# Patient Record
Sex: Male | Born: 1970 | Race: White | Hispanic: No | Marital: Single | Smoking: Current every day smoker
Health system: Southern US, Community
[De-identification: ages and names within clinical notes are randomized; demographics above are authoritative.]

## PROBLEM LIST (undated history)

## (undated) DIAGNOSIS — J449 Chronic obstructive pulmonary disease, unspecified: Secondary | ICD-10-CM

## (undated) DIAGNOSIS — I2699 Other pulmonary embolism without acute cor pulmonale: Secondary | ICD-10-CM

## (undated) DIAGNOSIS — G4730 Sleep apnea, unspecified: Secondary | ICD-10-CM

## (undated) DIAGNOSIS — I509 Heart failure, unspecified: Secondary | ICD-10-CM

## (undated) DIAGNOSIS — I82419 Acute embolism and thrombosis of unspecified femoral vein: Secondary | ICD-10-CM

## (undated) DIAGNOSIS — M1990 Unspecified osteoarthritis, unspecified site: Secondary | ICD-10-CM

## (undated) DIAGNOSIS — Z86718 Personal history of other venous thrombosis and embolism: Secondary | ICD-10-CM

## (undated) DIAGNOSIS — I499 Cardiac arrhythmia, unspecified: Secondary | ICD-10-CM

## (undated) HISTORY — DX: Other pulmonary embolism without acute cor pulmonale: I26.99

## (undated) HISTORY — DX: Heart failure, unspecified: I50.9

## (undated) HISTORY — DX: Acute embolism and thrombosis of unspecified femoral vein: I82.419

## (undated) HISTORY — DX: Sleep apnea, unspecified: G47.30

## (undated) HISTORY — PX: NO PAST SURGERIES: SHX2092

## (undated) HISTORY — DX: Cardiac arrhythmia, unspecified: I49.9

## (undated) HISTORY — DX: Chronic obstructive pulmonary disease, unspecified: J44.9

---

## 2018-07-08 ENCOUNTER — Encounter: Payer: Self-pay | Admitting: Emergency Medicine

## 2018-07-08 ENCOUNTER — Emergency Department
Admission: EM | Admit: 2018-07-08 | Discharge: 2018-07-08 | Disposition: A | Discharge status: Home / Self Care | Payer: Self-pay | Attending: Emergency Medicine | Admitting: Emergency Medicine

## 2018-07-08 ENCOUNTER — Other Ambulatory Visit: Payer: Self-pay

## 2018-07-08 ENCOUNTER — Emergency Department: Payer: Self-pay

## 2018-07-08 DIAGNOSIS — F17210 Nicotine dependence, cigarettes, uncomplicated: Secondary | ICD-10-CM | POA: Insufficient documentation

## 2018-07-08 DIAGNOSIS — M25562 Pain in left knee: Secondary | ICD-10-CM | POA: Insufficient documentation

## 2018-07-08 DIAGNOSIS — Z7901 Long term (current) use of anticoagulants: Secondary | ICD-10-CM | POA: Insufficient documentation

## 2018-07-08 MED ORDER — PREDNISONE 10 MG PO TABS: ORAL_TABLET | ORAL | 0 refills | Status: DC

## 2018-07-08 MED ORDER — HYDROCODONE-ACETAMINOPHEN 5-325 MG PO TABS
1.0000 | ORAL_TABLET | Freq: Once | ORAL | Status: AC
  Administered 2018-07-08: 1 via ORAL
  Filled 2018-07-08: qty 1

## 2018-07-08 MED ORDER — HYDROCODONE-ACETAMINOPHEN 5-325 MG PO TABS: 1.0000 | ORAL_TABLET | Freq: Four times a day (QID) | ORAL | 0 refills | Status: DC | PRN

## 2018-07-08 NOTE — Discharge Instructions (Signed)
Follow-up with Dr. Ernest Pine who is the orthopedist on call.  His contact information is listed on your discharge papers. Begin taking prednisone 3 tablets once a day for the next 4 days.  Also Norco 1 every 6 hours as needed for pain.  You may also use ice to your knee as needed for discomfort.  Use crutches for added support and protection to avoid injury to your knee.

## 2018-07-08 NOTE — ED Triage Notes (Signed)
Pt reports that his left knee began bothering him yesterday. He denies any injury to his knee. He states that when he tried to walk on it it feels like it is locking up and is bone on bone. Left knee more swollen than the right.

## 2018-07-08 NOTE — ED Notes (Signed)
See triage note  Presents with left knee pain  States pain started yesterday w/o injury  Pain is mainly to anterior   Min swelling noted

## 2018-07-08 NOTE — ED Provider Notes (Signed)
[facility] Emergency Department Provider Note  ____________________________________________   First MD Initiated Contact with Patient 07/08/18 1118     (approximate)  I have reviewed the triage vital signs and the nursing notes.   HISTORY  Chief Complaint Knee Pain   HPI Matthew [name] is a 47 y.o. male resents to the ED with complaint of left knee pain that began yesterday.  He denies any injury to his knee.  He states that it feels like it is "bone-on-bone" when he is walking.  He denies any other symptoms and states that the pain is more at his kneecap than any other place.  Patient has not taken any over-the-counter medication prior to arrival for his knee pain.  Rates his pain as 6 out of 10.   History reviewed. No pertinent past medical history.  There are no active problems to display for this patient.   History reviewed. No pertinent surgical history.  Prior to Admission medications   Medication Sig Start Date End Date Taking? Authorizing Provider  warfarin (COUMADIN) 5 MG tablet Take 5 mg by mouth daily.   Yes [provider]  HYDROcodone-acetaminophen (NORCO/VICODIN) 5-325 MG tablet Take 1 tablet by mouth every 6 (six) hours as needed for moderate pain. 07/08/18   Tommi Rumps, PA-C  predniSONE (DELTASONE) 10 MG tablet Take 3 tabs once a day for the next 4 days 07/08/18   Tommi Rumps, PA-C    Allergies Patient has no known allergies.  History reviewed. No pertinent family history.  Social History Social History   Tobacco Use  . Smoking status: Current Every Day Smoker    Packs/day: 0.50    Types: Cigarettes  Substance Use Topics  . Alcohol use: Yes    Comment: Occ  . Drug use: Never    Review of Systems Constitutional: No fever/chills Cardiovascular: Denies chest pain. Respiratory: Denies shortness of breath. Musculoskeletal: Positive left knee pain. Skin: Negative for rash. Neurological: Negative for  headaches, focal weakness or numbness. ____________________________________________   PHYSICAL EXAM:  VITAL SIGNS: ED Triage Vitals  Enc Vitals Group     BP 07/08/18 1112 119/69     Pulse Rate 07/08/18 1112 89     Resp 07/08/18 1112 18     Temp 07/08/18 1112 98.7 F (37.1 C)     Temp Source 07/08/18 1112 Oral     SpO2 07/08/18 1112 96 %     Weight 07/08/18 1113 (!) 304 lb (137.9 kg)     Height 07/08/18 1113 5\' 7"  (1.702 m)     Head Circumference --      Peak Flow --      Pain Score 07/08/18 1112 6     Pain Loc --      Pain Edu? --      Excl. in GC? --    Constitutional: Alert and oriented. Well appearing and in no acute distress. Eyes: Conjunctivae are normal.  Head: Atraumatic. Neck: No stridor.   Cardiovascular: Normal rate, regular rhythm. Grossly normal heart sounds.  Good peripheral circulation. Respiratory: Normal respiratory effort.  No retractions. Lungs CTAB. Musculoskeletal: Examination of left knee there is no gross deformity in comparison to the right knee there is no obvious swelling noted.  There is no erythema or warmth noted and Homans sign is negative.  Pulses present.  Skin is warm and dry.  No skin injury or discoloration.  No edema or effusion noted at the patella.  Range of motion is restricted  secondary to patient's pain.  Minimal crepitus is noted. Neurologic:  Normal speech and language. No gross focal neurologic deficits are appreciated.  Skin:  Skin is warm, dry and intact.  Psychiatric: Mood and affect are normal. Speech and behavior are normal.  ____________________________________________   LABS (all labs ordered are listed, but only abnormal results are displayed)  Labs Reviewed - No data to display  RADIOLOGY  ED MD interpretation: Left knee x-ray shows degenerative changes.  Official radiology report(s): Dg Knee Complete 4 Views Left  Result Date: 07/08/2018 CLINICAL DATA:  Left knee pain began yesterday, no acute injury, feels like  the knee is locking EXAM: LEFT KNEE - COMPLETE 4+ VIEW COMPARISON:  None. 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 Medially there is more loss of joint space. No fracture or joint effusion is seen. However, there is chondrocalcinosis present. 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 IMPRESSION: 1. No acute abnormality. 2. 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 3. 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 Electronically Signed   By: Dwyane Dee M.D.   On: 07/08/2018 11:35    ____________________________________________   PROCEDURES  Procedure(s) performed: Patient was given crutches by ED tech.  Procedures  Critical Care performed: No  ____________________________________________   INITIAL IMPRESSION / ASSESSMENT AND PLAN / ED COURSE  As part of my medical decision making, I reviewed the following data within the electronic MEDICAL RECORD NUMBER Notes from prior ED visits and [place] Controlled Substance Database  Patient presents to the ED with complaint of left knee pain that began yesterday.  Patient denies any injury to his knee.  He has not taken any over-the-counter medication for his pain.  Exam is unremarkable.  X-ray reveals degenerative changes with decreased space noted on the medial aspect.  Patient was made aware.  He was given a prescription for Norco for pain and also prednisone for inflammation as he is not to take anti-inflammatories.  He is encouraged to use ice to his knee as needed.  We discussed following up with an orthopedist if he continues to have problems with his knee.  He was given crutches for added support for walking and a note to remain out of work for the next 2  days.  ____________________________________________   FINAL CLINICAL IMPRESSION(S) / ED DIAGNOSES  Final diagnoses:  Acute pain of left knee     ED Discharge Orders         Ordered    HYDROcodone-acetaminophen (NORCO/VICODIN) 5-325 MG tablet  Every 6 hours PRN     07/08/18 1203    predniSONE (DELTASONE) 10 MG tablet     07/08/18 1203           Note:  This document was prepared using Dragon voice recognition software and may include unintentional dictation errors.    Tommi Rumps, PA-C 07/08/18 1250    Nita Sickle, MD 07/09/18 [phone]

## 2018-07-24 ENCOUNTER — Emergency Department
Admission: EM | Admit: 2018-07-24 | Discharge: 2018-07-24 | Disposition: A | Discharge status: Home / Self Care | Payer: Self-pay | Attending: Emergency Medicine | Admitting: Emergency Medicine

## 2018-07-24 ENCOUNTER — Encounter: Payer: Self-pay | Admitting: Emergency Medicine

## 2018-07-24 ENCOUNTER — Emergency Department: Payer: Self-pay

## 2018-07-24 DIAGNOSIS — R0981 Nasal congestion: Secondary | ICD-10-CM | POA: Insufficient documentation

## 2018-07-24 DIAGNOSIS — J40 Bronchitis, not specified as acute or chronic: Secondary | ICD-10-CM | POA: Insufficient documentation

## 2018-07-24 DIAGNOSIS — Z7901 Long term (current) use of anticoagulants: Secondary | ICD-10-CM | POA: Insufficient documentation

## 2018-07-24 DIAGNOSIS — F17210 Nicotine dependence, cigarettes, uncomplicated: Secondary | ICD-10-CM | POA: Insufficient documentation

## 2018-07-24 HISTORY — DX: Personal history of other venous thrombosis and embolism: Z86.718

## 2018-07-24 MED ORDER — GUAIFENESIN-CODEINE 100-10 MG/5ML PO SOLN: 5.0000 mL | Freq: Four times a day (QID) | ORAL | 0 refills | Status: DC | PRN

## 2018-07-24 MED ORDER — METHYLPREDNISOLONE 4 MG PO TBPK: ORAL_TABLET | ORAL | 0 refills | Status: DC

## 2018-07-24 MED ORDER — BENZONATATE 100 MG PO CAPS: 200.0000 mg | ORAL_CAPSULE | Freq: Three times a day (TID) | ORAL | 0 refills | Status: DC | PRN

## 2018-07-24 MED ORDER — HYDROCOD POLST-CPM POLST ER 10-8 MG/5ML PO SUER
5.0000 mL | Freq: Once | ORAL | Status: AC
  Administered 2018-07-24: 5 mL via ORAL
  Filled 2018-07-24: qty 5

## 2018-07-24 NOTE — ED Notes (Signed)
Says cough x 1 week productive yellow sputum.  Says he has a cpap with oxygen at home and has a nebulizer with albuterol.  Says he coughs more when lying down and when heat is on.  No distress now.

## 2018-07-24 NOTE — ED Provider Notes (Signed)
[facility] Emergency Department Provider Note   ____________________________________________   First MD Initiated Contact with Patient 07/24/18 1037     (approximate)  I have reviewed the triage vital signs and the nursing notes.   HISTORY  Chief Complaint Nasal Congestion and Cough    HPI Matthew [name] is a 47 y.o. male patient presents with cough for 1 week.  Patient had nasal and chest congestion.  Patient cough is yellow-greenish in color.  Patient is a hot sleep secondary to increased coughing at night.  Patient denies fever/chills.  Patient denies nausea, vomiting, or diarrhea.  Patient is a mild trans-relief with nebulizer treatment at home.  Past Medical History:  Diagnosis Date  . Hx of blood clots     There are no active problems to display for this patient.   History reviewed. No pertinent surgical history.  Prior to Admission medications   Medication Sig Start Date End Date Taking? Authorizing Provider  albuterol (ACCUNEB) 1.25 MG/3ML nebulizer solution Take 1 ampule by nebulization every 6 (six) hours as needed for wheezing.   Yes [provider]  HYDROcodone-acetaminophen (NORCO/VICODIN) 5-325 MG tablet Take 1 tablet by mouth every 6 (six) hours as needed for moderate pain. 07/08/18  Yes Tommi RumpsSummers, Rhonda L, PA-C  warfarin (COUMADIN) 5 MG tablet Take 5 mg by mouth 2 (two) times daily.    Yes [provider]  benzonatate (TESSALON PERLES) 100 MG capsule Take 2 capsules (200 mg total) by mouth 3 (three) times daily as needed. 07/24/18 07/24/19  Joni ReiningSmith, Ronald K, PA-C  guaiFENesin-codeine 100-10 MG/5ML syrup Take 5 mLs by mouth every 6 (six) hours as needed for cough. 07/24/18   Joni ReiningSmith, Ronald K, PA-C  methylPREDNISolone (MEDROL DOSEPAK) 4 MG TBPK tablet Take Tapered dose as directed 07/24/18   Joni ReiningSmith, Ronald K, PA-C  predniSONE (DELTASONE) 10 MG tablet Take 3 tabs once a day for the next 4 days 07/08/18   Tommi RumpsSummers, Rhonda L,  PA-C    Allergies Patient has no known allergies.  No family history on file.  Social History Social History   Tobacco Use  . Smoking status: Current Every Day Smoker    Packs/day: 0.50    Types: Cigarettes  Substance Use Topics  . Alcohol use: Yes    Comment: Occ  . Drug use: Never    Review of Systems Constitutional: No fever/chills Eyes: No visual changes. ENT: Nasal congestion. Cardiovascular: Denies chest pain. Respiratory: Denies shortness of breath.  Productive cough. Gastrointestinal: No abdominal pain.  No nausea, no vomiting.  No diarrhea.  No constipation. Genitourinary: Negative for dysuria. Musculoskeletal: Negative for back pain. Skin: Negative for rash. Neurological: Negative for headaches, focal weakness or numbness.   ____________________________________________   PHYSICAL EXAM:  VITAL SIGNS: ED Triage Vitals  Enc Vitals Group     BP 07/24/18 1032 104/60     Pulse Rate 07/24/18 1032 97     Resp --      Temp 07/24/18 1032 98.7 F (37.1 C)     Temp Source 07/24/18 1032 Oral     SpO2 07/24/18 1032 91 %     Weight 07/24/18 1029 (!) 309 lb (140.2 kg)     Height 07/24/18 1029 5\' 7"  (1.702 m)     Head Circumference --      Peak Flow --      Pain Score 07/24/18 1029 0     Pain Loc --      Pain Edu? --  Excl. in GC? --    Constitutional: Alert and oriented. Well appearing and in no acute distress.  Morbid obesity. Nose: Edematous nasal turbinates with thick rhinorrhea.   Mouth/Throat: Mucous membranes are moist.  Oropharynx non-erythematous.  Postnasal drainage. Neck: No stridor.  Hematological/Lymphatic/Immunilogical: No cervical lymphadenopathy. Cardiovascular: Normal rate, regular rhythm. Grossly normal heart sounds.  Good peripheral circulation. Respiratory: Normal respiratory effort.  No retractions. Lungs with rales and wheezing. Skin:  Skin is warm, dry and intact. No rash noted. Psychiatric: Mood and affect are normal. Speech and  behavior are normal.  ____________________________________________   LABS (all labs ordered are listed, but only abnormal results are displayed)  Labs Reviewed - No data to display ____________________________________________  EKG   ____________________________________________  RADIOLOGY  ED MD interpretation:    Official radiology report(s): Dg Chest 2 View  Result Date: 07/24/2018 CLINICAL DATA:  Cough and chest congestion over the last week. EXAM: CHEST - 2 VIEW COMPARISON:  None. FINDINGS: Heart and mediastinal shadows are normal. There is bronchial thickening. No infiltrate, collapse or effusion. No significant bone finding. IMPRESSION: Bronchitis pattern.  No consolidation or collapse. Electronically Signed   By: Paulina Fusi M.D.   On: 07/24/2018 11:12    ____________________________________________   PROCEDURES  Procedure(s) performed: None  Procedures  Critical Care performed: No  ____________________________________________   INITIAL IMPRESSION / ASSESSMENT AND PLAN / ED COURSE  As part of my medical decision making, I reviewed the following data within the electronic MEDICAL RECORD NUMBER    Patient presents with 1 week of productive cough.  Discussed chest x-ray finding with patient consistent with bronchitis.  Patient given discharge care instruction advised take medication as directed.  Patient advised to establish care with the [facility].      ____________________________________________   FINAL CLINICAL IMPRESSION(S) / ED DIAGNOSES  Final diagnoses:  Bronchitis     ED Discharge Orders         Ordered    benzonatate (TESSALON PERLES) 100 MG capsule  3 times daily PRN     07/24/18 1134    methylPREDNISolone (MEDROL DOSEPAK) 4 MG TBPK tablet     07/24/18 1134    guaiFENesin-codeine 100-10 MG/5ML syrup  Every 6 hours PRN     07/24/18 1134           Note:  This document was prepared using Dragon voice recognition software  and may include unintentional dictation errors.    Joni Reining, PA-C 07/24/18 1138    Governor Rooks, MD 07/24/18 [phone]

## 2018-07-24 NOTE — ED Triage Notes (Signed)
Pt reports a head cold for about a week. Pt reports cough and congestion. Pt reports cough is productive with yellow-green phlem. Pt reports hard to get sleep due to cough.

## 2018-07-28 ENCOUNTER — Encounter: Payer: Self-pay | Admitting: Emergency Medicine

## 2018-07-28 ENCOUNTER — Emergency Department: Payer: Self-pay

## 2018-07-28 ENCOUNTER — Other Ambulatory Visit: Payer: Self-pay

## 2018-07-28 ENCOUNTER — Emergency Department
Admission: EM | Admit: 2018-07-28 | Discharge: 2018-07-28 | Disposition: A | Discharge status: Home / Self Care | Payer: Self-pay | Attending: Emergency Medicine | Admitting: Emergency Medicine

## 2018-07-28 DIAGNOSIS — R5383 Other fatigue: Secondary | ICD-10-CM | POA: Insufficient documentation

## 2018-07-28 DIAGNOSIS — R059 Cough, unspecified: Secondary | ICD-10-CM

## 2018-07-28 DIAGNOSIS — R05 Cough: Secondary | ICD-10-CM | POA: Insufficient documentation

## 2018-07-28 DIAGNOSIS — F17210 Nicotine dependence, cigarettes, uncomplicated: Secondary | ICD-10-CM | POA: Insufficient documentation

## 2018-07-28 DIAGNOSIS — Z7901 Long term (current) use of anticoagulants: Secondary | ICD-10-CM | POA: Insufficient documentation

## 2018-07-28 LAB — URINALYSIS, COMPLETE (UACMP) WITH MICROSCOPIC
BACTERIA UA: NONE SEEN
BILIRUBIN URINE: NEGATIVE
Glucose, UA: NEGATIVE mg/dL
Hgb urine dipstick: NEGATIVE
Ketones, ur: NEGATIVE mg/dL
Leukocytes, UA: NEGATIVE
NITRITE: NEGATIVE
Protein, ur: NEGATIVE mg/dL
SPECIFIC GRAVITY, URINE: 1.006 (ref 1.005–1.030)
pH: 6 (ref 5.0–8.0)

## 2018-07-28 LAB — CBC WITH DIFFERENTIAL/PLATELET
Abs Immature Granulocytes: 0.06 10*3/uL (ref 0.00–0.07)
BASOS ABS: 0 10*3/uL (ref 0.0–0.1)
Basophils Relative: 0 %
EOS ABS: 0.2 10*3/uL (ref 0.0–0.5)
EOS PCT: 2 %
HCT: 52.4 % — ABNORMAL HIGH (ref 39.0–52.0)
HEMOGLOBIN: 17 g/dL (ref 13.0–17.0)
Immature Granulocytes: 1 %
LYMPHS ABS: 3.3 10*3/uL (ref 0.7–4.0)
LYMPHS PCT: 32 %
MCH: 32.6 pg (ref 26.0–34.0)
MCHC: 32.4 g/dL (ref 30.0–36.0)
MCV: 100.4 fL — ABNORMAL HIGH (ref 80.0–100.0)
Monocytes Absolute: 1 10*3/uL (ref 0.1–1.0)
Monocytes Relative: 9 %
Neutro Abs: 6 10*3/uL (ref 1.7–7.7)
Neutrophils Relative %: 56 %
Platelets: 205 10*3/uL (ref 150–400)
RBC: 5.22 MIL/uL (ref 4.22–5.81)
RDW: 15.3 % (ref 11.5–15.5)
WBC: 10.5 10*3/uL (ref 4.0–10.5)
nRBC: 0.2 % (ref 0.0–0.2)

## 2018-07-28 LAB — COMPREHENSIVE METABOLIC PANEL
ALBUMIN: 3.1 g/dL — AB (ref 3.5–5.0)
ALT: 17 U/L (ref 0–44)
AST: 23 U/L (ref 15–41)
Alkaline Phosphatase: 126 U/L (ref 38–126)
Anion gap: 5 (ref 5–15)
BUN: 11 mg/dL (ref 6–20)
CALCIUM: 8.3 mg/dL — AB (ref 8.9–10.3)
CHLORIDE: 102 mmol/L (ref 98–111)
CO2: 35 mmol/L — AB (ref 22–32)
CREATININE: 0.7 mg/dL (ref 0.61–1.24)
GFR calc non Af Amer: >60 mL/min (ref >60)
GLUCOSE: 102 mg/dL — AB (ref 70–99)
Potassium: 5 mmol/L (ref 3.5–5.1)
SODIUM: 142 mmol/L (ref 135–145)
Total Bilirubin: 0.4 mg/dL (ref 0.3–1.2)
Total Protein: 7.6 g/dL (ref 6.5–8.1)

## 2018-07-28 MED ORDER — BENZONATATE 100 MG PO CAPS: 100.0000 mg | ORAL_CAPSULE | Freq: Four times a day (QID) | ORAL | 0 refills | Status: DC | PRN

## 2018-07-28 MED ORDER — BENZONATATE 100 MG PO CAPS
100.0000 mg | ORAL_CAPSULE | Freq: Once | ORAL | Status: AC
  Administered 2018-07-28: 100 mg via ORAL
  Filled 2018-07-28 (×2): qty 1

## 2018-07-28 NOTE — ED Notes (Signed)
Pharmacy to send up medication in about 5 minutes

## 2018-07-28 NOTE — ED Triage Notes (Signed)
Pt in via POV, reports fatigue x approximately 2 weeks, reports being seen here, dx with Bronchitis, prescribed Zpack without any relief.  Pt with low grade fever, other vitals WDL.  NAD noted at this time.

## 2018-07-28 NOTE — Discharge Instructions (Addendum)
Please seek medical attention for any high fevers, chest pain, shortness of breath, change in behavior, persistent vomiting, bloody stool or any other new or concerning symptoms.  

## 2018-07-28 NOTE — ED Notes (Signed)
Pt presents to ED via POV, pt states was seen 2 weeks ago for same, prescribed cough medicine without relief. Pt states took several nebs last night without relief. Pt c/o fatigue, cough, coughing so much he vomits. Pt with strong productive cough noted during assessment.

## 2018-07-28 NOTE — ED Provider Notes (Signed)
[facility] Emergency Department Provider Note   ____________________________________________   I have reviewed the triage vital signs and the nursing notes.   HISTORY  Chief Complaint Cough  History limited by: Not Limited   HPI Matthew [name] is a 47 y.o. male who presents to the emergency department today because of concerns for continued cough, fatigue.  Patient was recently seen in the emergency department and diagnosed with URI.  He states that since leaving the emergency department he has not felt any better.  He has been taking the Z-Pak as prescribed.  Was not able to get cough medication filled.  Patient states that he does get some back pain with his cough.  He has been trying breathing treatments at home with some relief when he breaks up the congestion.  Patient denies any fevers.   Per medical record review patient has a history of recent ER visit with CXR consistent with bronchitis.   Past Medical History:  Diagnosis Date  . Hx of blood clots     There are no active problems to display for this patient.   History reviewed. No pertinent surgical history.  Prior to Admission medications   Medication Sig Start Date End Date Taking? Authorizing Provider  albuterol (ACCUNEB) 1.25 MG/3ML nebulizer solution Take 1 ampule by nebulization every 6 (six) hours as needed for wheezing.    [provider]  benzonatate (TESSALON PERLES) 100 MG capsule Take 2 capsules (200 mg total) by mouth 3 (three) times daily as needed. 07/24/18 07/24/19  Joni Reining, PA-C  guaiFENesin-codeine 100-10 MG/5ML syrup Take 5 mLs by mouth every 6 (six) hours as needed for cough. 07/24/18   Joni Reining, PA-C  HYDROcodone-acetaminophen (NORCO/VICODIN) 5-325 MG tablet Take 1 tablet by mouth every 6 (six) hours as needed for moderate pain. 07/08/18   Tommi Rumps, PA-C  methylPREDNISolone (MEDROL DOSEPAK) 4 MG TBPK tablet Take Tapered dose as directed  07/24/18   Joni Reining, PA-C  predniSONE (DELTASONE) 10 MG tablet Take 3 tabs once a day for the next 4 days 07/08/18   Tommi Rumps, PA-C  warfarin (COUMADIN) 5 MG tablet Take 5 mg by mouth 2 (two) times daily.     [provider]    Allergies Patient has no known allergies.  No family history on file.  Social History Social History   Tobacco Use  . Smoking status: Current Every Day Smoker    Packs/day: 0.50    Types: Cigarettes  . Smokeless tobacco: Never Used  Substance Use Topics  . Alcohol use: Yes    Comment: occassional  . Drug use: Never    Review of Systems Constitutional: No fever/chills Eyes: No visual changes. ENT: No sore throat. Cardiovascular: Positive chest pain. Respiratory: Positive shortness of breath. Gastrointestinal: No abdominal pain.  No nausea, no vomiting.  No diarrhea.   Genitourinary: Negative for dysuria. Musculoskeletal: Positive for back pain. Skin: Negative for rash. Neurological: Negative for headaches, focal weakness or numbness.  ____________________________________________   PHYSICAL EXAM:  VITAL SIGNS: ED Triage Vitals  Enc Vitals Group     BP 07/28/18 1654 122/86     Pulse Rate 07/28/18 1654 97     Resp --      Temp 07/28/18 1654 99.7 F (37.6 C)     Temp Source 07/28/18 1654 Oral     SpO2 07/28/18 1654 91 %     Weight 07/28/18 1655 (!) 305 lb (138.3 kg)  Height 07/28/18 1655 5\' 7"  (1.702 m)     Head Circumference --      Peak Flow --      Pain Score 07/28/18 1659 6   Constitutional: Alert and oriented.  Eyes: Conjunctivae are normal.  ENT      Head: Normocephalic and atraumatic.      Nose: No congestion/rhinnorhea.      Mouth/Throat: Mucous membranes are moist.      Neck: No stridor. Hematological/Lymphatic/Immunilogical: No cervical lymphadenopathy. Cardiovascular: Normal rate, regular rhythm.  No murmurs, rubs, or gallops. Respiratory: Normal respiratory effort without tachypnea nor  retractions. Diffuse expiratory wheezing.  Gastrointestinal: Soft and non tender. No rebound. No guarding.  Genitourinary: Deferred Musculoskeletal: Normal range of motion in all extremities. No lower extremity edema. Neurologic:  Normal speech and language. No gross focal neurologic deficits are appreciated.  Skin:  Skin is warm, dry and intact. No rash noted. Psychiatric: Mood and affect are normal. Speech and behavior are normal. Patient exhibits appropriate insight and judgment.  ____________________________________________    LABS (pertinent positives/negatives)  CMP na 142, k 5.0, glu 102, cr 0.70, ca 8.3 UA hazy otherwise unremarkable CBC wbc 10.5, hgb 17.0, plt 205 ____________________________________________   EKG  None  ____________________________________________    RADIOLOGY  CXR Scattered diffuse peribronchial thickening.  ____________________________________________   PROCEDURES  Procedures  ____________________________________________   INITIAL IMPRESSION / ASSESSMENT AND PLAN / ED COURSE  Pertinent labs & imaging results that were available during my care of the patient were reviewed by me and considered in my medical decision making (see chart for details).   Patient presented to the emergency department today because of concerns for continued cough and some fatigue.  Patient with recent ER visit with diagnosis of bronchitis.  On exam today patient does have diffuse wheezing.  Chest x-ray without any obvious pneumonia.  Did discuss bronchitis with the patient.  Will give patient a prescription for cough medication.  Patient states he has prednisone at home.  I did discuss with patient that he could try taking it for the next couple of days.  Will give patient primary care follow-up information.   ____________________________________________   FINAL CLINICAL IMPRESSION(S) / ED DIAGNOSES  Final diagnoses:  Cough  Other fatigue     Note: This  dictation was prepared with Dragon dictation. Any transcriptional errors that result from this process are unintentional     Phineas SemenGoodman, Yasmyn Bellisario, MD 07/28/18 [phone]

## 2018-08-05 ENCOUNTER — Ambulatory Visit: Payer: Self-pay | Admitting: Pharmacy Technician

## 2018-08-05 DIAGNOSIS — Z79899 Other long term (current) drug therapy: Secondary | ICD-10-CM

## 2018-08-05 NOTE — Progress Notes (Signed)
Called patient.  Completed [facility] application and read contract.  Patient agreed to all terms of the [facility] contract.  Mailing to patient to sign and date and [facility].  Patient to provide SSI statement from [place].  Provided patient with Community education officercommunity resource material based on his particular needs.    [facility]  Sherilyn DacostaBetty J. Zaydon Kinser Care Manager [facility]

## 2018-08-21 ENCOUNTER — Ambulatory Visit: Payer: Self-pay | Admitting: Gerontology

## 2018-08-21 ENCOUNTER — Encounter: Payer: Self-pay | Admitting: Gerontology

## 2018-08-21 VITALS — BP 122/82 | HR 74 | Wt 304.0 lb

## 2018-08-21 DIAGNOSIS — M79671 Pain in right foot: Secondary | ICD-10-CM

## 2018-08-21 DIAGNOSIS — Z0000 Encounter for general adult medical examination without abnormal findings: Secondary | ICD-10-CM

## 2018-08-21 DIAGNOSIS — I825Y2 Chronic embolism and thrombosis of unspecified deep veins of left proximal lower extremity: Secondary | ICD-10-CM

## 2018-08-21 DIAGNOSIS — M79672 Pain in left foot: Secondary | ICD-10-CM

## 2018-08-21 LAB — POCT INR: INR: 1.2 — AB (ref 2.0–3.0)

## 2018-08-21 MED ORDER — WARFARIN SODIUM 5 MG PO TABS: 5.0000 mg | ORAL_TABLET | Freq: Two times a day (BID) | ORAL | 0 refills | Status: DC

## 2018-08-21 NOTE — Progress Notes (Signed)
Patient: Matthew [name] Male    DOB: [birth year]   47 y.o.   MRN: [id number] Visit Date: 08/21/2018  Today's Provider: Langston Reusing, NP   Chief Complaint  Patient presents with  . New Patient (Initial Visit)    establish primary care   Subjective:    HPI Matthew [name] 47 y/o male presents for initial office visit and discuss about his history of DVT to left lower leg. He stated that he was diagnosed with PE and left lower leg DVT in [place] in 2018. He reports taking 5 mg of coumadin twice daily, and his INR has not being checked in 3 months. He denies hematuria, bruises or dark tarry stool. He reports edema to left lower leg, denies claudication, chest pain, palpitation,shortness of breath. He reports smoking 1 pack of cigarette in 2 days. He admits to being diagnosed with sleep apnea and uses Cpap with 3 L of oxygen at bedtime.  He c/o 6/10 dull intermittent pain to bilateral heel with walking, that has being going on for 4 months. He reports that wearing poor fitting shoes might worsens the heel pain.    No Known Allergies Previous Medications   ALBUTEROL (ACCUNEB) 1.25 MG/3ML NEBULIZER SOLUTION    Take 1 ampule by nebulization every 6 (six) hours as needed for wheezing.   BENZONATATE (TESSALON PERLES) 100 MG CAPSULE    Take 2 capsules (200 mg total) by mouth 3 (three) times daily as needed.   BENZONATATE (TESSALON PERLES) 100 MG CAPSULE    Take 1 capsule (100 mg total) by mouth every 6 (six) hours as needed for cough.   GUAIFENESIN-CODEINE 100-10 MG/5ML SYRUP    Take 5 mLs by mouth every 6 (six) hours as needed for cough.   HYDROCODONE-ACETAMINOPHEN (NORCO/VICODIN) 5-325 MG TABLET    Take 1 tablet by mouth every 6 (six) hours as needed for moderate pain.   METHYLPREDNISOLONE (MEDROL DOSEPAK) 4 MG TBPK TABLET    Take Tapered dose as directed   PREDNISONE (DELTASONE) 10 MG TABLET    Take 3 tabs once a day for the next 4 days    Review of Systems  Constitutional:  Negative.   HENT: Negative.   Eyes: Negative.   Respiratory: Negative.   Cardiovascular: Leg swelling: left leg.  Gastrointestinal: Negative.   Endocrine: Negative.   Genitourinary: Negative.   Musculoskeletal: Arthralgias: heel pain.  Skin: Negative.   Neurological: Negative.   Hematological: Negative.   Psychiatric/Behavioral: Negative.     Social History   Tobacco Use  . Smoking status: Current Every Day Smoker    Packs/day: 0.50    Types: Cigarettes  . Smokeless tobacco: Never Used  Substance Use Topics  . Alcohol use: Yes    Comment: occassional   Objective:   BP 122/82 (BP Location: Left Wrist, Patient Position: Sitting)   Pulse 74   Wt (!) 304 lb (137.9 kg)   SpO2 94%   BMI 47.61 kg/m   Physical Exam Constitutional:      Appearance: He is obese.  HENT:     Head: Normocephalic and atraumatic.     Mouth/Throat:     Mouth: Mucous membranes are moist.  Eyes:     Extraocular Movements: Extraocular movements intact.     Pupils: Pupils are equal, round, and reactive to light.  Neck:     Musculoskeletal: Normal range of motion.  Cardiovascular:     Rate and Rhythm: Normal rate and regular rhythm.     Pulses: Normal pulses.  Heart sounds: Normal heart sounds.  Pulmonary:     Effort: Pulmonary effort is normal.     Breath sounds: Normal breath sounds.  Abdominal:     General: Bowel sounds are normal.  Musculoskeletal: Normal range of motion.     Left lower leg: Left lower leg edema: +1 edema left lower leg.  Skin:    General: Skin is warm and dry.  Neurological:     General: No focal deficit present.     Mental Status: He is alert and oriented to person, place, and time.  Psychiatric:        Mood and Affect: Mood normal.        Behavior: Behavior normal.        Thought Content: Thought content normal.        Judgment: Judgment normal.         Assessment & Plan:         1. 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 Morbid obesity ([place]) - Advised on low carb diet, exercise 30 minute daily.  3. Heel pain, bilateral - He was advised to wear proper fitting shoes  4. Health care maintenance  - CBC w/Diff; Future - Comp Met (CMET); Future - Lipid Profile; Future - TSH; Future - HgB A1c; Future - Urinalysis; Future - Follow up in 1 week    [place], NP   [facility]

## 2018-08-28 ENCOUNTER — Emergency Department: Payer: Self-pay

## 2018-08-28 ENCOUNTER — Other Ambulatory Visit: Payer: Self-pay

## 2018-08-28 ENCOUNTER — Encounter: Payer: Self-pay | Admitting: Emergency Medicine

## 2018-08-28 ENCOUNTER — Emergency Department
Admission: EM | Admit: 2018-08-28 | Discharge: 2018-08-28 | Disposition: A | Discharge status: Home / Self Care | Payer: Self-pay | Attending: Emergency Medicine | Admitting: Emergency Medicine

## 2018-08-28 ENCOUNTER — Ambulatory Visit: Payer: Self-pay | Admitting: Gerontology

## 2018-08-28 DIAGNOSIS — I82432 Acute embolism and thrombosis of left popliteal vein: Secondary | ICD-10-CM | POA: Insufficient documentation

## 2018-08-28 DIAGNOSIS — R0902 Hypoxemia: Secondary | ICD-10-CM | POA: Insufficient documentation

## 2018-08-28 DIAGNOSIS — I82402 Acute embolism and thrombosis of unspecified deep veins of left lower extremity: Secondary | ICD-10-CM

## 2018-08-28 DIAGNOSIS — F17210 Nicotine dependence, cigarettes, uncomplicated: Secondary | ICD-10-CM | POA: Insufficient documentation

## 2018-08-28 LAB — CBC WITH DIFFERENTIAL/PLATELET
Abs Immature Granulocytes: 0.03 10*3/uL (ref 0.00–0.07)
Basophils Absolute: 0.1 10*3/uL (ref 0.0–0.1)
Basophils Relative: 1 %
EOS PCT: 2 %
Eosinophils Absolute: 0.2 10*3/uL (ref 0.0–0.5)
HEMATOCRIT: 52.9 % — AB (ref 39.0–52.0)
HEMOGLOBIN: 17.1 g/dL — AB (ref 13.0–17.0)
Immature Granulocytes: 0 %
LYMPHS ABS: 3.1 10*3/uL (ref 0.7–4.0)
LYMPHS PCT: 31 %
MCH: 32 pg (ref 26.0–34.0)
MCHC: 32.3 g/dL (ref 30.0–36.0)
MCV: 98.9 fL (ref 80.0–100.0)
MONOS PCT: 7 %
Monocytes Absolute: 0.7 10*3/uL (ref 0.1–1.0)
Neutro Abs: 6 10*3/uL (ref 1.7–7.7)
Neutrophils Relative %: 59 %
Platelets: 194 10*3/uL (ref 150–400)
RBC: 5.35 MIL/uL (ref 4.22–5.81)
RDW: 14.5 % (ref 11.5–15.5)
WBC: 10.1 10*3/uL (ref 4.0–10.5)
nRBC: 0 % (ref 0.0–0.2)

## 2018-08-28 LAB — COMPREHENSIVE METABOLIC PANEL
ALBUMIN: 3.5 g/dL (ref 3.5–5.0)
ALK PHOS: 101 U/L (ref 38–126)
ALT: 13 U/L (ref 0–44)
ANION GAP: 7 (ref 5–15)
AST: 23 U/L (ref 15–41)
BILIRUBIN TOTAL: 0.8 mg/dL (ref 0.3–1.2)
BUN: 9 mg/dL (ref 6–20)
CALCIUM: 8.6 mg/dL — AB (ref 8.9–10.3)
CO2: 30 mmol/L (ref 22–32)
Chloride: 100 mmol/L (ref 98–111)
Creatinine, Ser: 0.6 mg/dL — ABNORMAL LOW (ref 0.61–1.24)
GFR calc Af Amer: >60 mL/min (ref >60)
GFR calc non Af Amer: >60 mL/min (ref >60)
GLUCOSE: 108 mg/dL — AB (ref 70–99)
Potassium: 4.2 mmol/L (ref 3.5–5.1)
Sodium: 137 mmol/L (ref 135–145)
TOTAL PROTEIN: 8.1 g/dL (ref 6.5–8.1)

## 2018-08-28 LAB — PROTIME-INR
INR: 1.05
Prothrombin Time: 13.6 seconds (ref 11.4–15.2)

## 2018-08-28 MED ORDER — ENOXAPARIN SODIUM 30 MG/0.3ML ~~LOC~~ SOLN: 1.0000 mg/kg | Freq: Two times a day (BID) | SUBCUTANEOUS | 0 refills | Status: DC

## 2018-08-28 MED ORDER — IOPAMIDOL (ISOVUE-370) INJECTION 76%
75.0000 mL | Freq: Once | INTRAVENOUS | Status: AC | PRN
  Administered 2018-08-28: 75 mL via INTRAVENOUS

## 2018-08-28 MED ORDER — WARFARIN SODIUM 5 MG PO TABS: 5.0000 mg | ORAL_TABLET | Freq: Every day | ORAL | 11 refills | Status: DC

## 2018-08-28 MED ORDER — WARFARIN SODIUM 5 MG PO TABS
5.0000 mg | ORAL_TABLET | Freq: Once | ORAL | Status: AC
  Administered 2018-08-28: 5 mg via ORAL
  Filled 2018-08-28: qty 1

## 2018-08-28 MED ORDER — WARFARIN - PHYSICIAN DOSING INPATIENT
Freq: Every day | Status: DC
  Filled 2018-08-28: qty 1

## 2018-08-28 MED ORDER — ENOXAPARIN SODIUM 150 MG/ML ~~LOC~~ SOLN
1.0000 mg/kg | Freq: Once | SUBCUTANEOUS | Status: AC
  Administered 2018-08-28: 140 mg via SUBCUTANEOUS
  Filled 2018-08-28: qty 0.92

## 2018-08-28 MED ORDER — HYDROMORPHONE HCL 1 MG/ML IJ SOLN
1.0000 mg | Freq: Once | INTRAMUSCULAR | Status: AC
  Administered 2018-08-28: 1 mg via INTRAMUSCULAR
  Filled 2018-08-28: qty 1

## 2018-08-28 NOTE — ED Notes (Signed)
Pharmacy contacted to send warfarin to ed

## 2018-08-28 NOTE — ED Provider Notes (Addendum)
[facility] Emergency Department Provider Note       Time seen: ----------------------------------------- 10:30 AM on 08/28/2018 -----------------------------------------   I have reviewed the triage vital signs and the nursing notes.  HISTORY   Chief Complaint Leg Pain    HPI Matthew [name] is a 47 y.o. male with a history of blood clots and sleep apnea who presents to the ED for left buttock and hamstring pain.  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  He does a lot of squatting and lifting on his job, denies any fevers, chills or other complaints.  Patient is concerned it is a blood clot.  Past Medical History:  Diagnosis Date  . Hx of blood clots   . Sleep apnea     There are no active problems to display for this patient.   History reviewed. No pertinent surgical history.  Allergies Patient has no known allergies.  Social History Social History   Tobacco Use  . Smoking status: Current Every Day Smoker    Packs/day: 0.50    Types: Cigarettes  . Smokeless tobacco: Never Used  Substance Use Topics  . Alcohol use: Yes    Comment: occassional  . Drug use: Never   Review of Systems Constitutional: Negative for fever. Cardiovascular: Negative for chest pain. Respiratory: Negative for shortness of breath. Gastrointestinal: Negative for abdominal pain, vomiting and diarrhea. Musculoskeletal: Positive for left leg pain Skin: Negative for rash. Neurological: Negative for headaches, focal weakness or numbness.  All systems negative/normal/unremarkable except as stated in the HPI  ____________________________________________   PHYSICAL EXAM:  VITAL SIGNS: ED Triage Vitals  Enc Vitals Group     BP 08/28/18 1010 111/69     Pulse Rate 08/28/18 1010 82     Resp 08/28/18 1010 16     Temp 08/28/18 1010 98.1 F (36.7 C)     Temp Source 08/28/18 1010 Oral   SpO2 08/28/18 1010 96 %     Weight 08/28/18 1011 (!) 304 lb 0.2 oz (137.9 kg)     Height --      Head Circumference --      Peak Flow --      Pain Score 08/28/18 1010 10     Pain Loc --      Pain Edu? --      Excl. in GC? --    Constitutional: Alert and oriented. Well appearing and in no distress. Musculoskeletal: Mild tenderness in the inferior buttock region on the left leg and upper hamstring area.  Pain with range of motion of the left lower extremity Neurologic:  Normal speech and language. No gross focal neurologic deficits are appreciated.  Skin:  Skin is warm, dry and intact. No rash noted. Psychiatric: Mood and affect are normal. Speech and behavior are normal.  ____________________________________________  ED COURSE:  As part of my medical decision making, I reviewed the following data within the electronic MEDICAL RECORD NUMBER History obtained from family if available, nursing notes, old chart and ekg, as well as notes from prior ED visits. Patient presented for left leg pain, we will assess with labs and imaging as indicated at this time.   Procedures ____________________________________________   RADIOLOGY Images were viewed by me  Left lower extremity ultrasound IMPRESSION: Positive for thrombus localized to the left popliteal vein. Although some of this thrombus may be chronic based on echogenic appearance, there is felt to likely be  an acute component of thrombus as well. No other thrombus is identified. The calf veins are not adequately assessed on this study due to poor visualization. CTA chest IMPRESSION: No evidence of pulmonary embolus.  Dilated central pulmonary arteries compatible with pulmonary arterial hypertension.  No acute cardiopulmonary disease.  ____________________________________________  DIFFERENTIAL DIAGNOSIS   Muscle strain, DVT, cellulitis  FINAL ASSESSMENT AND PLAN  Muscle strain, DVT acute on chronic   Plan: The patient  had presented for left leg pain. Patient's imaging possibly appeared acute on chronic thrombus.  Patient was given a shot of Lovenox here and an extra dose of Coumadin as his INR is severely subtherapeutic.  Will place him on Lovenox for a week until he is therapeutic on his Coumadin.  Patient did have some intermittent hypoxia of uncertain etiology.  CT was negative for PE.  He may have pulmonary artery hypertension.   Ulice DashJohnathan E Williams, MD   Note: This note was generated in part or whole with voice recognition software. Voice recognition is usually quite accurate but there are transcription errors that can and very often do occur. I apologize for any typographical errors that were not detected and corrected.     Emily FilbertWilliams, Jonathan E, MD 08/28/18 1356    Emily FilbertWilliams, Jonathan E, MD 08/28/18 [phone]

## 2018-08-28 NOTE — ED Notes (Signed)
Pt cleared by MD to have po fluids. Pt given water by RN

## 2018-08-28 NOTE — ED Triage Notes (Addendum)
Says pain behind left thigh that increases with any movement since yesterday.  Denies injury.  Says he is concerned it is a blood clot.

## 2018-08-28 NOTE — ED Notes (Signed)
Pt 02 declining down into the 80's. Pt does not want oxygen applied and states he feels fine and does not need it

## 2018-08-28 NOTE — ED Notes (Signed)
First Nurse Note: [facility], ambulatory at the scene.  BP 124/69, HR 85, Ox sat in the 90's.  Hx of blood clots in legs and pulmonary - last episode 2 years ago.  Alert, sitting upright in WC.  EMS states he called EMS because he would have pain with bending his leg in POV.

## 2018-09-01 ENCOUNTER — Telehealth: Payer: Self-pay | Admitting: Pharmacy Technician

## 2018-09-01 NOTE — Telephone Encounter (Signed)
Patient approved to receive medication assistance at [facility] as long as eligibility criteria continues to be met.  [facility]

## 2018-09-09 ENCOUNTER — Ambulatory Visit: Payer: Self-pay | Admitting: Gerontology

## 2018-09-10 ENCOUNTER — Other Ambulatory Visit: Payer: Self-pay

## 2018-09-10 DIAGNOSIS — I825Y2 Chronic embolism and thrombosis of unspecified deep veins of left proximal lower extremity: Secondary | ICD-10-CM

## 2018-09-10 DIAGNOSIS — Z0000 Encounter for general adult medical examination without abnormal findings: Secondary | ICD-10-CM

## 2018-09-10 LAB — POCT INR: INR: 2.3 (ref 2.0–3.0)

## 2018-09-10 NOTE — Progress Notes (Signed)
Matthew [name] stated that he didn't take Lovenox injection that was prescribed during his visit to the ED on 08/29/2019 for dvt. He was advised to take an extra dose of 5 mg of coumadin once, and PT/INR recheck on 09/17/2018.

## 2018-09-11 LAB — COMPREHENSIVE METABOLIC PANEL
ALK PHOS: 160 IU/L — AB (ref 39–117)
ALT: 13 IU/L (ref 0–44)
AST: 17 IU/L (ref 0–40)
Albumin/Globulin Ratio: 1 — ABNORMAL LOW (ref 1.2–2.2)
Albumin: 3.6 g/dL (ref 3.5–5.5)
BILIRUBIN TOTAL: 0.2 mg/dL (ref 0.0–1.2)
BUN/Creatinine Ratio: 11 (ref 9–20)
BUN: 8 mg/dL (ref 6–24)
CO2: 32 mmol/L — ABNORMAL HIGH (ref 20–29)
Calcium: 9.8 mg/dL (ref 8.7–10.2)
Chloride: 96 mmol/L (ref 96–106)
Creatinine, Ser: 0.74 mg/dL — ABNORMAL LOW (ref 0.76–1.27)
GFR calc Af Amer: 126 mL/min/{1.73_m2} (ref >59)
GFR calc non Af Amer: 109 mL/min/{1.73_m2} (ref >59)
Globulin, Total: 3.5 g/dL (ref 1.5–4.5)
Glucose: 76 mg/dL (ref 65–99)
Potassium: 4.5 mmol/L (ref 3.5–5.2)
Sodium: 143 mmol/L (ref 134–144)
Total Protein: 7.1 g/dL (ref 6.0–8.5)

## 2018-09-11 LAB — CBC WITH DIFFERENTIAL/PLATELET
Basophils Absolute: 0.1 10*3/uL (ref 0.0–0.2)
Basos: 1 %
EOS (ABSOLUTE): 0.3 10*3/uL (ref 0.0–0.4)
Eos: 3 %
Hematocrit: 51.3 % — ABNORMAL HIGH (ref 37.5–51.0)
Hemoglobin: 17.8 g/dL — ABNORMAL HIGH (ref 13.0–17.7)
Immature Grans (Abs): 0 10*3/uL (ref 0.0–0.1)
Immature Granulocytes: 0 %
LYMPHS ABS: 3.6 10*3/uL — AB (ref 0.7–3.1)
Lymphs: 31 %
MCH: 32.6 pg (ref 26.6–33.0)
MCHC: 34.7 g/dL (ref 31.5–35.7)
MCV: 94 fL (ref 79–97)
Monocytes Absolute: 1 10*3/uL — ABNORMAL HIGH (ref 0.1–0.9)
Monocytes: 9 %
Neutrophils Absolute: 6.5 10*3/uL (ref 1.4–7.0)
Neutrophils: 56 %
Platelets: 233 10*3/uL (ref 150–450)
RBC: 5.46 x10E6/uL (ref 4.14–5.80)
RDW: 13 % (ref 11.6–15.4)
WBC: 11.5 10*3/uL — AB (ref 3.4–10.8)

## 2018-09-11 LAB — TSH: TSH: 3.65 u[IU]/mL (ref 0.450–4.500)

## 2018-09-11 LAB — HEMOGLOBIN A1C
Est. average glucose Bld gHb Est-mCnc: 123 mg/dL
Hgb A1c MFr Bld: 5.9 % — ABNORMAL HIGH (ref 4.8–5.6)

## 2018-09-11 LAB — LIPID PANEL
Chol/HDL Ratio: 4.1 ratio (ref 0.0–5.0)
Cholesterol, Total: 155 mg/dL (ref 100–199)
HDL: 38 mg/dL — AB (ref >39)
LDL Calculated: 84 mg/dL (ref 0–99)
TRIGLYCERIDES: 163 mg/dL — AB (ref 0–149)
VLDL Cholesterol Cal: 33 mg/dL (ref 5–40)

## 2018-09-11 LAB — URINALYSIS
Bilirubin, UA: NEGATIVE
Glucose, UA: NEGATIVE
Ketones, UA: NEGATIVE
LEUKOCYTES UA: NEGATIVE
Nitrite, UA: NEGATIVE
Protein, UA: NEGATIVE
RBC, UA: NEGATIVE
Specific Gravity, UA: 1.008 (ref 1.005–1.030)
Urobilinogen, Ur: 0.2 mg/dL (ref 0.2–1.0)
pH, UA: 5.5 (ref 5.0–7.5)

## 2018-09-13 ENCOUNTER — Other Ambulatory Visit: Payer: Self-pay

## 2018-09-13 ENCOUNTER — Emergency Department
Admission: EM | Admit: 2018-09-13 | Discharge: 2018-09-13 | Disposition: A | Discharge status: Home / Self Care | Payer: Self-pay | Attending: Emergency Medicine | Admitting: Emergency Medicine

## 2018-09-13 ENCOUNTER — Encounter: Payer: Self-pay | Admitting: Emergency Medicine

## 2018-09-13 DIAGNOSIS — F17210 Nicotine dependence, cigarettes, uncomplicated: Secondary | ICD-10-CM | POA: Insufficient documentation

## 2018-09-13 DIAGNOSIS — M5441 Lumbago with sciatica, right side: Secondary | ICD-10-CM | POA: Insufficient documentation

## 2018-09-13 MED ORDER — BACLOFEN 5 MG PO TABS: 1.0000 | ORAL_TABLET | Freq: Three times a day (TID) | ORAL | 0 refills | Status: DC

## 2018-09-13 MED ORDER — HYDROCODONE-ACETAMINOPHEN 5-325 MG PO TABS: 1.0000 | ORAL_TABLET | Freq: Four times a day (QID) | ORAL | 0 refills | Status: DC | PRN

## 2018-09-13 MED ORDER — PREDNISONE 10 MG PO TABS: ORAL_TABLET | ORAL | 0 refills | Status: DC

## 2018-09-13 MED ORDER — HYDROCODONE-ACETAMINOPHEN 5-325 MG PO TABS
1.0000 | ORAL_TABLET | Freq: Once | ORAL | Status: AC
  Administered 2018-09-13: 1 via ORAL
  Filled 2018-09-13: qty 1

## 2018-09-13 MED ORDER — ORPHENADRINE CITRATE 30 MG/ML IJ SOLN
60.0000 mg | Freq: Two times a day (BID) | INTRAMUSCULAR | Status: DC
  Administered 2018-09-13: 60 mg via INTRAMUSCULAR
  Filled 2018-09-13: qty 2

## 2018-09-13 NOTE — ED Notes (Signed)
Pt to ed with c/o back pain since Thursday night.  Pt states he is unable to get out of bed, pain shoots down right side and into legs.

## 2018-09-13 NOTE — Discharge Instructions (Signed)
Follow-up with Dr. Martha Clan who is the orthopedist on call.  Call make an appointment and tell them that you were seen in the emergency department.  Begin taking medication only as directed.  Baclofen is 1 tablet 3 times a day as needed for muscle spasms, prednisone tapering dose for the next 6 days and Norco if needed for moderate to severe pain.  You may use ice or heat to your back as needed for discomfort.  Also a list of clinics listed on your discharge papers is available to you at a discounted rate so that you will have a primary care provider.

## 2018-09-13 NOTE — ED Triage Notes (Signed)
Here for right lower back pain that radiates down leg.  Started 2 days ago. Does not remember injuring self. Pain no where near CVA/kidney level.  Pain worst when laying down and better when sitting.  Increased pain with any movement.  Is on coumadin for DVT, denies hematuria. No fevers.

## 2018-09-13 NOTE — ED Provider Notes (Signed)
[facility] Emergency Department Provider Note  ____________________________________________   First MD Initiated Contact with Patient 09/13/18 1328     (approximate)  I have reviewed the triage vital signs and the nursing notes.   HISTORY  Chief Complaint Back Pain   HPI Matthew [name] is a 48 y.o. male presents to the ED with complaint of right-sided lower back pain that radiates down his right leg.  Patient has been taking Tylenol without any relief.  He is unable to take any anti-inflammatories as he currently is taking Coumadin due to a DVT.  Patient still continues to ambulate without any assistance but complains that with any movement his pain is increased.  Denies any saddle anesthesias or incontinence of bowel or bladder.  He states pain has been keeping him up at night as he cannot get comfortable in bed.  He rates his pain as an 8 out of 10.   Past Medical History:  Diagnosis Date  . Hx of blood clots   . Sleep apnea     There are no active problems to display for this patient.   History reviewed. No pertinent surgical history.  Prior to Admission medications   Medication Sig Start Date End Date Taking? Authorizing Provider  albuterol (ACCUNEB) 1.25 MG/3ML nebulizer solution Take 1 ampule by nebulization every 6 (six) hours as needed for wheezing.    [provider]  Baclofen 5 MG TABS Take 1 tablet by mouth 3 (three) times daily. 09/13/18   Tommi Rumps, PA-C  enoxaparin (LOVENOX) 30 MG/0.3ML injection Inject 1.4 mLs (140 mg total) into the skin 2 (two) times daily for 5 days. 08/28/18 09/02/18  Emily Filbert, MD  HYDROcodone-acetaminophen (NORCO/VICODIN) 5-325 MG tablet Take 1 tablet by mouth every 6 (six) hours as needed for moderate pain. 09/13/18   Tommi Rumps, PA-C  predniSONE (DELTASONE) 10 MG tablet Take 6 tablets  today, on day 2 take 5 tablets, day 3 take 4 tablets, day 4 take 3 tablets, day 5 take  2  tablets and 1 tablet the last day 09/13/18   Tommi Rumps, PA-C  warfarin (COUMADIN) 5 MG tablet Take 1 tablet (5 mg total) by mouth 2 (two) times daily. 08/21/18 09/20/18  Iloabachie, Chioma E, NP  warfarin (COUMADIN) 5 MG tablet Take 1 tablet (5 mg total) by mouth daily. 08/28/18 08/28/19  Emily Filbert, MD    Allergies Patient has no known allergies.  History reviewed. No pertinent family history.  Social History Social History   Tobacco Use  . Smoking status: Current Every Day Smoker    Packs/day: 0.50    Types: Cigarettes  . Smokeless tobacco: Never Used  Substance Use Topics  . Alcohol use: Yes    Comment: occassional  . Drug use: Never    Review of Systems Constitutional: No fever/chills Cardiovascular: Denies chest pain. Respiratory: Denies shortness of breath. Musculoskeletal: Positive right lower back pain.  Positive right leg radiculopathy. Skin: Negative for rash. Neurological: Negative for headaches, focal weakness or numbness. ____________________________________________   PHYSICAL EXAM:  VITAL SIGNS: ED Triage Vitals  Enc Vitals Group     BP 09/13/18 1233 101/68     Pulse Rate 09/13/18 1233 86     Resp 09/13/18 1233 20     Temp 09/13/18 1233 98.6 F (37 C)     Temp Source 09/13/18 1233 Oral     SpO2 09/13/18 1233 93 %     Weight --  Height --      Head Circumference --      Peak Flow --      Pain Score 09/13/18 1236 8     Pain Loc --      Pain Edu? --      Excl. in GC? --    Constitutional: Alert and oriented. Well appearing and in no acute distress.  Moderately obese. Eyes: Conjunctivae are normal.  Head: Atraumatic. Nose: No congestion/rhinnorhea. Neck: No stridor.   Cardiovascular: Normal rate, regular rhythm. Grossly normal heart sounds.  Good peripheral circulation. Respiratory: Normal respiratory effort.  No retractions. Lungs CTAB. Gastrointestinal: Soft and nontender. No distention. Musculoskeletal: Examination of the  back there is no gross deformity however there is moderate tenderness on palpation of the right SI joint area.  There is no point tenderness on palpation of the vertebral bodies lumbar spine.  Range of motion is restricted secondary to discomfort.  Straight leg raises are difficult secondary to patient's intolerance.  Good muscle strength bilaterally.  Patient was able to get from the wheelchair to stretcher with minimal assistance. Neurologic:  Normal speech and language. No gross focal neurologic deficits are appreciated.  Reflexes are 1+ bilaterally. Skin:  Skin is warm, dry and intact. No rash noted. Psychiatric: Mood and affect are normal. Speech and behavior are normal.  ____________________________________________   LABS (all labs ordered are listed, but only abnormal results are displayed)  Labs Reviewed - No data to display   PROCEDURES  Procedure(s) performed: None  Procedures  Critical Care performed: No  ____________________________________________   INITIAL IMPRESSION / ASSESSMENT AND PLAN / ED COURSE  As part of my medical decision making, I reviewed the following data within the electronic MEDICAL RECORD NUMBER Notes from prior ED visits and [place] Controlled Substance Database  Patient presents to the ED with complaint of right low back pain with right leg radiculopathy.  Patient denies any recent injury.  He was ambulatory to the stretcher for evaluation with minimal assistance.  Physical exam was unremarkable with the exception of moderate obesity.  Patient was unable to tolerate straight leg raises.  He was given Norco and Norflex 60 mg IM while in the ED.  Patient is to continue with baclofen 5 mg 1 3 times daily, Norco as needed for pain every 6 hours, and a tapering dose of prednisone.  Patient is to follow-up with his PCP or Dr. Martha Clan if any continued problems.  ____________________________________________   FINAL CLINICAL IMPRESSION(S) / ED DIAGNOSES  Final  diagnoses:  Acute right-sided low back pain with right-sided sciatica     ED Discharge Orders         Ordered    predniSONE (DELTASONE) 10 MG tablet     09/13/18 1555    HYDROcodone-acetaminophen (NORCO/VICODIN) 5-325 MG tablet  Every 6 hours PRN     09/13/18 1555    Baclofen 5 MG TABS  3 times daily     09/13/18 1555           Note:  This document was prepared using Dragon voice recognition software and may include unintentional dictation errors.    Tommi Rumps, PA-C 09/13/18 1624    Jeanmarie Plant, MD 09/14/18 1434

## 2018-09-16 ENCOUNTER — Other Ambulatory Visit: Payer: Self-pay

## 2018-09-16 DIAGNOSIS — I825Y2 Chronic embolism and thrombosis of unspecified deep veins of left proximal lower extremity: Secondary | ICD-10-CM

## 2018-09-16 LAB — POCT INR: INR: 1.4 — AB (ref 2.0–3.0)

## 2018-09-17 ENCOUNTER — Other Ambulatory Visit: Payer: Self-pay

## 2018-09-17 ENCOUNTER — Emergency Department
Admission: EM | Admit: 2018-09-17 | Discharge: 2018-09-17 | Disposition: A | Discharge status: Home / Self Care | Payer: Self-pay | Attending: Emergency Medicine | Admitting: Emergency Medicine

## 2018-09-17 ENCOUNTER — Emergency Department: Payer: Self-pay

## 2018-09-17 DIAGNOSIS — M25572 Pain in left ankle and joints of left foot: Secondary | ICD-10-CM | POA: Insufficient documentation

## 2018-09-17 DIAGNOSIS — Z7901 Long term (current) use of anticoagulants: Secondary | ICD-10-CM | POA: Insufficient documentation

## 2018-09-17 DIAGNOSIS — M25571 Pain in right ankle and joints of right foot: Secondary | ICD-10-CM | POA: Insufficient documentation

## 2018-09-17 DIAGNOSIS — Z79899 Other long term (current) drug therapy: Secondary | ICD-10-CM | POA: Insufficient documentation

## 2018-09-17 DIAGNOSIS — F17210 Nicotine dependence, cigarettes, uncomplicated: Secondary | ICD-10-CM | POA: Insufficient documentation

## 2018-09-17 MED ORDER — DEXAMETHASONE SODIUM PHOSPHATE 10 MG/ML IJ SOLN
10.0000 mg | Freq: Once | INTRAMUSCULAR | Status: AC
  Administered 2018-09-17: 10 mg via INTRAMUSCULAR
  Filled 2018-09-17: qty 1

## 2018-09-17 NOTE — ED Provider Notes (Signed)
[facility] Emergency Department Provider Note  ____________________________________________   First MD Initiated Contact with Patient 09/17/18 1149     (approximate)  I have reviewed the triage vital signs and the nursing notes.   HISTORY  Chief Complaint Foot Pain   HPI Matthew [name] is a 48 y.o. male presents to the ED with complaint of bilateral ankle pain.  Patient states that started yesterday while he was at work.  He denies any injury.  He has been taking Tylenol without any relief of his pain.  He denies any previous injury to his ankles or history of gout.  Patient states that the pain was worse while at work and he left.  Patient was also seen 4 days ago for low back pain in this ED. at that time he was prescribed prednisone, Norco and baclofen.  Currently rates his pain as an 8 out of 10.   Past Medical History:  Diagnosis Date  . Hx of blood clots   . Sleep apnea     There are no active problems to display for this patient.   History reviewed. No pertinent surgical history.  Prior to Admission medications   Medication Sig Start Date End Date Taking? Authorizing Provider  albuterol (ACCUNEB) 1.25 MG/3ML nebulizer solution Take 1 ampule by nebulization every 6 (six) hours as needed for wheezing.    [provider]  Baclofen 5 MG TABS Take 1 tablet by mouth 3 (three) times daily. 09/13/18   Tommi Rumps, PA-C  enoxaparin (LOVENOX) 30 MG/0.3ML injection Inject 1.4 mLs (140 mg total) into the skin 2 (two) times daily for 5 days. 08/28/18 09/02/18  Emily Filbert, MD  HYDROcodone-acetaminophen (NORCO/VICODIN) 5-325 MG tablet Take 1 tablet by mouth every 6 (six) hours as needed for moderate pain. 09/13/18   Tommi Rumps, PA-C  predniSONE (DELTASONE) 10 MG tablet Take 6 tablets  today, on day 2 take 5 tablets, day 3 take 4 tablets, day 4 take 3 tablets, day 5 take  2 tablets and 1 tablet the last day 09/13/18   Tommi Rumps, PA-C  warfarin (COUMADIN) 5 MG tablet Take 1 tablet (5 mg total) by mouth 2 (two) times daily. 08/21/18 09/20/18  Iloabachie, Chioma E, NP  warfarin (COUMADIN) 5 MG tablet Take 1 tablet (5 mg total) by mouth daily. 08/28/18 08/28/19  Emily Filbert, MD    Allergies Patient has no known allergies.  No family history on file.  Social History Social History   Tobacco Use  . Smoking status: Current Every Day Smoker    Packs/day: 0.50    Types: Cigarettes  . Smokeless tobacco: Never Used  Substance Use Topics  . Alcohol use: Yes    Comment: occassional  . Drug use: Never    Review of Systems Constitutional: No fever/chills Cardiovascular: Denies chest pain. Respiratory: Denies shortness of breath. Gastrointestinal: No abdominal pain.   Musculoskeletal: Bilateral ankle pain. Skin: Negative for rash. Neurological: Negative for headaches, focal weakness or numbness.  ____________________________________________   PHYSICAL EXAM:  VITAL SIGNS: ED Triage Vitals [09/17/18 1136]  Enc Vitals Group     BP 106/71     Pulse Rate 77     Resp 18     Temp 98.6 F (37 C)     Temp src      SpO2 99 %     Weight (!) 309 lb (140.2 kg)     Height 5\' 7"  (1.702 m)  Head Circumference      Peak Flow      Pain Score 8     Pain Loc      Pain Edu?      Excl. in GC?    Constitutional: Alert and oriented. Well appearing and in no acute distress.  Patient was lying on his right side asleep when entering the room. Eyes: Conjunctivae are normal.  Head: Atraumatic. Neck: No stridor.   Cardiovascular: Normal rate, regular rhythm. Grossly normal heart sounds.  Good peripheral circulation. Respiratory: Normal respiratory effort.  No retractions. Lungs CTAB. Musculoskeletal: On exam bilateral ankles do not show any evidence of acute injury.  The lateral malleolus right ankle is moderately tender to palpation.  Right ankle more tender than the left.  There is soft tissue edema  noted to both ankles that is nonpitting.  Patient is able to flex and extend bilateral feet without difficulty.  No ecchymosis or abrasions were seen.  No erythema or warmth is noted. Neurologic:  Normal speech and language. No gross focal neurologic deficits are appreciated.  Skin:  Skin is warm, dry and intact. No rash noted.  No ecchymosis, abrasions or erythema present. Psychiatric: Mood and affect are normal. Speech and behavior are normal.  ____________________________________________   LABS (all labs ordered are listed, but only abnormal results are displayed)  Labs Reviewed - No data to display  RADIOLOGY  Official radiology report(s): Dg Ankle Complete Right  Result Date: 09/17/2018 CLINICAL DATA:  Acute right ankle pain without known injury. EXAM: RIGHT ANKLE - COMPLETE 3+ VIEW COMPARISON:  None. FINDINGS: There is no evidence of fracture, dislocation, or joint effusion. There is no evidence of arthropathy or other focal bone abnormality. Soft tissues are unremarkable. IMPRESSION: Negative. Electronically Signed   By: Lupita RaiderJames  Green Jr, M.D.   On: 09/17/2018 13:33    ____________________________________________   PROCEDURES  Procedure(s) performed: None  Procedures  Critical Care performed: No  ____________________________________________   INITIAL IMPRESSION / ASSESSMENT AND PLAN / ED COURSE  As part of my medical decision making, I reviewed the following data within the electronic MEDICAL RECORD NUMBER Notes from prior ED visits and [place] Controlled Substance Database  48 year old obese male presents to the ED with complaint of bilateral ankle pain.  There was no history of injury and patient states that the pain was bad enough that he had to leave work to be seen.  Patient was also present in the ED 4 days ago complaining of low back pain.  When we discussed x-ray findings of his right ankle it was discovered that patient never obtained his prescriptions from being seen  prior.  He states that medication management was closed when he was seen and he did not obtain the prednisone, hydrocodone or baclofen.  Today he states that he cannot go there because "I cannot walk".  Patient was given Decadron 10 mg IM while in the ED to give him more time to be able to obtain his medication from medication management.  [facility] or given the option to follow-up with Dr. Martha ClanKrasinski.  ____________________________________________   FINAL CLINICAL IMPRESSION(S) / ED DIAGNOSES  Final diagnoses:  Acute bilateral ankle pain     ED Discharge Orders    None       Note:  This document was prepared using Dragon voice recognition software and may include unintentional dictation errors.    Tommi RumpsSummers, [place] Kosch L, PA-C 09/17/18 1631    Sharman CheekStafford, Phillip,  MD 09/18/18 1457

## 2018-09-17 NOTE — ED Triage Notes (Addendum)
Pt comes via POV with c/o bilateral foot pain. Pt states it started yesterday morning. Pt denies any recent fall or injury  Pt states 8/10 throbbing pain. Pt denies any recent long distance trips. Pt states pain in ankles too.

## 2018-09-24 ENCOUNTER — Ambulatory Visit: Payer: Self-pay | Admitting: Gerontology

## 2018-10-02 ENCOUNTER — Ambulatory Visit: Payer: Self-pay | Admitting: Gerontology

## 2018-10-11 ENCOUNTER — Emergency Department
Admission: EM | Admit: 2018-10-11 | Discharge: 2018-10-11 | Disposition: A | Discharge status: Home / Self Care | Payer: Self-pay | Attending: Emergency Medicine | Admitting: Emergency Medicine

## 2018-10-11 ENCOUNTER — Other Ambulatory Visit: Payer: Self-pay

## 2018-10-11 ENCOUNTER — Encounter: Payer: Self-pay | Admitting: Emergency Medicine

## 2018-10-11 DIAGNOSIS — Z79899 Other long term (current) drug therapy: Secondary | ICD-10-CM | POA: Insufficient documentation

## 2018-10-11 DIAGNOSIS — Z7901 Long term (current) use of anticoagulants: Secondary | ICD-10-CM | POA: Insufficient documentation

## 2018-10-11 DIAGNOSIS — J101 Influenza due to other identified influenza virus with other respiratory manifestations: Secondary | ICD-10-CM | POA: Insufficient documentation

## 2018-10-11 DIAGNOSIS — F17210 Nicotine dependence, cigarettes, uncomplicated: Secondary | ICD-10-CM | POA: Insufficient documentation

## 2018-10-11 LAB — INFLUENZA PANEL BY PCR (TYPE A & B)
INFLBPCR: POSITIVE — AB
Influenza A By PCR: NEGATIVE

## 2018-10-11 NOTE — ED Notes (Signed)
Topaz pad not working. Pt understood discharge instructions.

## 2018-10-11 NOTE — ED Triage Notes (Addendum)
Flu symptoms since weds with fever and weakness and now has diarrhea

## 2018-10-11 NOTE — Discharge Instructions (Signed)
Take Tylenol or ibuprofen for body aches, headache, or fever.  Follow-up with the primary care provider of your choice for symptoms that are not improving over the next few days.  Return to the emergency department for symptoms change or worsen if you are unable to schedule an appointment.

## 2018-10-11 NOTE — ED Provider Notes (Signed)
[facility] Emergency Department Provider Note  ____________________________________________  Time seen: Approximately 6:55 PM  I have reviewed the triage vital signs and the nursing notes.   HISTORY  Chief Complaint Influenza   HPI Matthew [name] is a 48 y.o. male who presents to the emergency department for treatment and evaluation of intermittent fever and chills with diarrhea that has been present for the past 3 days.  No relief with over-the-counter TheraFlu.    Past Medical History:  Diagnosis Date  . Hx of blood clots   . Sleep apnea     There are no active problems to display for this patient.   History reviewed. No pertinent surgical history.  Prior to Admission medications   Medication Sig Start Date End Date Taking? Authorizing Provider  albuterol (ACCUNEB) 1.25 MG/3ML nebulizer solution Take 1 ampule by nebulization every 6 (six) hours as needed for wheezing.    [provider]  Baclofen 5 MG TABS Take 1 tablet by mouth 3 (three) times daily. 09/13/18   Tommi RumpsSummers, Rhonda L, PA-C  enoxaparin (LOVENOX) 30 MG/0.3ML injection Inject 1.4 mLs (140 mg total) into the skin 2 (two) times daily for 5 days. 08/28/18 09/02/18  Emily FilbertWilliams, Jonathan E, MD  HYDROcodone-acetaminophen (NORCO/VICODIN) 5-325 MG tablet Take 1 tablet by mouth every 6 (six) hours as needed for moderate pain. 09/13/18   Tommi RumpsSummers, Rhonda L, PA-C  predniSONE (DELTASONE) 10 MG tablet Take 6 tablets  today, on day 2 take 5 tablets, day 3 take 4 tablets, day 4 take 3 tablets, day 5 take  2 tablets and 1 tablet the last day 09/13/18   Tommi RumpsSummers, Rhonda L, PA-C  warfarin (COUMADIN) 5 MG tablet Take 1 tablet (5 mg total) by mouth 2 (two) times daily. 08/21/18 09/20/18  Iloabachie, Chioma E, NP  warfarin (COUMADIN) 5 MG tablet Take 1 tablet (5 mg total) by mouth daily. 08/28/18 08/28/19  Emily FilbertWilliams, Jonathan E, MD    Allergies Patient has no known allergies.  History reviewed. No pertinent  family history.  Social History Social History   Tobacco Use  . Smoking status: Current Every Day Smoker    Packs/day: 0.50    Types: Cigarettes  . Smokeless tobacco: Never Used  Substance Use Topics  . Alcohol use: Yes    Comment: occassional  . Drug use: Never    Review of Systems Constitutional: Positive for fever/chills.  Decreased appetite. ENT: Negative for sore throat. Cardiovascular: Denies chest pain. Respiratory: No shortness of breath.  Negative for cough.  Negative for wheezing.  Gastrointestinal: No nausea, no vomiting.  Positive for diarrhea.  Musculoskeletal: Positive for body aches Skin: Negative for rash. Neurological: Negative for headaches ____________________________________________   PHYSICAL EXAM:  VITAL SIGNS: ED Triage Vitals  Enc Vitals Group     BP 10/11/18 1630 113/76     Pulse Rate 10/11/18 1630 (!) 52     Resp 10/11/18 1630 20     Temp 10/11/18 1630 99.8 F (37.7 C)     Temp Source 10/11/18 1630 Oral     SpO2 10/11/18 1630 95 %     Weight 10/11/18 1631 (!) 308 lb 10.3 oz (140 kg)     Height 10/11/18 1631 5\' 7"  (1.702 m)     Head Circumference --      Peak Flow --      Pain Score 10/11/18 1631 0     Pain Loc --      Pain Edu? --      Excl.  in GC? --     Constitutional: Alert and oriented.  Acutely ill appearing and in no acute distress. Eyes: Conjunctivae are normal. Ears: Bilateral tympanic membranes are normal Nose: No sinus congestion noted; no rhinnorhea. Mouth/Throat: Mucous membranes are moist.  Oropharynx clear. Tonsils flat. Uvula midline. Neck: No stridor.  Lymphatic: No cervical lymphadenopathy. Cardiovascular: Normal rate, regular rhythm. Good peripheral circulation. Respiratory: Respirations are even and unlabored.  No retractions.  Breath sounds clear to auscultation. Gastrointestinal: Soft and nontender.  Musculoskeletal: FROM x 4 extremities.  Neurologic:  Normal speech and language. Skin:  Skin is warm, dry and  intact. No rash noted. Psychiatric: Mood and affect are normal. Speech and behavior are normal.  ____________________________________________   LABS (all labs ordered are listed, but only abnormal results are displayed)  Labs Reviewed  INFLUENZA PANEL BY PCR (TYPE A & B) - Abnormal; Notable for the following components:      Result Value   Influenza B By PCR POSITIVE (*)    All other components within normal limits   ____________________________________________  EKG  Not indicated ____________________________________________  RADIOLOGY  Not indicated ____________________________________________   PROCEDURES  Procedure(s) performed: None  Critical Care performed: No ____________________________________________   INITIAL IMPRESSION / ASSESSMENT AND PLAN / ED COURSE  48 y.o. male presenting to the emergency department for vague symptoms that were not clearly influenza.  Influenza testing was completed and he does have  influenza B.  Symptomatic treatment was advised and he will follow-up with his primary care provider for symptoms that are not improving over the next few days.   Medications - No data to display  ED Discharge Orders    None       Pertinent labs & imaging results that were available during my care of the patient were reviewed by me and considered in my medical decision making (see chart for details).    If controlled substance prescribed during this visit, 12 month history viewed on the [facility] prior to issuing an initial prescription for Schedule II or III opiod. ____________________________________________   FINAL CLINICAL IMPRESSION(S) / ED DIAGNOSES  Final diagnoses:  Influenza B    Note:  This document was prepared using Dragon voice recognition software and may include unintentional dictation errors.     Chinita Pester, FNP 10/12/18 0019    Sharman Cheek, MD 10/17/18 [phone]

## 2018-10-16 ENCOUNTER — Other Ambulatory Visit: Payer: Self-pay

## 2018-10-16 ENCOUNTER — Ambulatory Visit: Payer: Self-pay | Admitting: Gerontology

## 2018-10-16 ENCOUNTER — Encounter: Payer: Self-pay | Admitting: Gerontology

## 2018-10-16 VITALS — BP 113/79 | HR 79 | Ht 67.0 in | Wt 291.1 lb

## 2018-10-16 DIAGNOSIS — J101 Influenza due to other identified influenza virus with other respiratory manifestations: Secondary | ICD-10-CM

## 2018-10-16 DIAGNOSIS — R059 Cough, unspecified: Secondary | ICD-10-CM

## 2018-10-16 DIAGNOSIS — R05 Cough: Secondary | ICD-10-CM

## 2018-10-16 DIAGNOSIS — I825Y2 Chronic embolism and thrombosis of unspecified deep veins of left proximal lower extremity: Secondary | ICD-10-CM

## 2018-10-16 MED ORDER — BENZONATATE 100 MG PO CAPS: 100.0000 mg | ORAL_CAPSULE | Freq: Two times a day (BID) | ORAL | 0 refills | Status: DC

## 2018-10-16 MED ORDER — AZITHROMYCIN 250 MG PO TABS: ORAL_TABLET | ORAL | 0 refills | Status: DC

## 2018-10-16 MED ORDER — PREDNISONE 5 MG PO TABS: 40.0000 mg | ORAL_TABLET | Freq: Every day | ORAL | 0 refills | Status: DC

## 2018-10-16 MED ORDER — WARFARIN SODIUM 5 MG PO TABS: 5.0000 mg | ORAL_TABLET | Freq: Two times a day (BID) | ORAL | 0 refills | Status: DC

## 2018-10-16 NOTE — Progress Notes (Signed)
Acute Office Visit  Subjective:    Patient ID: Matthew [name], male    DOB: [birth year], 48 y.o.   MRN: [id number]  Chief Complaint  Patient presents with  . Follow-up    tested positive 10/11/2018 for influenza B    HPI Patient is in today for follow up after ED visit on 10/11/18, and was positive for Influenza B. He reports that he continues to take otc thera flu. He states that he has being having productive cough for 1 week and produces yellowish green phelgm. He denies fever, chills, shortness of breath, wheezing. He reports using nebulizer treatment at home. He denies chest pain, palpitation. He denies generalized fatigue, aches and pain and states that he has 50 % improvement since visiting the ED. He states that he continues to smoke 6 cigars daily and denies the desire to quit.  He reports that he needs his gall bladder taking out and was diagnosed with gall stones in 2018 when in [place] by Dr Lottie Mussel. He denies abdominal pain and continues to have constant lose stool 5 minutes after eating. Otherwise he denies further complaint. Will request medical records from Dr Hervey Ard in [place].  He has a history of blood clot and takes 5 mg coumadin bid, he has cancelled many of his follow up appointments for PT/INR check and follow up. Today during office visit, he was angry that blood draw was requested for lab and he left against medical advise prior to the end of his visit, stating that his wife needs to go to work.  Past Medical History:  Diagnosis Date  . Hx of blood clots   . Sleep apnea     No past surgical history on file.  No family history on file.  Social History   Socioeconomic History  . Marital status: Married    Spouse name: Not on file  . Number of children: Not on file  . Years of education: Not on file  . Highest education level: Not on file  Occupational History  . Not on file  Social Needs  . Financial resource strain: Not on file  . Food  insecurity:    Worry: Not on file    Inability: Not on file  . Transportation needs:    Medical: Not on file    Non-medical: Not on file  Tobacco Use  . Smoking status: Current Every Day Smoker    Packs/day: 0.50    Types: Cigarettes  . Smokeless tobacco: Never Used  Substance and Sexual Activity  . Alcohol use: Yes    Comment: occassional  . Drug use: Never  . Sexual activity: Yes  Lifestyle  . Physical activity:    Days per week: Not on file    Minutes per session: Not on file  . Stress: Not on file  Relationships  . Social connections:    Talks on phone: Not on file    Gets together: Not on file    Attends religious service: Not on file    Active member of club or organization: Not on file    Attends meetings of clubs or organizations: Not on file    Relationship status: Not on file  . Intimate partner violence:    Fear of current or ex partner: Not on file    Emotionally abused: Not on file    Physically abused: Not on file    Forced sexual activity: Not on file  Other Topics Concern  . Not on file  Social History Narrative  . Not on file    Outpatient Medications Prior to Visit  Medication Sig Dispense Refill  . warfarin (COUMADIN) 5 MG tablet Take 1 tablet (5 mg total) by mouth daily. 30 tablet 11  . albuterol (ACCUNEB) 1.25 MG/3ML nebulizer solution Take 1 ampule by nebulization every 6 (six) hours as needed for wheezing.    . Baclofen 5 MG TABS Take 1 tablet by mouth 3 (three) times daily. (Patient not taking: Reported on 10/16/2018) 21 tablet 0  . enoxaparin (LOVENOX) 30 MG/0.3ML injection Inject 1.4 mLs (140 mg total) into the skin 2 (two) times daily for 5 days. 14 mL 0  . HYDROcodone-acetaminophen (NORCO/VICODIN) 5-325 MG tablet Take 1 tablet by mouth every 6 (six) hours as needed for moderate pain. (Patient not taking: Reported on 10/16/2018) 12 tablet 0  . predniSONE (DELTASONE) 10 MG tablet Take 6 tablets  today, on day 2 take 5 tablets, day 3 take 4  tablets, day 4 take 3 tablets, day 5 take  2 tablets and 1 tablet the last day (Patient not taking: Reported on 10/16/2018) 21 tablet 0  . warfarin (COUMADIN) 5 MG tablet Take 1 tablet (5 mg total) by mouth 2 (two) times daily. 60 tablet 0   No facility-administered medications prior to visit.     No Known Allergies  Review of Systems  Constitutional: Negative for chills, fever and malaise/fatigue.  HENT: Negative.   Respiratory: Positive for cough, sputum production (yellowish green) and wheezing.   Cardiovascular: Negative.   Gastrointestinal: Positive for diarrhea (5 minutes after eating). Negative for abdominal pain.  Genitourinary: Negative.   Musculoskeletal: Negative.   Neurological: Negative.   Psychiatric/Behavioral: Negative.        Objective:    Physical Exam  Constitutional: He is oriented to person, place, and time. He appears well-developed.  Morbid obesity  HENT:  Head: Normocephalic and atraumatic.  Eyes: Pupils are equal, round, and reactive to light. EOM are normal.  Cardiovascular: Normal rate and regular rhythm.  Pulmonary/Chest: He has wheezes in the right upper field, the right lower field and the left lower field.  Abdominal: Soft. Bowel sounds are normal.  Neurological: He is alert and oriented to person, place, and time.  Skin: Skin is warm.  Psychiatric: He has a normal mood and affect. His behavior is normal. Judgment and thought content normal.    BP 113/79 (BP Location: Left Arm, Patient Position: Sitting)   Pulse 79   Ht _0  (1.702 m)   Wt 291 lb 1.6 oz (132 kg)   SpO2 92%   BMI 45.59 kg/m  Wt Readings from Last 3 Encounters:  10/16/18 291 lb 1.6 oz (132 kg)  10/11/18 (!) 308 lb 10.3 oz (140 kg)  09/17/18 (!) 309 lb (140.2 kg)   He lost 17 pounds in 7 days. Health Maintenance Due  Topic Date Due  . HIV Screening  07/09/1985  . TETANUS/TDAP  07/09/1989  . INFLUENZA VACCINE  04/03/2018   He states that he doesn't get influenza  vaccine. There are no preventive care reminders to display for this patient.   Lab Results  Component Value Date   TSH 3.650 09/10/2018   Lab Results  Component Value Date   WBC 11.5 (H) 09/10/2018   HGB 17.8 (H) 09/10/2018   HCT 51.3 (H) 09/10/2018   MCV 94 09/10/2018   PLT 233 09/10/2018   Lab Results  Component Value Date   NA 143 09/10/2018   K 4.5  09/10/2018   CO2 32 (H) 09/10/2018   GLUCOSE 76 09/10/2018   BUN 8 09/10/2018   CREATININE 0.74 (L) 09/10/2018   BILITOT 0.2 09/10/2018   ALKPHOS 160 (H) 09/10/2018   AST 17 09/10/2018   ALT 13 09/10/2018   PROT 7.1 09/10/2018   ALBUMIN 3.6 09/10/2018   CALCIUM 9.8 09/10/2018   ANIONGAP 7 08/28/2018   Lab Results  Component Value Date   CHOL 155 09/10/2018   Lab Results  Component Value Date   HDL 38 (L) 09/10/2018   Lab Results  Component Value Date   LDLCALC 84 09/10/2018   Lab Results  Component Value Date   TRIG 163 (H) 09/10/2018   Lab Results  Component Value Date   CHOLHDL 4.1 09/10/2018   Lab Results  Component Value Date   HGBA1C 5.9 (H) 09/10/2018       Assessment & Plan:   Problem List Items Addressed This Visit    None     1. Influenza due to influenza virus, type B He declined blood draw, continue to symptom management. - CBC w/Diff; Future - Comp Met (CMET); Future  2. 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 - Protime-INR ( [facility] ONLY); Future  3. Cough in adult and DDX CAP - Due to his co morbidities, he will start Z- Pack and Prednisone taper for possible Community acquired Pneumonia. Chest X ray was ordered. - azithromycin (ZITHROMAX) 250 MG tablet; Take 500 mg first day and 250 mg daily x 4 days  Dispense: 6 each; Refill: 0 - predniSONE (DELTASONE) 5 MG tablet; Take 8 tablets (40 mg total) by mouth daily with breakfast. 40 mg day 1, 30 mg day 2, 20 mg day 3, 10 mg day 1  Dispense: 20 tablet;  Refill: 0 - benzonatate (TESSALON PERLES) 100 MG capsule; Take 1 capsule (100 mg total) by mouth 2 (two) times daily.  Dispense: 20 capsule; Refill: 0  4: History of Gallstones per patient.- - Will request for medical records from Dr Lottie Mussel in [facility], and evaluate history of Gallstones at 1 week follow up visit.   No orders of the defined types were placed in this encounter.  Follow up in 1 week  Lener Ventresca E Hattie Perch, NP

## 2018-10-17 ENCOUNTER — Other Ambulatory Visit: Payer: Self-pay

## 2018-10-17 DIAGNOSIS — J101 Influenza due to other identified influenza virus with other respiratory manifestations: Secondary | ICD-10-CM

## 2018-10-17 DIAGNOSIS — R059 Cough, unspecified: Secondary | ICD-10-CM

## 2018-10-17 DIAGNOSIS — R05 Cough: Secondary | ICD-10-CM

## 2018-10-20 ENCOUNTER — Other Ambulatory Visit: Payer: Self-pay

## 2018-10-20 ENCOUNTER — Emergency Department
Admission: EM | Admit: 2018-10-20 | Discharge: 2018-10-20 | Disposition: A | Discharge status: Home / Self Care | Payer: Self-pay | Attending: Emergency Medicine | Admitting: Emergency Medicine

## 2018-10-20 ENCOUNTER — Encounter: Payer: Self-pay | Admitting: Emergency Medicine

## 2018-10-20 ENCOUNTER — Emergency Department: Payer: Self-pay

## 2018-10-20 DIAGNOSIS — F17210 Nicotine dependence, cigarettes, uncomplicated: Secondary | ICD-10-CM | POA: Insufficient documentation

## 2018-10-20 DIAGNOSIS — Z7901 Long term (current) use of anticoagulants: Secondary | ICD-10-CM | POA: Insufficient documentation

## 2018-10-20 DIAGNOSIS — R319 Hematuria, unspecified: Secondary | ICD-10-CM | POA: Insufficient documentation

## 2018-10-20 LAB — BASIC METABOLIC PANEL
Anion gap: 6 (ref 5–15)
BUN: 10 mg/dL (ref 6–20)
CO2: 33 mmol/L — ABNORMAL HIGH (ref 22–32)
Calcium: 8.5 mg/dL — ABNORMAL LOW (ref 8.9–10.3)
Chloride: 103 mmol/L (ref 98–111)
Creatinine, Ser: 0.83 mg/dL (ref 0.61–1.24)
GFR calc non Af Amer: >60 mL/min (ref >60)
Glucose, Bld: 105 mg/dL — ABNORMAL HIGH (ref 70–99)
Potassium: 4 mmol/L (ref 3.5–5.1)
Sodium: 142 mmol/L (ref 135–145)

## 2018-10-20 LAB — URINALYSIS, COMPLETE (UACMP) WITH MICROSCOPIC
Bacteria, UA: NONE SEEN
Bilirubin Urine: NEGATIVE
Glucose, UA: NEGATIVE mg/dL
Ketones, ur: NEGATIVE mg/dL
Nitrite: NEGATIVE
Protein, ur: NEGATIVE mg/dL
RBC / HPF: >50 RBC/hpf — ABNORMAL HIGH (ref 0–5)
Specific Gravity, Urine: 1.018 (ref 1.005–1.030)
pH: 5 (ref 5.0–8.0)

## 2018-10-20 LAB — CBC
HCT: 54 % — ABNORMAL HIGH (ref 39.0–52.0)
Hemoglobin: 17.6 g/dL — ABNORMAL HIGH (ref 13.0–17.0)
MCH: 31.5 pg (ref 26.0–34.0)
MCHC: 32.6 g/dL (ref 30.0–36.0)
MCV: 96.8 fL (ref 80.0–100.0)
Platelets: 212 10*3/uL (ref 150–400)
RBC: 5.58 MIL/uL (ref 4.22–5.81)
RDW: 14 % (ref 11.5–15.5)
WBC: 10.7 10*3/uL — AB (ref 4.0–10.5)
nRBC: 0 % (ref 0.0–0.2)

## 2018-10-20 LAB — PROTIME-INR
INR: 1.43
Prothrombin Time: 17.3 seconds — ABNORMAL HIGH (ref 11.4–15.2)

## 2018-10-20 MED ORDER — OXYCODONE-ACETAMINOPHEN 5-325 MG PO TABS
2.0000 | ORAL_TABLET | Freq: Once | ORAL | Status: AC
  Administered 2018-10-20: 2 via ORAL
  Filled 2018-10-20: qty 2

## 2018-10-20 MED ORDER — TRAMADOL HCL 50 MG PO TABS: 50.0000 mg | ORAL_TABLET | Freq: Four times a day (QID) | ORAL | 0 refills | Status: DC | PRN

## 2018-10-20 MED ORDER — SODIUM CHLORIDE 0.9 % IV SOLN
Freq: Once | INTRAVENOUS | Status: AC
  Administered 2018-10-20: 12:00:00 via INTRAVENOUS

## 2018-10-20 MED ORDER — CEPHALEXIN 250 MG PO CAPS: 250.0000 mg | ORAL_CAPSULE | Freq: Four times a day (QID) | ORAL | 0 refills | Status: AC

## 2018-10-20 MED ORDER — PHENAZOPYRIDINE HCL 200 MG PO TABS: 200.0000 mg | ORAL_TABLET | Freq: Three times a day (TID) | ORAL | 0 refills | Status: DC | PRN

## 2018-10-20 NOTE — ED Triage Notes (Signed)
Hematuria, today , dysuria yesterday.

## 2018-10-20 NOTE — ED Provider Notes (Signed)
[facility] Emergency Department Provider Note       Time seen: ----------------------------------------- 12:08 PM on 10/20/2018 -----------------------------------------   I have reviewed the triage vital signs and the nursing notes.  HISTORY   Chief Complaint Hematuria    HPI Matthew [name] is a 48 y.o. male with a history of DVT, sleep apnea who presents to the ED for gross hematuria.  States he has never had this happen before, dysuria and hematuria started yesterday.  10 days ago he was seen for flulike symptoms and notes that this resolved.  He does complain of some pelvic pain with gross hematuria.  Recently started back on his Coumadin but does not think his Coumadin levels could be elevated.  Past Medical History:  Diagnosis Date  . Hx of blood clots   . Sleep apnea     There are no active problems to display for this patient.   History reviewed. No pertinent surgical history.  Allergies Patient has no known allergies.  Social History Social History   Tobacco Use  . Smoking status: Current Every Day Smoker    Packs/day: 0.50    Types: Cigarettes  . Smokeless tobacco: Never Used  Substance Use Topics  . Alcohol use: Yes    Comment: occassional  . Drug use: Never   Review of Systems Constitutional: Negative for fever. Cardiovascular: Negative for chest pain. Respiratory: Negative for shortness of breath. Gastrointestinal: Negative for abdominal pain, vomiting and diarrhea. Genitourinary: Positive for dysuria, hematuria Musculoskeletal: Negative for back pain. Skin: Negative for rash. Neurological: Negative for headaches, focal weakness or numbness.  All systems negative/normal/unremarkable except as stated in the HPI  ____________________________________________   PHYSICAL EXAM:  VITAL SIGNS: ED Triage Vitals  Enc Vitals Group     BP 10/20/18 1016 116/80     Pulse Rate 10/20/18 1016 66     Resp 10/20/18 1016 18   Temp 10/20/18 1016 98.5 F (36.9 C)     Temp Source 10/20/18 1016 Oral     SpO2 10/20/18 1016 97 %     Weight 10/20/18 1017 291 lb (132 kg)     Height 10/20/18 1017 5\' 7"  (1.702 m)     Head Circumference --      Peak Flow --      Pain Score 10/20/18 1017 8     Pain Loc --      Pain Edu? --      Excl. in GC? --    Constitutional: Alert and oriented. Well appearing and in no distress. ENT      Head: Normocephalic and atraumatic.      Nose: No congestion/rhinnorhea.      Mouth/Throat: Mucous membranes are moist.      Neck: No stridor. Cardiovascular: Normal rate, regular rhythm. No murmurs, rubs, or gallops. Respiratory: Normal respiratory effort without tachypnea nor retractions.  Scattered rhonchi are noted Gastrointestinal: Suprapubic tenderness, no rebound or guarding.  Normal bowel sounds. Musculoskeletal: Nontender with normal range of motion in extremities. No lower extremity tenderness nor edema. Neurologic:  Normal speech and language. No gross focal neurologic deficits are appreciated.  Skin:  Skin is warm, dry and intact. No rash noted. Psychiatric: Mood and affect are normal. Speech and behavior are normal.  ____________________________________________  ED COURSE:  As part of my medical decision making, I reviewed the following data within the electronic MEDICAL RECORD NUMBER History obtained from family if available, nursing notes, old chart and ekg, as well as notes from prior ED  visits. Patient presented for gross hematuria, we will assess with labs and imaging as indicated at this time.   Procedures ____________________________________________   LABS (pertinent positives/negatives)  Labs Reviewed  URINALYSIS, COMPLETE (UACMP) WITH MICROSCOPIC - Abnormal; Notable for the following components:      Result Value   Color, Urine YELLOW (*)    APPearance CLEAR (*)    Hgb urine dipstick LARGE (*)    Leukocytes,Ua TRACE (*)    RBC / HPF >50 (*)    All other components  within normal limits  BASIC METABOLIC PANEL - Abnormal; Notable for the following components:   CO2 33 (*)    Glucose, Bld 105 (*)    Calcium 8.5 (*)    All other components within normal limits  CBC - Abnormal; Notable for the following components:   WBC 10.7 (*)    Hemoglobin 17.6 (*)    HCT 54.0 (*)    All other components within normal limits  PROTIME-INR - Abnormal; Notable for the following components:   Prothrombin Time 17.3 (*)    All other components within normal limits    RADIOLOGY Images were viewed by me  CT renal protocol IMPRESSION: No acute urinary tract pathology is identified. There is a 2 mm nonobstructing stone in the right kidney.  13 mm gallstone without CT evidence of cholecystitis or obstruction.  Cluster of high-density foci just posterior to the gastroesophageal junction. See above discussion. 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 The appearance is unchanged since the CT of December. Consider endoscopic evaluation. This would be unrelated to the acute presentation. ____________________________________________   DIFFERENTIAL DIAGNOSIS   Renal colic, UTI, pyelonephritis   FINAL ASSESSMENT AND PLAN  Hematuria   Plan: The patient had presented for hematuria. Patient's labs were overall reassuring.  Coumadin level was reasonable and subtherapeutic.  Patient's imaging did not reveal a cause for his hematuria.  I will place him on antibiotics and send a urine culture.  Overall have advised he must stop the Coumadin until his hematuria resolves.  He will be referred to urology for outpatient follow-up.   Ulice Dash, MD    Note: This note was generated in part or whole with voice recognition software. Voice recognition is usually quite accurate  but there are transcription errors that can and very often do occur. I apologize for any typographical errors that were not detected and corrected.     Emily Filbert, MD 10/20/18 1322

## 2018-10-20 NOTE — ED Notes (Signed)
First Nurse Note: Patient to ED via EMS complaining of urinary retention and hematuria. 8/10 pain.  On Coumadin.  Hx of recent back pain "that felt like it was moving" per EMS.  BP 126/90, HR 62, 94% on room air.

## 2018-10-20 NOTE — ED Notes (Signed)
Pt c/o painful/burning with urination for the past week, noticed blood in urine yesterday. Pt is on coumadin. Denies N/V/d.

## 2018-10-21 LAB — URINE CULTURE: Special Requests: NORMAL

## 2018-10-24 ENCOUNTER — Encounter: Payer: Self-pay | Admitting: Emergency Medicine

## 2018-10-24 ENCOUNTER — Emergency Department: Payer: Self-pay

## 2018-10-24 ENCOUNTER — Emergency Department
Admission: EM | Admit: 2018-10-24 | Discharge: 2018-10-24 | Disposition: A | Discharge status: Home / Self Care | Payer: Self-pay | Attending: Emergency Medicine | Admitting: Emergency Medicine

## 2018-10-24 ENCOUNTER — Other Ambulatory Visit: Payer: Self-pay

## 2018-10-24 DIAGNOSIS — I82402 Acute embolism and thrombosis of unspecified deep veins of left lower extremity: Secondary | ICD-10-CM | POA: Insufficient documentation

## 2018-10-24 DIAGNOSIS — F17210 Nicotine dependence, cigarettes, uncomplicated: Secondary | ICD-10-CM | POA: Insufficient documentation

## 2018-10-24 DIAGNOSIS — Z7901 Long term (current) use of anticoagulants: Secondary | ICD-10-CM | POA: Insufficient documentation

## 2018-10-24 LAB — CBC WITH DIFFERENTIAL/PLATELET
Abs Immature Granulocytes: 0.04 10*3/uL (ref 0.00–0.07)
Basophils Absolute: 0.1 10*3/uL (ref 0.0–0.1)
Basophils Relative: 1 %
Eosinophils Absolute: 0.2 10*3/uL (ref 0.0–0.5)
Eosinophils Relative: 2 %
HCT: 56.2 % — ABNORMAL HIGH (ref 39.0–52.0)
Hemoglobin: 18.2 g/dL — ABNORMAL HIGH (ref 13.0–17.0)
Immature Granulocytes: 0 %
LYMPHS ABS: 3.2 10*3/uL (ref 0.7–4.0)
Lymphocytes Relative: 28 %
MCH: 31.3 pg (ref 26.0–34.0)
MCHC: 32.4 g/dL (ref 30.0–36.0)
MCV: 96.6 fL (ref 80.0–100.0)
MONO ABS: 1 10*3/uL (ref 0.1–1.0)
MONOS PCT: 9 %
Neutro Abs: 7 10*3/uL (ref 1.7–7.7)
Neutrophils Relative %: 60 %
Platelets: 193 10*3/uL (ref 150–400)
RBC: 5.82 MIL/uL — ABNORMAL HIGH (ref 4.22–5.81)
RDW: 13.5 % (ref 11.5–15.5)
WBC: 11.5 10*3/uL — ABNORMAL HIGH (ref 4.0–10.5)
nRBC: 0 % (ref 0.0–0.2)

## 2018-10-24 LAB — BASIC METABOLIC PANEL
Anion gap: 7 (ref 5–15)
BUN: 9 mg/dL (ref 6–20)
CO2: 33 mmol/L — ABNORMAL HIGH (ref 22–32)
Calcium: 8.9 mg/dL (ref 8.9–10.3)
Chloride: 98 mmol/L (ref 98–111)
Creatinine, Ser: 0.68 mg/dL (ref 0.61–1.24)
GFR calc Af Amer: >60 mL/min (ref >60)
GFR calc non Af Amer: >60 mL/min (ref >60)
Glucose, Bld: 118 mg/dL — ABNORMAL HIGH (ref 70–99)
POTASSIUM: 4 mmol/L (ref 3.5–5.1)
Sodium: 138 mmol/L (ref 135–145)

## 2018-10-24 LAB — PROTIME-INR
INR: 1.01
Prothrombin Time: 13.2 seconds (ref 11.4–15.2)

## 2018-10-24 MED ORDER — MORPHINE SULFATE (PF) 4 MG/ML IV SOLN
4.0000 mg | Freq: Once | INTRAVENOUS | Status: AC
  Administered 2018-10-24: 4 mg via INTRAVENOUS
  Filled 2018-10-24: qty 1

## 2018-10-24 MED ORDER — ENOXAPARIN SODIUM 40 MG/0.4ML ~~LOC~~ SOLN: 1.0000 mg/kg | Freq: Two times a day (BID) | SUBCUTANEOUS | 0 refills | Status: DC

## 2018-10-24 MED ORDER — OXYCODONE HCL 5 MG PO TABS
5.0000 mg | ORAL_TABLET | Freq: Once | ORAL | Status: AC
  Administered 2018-10-24: 5 mg via ORAL
  Filled 2018-10-24: qty 1

## 2018-10-24 MED ORDER — ENOXAPARIN SODIUM 300 MG/3ML IJ SOLN
1.5000 mg/kg | Freq: Once | INTRAMUSCULAR | Status: AC
  Administered 2018-10-24: 198 mg via SUBCUTANEOUS
  Filled 2018-10-24: qty 1.98

## 2018-10-24 MED ORDER — OXYCODONE-ACETAMINOPHEN 5-325 MG PO TABS: 1.0000 | ORAL_TABLET | ORAL | 0 refills | Status: DC | PRN

## 2018-10-24 MED ORDER — FENTANYL CITRATE (PF) 100 MCG/2ML IJ SOLN: 50.0000 ug | Freq: Once | INTRAMUSCULAR | Status: DC

## 2018-10-24 MED ORDER — ONDANSETRON 4 MG PO TBDP
4.0000 mg | ORAL_TABLET | Freq: Once | ORAL | Status: AC
  Administered 2018-10-24: 4 mg via ORAL
  Filled 2018-10-24: qty 1

## 2018-10-24 MED ORDER — ACETAMINOPHEN 500 MG PO TABS
1000.0000 mg | ORAL_TABLET | Freq: Once | ORAL | Status: AC
  Administered 2018-10-24: 1000 mg via ORAL
  Filled 2018-10-24: qty 2

## 2018-10-24 NOTE — ED Triage Notes (Signed)
C/O left calf pain and swelling x 1 week.  Has history of DVT and PE.  Takes coumadin, but has stopped x 3 days this week due to hematuria ( and physician instruction).  Restarted coumadin yesterday. Takes 10 mg coumadin QD.

## 2018-10-24 NOTE — Discharge Instructions (Signed)
Take Lovenox as prescribed until your Coumadin levels are appropriate.  Follow-up with your doctor in 2 days to recheck your Coumadin levels.  Lovenox should be given twice a day starting tomorrow morning.  Take Percocet as needed for pain.  Return to the emergency room if you have chest pain or shortness of breath.

## 2018-10-24 NOTE — ED Provider Notes (Signed)
[facility] Emergency Department Provider Note  ____________________________________________  Time seen: Approximately 12:49 PM  I have reviewed the triage vital signs and the nursing notes.   HISTORY  Chief Complaint Leg Swelling   HPI Matthew [name] is a 48 y.o. 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  Patient reports that he stopped his Coumadin 3 days ago because of hematuria.  He restarted Coumadin today per recommendation of his primary care doctor.  He is complaining of 1 week of progressively worsening pain, redness, and swelling in his left lower extremity.  The pain is sharp and currently severe. No fever or chills.  He has no chest pain or shortness of breath Past Medical History:  Diagnosis Date  . Hx of blood clots   . Sleep apnea      Prior to Admission medications   Medication Sig Start Date End Date Taking? Authorizing Provider  albuterol (ACCUNEB) 1.25 MG/3ML nebulizer solution Take 1 ampule by nebulization every 6 (six) hours as needed for wheezing.    [provider]  azithromycin (ZITHROMAX) 250 MG tablet Take 500 mg first day and 250 mg daily x 4 days 10/16/18   Iloabachie, Chioma E, NP  Baclofen 5 MG TABS Take 1 tablet by mouth 3 (three) times daily. Patient not taking: Reported on 10/16/2018 09/13/18   Tommi Rumps, PA-C  benzonatate (TESSALON PERLES) 100 MG capsule Take 1 capsule (100 mg total) by mouth 2 (two) times daily. 10/16/18   Iloabachie, Chioma E, NP  cephALEXin (KEFLEX) 250 MG capsule Take 1 capsule (250 mg total) by mouth 4 (four) times daily for 10 days. 10/20/18 10/30/18  Emily Filbert, MD  enoxaparin (LOVENOX) 40 MG/0.4ML injection Inject 1.3 mLs (130 mg total) into the skin 2 (two) times daily for 7 days. 10/24/18 10/31/18  Nita Sickle, MD  HYDROcodone-acetaminophen (NORCO/VICODIN) 5-325 MG tablet Take 1 tablet by mouth  every 6 (six) hours as needed for moderate pain. Patient not taking: Reported on 10/16/2018 09/13/18   Tommi Rumps, PA-C  oxyCODONE-acetaminophen (PERCOCET) 5-325 MG tablet Take 1 tablet by mouth every 4 (four) hours as needed for severe pain. 10/24/18   Nita Sickle, MD  phenazopyridine (PYRIDIUM) 200 MG tablet Take 1 tablet (200 mg total) by mouth 3 (three) times daily as needed for pain. 10/20/18 10/20/19  Emily Filbert, MD  predniSONE (DELTASONE) 10 MG tablet Take 6 tablets  today, on day 2 take 5 tablets, day 3 take 4 tablets, day 4 take 3 tablets, day 5 take  2 tablets and 1 tablet the last day Patient not taking: Reported on 10/16/2018 09/13/18   Tommi Rumps, PA-C  predniSONE (DELTASONE) 5 MG tablet Take 8 tablets (40 mg total) by mouth daily with breakfast. 40 mg day 1, 30 mg day 2, 20 mg day 3, 10 mg day 1 10/16/18   Iloabachie, Chioma E, NP  traMADol (ULTRAM) 50 MG tablet Take 1 tablet (50 mg total) by mouth every 6 (six) hours as needed. 10/20/18 10/20/19  Emily Filbert, MD  warfarin (COUMADIN) 5 MG tablet Take 1 tablet (5 mg total) by mouth daily. 08/28/18 08/28/19  Emily Filbert, MD  warfarin (COUMADIN) 5 MG tablet Take 1 tablet (5 mg total) by mouth 2 (two) times daily for 30 days. 10/16/18 11/15/18  Iloabachie, Chioma E, NP    Allergies Patient has no known allergies.  No  family history on file.  Social History Social History   Tobacco Use  . Smoking status: Current Every Day Smoker    Packs/day: 0.50    Types: Cigarettes  . Smokeless tobacco: Never Used  Substance Use Topics  . Alcohol use: Yes    Comment: occassional  . Drug use: Never    Review of Systems  Constitutional: Negative for fever. Eyes: Negative for visual changes. ENT: Negative for sore throat. Neck: No neck pain  Cardiovascular: Negative for chest pain. Respiratory: Negative for shortness of breath. Gastrointestinal: Negative for abdominal pain, vomiting or  diarrhea. Genitourinary: Negative for dysuria. Musculoskeletal: Negative for back pain. + LLE pain, swelling, redness Skin: Negative for rash. Neurological: Negative for headaches, weakness or numbness. Psych: No SI or HI  ____________________________________________   PHYSICAL EXAM:  VITAL SIGNS: ED Triage Vitals  Enc Vitals Group     BP 10/24/18 1044 116/67     Pulse Rate 10/24/18 1044 80     Resp 10/24/18 1044 16     Temp 10/24/18 1044 98.4 F (36.9 C)     Temp Source 10/24/18 1044 Oral     SpO2 10/24/18 1044 95 %     Weight 10/24/18 1043 291 lb (132 kg)     Height 10/24/18 1043 5\' 7"  (1.702 m)     Head Circumference --      Peak Flow --      Pain Score 10/24/18 1042 8     Pain Loc --      Pain Edu? --      Excl. in GC? --     Constitutional: Alert and oriented. Well appearing and in no apparent distress. HEENT:      Head: Normocephalic and atraumatic.         Eyes: Conjunctivae are normal. Sclera is non-icteric.       Mouth/Throat: Mucous membranes are moist.       Neck: Supple with no signs of meningismus. Cardiovascular: Regular rate and rhythm. No murmurs, gallops, or rubs. 2+ symmetrical distal pulses are present in all extremities. No JVD. Respiratory: Normal respiratory effort. Lungs are clear to auscultation bilaterally. No wheezes, crackles, or rhonchi.  Gastrointestinal: Soft, non tender, and non distended with positive bowel sounds. No rebound or guarding. Musculoskeletal: Swelling, erythema, warmth, and tenderness of the LLE. Neurologic: Normal speech and language. Face is symmetric. Moving all extremities. No gross focal neurologic deficits are appreciated. Skin: Skin is warm, dry and intact. No rash noted. Psychiatric: Mood and affect are normal. Speech and behavior are normal.  ____________________________________________   LABS (all labs ordered are listed, but only abnormal results are displayed)  Labs Reviewed  CBC WITH DIFFERENTIAL/PLATELET -  Abnormal; Notable for the following components:      Result Value   WBC 11.5 (*)    RBC 5.82 (*)    Hemoglobin 18.2 (*)    HCT 56.2 (*)    All other components within normal limits  BASIC METABOLIC PANEL - Abnormal; Notable for the following components:   CO2 33 (*)    Glucose, Bld 118 (*)    All other components within normal limits  PROTIME-INR   ____________________________________________  EKG  none  ____________________________________________  RADIOLOGY  I have personally reviewed the images performed during this visit and I agree with the Radiologist's read.   Interpretation by Radiologist:  [place] Venous Img Lower Unilateral Left  Result Date: 10/24/2018 CLINICAL DATA:  Left lower extremity pain and edema. History of DVT and pulmonary  embolism. Evaluate for acute or chronic DVT. EXAM: LEFT LOWER EXTREMITY VENOUS DOPPLER ULTRASOUND TECHNIQUE: Gray-scale sonography with graded compression, as well as color Doppler and duplex ultrasound were performed to evaluate the lower extremity deep venous systems from the level of the common femoral vein and including the common femoral, femoral, profunda femoral, popliteal and calf veins including the posterior tibial, peroneal and gastrocnemius veins when visible. The superficial great saphenous vein was also interrogated. Spectral Doppler was utilized to evaluate flow at rest and with distal augmentation maneuvers in the common femoral, femoral and popliteal veins. COMPARISON:  Left lower extremity venous Doppler ultrasound-08/28/2018 FINDINGS: Contralateral Common Femoral Vein: Respiratory phasicity is normal and symmetric with the symptomatic side. No evidence of thrombus. Normal compressibility. Common Femoral Vein: No evidence of thrombus. Normal compressibility, respiratory phasicity and response to augmentation. Saphenofemoral Junction: No evidence of thrombus. Normal compressibility and flow on color Doppler imaging. Profunda Femoral  Vein: No evidence of thrombus. Normal compressibility and flow on color Doppler imaging. Femoral Vein: There is nonocclusive mixed echogenic thrombus seen with the proximal aspect of the left femoral vein (images 9 and 19), new compared to the 08/2018 examination. The mid and distal aspects of the femoral vein appear atretic though patent, unchanged. Popliteal Vein: Grossly unchanged mixed echogenic near occlusive DVT within the left popliteal vein (images 31 through 34). Calf Veins: Not well visualized Superficial Great Saphenous Vein: No evidence of thrombus. Normal compressibility. Other Findings:  None. IMPRESSION: 1. Nonocclusive mixed echogenic thrombus within the proximal left femoral vein, apparently new compared to the 08/2018 examination though appears at least subacute. Correlation advised. 2. 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 Electronically Signed   By: Simonne Come M.D.   On: 10/24/2018 12:13     ____________________________________________   PROCEDURES  Procedure(s) performed: None Procedures Critical Care performed:  None ____________________________________________   INITIAL IMPRESSION / ASSESSMENT AND PLAN / ED COURSE  49 y.o. male with a history of PE and DVTs on Coumadin who presents for evaluation of left lower extremity pain and swelling in the setting of stopping his coumadin for hematuria.  Hematuria has resolved. Patient re-started coumadin today. [place] is positive for acute DVT. No clinical signs of PE with no tachycardia, tachypnea, hypoxia, chest pain or shortness of breath.  Will check INR and if that is subtherapeutic will start patient on Lovenox as a bridge to Coumadin to prevent PE.    _________________________ 2:20 PM on 10/24/2018 -----------------------------------------  Patient was given Lovenox.  Will discharge home on Lovenox as bridge to Coumadin.  Discussed return precautions for any signs of PE.   Recommended close follow-up with his doctor on Monday for recheck of his INR.   As part of my medical decision making, I reviewed the following data within the electronic MEDICAL RECORD NUMBER Nursing notes reviewed and incorporated, Labs reviewed , Old chart reviewed, Radiograph reviewed , Notes from prior ED visits and [place] Controlled Substance Database    Pertinent labs & imaging results that were available during my care of the patient were reviewed by me and considered in my medical decision making (see chart for details).    ____________________________________________   FINAL CLINICAL IMPRESSION(S) / ED DIAGNOSES  Final diagnoses:  Acute deep vein thrombosis (DVT) of left lower extremity, unspecified vein (HCC)      NEW MEDICATIONS STARTED DURING THIS VISIT:  ED Discharge Orders         Ordered    enoxaparin (  LOVENOX) 40 MG/0.4ML injection  2 times daily,   Status:  Discontinued     10/24/18 1414    oxyCODONE-acetaminophen (PERCOCET) 5-325 MG tablet  Every 4 hours PRN     10/24/18 1414    enoxaparin (LOVENOX) 40 MG/0.4ML injection  2 times daily     10/24/18 1418           Note:  This document was prepared using Dragon voice recognition software and may include unintentional dictation errors.    Don PerkingVeronese, [place], MD 10/24/18 1420

## 2018-10-24 NOTE — ED Notes (Addendum)
Pt is in [place], technician will bring him when completed.

## 2018-10-24 NOTE — ED Notes (Signed)
NAD noted at time of D/C. Pt taken to lobby via wheelchair. Pt denies comments/concerns regarding D/C instructions.  

## 2018-10-29 ENCOUNTER — Other Ambulatory Visit: Payer: Self-pay

## 2018-10-29 DIAGNOSIS — I825Y2 Chronic embolism and thrombosis of unspecified deep veins of left proximal lower extremity: Secondary | ICD-10-CM

## 2018-10-29 LAB — POCT INR: INR: 2 (ref 2.0–3.0)

## 2018-11-06 ENCOUNTER — Ambulatory Visit: Payer: Self-pay | Admitting: Gerontology

## 2018-11-06 ENCOUNTER — Encounter: Payer: Self-pay | Admitting: Gerontology

## 2018-11-06 ENCOUNTER — Other Ambulatory Visit: Payer: Self-pay

## 2018-11-06 VITALS — BP 123/81 | HR 86 | Ht 67.0 in | Wt 301.2 lb

## 2018-11-06 DIAGNOSIS — I825Y2 Chronic embolism and thrombosis of unspecified deep veins of left proximal lower extremity: Secondary | ICD-10-CM

## 2018-11-06 DIAGNOSIS — Z0000 Encounter for general adult medical examination without abnormal findings: Secondary | ICD-10-CM

## 2018-11-06 DIAGNOSIS — R600 Localized edema: Secondary | ICD-10-CM

## 2018-11-06 LAB — POCT INR: INR: 1.5 — AB (ref 2.0–3.0)

## 2018-11-06 MED ORDER — FUROSEMIDE 20 MG PO TABS: 20.0000 mg | ORAL_TABLET | Freq: Every day | ORAL | 0 refills | Status: DC

## 2018-11-06 NOTE — Progress Notes (Signed)
Established Patient Office Visit  Subjective:  Patient ID: Matthew [name], male    DOB: 06-19-1971  Age: 48 y.o. MRN: [id number]  CC:  Chief Complaint  Patient presents with  . Follow-up    HPI Matthew [name] presents for follow up post ED visit on 10/24/18 for DVT to his left lower leg. He states that his left leg is still swollen but not tight. He denies claudication and hematuria. He states that he forgot to take yesterday's evening and today's morning dose of 40 mg Lovenox injection, and he has only taking 40 mg Lovenox injection for 5 days instead of 7 days. His PT/INR will be checked during todays visit. He reports that he takes coumadin 5 mg bid. He denies chest pain, palpitation and shortness of breath. DopplerU/S to left lower leg on 10/24/18 showed Nonocclusive mixed echogenic thrombus within the proximal left femoral vein, apparently new compared to 08/2018 examination though appears at least subacute, per Dr Odis Luster.  He was seen last on 10/16/18 for follow up after testing positive for Influenza B virus. He didn't follow up with ordered chest x ray nor pick up his antibiotics. He states that he feels better and currently denies cough, rhinorrhea, wheezing, fever nor chills.  Anxiety: He states that he experiences mild intermittent anxiety because his sister in law lives in his house and he needs something to calm him down. He states that his mood is good and denies any suicidal or homicidal ideation.  He reports that he monitors his diet, denies, nausea, vomiting, diarrhea and right upper quadrant abdominal. Otherwise he denies denies further concern.  Past Medical History:  Diagnosis Date  . Hx of blood clots   . Sleep apnea     History reviewed. No pertinent surgical history.  History reviewed. No pertinent family history.  Social History   Socioeconomic History  . Marital status: Married    Spouse name: Not on file  . Number of children: Not on file  . Years of  education: Not on file  . Highest education level: Not on file  Occupational History  . Not on file  Social Needs  . Financial resource strain: Not on file  . Food insecurity:    Worry: Not on file    Inability: Not on file  . Transportation needs:    Medical: Not on file    Non-medical: Not on file  Tobacco Use  . Smoking status: Current Every Day Smoker    Packs/day: 0.50    Types: Cigarettes  . Smokeless tobacco: Never Used  Substance and Sexual Activity  . Alcohol use: Yes    Comment: occassional  . Drug use: Never  . Sexual activity: Yes  Lifestyle  . Physical activity:    Days per week: Not on file    Minutes per session: Not on file  . Stress: Not on file  Relationships  . Social connections:    Talks on phone: Not on file    Gets together: Not on file    Attends religious service: Not on file    Active member of club or organization: Not on file    Attends meetings of clubs or organizations: Not on file    Relationship status: Not on file  . Intimate partner violence:    Fear of current or ex partner: Not on file    Emotionally abused: Not on file    Physically abused: Not on file    Forced sexual activity: Not on  file  Other Topics Concern  . Not on file  Social History Narrative  . Not on file    Outpatient Medications Prior to Visit  Medication Sig Dispense Refill  . warfarin (COUMADIN) 5 MG tablet Take 1 tablet (5 mg total) by mouth 2 (two) times daily for 30 days. 60 tablet 0  . warfarin (COUMADIN) 5 MG tablet Take 1 tablet (5 mg total) by mouth daily. 30 tablet 11  . albuterol (ACCUNEB) 1.25 MG/3ML nebulizer solution Take 1 ampule by nebulization every 6 (six) hours as needed for wheezing.    . benzonatate (TESSALON PERLES) 100 MG capsule Take 1 capsule (100 mg total) by mouth 2 (two) times daily. (Patient not taking: Reported on 11/06/2018) 20 capsule 0  . enoxaparin (LOVENOX) 40 MG/0.4ML injection Inject 1.3 mLs (130 mg total) into the skin 2 (two)  times daily for 7 days. 40 mL 0  . HYDROcodone-acetaminophen (NORCO/VICODIN) 5-325 MG tablet Take 1 tablet by mouth every 6 (six) hours as needed for moderate pain. (Patient not taking: Reported on 10/16/2018) 12 tablet 0  . oxyCODONE-acetaminophen (PERCOCET) 5-325 MG tablet Take 1 tablet by mouth every 4 (four) hours as needed for severe pain. (Patient not taking: Reported on 11/06/2018) 12 tablet 0  . phenazopyridine (PYRIDIUM) 200 MG tablet Take 1 tablet (200 mg total) by mouth 3 (three) times daily as needed for pain. (Patient not taking: Reported on 11/06/2018) 20 tablet 0  . predniSONE (DELTASONE) 10 MG tablet Take 6 tablets  today, on day 2 take 5 tablets, day 3 take 4 tablets, day 4 take 3 tablets, day 5 take  2 tablets and 1 tablet the last day (Patient not taking: Reported on 10/16/2018) 21 tablet 0  . predniSONE (DELTASONE) 5 MG tablet Take 8 tablets (40 mg total) by mouth daily with breakfast. 40 mg day 1, 30 mg day 2, 20 mg day 3, 10 mg day 1 (Patient not taking: Reported on 11/06/2018) 20 tablet 0  . traMADol (ULTRAM) 50 MG tablet Take 1 tablet (50 mg total) by mouth every 6 (six) hours as needed. (Patient not taking: Reported on 11/06/2018) 12 tablet 0  . azithromycin (ZITHROMAX) 250 MG tablet Take 500 mg first day and 250 mg daily x 4 days (Patient not taking: Reported on 11/06/2018) 6 each 0  . Baclofen 5 MG TABS Take 1 tablet by mouth 3 (three) times daily. (Patient not taking: Reported on 10/16/2018) 21 tablet 0   No facility-administered medications prior to visit.     No Known Allergies  ROS Review of Systems  Constitutional: Negative.   HENT: Negative.   Eyes: Negative.   Respiratory: Negative.   Cardiovascular: Positive for leg swelling (left lower leg).  Gastrointestinal: Negative.   Genitourinary: Negative.   Musculoskeletal: Negative.   Skin: Negative.   Neurological: Negative.   Hematological: Bruises/bleeds easily (to lower abdomen due to Lovenox injection).   Psychiatric/Behavioral: Negative.  Nervous/anxious: stressors due to sister in-law living in his house.       Objective:    Physical Exam  Constitutional: He is oriented to person, place, and time. He appears well-developed and well-nourished.  obese  HENT:  Head: Normocephalic and atraumatic.  Eyes: Pupils are equal, round, and reactive to light. EOM are normal.  Neck: Normal range of motion.  Cardiovascular: Normal rate and regular rhythm.  Pulmonary/Chest: Effort normal and breath sounds normal.  Abdominal: Soft. Bowel sounds are normal.  Musculoskeletal:        General: Edema: +  2 left lower leg.  Neurological: He is alert and oriented to person, place, and time.  Skin: Skin is warm.  Psychiatric: He has a normal mood and affect. His behavior is normal. Judgment and thought content normal.    BP 123/81 (BP Location: Left Arm, Patient Position: Sitting)   Pulse 86   Ht 5\' 7"  (1.702 m)   Wt (!) 301 lb 3.2 oz (136.6 kg)   SpO2 94%   BMI 47.17 kg/m  Wt Readings from Last 3 Encounters:  11/06/18 (!) 301 lb 3.2 oz (136.6 kg)  10/24/18 291 lb (132 kg)  10/20/18 291 lb (132 kg)   He was encouraged to lose weight, decrease caloric intake, exercise 30 minutes daily.  Health Maintenance Due  Topic Date Due  . HIV Screening  07/09/1985  . TETANUS/TDAP  07/09/1989  . INFLUENZA VACCINE  04/03/2018    He declined influenza vaccine, and he tested positive with Influenza B virus. He was provided with HIV screening information.  Lab Results  Component Value Date   TSH 3.650 09/10/2018   Lab Results  Component Value Date   WBC 11.5 (H) 10/24/2018   HGB 18.2 (H) 10/24/2018   HCT 56.2 (H) 10/24/2018   MCV 96.6 10/24/2018   PLT 193 10/24/2018   Lab Results  Component Value Date   NA 138 10/24/2018   K 4.0 10/24/2018   CO2 33 (H) 10/24/2018   GLUCOSE 118 (H) 10/24/2018   BUN 9 10/24/2018   CREATININE 0.68 10/24/2018   BILITOT 0.2 09/10/2018   ALKPHOS 160 (H)  09/10/2018   AST 17 09/10/2018   ALT 13 09/10/2018   PROT 7.1 09/10/2018   ALBUMIN 3.6 09/10/2018   CALCIUM 8.9 10/24/2018   ANIONGAP 7 10/24/2018   Lab Results  Component Value Date   CHOL 155 09/10/2018   Lab Results  Component Value Date   HDL 38 (L) 09/10/2018   He was encouraged to continue on low fat low cholesterol diet, exercise 30 minutes daily.  Lab Results  Component Value Date   LDLCALC 84 09/10/2018   Lab Results  Component Value Date   TRIG 163 (H) 09/10/2018   Lab Results  Component Value Date   CHOLHDL 4.1 09/10/2018   Lab Results  Component Value Date   HGBA1C 5.9 (H) 09/10/2018   He was advised to continue on low carb diet, decrease caloric intake and continue on weight loss regimen.   Assessment & Plan:     Meds ordered this encounter  Medications  . furosemide (LASIX) 20 MG tablet    Sig: Take 1 tablet (20 mg total) by mouth daily.    Dispense:  30 tablet    Refill:  0   1. Chronic deep vein thrombosis (DVT) of proximal vein of left lower extremity [facility]) - Mr. Ramberg is non compliant with his Lovenox injections and has missed several appointments to monitor his PT/INR. His PT/INR was 18.7/1.5 during office visit. He was encouraged to complete 7 day course of Lovenox injection, and continue on 5 mg coumadin bid. Will recheck PT/INR next week 11/13/18. He will notify clinic for worsening symptoms and was encouraged to take medications as prescribed. - POCT PT/ INR; Future  2. Edema Left lower leg - He was encouraged to elevate left leg while sitting down, wear compression stockings and he will start on 20 mg lasix and will follow up in 2 weeks. - furosemide (LASIX) 20 MG tablet; Take 1 tablet (20 mg total) by  mouth daily.  Dispense: 30 tablet; Refill: 0  3. Morbid obesity (HCC) - He was seriously encouraged to lose weight, decrease caloric intake, and exercise 30 minutes daily.  4. Health care maintenance - He was provided with HIV  screening information and advised to get TDAP from health department.  5. Anxiety: - He will follow up with Ms. Simpson for behavioral health.  Follow-up: Return in about 2 weeks (around 11/20/2018), or if symptoms worsen or fail to improve.    Bexley Laubach Trellis Paganini, NP

## 2018-11-06 NOTE — Patient Instructions (Signed)
DASH Eating Plan  DASH stands for "Dietary Approaches to Stop Hypertension." The DASH eating plan is a healthy eating plan that has been shown to reduce high blood pressure (hypertension). It may also reduce your risk for type 2 diabetes, heart disease, and stroke. The DASH eating plan may also help with weight loss.  What are tips for following this plan?    General guidelines   Avoid eating more than 2,300 mg (milligrams) of salt (sodium) a day. If you have hypertension, you may need to reduce your sodium intake to 1,500 mg a day.   Limit alcohol intake to no more than 1 drink a day for nonpregnant women and 2 drinks a day for men. One drink equals 12 oz of beer, 5 oz of wine, or 1 oz of hard liquor.   Work with your health care provider to maintain a healthy body weight or to lose weight. Ask what an ideal weight is for you.   Get at least 30 minutes of exercise that causes your heart to beat faster (aerobic exercise) most days of the week. Activities may include walking, swimming, or biking.   Work with your health care provider or diet and nutrition specialist (dietitian) to adjust your eating plan to your individual calorie needs.  Reading food labels     Check food labels for the amount of sodium per serving. Choose foods with less than 5 percent of the Daily Value of sodium. Generally, foods with less than 300 mg of sodium per serving fit into this eating plan.   To find whole grains, look for the word "whole" as the first word in the ingredient list.  Shopping   Buy products labeled as "low-sodium" or "no salt added."   Buy fresh foods. Avoid canned foods and premade or frozen meals.  Cooking   Avoid adding salt when cooking. Use salt-free seasonings or herbs instead of table salt or sea salt. Check with your health care provider or pharmacist before using salt substitutes.   Do not fry foods. Cook foods using healthy methods such as baking, boiling, grilling, and broiling instead.   Cook with  heart-healthy oils, such as olive, canola, soybean, or sunflower oil.  Meal planning   Eat a balanced diet that includes:  ? 5 or more servings of fruits and vegetables each day. At each meal, try to fill half of your plate with fruits and vegetables.  ? Up to 6-8 servings of whole grains each day.  ? Less than 6 oz of lean meat, poultry, or fish each day. A 3-oz serving of meat is about the same size as a deck of cards. One egg equals 1 oz.  ? 2 servings of low-fat dairy each day.  ? A serving of nuts, seeds, or beans 5 times each week.  ? Heart-healthy fats. Healthy fats called Omega-3 fatty acids are found in foods such as flaxseeds and coldwater fish, like sardines, salmon, and mackerel.   Limit how much you eat of the following:  ? Canned or prepackaged foods.  ? Food that is high in trans fat, such as fried foods.  ? Food that is high in saturated fat, such as fatty meat.  ? Sweets, desserts, sugary drinks, and other foods with added sugar.  ? Full-fat dairy products.   Do not salt foods before eating.   Try to eat at least 2 vegetarian meals each week.   Eat more home-cooked food and less restaurant, buffet, and fast food.     When eating at a restaurant, ask that your food be prepared with less salt or no salt, if possible.  What foods are recommended?  The items listed may not be a complete list. Talk with your dietitian about what dietary choices are best for you.  Grains  Whole-grain or whole-wheat bread. Whole-grain or whole-wheat pasta. Brown rice. Oatmeal. Quinoa. Bulgur. Whole-grain and low-sodium cereals. Pita bread. Low-fat, low-sodium crackers. Whole-wheat flour tortillas.  Vegetables  Fresh or frozen vegetables (raw, steamed, roasted, or grilled). Low-sodium or reduced-sodium tomato and vegetable juice. Low-sodium or reduced-sodium tomato sauce and tomato paste. Low-sodium or reduced-sodium canned vegetables.  Fruits  All fresh, dried, or frozen fruit. Canned fruit in natural juice (without  added sugar).  Meat and other protein foods  Skinless chicken or turkey. Ground chicken or turkey. Pork with fat trimmed off. Fish and seafood. Egg whites. Dried beans, peas, or lentils. Unsalted nuts, nut butters, and seeds. Unsalted canned beans. Lean cuts of beef with fat trimmed off. Low-sodium, lean deli meat.  Dairy  Low-fat (1%) or fat-free (skim) milk. Fat-free, low-fat, or reduced-fat cheeses. Nonfat, low-sodium ricotta or cottage cheese. Low-fat or nonfat yogurt. Low-fat, low-sodium cheese.  Fats and oils  Soft margarine without trans fats. Vegetable oil. Low-fat, reduced-fat, or light mayonnaise and salad dressings (reduced-sodium). Canola, safflower, olive, soybean, and sunflower oils. Avocado.  Seasoning and other foods  Herbs. Spices. Seasoning mixes without salt. Unsalted popcorn and pretzels. Fat-free sweets.  What foods are not recommended?  The items listed may not be a complete list. Talk with your dietitian about what dietary choices are best for you.  Grains  Baked goods made with fat, such as croissants, muffins, or some breads. Dry pasta or rice meal packs.  Vegetables  Creamed or fried vegetables. Vegetables in a cheese sauce. Regular canned vegetables (not low-sodium or reduced-sodium). Regular canned tomato sauce and paste (not low-sodium or reduced-sodium). Regular tomato and vegetable juice (not low-sodium or reduced-sodium). Pickles. Olives.  Fruits  Canned fruit in a light or heavy syrup. Fried fruit. Fruit in cream or butter sauce.  Meat and other protein foods  Fatty cuts of meat. Ribs. Fried meat. Bacon. Sausage. Bologna and other processed lunch meats. Salami. Fatback. Hotdogs. Bratwurst. Salted nuts and seeds. Canned beans with added salt. Canned or smoked fish. Whole eggs or egg yolks. Chicken or turkey with skin.  Dairy  Whole or 2% milk, cream, and half-and-half. Whole or full-fat cream cheese. Whole-fat or sweetened yogurt. Full-fat cheese. Nondairy creamers. Whipped toppings.  Processed cheese and cheese spreads.  Fats and oils  Butter. Stick margarine. Lard. Shortening. Ghee. Bacon fat. Tropical oils, such as coconut, palm kernel, or palm oil.  Seasoning and other foods  Salted popcorn and pretzels. Onion salt, garlic salt, seasoned salt, table salt, and sea salt. Worcestershire sauce. Tartar sauce. Barbecue sauce. Teriyaki sauce. Soy sauce, including reduced-sodium. Steak sauce. Canned and packaged gravies. Fish sauce. Oyster sauce. Cocktail sauce. Horseradish that you find on the shelf. Ketchup. Mustard. Meat flavorings and tenderizers. Bouillon cubes. Hot sauce and Tabasco sauce. Premade or packaged marinades. Premade or packaged taco seasonings. Relishes. Regular salad dressings.  Where to find more information:   National Heart, Lung, and Blood Institute: www.nhlbi.nih.gov   American Heart Association: www.heart.org  Summary   The DASH eating plan is a healthy eating plan that has been shown to reduce high blood pressure (hypertension). It may also reduce your risk for type 2 diabetes, heart disease, and stroke.   With the   DASH eating plan, you should limit salt (sodium) intake to 2,300 mg a day. If you have hypertension, you may need to reduce your sodium intake to 1,500 mg a day.   When on the DASH eating plan, aim to eat more fresh fruits and vegetables, whole grains, lean proteins, low-fat dairy, and heart-healthy fats.   Work with your health care provider or diet and nutrition specialist (dietitian) to adjust your eating plan to your individual calorie needs.  This information is not intended to replace advice given to you by your health care provider. Make sure you discuss any questions you have with your health care provider.  Document Released: 08/09/2011 Document Revised: 08/13/2016 Document Reviewed: 08/13/2016  Elsevier Interactive Patient Education  2019 [employer]

## 2018-11-13 ENCOUNTER — Other Ambulatory Visit: Payer: Self-pay

## 2018-11-13 ENCOUNTER — Other Ambulatory Visit: Payer: Self-pay | Admitting: Gerontology

## 2018-11-13 DIAGNOSIS — Z5181 Encounter for therapeutic drug level monitoring: Secondary | ICD-10-CM

## 2018-11-13 DIAGNOSIS — Z7901 Long term (current) use of anticoagulants: Principal | ICD-10-CM

## 2018-11-13 DIAGNOSIS — I825Y2 Chronic embolism and thrombosis of unspecified deep veins of left proximal lower extremity: Secondary | ICD-10-CM

## 2018-11-13 MED ORDER — WARFARIN SODIUM 5 MG PO TABS: 10.0000 mg | ORAL_TABLET | Freq: Once | ORAL | 2 refills | Status: DC

## 2018-11-13 NOTE — Progress Notes (Signed)
Mr Daniely's INR was 1.4 and PT 16.4, he was advised to take 15 mg Coumadin today and continue on 10 mg at evening and INR/PT will be rechecked next week on 11/20/18.

## 2018-11-19 ENCOUNTER — Encounter: Payer: Self-pay | Admitting: *Deleted

## 2018-11-19 ENCOUNTER — Inpatient Hospital Stay
Admission: EM | Admit: 2018-11-19 | Discharge: 2018-11-20 | DRG: 300 | Disposition: A | Discharge status: Home / Self Care | Payer: Self-pay | Attending: Internal Medicine | Admitting: Internal Medicine

## 2018-11-19 ENCOUNTER — Other Ambulatory Visit: Payer: Self-pay

## 2018-11-19 ENCOUNTER — Emergency Department: Payer: Self-pay

## 2018-11-19 ENCOUNTER — Encounter (INDEPENDENT_AMBULATORY_CARE_PROVIDER_SITE_OTHER): Payer: Self-pay

## 2018-11-19 DIAGNOSIS — F17210 Nicotine dependence, cigarettes, uncomplicated: Secondary | ICD-10-CM | POA: Diagnosis present

## 2018-11-19 DIAGNOSIS — I82432 Acute embolism and thrombosis of left popliteal vein: Secondary | ICD-10-CM | POA: Diagnosis present

## 2018-11-19 DIAGNOSIS — Z79891 Long term (current) use of opiate analgesic: Secondary | ICD-10-CM

## 2018-11-19 DIAGNOSIS — Z23 Encounter for immunization: Secondary | ICD-10-CM

## 2018-11-19 DIAGNOSIS — M79605 Pain in left leg: Secondary | ICD-10-CM

## 2018-11-19 DIAGNOSIS — Z716 Tobacco abuse counseling: Secondary | ICD-10-CM

## 2018-11-19 DIAGNOSIS — G4730 Sleep apnea, unspecified: Secondary | ICD-10-CM | POA: Diagnosis present

## 2018-11-19 DIAGNOSIS — I82411 Acute embolism and thrombosis of right femoral vein: Secondary | ICD-10-CM | POA: Diagnosis present

## 2018-11-19 DIAGNOSIS — Z7901 Long term (current) use of anticoagulants: Secondary | ICD-10-CM

## 2018-11-19 DIAGNOSIS — Z79899 Other long term (current) drug therapy: Secondary | ICD-10-CM

## 2018-11-19 DIAGNOSIS — Z6841 Body Mass Index (BMI) 40.0 and over, adult: Secondary | ICD-10-CM

## 2018-11-19 DIAGNOSIS — I82402 Acute embolism and thrombosis of unspecified deep veins of left lower extremity: Secondary | ICD-10-CM

## 2018-11-19 DIAGNOSIS — I8002 Phlebitis and thrombophlebitis of superficial vessels of left lower extremity: Secondary | ICD-10-CM | POA: Diagnosis present

## 2018-11-19 DIAGNOSIS — Z86718 Personal history of other venous thrombosis and embolism: Secondary | ICD-10-CM

## 2018-11-19 DIAGNOSIS — I82409 Acute embolism and thrombosis of unspecified deep veins of unspecified lower extremity: Secondary | ICD-10-CM | POA: Diagnosis present

## 2018-11-19 DIAGNOSIS — I82412 Acute embolism and thrombosis of left femoral vein: Principal | ICD-10-CM | POA: Diagnosis present

## 2018-11-19 LAB — BASIC METABOLIC PANEL
ANION GAP: 8 (ref 5–15)
BUN: 15 mg/dL (ref 6–20)
CO2: 30 mmol/L (ref 22–32)
Calcium: 8.7 mg/dL — ABNORMAL LOW (ref 8.9–10.3)
Chloride: 101 mmol/L (ref 98–111)
Creatinine, Ser: 0.77 mg/dL (ref 0.61–1.24)
GFR calc Af Amer: >60 mL/min (ref >60)
GFR calc non Af Amer: >60 mL/min (ref >60)
Glucose, Bld: 120 mg/dL — ABNORMAL HIGH (ref 70–99)
POTASSIUM: 4.6 mmol/L (ref 3.5–5.1)
Sodium: 139 mmol/L (ref 135–145)

## 2018-11-19 LAB — CBC
HCT: 57.4 % — ABNORMAL HIGH (ref 39.0–52.0)
Hemoglobin: 18.9 g/dL — ABNORMAL HIGH (ref 13.0–17.0)
MCH: 31.7 pg (ref 26.0–34.0)
MCHC: 32.9 g/dL (ref 30.0–36.0)
MCV: 96.3 fL (ref 80.0–100.0)
NRBC: 0 % (ref 0.0–0.2)
Platelets: 203 10*3/uL (ref 150–400)
RBC: 5.96 MIL/uL — ABNORMAL HIGH (ref 4.22–5.81)
RDW: 14.4 % (ref 11.5–15.5)
WBC: 11.9 10*3/uL — ABNORMAL HIGH (ref 4.0–10.5)

## 2018-11-19 LAB — APTT: aPTT: 41 seconds — ABNORMAL HIGH (ref 24–36)

## 2018-11-19 LAB — PROTIME-INR
INR: 2.9 — ABNORMAL HIGH (ref 0.8–1.2)
Prothrombin Time: 29.6 seconds — ABNORMAL HIGH (ref 11.4–15.2)

## 2018-11-19 MED ORDER — ACETAMINOPHEN 650 MG RE SUPP: 650.0000 mg | Freq: Four times a day (QID) | RECTAL | Status: DC | PRN

## 2018-11-19 MED ORDER — TAMSULOSIN HCL 0.4 MG PO CAPS
0.4000 mg | ORAL_CAPSULE | Freq: Every day | ORAL | Status: DC
  Administered 2018-11-19: 0.4 mg via ORAL
  Filled 2018-11-19: qty 1

## 2018-11-19 MED ORDER — HEPARIN BOLUS VIA INFUSION
5000.0000 [IU] | Freq: Once | INTRAVENOUS | Status: AC
  Administered 2018-11-19: 5000 [IU] via INTRAVENOUS
  Filled 2018-11-19: qty 5000

## 2018-11-19 MED ORDER — ACETAMINOPHEN 325 MG PO TABS: 650.0000 mg | ORAL_TABLET | Freq: Four times a day (QID) | ORAL | Status: DC | PRN

## 2018-11-19 MED ORDER — ONDANSETRON HCL 4 MG PO TABS: 4.0000 mg | ORAL_TABLET | Freq: Four times a day (QID) | ORAL | Status: DC | PRN

## 2018-11-19 MED ORDER — INFLUENZA VAC SPLIT QUAD 0.5 ML IM SUSY
0.5000 mL | PREFILLED_SYRINGE | INTRAMUSCULAR | Status: AC
  Administered 2018-11-20: 0.5 mL via INTRAMUSCULAR
  Filled 2018-11-19: qty 0.5

## 2018-11-19 MED ORDER — HYDROMORPHONE HCL 1 MG/ML IJ SOLN
1.0000 mg | Freq: Once | INTRAMUSCULAR | Status: AC
  Administered 2018-11-19: 1 mg via INTRAMUSCULAR
  Filled 2018-11-19: qty 1

## 2018-11-19 MED ORDER — MORPHINE SULFATE (PF) 4 MG/ML IV SOLN
4.0000 mg | INTRAVENOUS | Status: DC | PRN
  Administered 2018-11-19 – 2018-11-20 (×2): 4 mg via INTRAVENOUS
  Filled 2018-11-19 (×2): qty 1

## 2018-11-19 MED ORDER — OXYCODONE HCL 5 MG PO TABS
5.0000 mg | ORAL_TABLET | ORAL | Status: DC | PRN
  Administered 2018-11-19 – 2018-11-20 (×2): 5 mg via ORAL
  Filled 2018-11-19 (×2): qty 1

## 2018-11-19 MED ORDER — HEPARIN (PORCINE) 25000 UT/250ML-% IV SOLN
1750.0000 [IU]/h | INTRAVENOUS | Status: DC
  Administered 2018-11-19: 1600 [IU]/h via INTRAVENOUS
  Filled 2018-11-19: qty 250

## 2018-11-19 MED ORDER — SODIUM CHLORIDE 0.9 % IV SOLN
INTRAVENOUS | Status: AC
  Administered 2018-11-19: 23:00:00 via INTRAVENOUS

## 2018-11-19 MED ORDER — IOPAMIDOL (ISOVUE-370) INJECTION 76%
100.0000 mL | Freq: Once | INTRAVENOUS | Status: AC | PRN
  Administered 2018-11-19: 100 mL via INTRAVENOUS

## 2018-11-19 MED ORDER — ONDANSETRON HCL 4 MG/2ML IJ SOLN: 4.0000 mg | Freq: Four times a day (QID) | INTRAMUSCULAR | Status: DC | PRN

## 2018-11-19 NOTE — ED Notes (Signed)
Pt cleared by MD to have PO liquid. Pt given Ice water by this [employer]

## 2018-11-19 NOTE — ED Notes (Signed)
Kala RN, aware of bed assigned  

## 2018-11-19 NOTE — Progress Notes (Signed)
ANTICOAGULATION CONSULT NOTE - Initial Consult  Pharmacy Consult for Heparin  Indication: DVT  No Known Allergies  Patient Measurements: Height: 5\' 7"  (170.2 cm) Weight: (!) 301 lb (136.5 kg) IBW/kg (Calculated) : 66.1 Heparin Dosing Weight:  98.8 kg   Vital Signs: Temp: 99.2 F (37.3 C) (03/18 1625) Temp Source: Oral (03/18 1625) BP: 105/77 (03/18 1900) Pulse Rate: 68 (03/18 1830)  Labs: Recent Labs    11/19/18 1651  HGB 18.9*  HCT 57.4*  PLT 203  LABPROT 29.6*  INR 2.9*  CREATININE 0.77    Estimated Creatinine Clearance: 150.6 mL/min (by C-G formula based on SCr of 0.77 mg/dL).   Medical History: Past Medical History:  Diagnosis Date  . Hx of blood clots   . Sleep apnea     Medications:  (Not in a hospital admission)   Assessment: Pharmacy consulted to dose heparin in this 48 year old male with DVT.  Was treated from 2/21 - 2/28 with lovenox 130 mg SQ Q12H and then transitioned to warfarin.   Pt was on warfarin 15 mg PO daily , last dose was on 3/17.   Pt presents today with new pain in left leg, concerned could be a new DVT.   Pt is currently being considered for thrombolysis.  3/18:  INR = 2.9  Goal of Therapy:  Heparin level 0.3-0.7 units/ml Monitor platelets by anticoagulation protocol: Yes   Plan:  Give 5000 units bolus x 1 Start heparin infusion at 1600 units/hr Check anti-Xa level in 6 hours and daily while on heparin Continue to monitor H&H and platelets  Shacora Zynda D 11/19/2018,9:15 PM

## 2018-11-19 NOTE — ED Notes (Signed)
Heparin verified by Rebecca RN 

## 2018-11-19 NOTE — ED Notes (Signed)
Patient did want 2nd IV started and lab had called primary nurse for repeat blue top to be drawn and IV was not drawing back. This nurse made another nurse aware.

## 2018-11-19 NOTE — ED Provider Notes (Signed)
[facility] Emergency Department Provider Note  Time seen: 4:46 PM  I have reviewed the triage vital signs and the nursing notes.   HISTORY  Chief Complaint Leg Pain    HPI Matthew [name] is a 48 y.o. male with a past medical history of DVT, prior PE, presents to the emergency department with left leg pain.  According to the patient 2012 he was diagnosed with DVT, 2018 had a DVT/PE, and 3 weeks ago was diagnosed with a left lower leg blood clot.  Patient is currently taking warfarin.  States over the past several days he has had increased pain now in the left upper leg between his knee and hip especially over the medial aspect of the leg.  Denies any fever.  No chest pain or shortness of breath.  Patient concerned that he could have a new or enlarging blood clot.   Past Medical History:  Diagnosis Date  . Hx of blood clots   . Sleep apnea     There are no active problems to display for this patient.   No past surgical history on file.  Prior to Admission medications   Medication Sig Start Date End Date Taking? Authorizing Provider  albuterol (ACCUNEB) 1.25 MG/3ML nebulizer solution Take 1 ampule by nebulization every 6 (six) hours as needed for wheezing.    [provider]  benzonatate (TESSALON PERLES) 100 MG capsule Take 1 capsule (100 mg total) by mouth 2 (two) times daily. Patient not taking: Reported on 11/06/2018 10/16/18   Iloabachie, Chioma E, NP  enoxaparin (LOVENOX) 40 MG/0.4ML injection Inject 1.3 mLs (130 mg total) into the skin 2 (two) times daily for 7 days. 10/24/18 10/31/18  Nita Sickle, MD  furosemide (LASIX) 20 MG tablet Take 1 tablet (20 mg total) by mouth daily. 11/06/18   Iloabachie, Chioma E, NP  HYDROcodone-acetaminophen (NORCO/VICODIN) 5-325 MG tablet Take 1 tablet by mouth every 6 (six) hours as needed for moderate pain. Patient not taking: Reported on 10/16/2018 09/13/18   Tommi Rumps, PA-C  oxyCODONE-acetaminophen  (PERCOCET) 5-325 MG tablet Take 1 tablet by mouth every 4 (four) hours as needed for severe pain. Patient not taking: Reported on 11/06/2018 10/24/18   Nita Sickle, MD  phenazopyridine (PYRIDIUM) 200 MG tablet Take 1 tablet (200 mg total) by mouth 3 (three) times daily as needed for pain. Patient not taking: Reported on 11/06/2018 10/20/18 10/20/19  Emily Filbert, MD  predniSONE (DELTASONE) 10 MG tablet Take 6 tablets  today, on day 2 take 5 tablets, day 3 take 4 tablets, day 4 take 3 tablets, day 5 take  2 tablets and 1 tablet the last day Patient not taking: Reported on 10/16/2018 09/13/18   Tommi Rumps, PA-C  predniSONE (DELTASONE) 5 MG tablet Take 8 tablets (40 mg total) by mouth daily with breakfast. 40 mg day 1, 30 mg day 2, 20 mg day 3, 10 mg day 1 Patient not taking: Reported on 11/06/2018 10/16/18   Iloabachie, Chioma E, NP  traMADol (ULTRAM) 50 MG tablet Take 1 tablet (50 mg total) by mouth every 6 (six) hours as needed. Patient not taking: Reported on 11/06/2018 10/20/18 10/20/19  Emily Filbert, MD  warfarin (COUMADIN) 5 MG tablet Take 2 tablets (10 mg total) by mouth one time only at 6 PM for 1 dose. 11/13/18 11/14/18  Iloabachie, Chioma E, NP    No Known Allergies  No family history on file.  Social History Social History   Tobacco Use  .  Smoking status: Current Every Day Smoker    Packs/day: 0.50    Types: Cigarettes  . Smokeless tobacco: Never Used  Substance Use Topics  . Alcohol use: Yes    Comment: occassional  . Drug use: Never    Review of Systems Constitutional: Negative for fever. Cardiovascular: Negative for chest pain. Respiratory: Negative for shortness of breath. Gastrointestinal: Negative for abdominal pain, vomiting Musculoskeletal: Left leg pain and swelling Skin: Negative for skin complaints  Neurological: Negative for headache All other ROS negative  ____________________________________________   PHYSICAL EXAM:  VITAL SIGNS: ED  Triage Vitals  Enc Vitals Group     BP 11/19/18 1625 127/88     Pulse Rate 11/19/18 1625 (!) 55     Resp 11/19/18 1625 20     Temp 11/19/18 1625 99.2 F (37.3 C)     Temp Source 11/19/18 1625 Oral     SpO2 11/19/18 1625 95 %     Weight 11/19/18 1624 (!) 301 lb (136.5 kg)     Height 11/19/18 1624 5\' 7"  (1.702 m)     Head Circumference --      Peak Flow --      Pain Score 11/19/18 1623 10     Pain Loc --      Pain Edu? --      Excl. in GC? --    Constitutional: Alert and oriented. Well appearing and in no distress. Eyes: Normal exam ENT   Head: Normocephalic and atraumatic.   Mouth/Throat: Mucous membranes are moist. Cardiovascular: Normal rate, regular rhythm.  Respiratory: Normal respiratory effort without tachypnea nor retractions. Breath sounds are clear  Gastrointestinal: Soft and nontender. No distention.   Musculoskeletal: Patient has swelling bilateral lower extremities left greater than right, with tenderness especially of the medial and posterior aspect of the left thigh.  Warm to touch. Neurologic:  Normal speech and language. No gross focal neurologic deficits  Skin:  Skin is warm, dry and intact.  Psychiatric: Mood and affect are normal.  ____________________________________________   RADIOLOGY  CT negative for PE. Ultrasound positive for acute DVT  ____________________________________________   INITIAL IMPRESSION / ASSESSMENT AND PLAN / ED COURSE  Pertinent labs & imaging results that were available during my care of the patient were reviewed by me and considered in my medical decision making (see chart for details).  Patient presents to the emergency department for left leg pain.  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  Differential at this time would include musculoskeletal pain, DVT, superficial thrombophlebitis.  We will check labs including INR, ultrasound and treat pain while awaiting ultrasound and lab results.  Patient agreeable to plan  of care.   Ultrasound positive for acute DVT.  We will CT scan the patient's chest and is currently satting between 9092%.  CT scan of chest is negative.  I discussed patient with vascular surgery Dr. Wyn Quaker, who states the patient will be a candidate for direct thrombolysis.  We will admit to the hospital service.  Patient agreeable to plan of care. ____________________________________________   FINAL CLINICAL IMPRESSION(S) / ED DIAGNOSES  Left leg pain DVT   Minna Antis, MD 11/19/18 2050

## 2018-11-19 NOTE — ED Triage Notes (Signed)
Pt has left upper leg pain.  Pt dx with blood clot in left lower leg 3 weeks ago.  Pt has increased pain.  Pt unable to move leg   Pt alert speech clear.

## 2018-11-19 NOTE — H&P (Signed)
[facility] [place] [facility]   PATIENT NAME: Matthew [name]    MR#:  [id number]  DATE OF BIRTH:  1970/12/15  DATE OF ADMISSION:  11/19/2018  PRIMARY CARE PHYSICIAN: Patient, No Pcp Per   REQUESTING/REFERRING PHYSICIAN: Paduchowski, MD  CHIEF COMPLAINT:   Chief Complaint  Patient presents with  . Leg Pain    HISTORY OF PRESENT ILLNESS:  Matthew [name]  is a 48 y.o. male who presents with chief complaint as above.  Patient presents to the ED with a complaint of left leg redness, pain, swelling.  He has a history of previous DVTs in this leg as well as a history of prior PE.  He states that he was diagnosed with another DVT in the lower part of that leg a couple of weeks ago.  He has been on Coumadin, and his level is therapeutic tonight.  However, ultrasound of the right leg shows more extensive DVT with some thrombophlebitis.  Vascular surgery contacted by ED physician with tentative plan for intervention.  Hospitalist called for admission  PAST MEDICAL HISTORY:   Past Medical History:  Diagnosis Date  . Hx of blood clots   . Sleep apnea      PAST SURGICAL HISTORY:   Past Surgical History:  Procedure Laterality Date  . NO PAST SURGERIES       SOCIAL HISTORY:   Social History   Tobacco Use  . Smoking status: Current Every Day Smoker    Packs/day: 0.50    Types: Cigarettes  . Smokeless tobacco: Never Used  Substance Use Topics  . Alcohol use: Yes    Comment: occassional     FAMILY HISTORY:    Family history reviewed and is non-contributory DRUG ALLERGIES:  No Known Allergies  MEDICATIONS AT HOME:   Prior to Admission medications   Medication Sig Start Date End Date Taking? Authorizing Provider  albuterol (ACCUNEB) 1.25 MG/3ML nebulizer solution Take 1 ampule by nebulization every 6 (six) hours as needed for wheezing.    [provider]  benzonatate (TESSALON PERLES) 100 MG capsule Take 1 capsule (100 mg total)  by mouth 2 (two) times daily. Patient not taking: Reported on 11/06/2018 10/16/18   Iloabachie, Chioma E, NP  enoxaparin (LOVENOX) 40 MG/0.4ML injection Inject 1.3 mLs (130 mg total) into the skin 2 (two) times daily for 7 days. 10/24/18 10/31/18  Nita Sickle, MD  furosemide (LASIX) 20 MG tablet Take 1 tablet (20 mg total) by mouth daily. 11/06/18   Iloabachie, Chioma E, NP  HYDROcodone-acetaminophen (NORCO/VICODIN) 5-325 MG tablet Take 1 tablet by mouth every 6 (six) hours as needed for moderate pain. Patient not taking: Reported on 10/16/2018 09/13/18   Tommi Rumps, PA-C  oxyCODONE-acetaminophen (PERCOCET) 5-325 MG tablet Take 1 tablet by mouth every 4 (four) hours as needed for severe pain. Patient not taking: Reported on 11/06/2018 10/24/18   Nita Sickle, MD  phenazopyridine (PYRIDIUM) 200 MG tablet Take 1 tablet (200 mg total) by mouth 3 (three) times daily as needed for pain. Patient not taking: Reported on 11/06/2018 10/20/18 10/20/19  Emily Filbert, MD  predniSONE (DELTASONE) 10 MG tablet Take 6 tablets  today, on day 2 take 5 tablets, day 3 take 4 tablets, day 4 take 3 tablets, day 5 take  2 tablets and 1 tablet the last day Patient not taking: Reported on 10/16/2018 09/13/18   Tommi Rumps, PA-C  predniSONE (DELTASONE) 5 MG tablet Take 8 tablets (40 mg total) by mouth  daily with breakfast. 40 mg day 1, 30 mg day 2, 20 mg day 3, 10 mg day 1 Patient not taking: Reported on 11/06/2018 10/16/18   Iloabachie, Chioma E, NP  traMADol (ULTRAM) 50 MG tablet Take 1 tablet (50 mg total) by mouth every 6 (six) hours as needed. Patient not taking: Reported on 11/06/2018 10/20/18 10/20/19  Emily Filbert, MD  warfarin (COUMADIN) 5 MG tablet Take 2 tablets (10 mg total) by mouth one time only at 6 PM for 1 dose. 11/13/18 11/14/18  Iloabachie, Chioma E, NP    REVIEW OF SYSTEMS:  Review of Systems  Constitutional: Negative for chills, fever, malaise/fatigue and weight loss.  HENT: Negative  for ear pain, hearing loss and tinnitus.   Eyes: Negative for blurred vision, double vision, pain and redness.  Respiratory: Negative for cough, hemoptysis and shortness of breath.   Cardiovascular: Negative for chest pain, palpitations, orthopnea and leg swelling.  Gastrointestinal: Negative for abdominal pain, constipation, diarrhea, nausea and vomiting.  Genitourinary: Negative for dysuria, frequency and hematuria.  Musculoskeletal: Negative for back pain, joint pain and neck pain.       Left leg pain  Skin:       No acne, rash, or lesions  Neurological: Negative for dizziness, tremors, focal weakness and weakness.  Endo/Heme/Allergies: Negative for polydipsia. Does not bruise/bleed easily.  Psychiatric/Behavioral: Negative for depression. The patient is not nervous/anxious and does not have insomnia.      VITAL SIGNS:   Vitals:   11/19/18 1750 11/19/18 1815 11/19/18 1830 11/19/18 1900  BP:  111/81 110/66 105/77  Pulse: (!) 103 97 68   Resp:      Temp:      TempSrc:      SpO2: 91% (!) 89% 92%   Weight:      Height:       Wt Readings from Last 3 Encounters:  11/19/18 (!) 136.5 kg  11/06/18 (!) 136.6 kg  10/24/18 132 kg    PHYSICAL EXAMINATION:  Physical Exam  Vitals reviewed. Constitutional: He is oriented to person, place, and time. He appears well-developed and well-nourished. No distress.  HENT:  Head: Normocephalic and atraumatic.  Mouth/Throat: Oropharynx is clear and moist.  Eyes: Pupils are equal, round, and reactive to light. Conjunctivae and EOM are normal. No scleral icterus.  Neck: Normal range of motion. Neck supple. No JVD present. No thyromegaly present.  Cardiovascular: Normal rate, regular rhythm and intact distal pulses. Exam reveals no gallop and no friction rub.  No murmur heard. Respiratory: Effort normal and breath sounds normal. No respiratory distress. He has no wheezes. He has no rales.  GI: Soft. Bowel sounds are normal. He exhibits no  distension. There is no abdominal tenderness.  Musculoskeletal: Normal range of motion.        General: Tenderness (Left leg) and edema present.     Comments: No arthritis, no gout  Lymphadenopathy:    He has no cervical adenopathy.  Neurological: He is alert and oriented to person, place, and time. No cranial nerve deficit.  No dysarthria, no aphasia  Skin: Skin is warm and dry. No rash noted. No erythema.  Psychiatric: He has a normal mood and affect. His behavior is normal. Judgment and thought content normal.    LABORATORY PANEL:   CBC Recent Labs  Lab 11/19/18 1651  WBC 11.9*  HGB 18.9*  HCT 57.4*  PLT 203   ------------------------------------------------------------------------------------------------------------------  Chemistries  Recent Labs  Lab 11/19/18 1651  NA 139  K 4.6  CL 101  CO2 30  GLUCOSE 120*  BUN 15  CREATININE 0.77  CALCIUM 8.7*   ------------------------------------------------------------------------------------------------------------------  Cardiac Enzymes No results for input(s): TROPONINI in the last 168 hours. ------------------------------------------------------------------------------------------------------------------  RADIOLOGY:  Ct Angio Chest Pe W And/or Wo Contrast  Result Date: 11/19/2018 CLINICAL DATA:  History of DVT hypoxia EXAM: CT ANGIOGRAPHY CHEST WITH CONTRAST TECHNIQUE: Multidetector CT imaging of the chest was performed using the standard protocol during bolus administration of intravenous contrast. Multiplanar CT image reconstructions and MIPs were obtained to evaluate the vascular anatomy. CONTRAST:  ISOVUE-370 IOPAMIDOL (ISOVUE-370) INJECTION 76% COMPARISON:  CT abdomen 10/20/2018, ultrasound 11/19/2018, CT chest 08/28/2018 FINDINGS: Cardiovascular: Satisfactory opacification of the pulmonary arteries to the segmental level. No acute filling defects are visualized to suggest acute embolus. Crescent shaped  hypodensity in the descending pulmonary artery on the right with small web like defects at proximal right lower lobe pulmonary arterial branch vessels, no change. Small web like filling defects within left lower lobe segmental branches, consistent with chronic PE, these findings are unchanged. Aorta is nonaneurysmal. Dilated pulmonary trunk measuring up to 5 cm. Mediastinum/Nodes: No enlarged mediastinal, hilar, or axillary lymph nodes. Thyroid within normal limits. Midline trachea. Right paratracheal lymph node measuring 13 mm, also no change. Esophagus within normal limits. Lungs/Pleura: Focal scarring at the right middle lobe. No acute consolidation or effusion. No pneumothorax Upper Abdomen: Gallstone Musculoskeletal: No chest wall abnormality. No acute or significant osseous findings. Degenerative changes. Review of the MIP images confirms the above findings. IMPRESSION: 1. No definite CT evidence for acute pulmonary embolus. 2. Eccentric wall thickening of the descending right pulmonary artery with small web like defects in segmental right and left lower lobe pulmonary vessels consistent with sequela of chronic PE, these findings are unchanged as compared with prior CT from December 2019. Pulmonary trunk is enlarged, consistent with pulmonary hypertension. 3. No acute pulmonary infiltrate 4. Gallstone Electronically Signed   By: Jasmine Pang M.D.   On: 11/19/2018 19:39   US Venous Img Lower Unilateral Left  Result Date: 11/19/2018 CLINICAL DATA:  LEFT lower extremity pain for 3 days EXAM: LEFT LOWER EXTREMITY VENOUS DOPPLER ULTRASOUND TECHNIQUE: Gray-scale sonography with graded compression, as well as color Doppler and duplex ultrasound were performed to evaluate the lower extremity deep venous systems from the level of the common femoral vein and including the common femoral, femoral, profunda femoral, popliteal and calf veins including the posterior tibial, peroneal and gastrocnemius veins when  visible. The superficial great saphenous vein was also interrogated. Spectral Doppler was utilized to evaluate flow at rest and with distal augmentation maneuvers in the common femoral, femoral and popliteal veins. COMPARISON:  None FINDINGS: Contralateral Common Femoral Vein: Respiratory phasicity is normal and symmetric with the symptomatic side. No evidence of thrombus. Normal compressibility. Common Femoral Vein: No evidence of thrombus. Normal compressibility, respiratory phasicity and response to augmentation. Saphenofemoral Junction: No evidence of thrombus. Normal compressibility and flow on color Doppler imaging. Profunda Femoral Vein: No evidence of thrombus. Normal compressibility and flow on color Doppler imaging. Femoral Vein: Scattered nonocclusive hypoechoic thrombus throughout LEFT femoral vein. Impaired compressibility and diminished spontaneous venous flow. Popliteal Vein: Nonocclusive hypoechoic thrombus within LEFT popliteal vein. Impaired compressibility. Diminished spontaneous venous flow. Calf Veins: Not adequately visualized due to body habitus. Superficial Great Saphenous Vein: Distended with hypoechoic thrombus from the knee to the ankle. Impaired compressibility and absent spontaneous venous flow. Venous Reflux:  None. Other Findings:  None. IMPRESSION: Acute appearing  deep venous thrombosis involving the LEFT femoral and popliteal veins. Superficial thrombophlebitis involving greater saphenous vein at the LEFT calf to LEFT knee. Electronically Signed   By: Ulyses Southward M.D.   On: 11/19/2018 18:07    EKG:  No orders found for this or any previous visit.  IMPRESSION AND PLAN:  Principal Problem:   DVT of lower extremity (deep venous thrombosis) (HCC) -IV heparin tonight, vascular surgery consult for likely intervention.  Unclear why this patient keeps having recurrent clots.  The first clot in 2012 was perhaps provoked given along truck ride.  His clots subsequently have been  unprovoked seemingly.  He states he has never had hematologic testing done.  I have ordered a series of blood test to check for underlying predisposing conditions  Chart review performed and case discussed with ED provider. Labs, imaging and/or ECG reviewed by provider and discussed with patient/family. Management plans discussed with the patient and/or family.  DVT PROPHYLAXIS: Systemic anticoagulation  GI PROPHYLAXIS:  None  ADMISSION STATUS: Inpatient     CODE STATUS: Full  TOTAL TIME TAKING CARE OF THIS PATIENT: 45 minutes.   Barney Drain 11/19/2018, 8:58 PM  [facility]  Office  [phone]  CC: Primary care physician; Patient, No Pcp Per  Note:  This document was prepared using Dragon voice recognition software and may include unintentional dictation errors.

## 2018-11-19 NOTE — ED Notes (Signed)
ED TO INPATIENT HANDOFF REPORT  ED Nurse Name and Phone #: [phone]  S Name/Age/Gender Matthew [name] 48 y.o. male Room/Bed: ED26A/ED26A  Code Status   Code Status: Not on file  Home/SNF/Other Home Patient oriented to: self, place, time and situation Is this baseline? Yes   Triage Complete: Triage complete  Chief Complaint Lt leg pain  Triage Note Pt has left upper leg pain.  Pt dx with blood clot in left lower leg 3 weeks ago.  Pt has increased pain.  Pt unable to move leg   Pt alert speech clear.     Allergies No Known Allergies  Level of Care/Admitting Diagnosis ED Disposition    ED Disposition Condition Comment   Admit  Hospital Area: [facility] [[id number]]  Level of Care: Med-Surg [16]  Diagnosis: DVT of lower extremity (deep venous thrombosis) [facility]) [[id number]]  Admitting Physician: Oralia Manis [[id number]]  Attending Physician: Oralia Manis [phone]  Estimated length of stay: past midnight tomorrow  Certification:: I certify this patient will need inpatient services for at least 2 midnights  PT Class (Do Not Modify): Inpatient [101]  PT Acc Code (Do Not Modify): Private [1]       B Medical/Surgery History Past Medical History:  Diagnosis Date  . Hx of blood clots   . Sleep apnea    Past Surgical History:  Procedure Laterality Date  . NO PAST SURGERIES       A IV Location/Drains/Wounds Patient Lines/Drains/Airways Status   Active Line/Drains/Airways    Name:   Placement date:   Placement time:   Site:   Days:   Peripheral IV 11/19/18 Right Hand   11/19/18    2143    Hand   less than 1   Peripheral IV 11/19/18 Right;Upper Arm   11/19/18    2204    Arm   less than 1          Intake/Output Last 24 hours No intake or output data in the 24 hours ending 11/19/18 2216  Labs/Imaging Results for orders placed or performed during the hospital encounter of 11/19/18 (from the past 48 hour(s))  CBC     Status: Abnormal   Collection  Time: 11/19/18  4:51 PM  Result Value Ref Range   WBC 11.9 (H) 4.0 - 10.5 K/uL   RBC 5.96 (H) 4.22 - 5.81 MIL/uL   Hemoglobin 18.9 (H) 13.0 - 17.0 g/dL   HCT 22.6 (H) 33.3 - 54.5 %   MCV 96.3 80.0 - 100.0 fL   MCH 31.7 26.0 - 34.0 pg   MCHC 32.9 30.0 - 36.0 g/dL   RDW 62.5 63.8 - 93.7 %   Platelets 203 150 - 400 K/uL   nRBC 0.0 0.0 - 0.2 %    Comment: Performed at [facility], [address] [place], [place] [address]  Basic metabolic panel     Status: Abnormal   Collection Time: 11/19/18  4:51 PM  Result Value Ref Range   Sodium 139 135 - 145 mmol/L   Potassium 4.6 3.5 - 5.1 mmol/L   Chloride 101 98 - 111 mmol/L   CO2 30 22 - 32 mmol/L   Glucose, Bld 120 (H) 70 - 99 mg/dL   BUN 15 6 - 20 mg/dL   Creatinine, Ser 6.81 0.61 - 1.24 mg/dL   Calcium 8.7 (L) 8.9 - 10.3 mg/dL   GFR calc non Af Amer >60 >60 mL/min   GFR calc Af Amer >60 >60 mL/min  Anion gap 8 5 - 15    Comment: Performed at [facility], [address] [place], [place] [address]  Protime-INR     Status: Abnormal   Collection Time: 11/19/18  4:51 PM  Result Value Ref Range   Prothrombin Time 29.6 (H) 11.4 - 15.2 seconds   INR 2.9 (H) 0.8 - 1.2    Comment: (NOTE) INR goal varies based on device and disease states. Performed at [facility], [address] [place], [place] [address]   APTT     Status: Abnormal   Collection Time: 11/19/18  9:42 PM  Result Value Ref Range   aPTT 41 (H) 24 - 36 seconds    Comment:        IF BASELINE aPTT IS ELEVATED, SUGGEST PATIENT RISK ASSESSMENT BE USED TO DETERMINE APPROPRIATE ANTICOAGULANT THERAPY. Performed at [facility], [address] [place], [place] [address]    Ct Angio Chest Pe W And/or Wo Contrast  Result Date: 11/19/2018 CLINICAL DATA:  History of DVT hypoxia EXAM: CT ANGIOGRAPHY CHEST WITH CONTRAST TECHNIQUE: Multidetector CT imaging of the chest was performed using the standard protocol during bolus administration  of intravenous contrast. Multiplanar CT image reconstructions and MIPs were obtained to evaluate the vascular anatomy. CONTRAST:  ISOVUE-370 IOPAMIDOL (ISOVUE-370) INJECTION 76% COMPARISON:  CT abdomen 10/20/2018, ultrasound 11/19/2018, CT chest 08/28/2018 FINDINGS: Cardiovascular: Satisfactory opacification of the pulmonary arteries to the segmental level. No acute filling defects are visualized to suggest acute embolus. Crescent shaped hypodensity in the descending pulmonary artery on the right with small web like defects at proximal right lower lobe pulmonary arterial branch vessels, no change. Small web like filling defects within left lower lobe segmental branches, consistent with chronic PE, these findings are unchanged. Aorta is nonaneurysmal. Dilated pulmonary trunk measuring up to 5 cm. Mediastinum/Nodes: No enlarged mediastinal, hilar, or axillary lymph nodes. Thyroid within normal limits. Midline trachea. Right paratracheal lymph node measuring 13 mm, also no change. Esophagus within normal limits. Lungs/Pleura: Focal scarring at the right middle lobe. No acute consolidation or effusion. No pneumothorax Upper Abdomen: Gallstone Musculoskeletal: No chest wall abnormality. No acute or significant osseous findings. Degenerative changes. Review of the MIP images confirms the above findings. IMPRESSION: 1. No definite CT evidence for acute pulmonary embolus. 2. Eccentric wall thickening of the descending right pulmonary artery with small web like defects in segmental right and left lower lobe pulmonary vessels consistent with sequela of chronic PE, these findings are unchanged as compared with prior CT from December 2019. Pulmonary trunk is enlarged, consistent with pulmonary hypertension. 3. No acute pulmonary infiltrate 4. Gallstone Electronically Signed   By: Jasmine Pang M.D.   On: 11/19/2018 19:39   US Venous Img Lower Unilateral Left  Result Date: 11/19/2018 CLINICAL DATA:  LEFT lower  extremity pain for 3 days EXAM: LEFT LOWER EXTREMITY VENOUS DOPPLER ULTRASOUND TECHNIQUE: Gray-scale sonography with graded compression, as well as color Doppler and duplex ultrasound were performed to evaluate the lower extremity deep venous systems from the level of the common femoral vein and including the common femoral, femoral, profunda femoral, popliteal and calf veins including the posterior tibial, peroneal and gastrocnemius veins when visible. The superficial great saphenous vein was also interrogated. Spectral Doppler was utilized to evaluate flow at rest and with distal augmentation maneuvers in the common femoral, femoral and popliteal veins. COMPARISON:  None FINDINGS: Contralateral Common Femoral Vein: Respiratory phasicity is normal and symmetric with the symptomatic side. No evidence of  thrombus. Normal compressibility. Common Femoral Vein: No evidence of thrombus. Normal compressibility, respiratory phasicity and response to augmentation. Saphenofemoral Junction: No evidence of thrombus. Normal compressibility and flow on color Doppler imaging. Profunda Femoral Vein: No evidence of thrombus. Normal compressibility and flow on color Doppler imaging. Femoral Vein: Scattered nonocclusive hypoechoic thrombus throughout LEFT femoral vein. Impaired compressibility and diminished spontaneous venous flow. Popliteal Vein: Nonocclusive hypoechoic thrombus within LEFT popliteal vein. Impaired compressibility. Diminished spontaneous venous flow. Calf Veins: Not adequately visualized due to body habitus. Superficial Great Saphenous Vein: Distended with hypoechoic thrombus from the knee to the ankle. Impaired compressibility and absent spontaneous venous flow. Venous Reflux:  None. Other Findings:  None. IMPRESSION: Acute appearing deep venous thrombosis involving the LEFT femoral and popliteal veins. Superficial thrombophlebitis involving greater saphenous vein at the LEFT calf to LEFT knee. Electronically  Signed   By: Ulyses SouthwardMark  Boles M.D.   On: 11/19/2018 18:07    Pending Labs Unresulted Labs (From admission, onward)    Start     Ordered   11/20/18 0500  CBC  Tomorrow morning,   STAT     11/19/18 2111   11/19/18 2150  Antiphospholipid syndrome eval, bld  Add-on,   AD     11/19/18 2149   11/19/18 2150  PROTEIN C AG + PROTEIN S AG  Add-on,   AD     11/19/18 2149   11/19/18 2150  Antithrombin III antigen  Add-on,   AD     11/19/18 2149   11/19/18 2150  Factor 5 leiden  Add-on,   AD     11/19/18 2149   11/19/18 2150  Prothrombin gene mutation  Add-on,   AD     11/19/18 2149   Signed and Held  HIV antibody (Routine Testing)  Once,   R     Signed and Held          Vitals/Pain Today's Vitals   11/19/18 1750 11/19/18 1815 11/19/18 1830 11/19/18 1900  BP:  111/81 110/66 105/77  Pulse: (!) 103 97 68   Resp:      Temp:      TempSrc:      SpO2: 91% (!) 89% 92%   Weight:      Height:      PainSc:        Isolation Precautions No active isolations  Medications Medications  morphine 4 MG/ML injection 4 mg (4 mg Intravenous Given 11/19/18 2102)  heparin ADULT infusion 100 units/mL (25000 units/26550mL sodium chloride 0.45%) (1,600 Units/hr Intravenous New Bag/Given 11/19/18 2216)  tamsulosin (FLOMAX) capsule 0.4 mg (has no administration in time range)  HYDROmorphone (DILAUDID) injection 1 mg (1 mg Intramuscular Given 11/19/18 1653)  iopamidol (ISOVUE-370) 76 % injection 100 mL (100 mLs Intravenous Contrast Given 11/19/18 1928)  heparin bolus via infusion 5,000 Units (5,000 Units Intravenous Bolus from Bag 11/19/18 2214)    Mobility walks Low fall risk   Focused Assessments Pulmonary Assessment Handoff:  Lung sounds:   O2 Device: Room Air        R Recommendations: See Admitting Provider Note  Report given to:   Additional Notes:

## 2018-11-19 NOTE — ED Notes (Signed)
Patient returned from Ultrasound. 

## 2018-11-20 ENCOUNTER — Ambulatory Visit: Payer: Self-pay | Admitting: Gerontology

## 2018-11-20 ENCOUNTER — Institutional Professional Consult (permissible substitution): Payer: Self-pay | Admitting: Licensed Clinical Social Worker

## 2018-11-20 DIAGNOSIS — F17210 Nicotine dependence, cigarettes, uncomplicated: Secondary | ICD-10-CM

## 2018-11-20 DIAGNOSIS — I82402 Acute embolism and thrombosis of unspecified deep veins of left lower extremity: Secondary | ICD-10-CM

## 2018-11-20 LAB — CBC
HCT: 53 % — ABNORMAL HIGH (ref 39.0–52.0)
Hemoglobin: 17 g/dL (ref 13.0–17.0)
MCH: 31.4 pg (ref 26.0–34.0)
MCHC: 32.1 g/dL (ref 30.0–36.0)
MCV: 97.8 fL (ref 80.0–100.0)
Platelets: 185 10*3/uL (ref 150–400)
RBC: 5.42 MIL/uL (ref 4.22–5.81)
RDW: 14.7 % (ref 11.5–15.5)
WBC: 11 10*3/uL — ABNORMAL HIGH (ref 4.0–10.5)
nRBC: 0 % (ref 0.0–0.2)

## 2018-11-20 LAB — PROTIME-INR
INR: 2.5 — ABNORMAL HIGH (ref 0.8–1.2)
Prothrombin Time: 26.6 seconds — ABNORMAL HIGH (ref 11.4–15.2)

## 2018-11-20 LAB — HEPARIN LEVEL (UNFRACTIONATED): Heparin Unfractionated: 0.26 IU/mL — ABNORMAL LOW (ref 0.30–0.70)

## 2018-11-20 MED ORDER — APIXABAN 5 MG PO TABS: 5.0000 mg | ORAL_TABLET | Freq: Two times a day (BID) | ORAL | 0 refills | Status: DC

## 2018-11-20 MED ORDER — RIVAROXABAN 20 MG PO TABS: 20.0000 mg | ORAL_TABLET | Freq: Every day | ORAL | 0 refills | Status: DC

## 2018-11-20 MED ORDER — RIVAROXABAN 15 MG PO TABS
15.0000 mg | ORAL_TABLET | Freq: Two times a day (BID) | ORAL | Status: DC
  Filled 2018-11-20: qty 1

## 2018-11-20 MED ORDER — RIVAROXABAN 20 MG PO TABS: 20.0000 mg | ORAL_TABLET | Freq: Every day | ORAL | Status: DC

## 2018-11-20 MED ORDER — OXYCODONE HCL 5 MG PO TABS: 5.0000 mg | ORAL_TABLET | ORAL | 0 refills | Status: DC | PRN

## 2018-11-20 MED ORDER — RIVAROXABAN 15 MG PO TABS: 15.0000 mg | ORAL_TABLET | Freq: Two times a day (BID) | ORAL | 0 refills | Status: DC

## 2018-11-20 MED ORDER — HEPARIN BOLUS VIA INFUSION
1500.0000 [IU] | Freq: Once | INTRAVENOUS | Status: AC
  Administered 2018-11-20: 1500 [IU] via INTRAVENOUS
  Filled 2018-11-20: qty 1500

## 2018-11-20 NOTE — TOC Initial Note (Signed)
Transition of Care [facility]) - Initial/Assessment Note    Patient Details  Name: Matthew [name] MRN: [id number] Date of Birth: 1971/07/28  [facility]) CM/SW Contact:    York Spaniel, LCSW Phone Number: 11/20/2018, 12:04 PM  Clinical Narrative:                   Expected Discharge Plan: Home/Self Care Barriers to Discharge: No Barriers Identified   Patient Goals and CMS Choice        Expected Discharge Plan and Services Expected Discharge Plan: Home/Self Care   Discharge Planning Services: Indigent Health Clinic, Medication Assistance     Expected Discharge Date: 11/20/18                        Prior Living Arrangements/Services   Lives with:: Self Patient language and need for interpreter reviewed:: Yes Do you feel safe going back to the place where you live?: Yes      Need for Family Participation in Patient Care: No (Comment) Care giver support system in place?: Yes (comment)   Criminal Activity/Legal Involvement Pertinent to Current Situation/Hospitalization: No - Comment as needed  Activities of Daily Living Home Assistive Devices/Equipment: None ADL Screening (condition at time of admission) Patient's cognitive ability adequate to safely complete daily activities?: Yes Is the patient deaf or have difficulty hearing?: No Does the patient have difficulty seeing, even when wearing glasses/contacts?: No Does the patient have difficulty concentrating, remembering, or making decisions?: No Patient able to express need for assistance with ADLs?: Yes Does the patient have difficulty dressing or bathing?: No Independently performs ADLs?: Yes (appropriate for developmental age) Does the patient have difficulty walking or climbing stairs?: No Weakness of Legs: Left Weakness of Arms/Hands: None  Permission Sought/Granted                  Emotional Assessment Appearance:: Appears stated age Attitude/Demeanor/Rapport: Hostile Affect (typically  observed): Calm, Frustrated Orientation: : Oriented to Self, Oriented to Place, Oriented to  Time, Oriented to Situation Alcohol / Substance Use: Not Applicable Psych Involvement: No (comment)  Admission diagnosis:  Left leg pain [M79.605] Acute deep vein thrombosis (DVT) of femoral vein of left lower extremity (HCC) [I82.412] Patient Active Problem List   Diagnosis Date Noted  . DVT of lower extremity (deep venous thrombosis) (HCC) 11/19/2018   PCP:  Patient, No Pcp Per Pharmacy:   [facility] - [place], [place] - [address] [place] [address] Phone: [phone] Fax: [phone]     Social Determinants of Health (SDOH) Interventions    Readmission Risk Interventions No flowsheet data found.

## 2018-11-20 NOTE — Discharge Instructions (Signed)
Vascular Surgery Discharge Instructions 1) Please report to the medical mall registration desk at 815am on Monday 11/24/18 for your procedure.  2) Please do not eat or drink anything after midnight the night before. You can take your pills with sip of water that morning.  3) You will need to have somebody with you as you will be receiving sedation and will not be able to drive home by yourself.  4) Continue taking your Xarelto as per your normal routine.

## 2018-11-20 NOTE — Progress Notes (Signed)
11/20/2018 12:29 PM  Matthew [name] to be D/C'd Home per MD order.  Discussed prescriptions and follow up appointments with the patient. Prescriptions given to patient, medication list explained in detail. Pt verbalized understanding.  Allergies as of 11/20/2018   No Known Allergies     Medication List    STOP taking these medications   benzonatate 100 MG capsule Commonly known as:  Tessalon Perles   enoxaparin 40 MG/0.4ML injection Commonly known as:  LOVENOX   HYDROcodone-acetaminophen 5-325 MG tablet Commonly known as:  NORCO/VICODIN   oxyCODONE-acetaminophen 5-325 MG tablet Commonly known as:  Percocet   phenazopyridine 200 MG tablet Commonly known as:  Pyridium   predniSONE 10 MG tablet Commonly known as:  DELTASONE   predniSONE 5 MG tablet Commonly known as:  DELTASONE   traMADol 50 MG tablet Commonly known as:  Ultram   warfarin 5 MG tablet Commonly known as:  COUMADIN     TAKE these medications   albuterol 1.25 MG/3ML nebulizer solution Commonly known as:  ACCUNEB Take 1 ampule by nebulization every 6 (six) hours as needed for wheezing.   furosemide 20 MG tablet Commonly known as:  LASIX Take 1 tablet (20 mg total) by mouth daily.   oxyCODONE 5 MG immediate release tablet Commonly known as:  Oxy IR/ROXICODONE Take 1 tablet (5 mg total) by mouth every 4 (four) hours as needed for moderate pain.   Rivaroxaban 15 MG Tabs tablet Commonly known as:  XARELTO Take 1 tablet (15 mg total) by mouth 2 (two) times daily with a meal for 20 days.   rivaroxaban 20 MG Tabs tablet Commonly known as:  XARELTO Take 1 tablet (20 mg total) by mouth daily with supper. Start taking on:  December 11, 2018       Vitals:   11/20/18 0442 11/20/18 1111  BP:  100/66  Pulse: 82 93  Resp:    Temp:  98.7 F (37.1 C)  SpO2: 93% (!) 89%    Skin clean, dry and intact without evidence of skin break down, no evidence of skin tears noted. IV catheter discontinued intact. Site  without signs and symptoms of complications. Dressing and pressure applied. Pt denies pain at this time. No complaints noted.  An After Visit Summary was printed and given to the patient. Patient escorted via WC, and D/C home via private auto.  Bradly Chris

## 2018-11-20 NOTE — TOC Transition Note (Signed)
Transition of Care [facility]) - CM/SW Discharge Note   Patient Details  Name: Matthew [name] MRN: [id number] Date of Birth: [birth year]  [facility] of [facility]) CM/SW Contact:  York Spaniel, LCSW Phone Number: 11/20/2018, 12:09 PM   Clinical Narrative:   Patient discharging today. Assistance with xarelto given through med management. [facility] notified of patient discharge and to contact patient for an appointment.     Final next level of care: Home/Self Care Barriers to Discharge: No Barriers Identified   Patient Goals and CMS Choice        Discharge Placement                       Discharge Plan and Services   Discharge Planning Services: Indigent Health Clinic, Medication Assistance                      Social Determinants of Health (SDOH) Interventions     Readmission Risk Interventions No flowsheet data found.

## 2018-11-20 NOTE — Consult Note (Addendum)
ANTICOAGULATION CONSULT NOTE - Initial Consult  Pharmacy Consult for XARELTO Indication: DVT  No Known Allergies  Patient Measurements: Height: 5\' 7"  (170.2 cm) Weight: (!) 301 lb (136.5 kg) IBW/kg (Calculated) : 66.1  Vital Signs: Temp: 98.7 F (37.1 C) (03/19 0431) Temp Source: Oral (03/19 0431) BP: 91/64 (03/19 0431) Pulse Rate: 82 (03/19 0442)  Labs: Recent Labs    11/19/18 1651 11/19/18 2142 11/20/18 0450  HGB 18.9*  --  17.0  HCT 57.4*  --  53.0*  PLT 203  --  185  APTT  --  41*  --   LABPROT 29.6*  --  26.6*  INR 2.9*  --  2.5*  HEPARINUNFRC  --   --  0.26*  CREATININE 0.77  --   --     Estimated Creatinine Clearance: 150.6 mL/min (by C-G formula based on SCr of 0.77 mg/dL).   Medical History: Past Medical History:  Diagnosis Date  . Hx of blood clots   . Sleep apnea     Medications:  Patient on heparin drip prior to procedure 3/19.    Heparin drip DC'ed by Dr Juliene Pina [phone] 11/20/18 Consult originally entered for Eliquis for DVT treatment (verbally change to Xarelto per Dr Juliene Pina)  Patient last dose of Warfarin 15mg  prior to admission on 3/17  Assessment: Patient is still in therapeutic range of INR 2-3 @ 2.5 this am @ 0450.      Plan:  Per labeling, when transitioning from Warfarin to Xarelto, may initiate first dose of Xarelto when INR < 3.  Will start Xarelto 15mg  bid with meals x 21 days, followed by Xarelto 20mg  qd with meals thereafter (patient likely to discharge after 1st dose)  Will monitor CBC and Scr at least every 3 days per protocol  Albina Billet, PharmD, BCPS Clinical Pharmacist 11/20/2018 10:27 AM

## 2018-11-20 NOTE — Consult Note (Signed)
[facility] Vascular Consult Note  MRN : [id number]  Clif Serio is a 48 y.o. ([birth year]) male who presents with chief complaint of  Chief Complaint  Patient presents with  . Leg Pain   History of Present Illness:  The patient is a 48 year old male with a past medical history of obesity, sleep apnea, daily tobacco abuse, history of DVT and PE who presented to the [facility] emergency department with a chief complaint of progressively worsening left lower extremity pain.  The patient endorses a history of being diagnosed with DVT and subsequent PE in 2018.  He states pain diagnosed with a left lower extremity DVT on October 24, 2018.  The patient has been on Coumadin since his first DVT.  The patient presented to the ED with therapeutic INR of 2.9.  The patient notes experiencing progressively worsening left lower extremity edema and discomfort which prompted him to seek medical attention in our emergency department.  The patient was found to have: 11/19/18 Venous Duplex:  1) Acute appearing deep venous thrombosis involving the LEFT femoral and popliteal veins. 2) Superficial thrombophlebitis involving greater saphenous vein at the LEFT calf to LEFT knee.  Patient denied any shortness of breath or chest pain.  11/19/18 CT angiogram of the chest: 1. No definite CT evidence for acute pulmonary embolus. 2. Eccentric wall thickening of the descending right pulmonary artery with small web like defects in segmental right and left lower lobe pulmonary vessels consistent with sequela of chronic PE, these findings are unchanged as compared with prior CT from December 2019. Pulmonary trunk is enlarged, consistent with pulmonary hypertension. 3. No acute pulmonary infiltrate 4. Gallstone  The patient notes that he has not followed up with a hematologist since his first diagnosis.  Vascular surgery was consulted by Dr. Anne Hahn for possible IVC filter  insertion and venous lysis. Current Facility-Administered Medications  Medication Dose Route Frequency Provider Last Rate Last Dose  . acetaminophen (TYLENOL) tablet 650 mg  650 mg Oral Q6H PRN Oralia Manis, MD       Or  . acetaminophen (TYLENOL) suppository 650 mg  650 mg Rectal Q6H PRN Oralia Manis, MD      . morphine 4 MG/ML injection 4 mg  4 mg Intravenous Q2H PRN Minna Antis, MD   4 mg at 11/20/18 0947  . ondansetron (ZOFRAN) tablet 4 mg  4 mg Oral Q6H PRN Oralia Manis, MD       Or  . ondansetron [facility]) injection 4 mg  4 mg Intravenous Q6H PRN Oralia Manis, MD      . oxyCODONE (Oxy IR/ROXICODONE) immediate release tablet 5 mg  5 mg Oral Q4H PRN Oralia Manis, MD   5 mg at 11/20/18 0500  . Rivaroxaban (XARELTO) tablet 15 mg  15 mg Oral BID WC Adrian Saran, MD       Followed by  . [START ON 12/11/2018] rivaroxaban (XARELTO) tablet 20 mg  20 mg Oral Q supper Adrian Saran, MD      . tamsulosin (FLOMAX) capsule 0.4 mg  0.4 mg Oral QPC supper Oralia Manis, MD   0.4 mg at 11/19/18 2306   Past Medical History:  Diagnosis Date  . Hx of blood clots   . Sleep apnea    Past Surgical History:  Procedure Laterality Date  . NO PAST SURGERIES     Social History Social History   Tobacco Use  . Smoking status: Current Every Day Smoker    Packs/day: 0.50  Types: Cigarettes  . Smokeless tobacco: Never Used  Substance Use Topics  . Alcohol use: Yes    Comment: occassional  . Drug use: Never   Family History No family history on file.  Denies family history of peripheral artery disease, venous disease and or bleeding/clotting disorders.  No Known Allergies  REVIEW OF SYSTEMS (Negative unless checked)  Constitutional: [] Weight loss  [] Fever  [] Chills Cardiac: [] Chest pain   [] Chest pressure   [] Palpitations   [] Shortness of breath when laying flat   [] Shortness of breath at rest   [] Shortness of breath with exertion. Vascular:  [x] Pain in legs with walking   [x] Pain in legs  at rest   [x] Pain in legs when laying flat   [] Claudication   [] Pain in feet when walking  [] Pain in feet at rest  [] Pain in feet when laying flat   [] History of DVT   [] Phlebitis   [x] Swelling in legs   [] Varicose veins   [] Non-healing ulcers Pulmonary:   [] Uses home oxygen   [] Productive cough   [] Hemoptysis   [] Wheeze  [] COPD   [] Asthma Neurologic:  [] Dizziness  [] Blackouts   [] Seizures   [] History of stroke   [] History of TIA  [] Aphasia   [] Temporary blindness   [] Dysphagia   [] Weakness or numbness in arms   [] Weakness or numbness in legs Musculoskeletal:  [] Arthritis   [] Joint swelling   [] Joint pain   [] Low back pain Hematologic:  [] Easy bruising  [] Easy bleeding   [x] Hypercoagulable state   [] Anemic  [] Hepatitis Gastrointestinal:  [] Blood in stool   [] Vomiting blood  [] Gastroesophageal reflux/heartburn   [] Difficulty swallowing. Genitourinary:  [] Chronic kidney disease   [] Difficult urination  [] Frequent urination  [] Burning with urination   [] Blood in urine Skin:  [] Rashes   [] Ulcers   [] Wounds Psychological:  [] History of anxiety   []  History of major depression.  Physical Examination  Vitals:   11/19/18 2244 11/20/18 0431 11/20/18 0442 11/20/18 1111  BP: 100/66 91/64  100/66  Pulse:  74 82 93  Resp: 20 20    Temp: 99.3 F (37.4 C) 98.7 F (37.1 C)  98.7 F (37.1 C)  TempSrc: Oral Oral  Oral  SpO2:  (!) 82% 93% (!) 89%  Weight:      Height:       Body mass index is 47.14 kg/m. Gen:  WD/WN, NAD Head: [place]/AT, No temporalis wasting. Prominent temp pulse not noted. Ear/Nose/Throat: Hearing grossly intact, nares w/o erythema or drainage, oropharynx w/o Erythema/Exudate Eyes: Sclera non-icteric, conjunctiva clear Neck: Trachea midline.  No JVD.  Pulmonary:  Good air movement, respirations not labored, equal bilaterally.  Cardiac: RRR, normal S1, S2. Vascular:  Vessel Right Left  Radial Palpable Palpable  Ulnar Palpable Palpable  Brachial Palpable Palpable  Carotid Palpable,  without bruit Palpable, without bruit  Aorta Not palpable N/A  Femoral Palpable Palpable  Popliteal Palpable Palpable  PT Palpable Palpable  DP Palpable Palpable   Left Lower Extremity: Thigh soft, calf soft.  Mildly tender to palpation.  Moderate nonpitting edema noted.  There is no acute vascular compromise to the extremity.  Gastrointestinal: soft, non-tender/non-distended. No guarding/reflex.  Musculoskeletal: M/S 5/5 throughout.  Extremities without ischemic changes.  No deformity or atrophy.  Neurologic: Sensation grossly intact in extremities.  Symmetrical.  Speech is fluent. Motor exam as listed above. Psychiatric: Judgment intact, Mood & affect appropriate for pt's clinical situation. Dermatologic: No rashes or ulcers noted.  No cellulitis or open wounds. Lymph : No Cervical, Axillary, or  Inguinal lymphadenopathy.  CBC Lab Results  Component Value Date   WBC 11.0 (H) 11/20/2018   HGB 17.0 11/20/2018   HCT 53.0 (H) 11/20/2018   MCV 97.8 11/20/2018   PLT 185 11/20/2018   BMET    Component Value Date/Time   NA 139 11/19/2018 1651   NA 143 09/10/2018 0930   K 4.6 11/19/2018 1651   CL 101 11/19/2018 1651   CO2 30 11/19/2018 1651   GLUCOSE 120 (H) 11/19/2018 1651   BUN 15 11/19/2018 1651   BUN 8 09/10/2018 0930   CREATININE 0.77 11/19/2018 1651   CALCIUM 8.7 (L) 11/19/2018 1651   GFRNONAA >60 11/19/2018 1651   GFRAA >60 11/19/2018 1651   Estimated Creatinine Clearance: 150.6 mL/min (by C-G formula based on SCr of 0.77 mg/dL).  COAG Lab Results  Component Value Date   INR 2.5 (H) 11/20/2018   INR 2.9 (H) 11/19/2018   INR 1.5 (A) 11/06/2018   Radiology Ct Angio Chest Pe W And/or Wo Contrast  Result Date: 11/19/2018 CLINICAL DATA:  History of DVT hypoxia EXAM: CT ANGIOGRAPHY CHEST WITH CONTRAST TECHNIQUE: Multidetector CT imaging of the chest was performed using the standard protocol during bolus administration of intravenous contrast. Multiplanar CT image  reconstructions and MIPs were obtained to evaluate the vascular anatomy. CONTRAST:  ISOVUE-370 IOPAMIDOL (ISOVUE-370) INJECTION 76% COMPARISON:  CT abdomen 10/20/2018, ultrasound 11/19/2018, CT chest 08/28/2018 FINDINGS: Cardiovascular: Satisfactory opacification of the pulmonary arteries to the segmental level. No acute filling defects are visualized to suggest acute embolus. Crescent shaped hypodensity in the descending pulmonary artery on the right with small web like defects at proximal right lower lobe pulmonary arterial branch vessels, no change. Small web like filling defects within left lower lobe segmental branches, consistent with chronic PE, these findings are unchanged. Aorta is nonaneurysmal. Dilated pulmonary trunk measuring up to 5 cm. Mediastinum/Nodes: No enlarged mediastinal, hilar, or axillary lymph nodes. Thyroid within normal limits. Midline trachea. Right paratracheal lymph node measuring 13 mm, also no change. Esophagus within normal limits. Lungs/Pleura: Focal scarring at the right middle lobe. No acute consolidation or effusion. No pneumothorax Upper Abdomen: Gallstone Musculoskeletal: No chest wall abnormality. No acute or significant osseous findings. Degenerative changes. Review of the MIP images confirms the above findings. IMPRESSION: 1. No definite CT evidence for acute pulmonary embolus. 2. Eccentric wall thickening of the descending right pulmonary artery with small web like defects in segmental right and left lower lobe pulmonary vessels consistent with sequela of chronic PE, these findings are unchanged as compared with prior CT from December 2019. Pulmonary trunk is enlarged, consistent with pulmonary hypertension. 3. No acute pulmonary infiltrate 4. Gallstone Electronically Signed   By: Jasmine Pang M.D.   On: 11/19/2018 19:39   US Venous Img Lower Unilateral Left  Result Date: 11/19/2018 CLINICAL DATA:  LEFT lower extremity pain for 3 days EXAM: LEFT LOWER EXTREMITY  VENOUS DOPPLER ULTRASOUND TECHNIQUE: Gray-scale sonography with graded compression, as well as color Doppler and duplex ultrasound were performed to evaluate the lower extremity deep venous systems from the level of the common femoral vein and including the common femoral, femoral, profunda femoral, popliteal and calf veins including the posterior tibial, peroneal and gastrocnemius veins when visible. The superficial great saphenous vein was also interrogated. Spectral Doppler was utilized to evaluate flow at rest and with distal augmentation maneuvers in the common femoral, femoral and popliteal veins. COMPARISON:  None FINDINGS: Contralateral Common Femoral Vein: Respiratory phasicity is normal and symmetric with the  symptomatic side. No evidence of thrombus. Normal compressibility. Common Femoral Vein: No evidence of thrombus. Normal compressibility, respiratory phasicity and response to augmentation. Saphenofemoral Junction: No evidence of thrombus. Normal compressibility and flow on color Doppler imaging. Profunda Femoral Vein: No evidence of thrombus. Normal compressibility and flow on color Doppler imaging. Femoral Vein: Scattered nonocclusive hypoechoic thrombus throughout LEFT femoral vein. Impaired compressibility and diminished spontaneous venous flow. Popliteal Vein: Nonocclusive hypoechoic thrombus within LEFT popliteal vein. Impaired compressibility. Diminished spontaneous venous flow. Calf Veins: Not adequately visualized due to body habitus. Superficial Great Saphenous Vein: Distended with hypoechoic thrombus from the knee to the ankle. Impaired compressibility and absent spontaneous venous flow. Venous Reflux:  None. Other Findings:  None. IMPRESSION: Acute appearing deep venous thrombosis involving the LEFT femoral and popliteal veins. Superficial thrombophlebitis involving greater saphenous vein at the LEFT calf to LEFT knee. Electronically Signed   By: Ulyses Southward M.D.   On: 11/19/2018 18:07    US Venous Img Lower Unilateral Left  Result Date: 10/24/2018 CLINICAL DATA:  Left lower extremity pain and edema. History of DVT and pulmonary embolism. Evaluate for acute or chronic DVT. EXAM: LEFT LOWER EXTREMITY VENOUS DOPPLER ULTRASOUND TECHNIQUE: Gray-scale sonography with graded compression, as well as color Doppler and duplex ultrasound were performed to evaluate the lower extremity deep venous systems from the level of the common femoral vein and including the common femoral, femoral, profunda femoral, popliteal and calf veins including the posterior tibial, peroneal and gastrocnemius veins when visible. The superficial great saphenous vein was also interrogated. Spectral Doppler was utilized to evaluate flow at rest and with distal augmentation maneuvers in the common femoral, femoral and popliteal veins. COMPARISON:  Left lower extremity venous Doppler ultrasound-08/28/2018 FINDINGS: Contralateral Common Femoral Vein: Respiratory phasicity is normal and symmetric with the symptomatic side. No evidence of thrombus. Normal compressibility. Common Femoral Vein: No evidence of thrombus. Normal compressibility, respiratory phasicity and response to augmentation. Saphenofemoral Junction: No evidence of thrombus. Normal compressibility and flow on color Doppler imaging. Profunda Femoral Vein: No evidence of thrombus. Normal compressibility and flow on color Doppler imaging. Femoral Vein: There is nonocclusive mixed echogenic thrombus seen with the proximal aspect of the left femoral vein (images 9 and 19), new compared to the 08/2018 examination. The mid and distal aspects of the femoral vein appear atretic though patent, unchanged. Popliteal Vein: Grossly unchanged mixed echogenic near occlusive DVT within the left popliteal vein (images 31 through 34). Calf Veins: Not well visualized Superficial Great Saphenous Vein: No evidence of thrombus. Normal compressibility. Other Findings:  None. IMPRESSION: 1.  Nonocclusive mixed echogenic thrombus within the proximal left femoral vein, apparently new compared to the 08/2018 examination though appears at least subacute. Correlation advised. 2. 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 Electronically Signed   By: Simonne Come M.D.   On: 10/24/2018 12:13   Assessment/Plan The patient is a 48 year old male with a past medical history of obesity, sleep apnea, daily tobacco abuse, history of DVT and PE who presented to the [facility] emergency department with a chief complaint of progressively worsening left lower extremity pain found to have an acute lower extremity DVT. 1.  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  Patient notes being compliant was taking Coumadin and his INR was therapeutic (2.9) when taken in the emergency department.  Patient denies any recent surgery or trauma or travel/prolonged immobilization.  At this time the patient has failed  anti-coagulation with Coumadin with the recent diagnosis of an acute left lower extremity DVT.  Recommend IVC filter placement and left lower extremity venous lysis.  This is planned for Monday 11/24/18 with Dr. Wyn Quakerew.  Procedure, risks and benefits explained to the patient.  All questions answered.  The patient wishes to proceed.  Would recommend follow-up with a hematologist in the future to rule out any genetic bleeding disorders.  Medicine will be transitioning the patient to Xarelto for better compliance.  Currently, the patient does not have health insurance and is concerned how he is going to afford his medical bills/medications.  I have consulted social work asking for their assistance in this matter. 2. Tobacco Abuse: We had a discussion for approximately three minutes regarding the absolute need for smoking cessation due to the deleterious nature of tobacco on the vascular system. We discussed the tobacco use  would diminish patency of any intervention, and likely significantly worsen progressio of disease. We discussed multiple agents for quitting including replacement therapy or medications to reduce cravings such as Chantix. The patient voices their understanding of the importance of smoking cessation. 3. PE: No acute PE found on CTA.  No pulmonary lysis indicated at this time.  Continue anticoagulation.  Insertion of the IVC filter will protect the patient from further PE.  Discussed with Dr. Weldon Inchesew  Keola Heninger A Clemmie Buelna, PA-C  11/20/2018 11:30 AM  This note was created with Dragon medical transcription system.  Any error is purely unintentional.

## 2018-11-20 NOTE — Discharge Summary (Signed)
[employer] [facility] [facility] [place] at [facility]   PATIENT NAME: Matthew [name]    MR#:  [id number]  DATE OF BIRTH:  1970/10/25  DATE OF ADMISSION:  11/19/2018 ADMITTING PHYSICIAN: Oralia Manis, MD  DATE OF DISCHARGE: 11/20/2018  PRIMARY CARE PHYSICIAN: Patient, No Pcp Per    ADMISSION DIAGNOSIS:  Left leg pain [M79.605] Acute deep vein thrombosis (DVT) of femoral vein of left lower extremity (HCC) [I82.412]  DISCHARGE DIAGNOSIS:  Principal Problem:   DVT of lower extremity (deep venous thrombosis) (HCC)   SECONDARY DIAGNOSIS:   Past Medical History:  Diagnosis Date  . Hx of blood clots   . Sleep apnea     HOSPITAL COURSE:   48 year old morbidly obese man with history of DVTs who presents with leg pain.  1.Acute appearing deep venous thrombosis involving the LEFT femoral and popliteal veins. Case was discussed with Dr. dew from vascular surgery.  Plan is to have outpatient thrombectomy on Monday.  Patient was on Coumadin due to financial reasons.  Case management is assisting with Xarelto via medication management program.  [facility] as [facility] as [facility]. Admitting MD ordered thrombotic panel which needs to be followed up as an outpatient.  2. Tobacco dependence: Patient is encouraged to quit smoking and willing to attempt to quit was assessed. Patient not at all highly motivated to stop smoking.Counseling was provided for 4 minutes.  3.  Morbid obesity: Weight loss as tolerated    DISCHARGE CONDITIONS AND DIET:   Stable Regular diet  CONSULTS OBTAINED:  Treatment Team:  Annice Needy, MD  DRUG ALLERGIES:  No Known Allergies  DISCHARGE MEDICATIONS:   Allergies as of 11/20/2018   No Known Allergies     Medication List    STOP taking these medications   benzonatate 100 MG capsule Commonly known as:  Tessalon Perles   enoxaparin 40 MG/0.4ML injection Commonly known as:  LOVENOX   HYDROcodone-acetaminophen  5-325 MG tablet Commonly known as:  NORCO/VICODIN   oxyCODONE-acetaminophen 5-325 MG tablet Commonly known as:  Percocet   phenazopyridine 200 MG tablet Commonly known as:  Pyridium   predniSONE 10 MG tablet Commonly known as:  DELTASONE   predniSONE 5 MG tablet Commonly known as:  DELTASONE   traMADol 50 MG tablet Commonly known as:  Ultram   warfarin 5 MG tablet Commonly known as:  COUMADIN     TAKE these medications   albuterol 1.25 MG/3ML nebulizer solution Commonly known as:  ACCUNEB Take 1 ampule by nebulization every 6 (six) hours as needed for wheezing.   furosemide 20 MG tablet Commonly known as:  LASIX Take 1 tablet (20 mg total) by mouth daily.   oxyCODONE 5 MG immediate release tablet Commonly known as:  Oxy IR/ROXICODONE Take 1 tablet (5 mg total) by mouth every 4 (four) hours as needed for moderate pain.   Rivaroxaban 15 MG Tabs tablet Commonly known as:  XARELTO Take 1 tablet (15 mg total) by mouth 2 (two) times daily with a meal for 20 days.   rivaroxaban 20 MG Tabs tablet Commonly known as:  XARELTO Take 1 tablet (20 mg total) by mouth daily with supper. Start taking on:  December 11, 2018         Today   CHIEF COMPLAINT:  Patient hungry    VITAL SIGNS:  Blood pressure 100/66, pulse 93, temperature 98.7 F (37.1 C), temperature source Oral, resp. rate 20, height  (1.702 m), weight (!) 136.5 kg,  SpO2 (!) 89 %.   REVIEW OF SYSTEMS:  Review of Systems  Constitutional: Negative.  Negative for chills, fever and malaise/fatigue.  HENT: Negative.  Negative for ear discharge, ear pain, hearing loss, nosebleeds and sore throat.   Eyes: Negative.  Negative for blurred vision and pain.  Respiratory: Negative.  Negative for cough, hemoptysis, shortness of breath and wheezing.   Cardiovascular: Negative.  Negative for chest pain, palpitations and leg swelling.  Gastrointestinal: Negative.  Negative for abdominal pain, blood in stool, diarrhea,  nausea and vomiting.  Genitourinary: Negative.  Negative for dysuria.  Musculoskeletal: Negative.  Negative for back pain.  Skin: Negative.   Neurological: Negative for dizziness, tremors, speech change, focal weakness, seizures and headaches.  Endo/Heme/Allergies: Negative.  Does not bruise/bleed easily.  Psychiatric/Behavioral: Negative.  Negative for depression, hallucinations and suicidal ideas.     PHYSICAL EXAMINATION:  GENERAL:  48 year old patient lying in the bed with no acute distress. obese NECK:  Supple, no jugular venous distention. No thyroid enlargement, no tenderness.  LUNGS: Normal breath sounds bilaterally, no wheezing, rales,rhonchi  No use of accessory muscles of respiration.  CARDIOVASCULAR: S1, S2 normal. No murmurs, rubs, or gallops.  ABDOMEN: Soft, non-tender, non-distended. Bowel sounds present. No organomegaly or mass.  EXTREMITIES: 1+ LEE NO cyanosis, or clubbing.  PSYCHIATRIC: The patient is alert and oriented x 3.  SKIN: No obvious rash, lesion, or ulcer.   DATA REVIEW:   CBC Recent Labs  Lab 11/20/18 0450  WBC 11.0*  HGB 17.0  HCT 53.0*  PLT 185    Chemistries  Recent Labs  Lab 11/19/18 1651  NA 139  K 4.6  CL 101  CO2 30  GLUCOSE 120*  BUN 15  CREATININE 0.77  CALCIUM 8.7*    Cardiac Enzymes No results for input(s): TROPONINI in the last 168 hours.  Microbiology Results  @MICRORSLT48 @  RADIOLOGY:  Ct Angio Chest Pe W And/or Wo Contrast  Result Date: 11/19/2018 CLINICAL DATA:  History of DVT hypoxia EXAM: CT ANGIOGRAPHY CHEST WITH CONTRAST TECHNIQUE: Multidetector CT imaging of the chest was performed using the standard protocol during bolus administration of intravenous contrast. Multiplanar CT image reconstructions and MIPs were obtained to evaluate the vascular anatomy. CONTRAST:  ISOVUE-370 IOPAMIDOL (ISOVUE-370) INJECTION 76% COMPARISON:  CT abdomen 10/20/2018, ultrasound 11/19/2018, CT chest 08/28/2018 FINDINGS:  Cardiovascular: Satisfactory opacification of the pulmonary arteries to the segmental level. No acute filling defects are visualized to suggest acute embolus. Crescent shaped hypodensity in the descending pulmonary artery on the right with small web like defects at proximal right lower lobe pulmonary arterial branch vessels, no change. Small web like filling defects within left lower lobe segmental branches, consistent with chronic PE, these findings are unchanged. Aorta is nonaneurysmal. Dilated pulmonary trunk measuring up to 5 cm. Mediastinum/Nodes: No enlarged mediastinal, hilar, or axillary lymph nodes. Thyroid within normal limits. Midline trachea. Right paratracheal lymph node measuring 13 mm, also no change. Esophagus within normal limits. Lungs/Pleura: Focal scarring at the right middle lobe. No acute consolidation or effusion. No pneumothorax Upper Abdomen: Gallstone Musculoskeletal: No chest wall abnormality. No acute or significant osseous findings. Degenerative changes. Review of the MIP images confirms the above findings. IMPRESSION: 1. No definite CT evidence for acute pulmonary embolus. 2. Eccentric wall thickening of the descending right pulmonary artery with small web like defects in segmental right and left lower lobe pulmonary vessels consistent with sequela of chronic PE, these findings are unchanged as compared with prior CT from December 2019.  Pulmonary trunk is enlarged, consistent with pulmonary hypertension. 3. No acute pulmonary infiltrate 4. Gallstone Electronically Signed   By: Jasmine Pang M.D.   On: 11/19/2018 19:39   US Venous Img Lower Unilateral Left  Result Date: 11/19/2018 CLINICAL DATA:  LEFT lower extremity pain for 3 days EXAM: LEFT LOWER EXTREMITY VENOUS DOPPLER ULTRASOUND TECHNIQUE: Gray-scale sonography with graded compression, as well as color Doppler and duplex ultrasound were performed to evaluate the lower extremity deep venous systems from the level of the common  femoral vein and including the common femoral, femoral, profunda femoral, popliteal and calf veins including the posterior tibial, peroneal and gastrocnemius veins when visible. The superficial great saphenous vein was also interrogated. Spectral Doppler was utilized to evaluate flow at rest and with distal augmentation maneuvers in the common femoral, femoral and popliteal veins. COMPARISON:  None FINDINGS: Contralateral Common Femoral Vein: Respiratory phasicity is normal and symmetric with the symptomatic side. No evidence of thrombus. Normal compressibility. Common Femoral Vein: No evidence of thrombus. Normal compressibility, respiratory phasicity and response to augmentation. Saphenofemoral Junction: No evidence of thrombus. Normal compressibility and flow on color Doppler imaging. Profunda Femoral Vein: No evidence of thrombus. Normal compressibility and flow on color Doppler imaging. Femoral Vein: Scattered nonocclusive hypoechoic thrombus throughout LEFT femoral vein. Impaired compressibility and diminished spontaneous venous flow. Popliteal Vein: Nonocclusive hypoechoic thrombus within LEFT popliteal vein. Impaired compressibility. Diminished spontaneous venous flow. Calf Veins: Not adequately visualized due to body habitus. Superficial Great Saphenous Vein: Distended with hypoechoic thrombus from the knee to the ankle. Impaired compressibility and absent spontaneous venous flow. Venous Reflux:  None. Other Findings:  None. IMPRESSION: Acute appearing deep venous thrombosis involving the LEFT femoral and popliteal veins. Superficial thrombophlebitis involving greater saphenous vein at the LEFT calf to LEFT knee. Electronically Signed   By: Ulyses Southward M.D.   On: 11/19/2018 18:07      Allergies as of 11/20/2018   No Known Allergies     Medication List    STOP taking these medications   benzonatate 100 MG capsule Commonly known as:  Tessalon Perles   enoxaparin 40 MG/0.4ML injection Commonly  known as:  LOVENOX   HYDROcodone-acetaminophen 5-325 MG tablet Commonly known as:  NORCO/VICODIN   oxyCODONE-acetaminophen 5-325 MG tablet Commonly known as:  Percocet   phenazopyridine 200 MG tablet Commonly known as:  Pyridium   predniSONE 10 MG tablet Commonly known as:  DELTASONE   predniSONE 5 MG tablet Commonly known as:  DELTASONE   traMADol 50 MG tablet Commonly known as:  Ultram   warfarin 5 MG tablet Commonly known as:  COUMADIN     TAKE these medications   albuterol 1.25 MG/3ML nebulizer solution Commonly known as:  ACCUNEB Take 1 ampule by nebulization every 6 (six) hours as needed for wheezing.   furosemide 20 MG tablet Commonly known as:  LASIX Take 1 tablet (20 mg total) by mouth daily.   oxyCODONE 5 MG immediate release tablet Commonly known as:  Oxy IR/ROXICODONE Take 1 tablet (5 mg total) by mouth every 4 (four) hours as needed for moderate pain.   Rivaroxaban 15 MG Tabs tablet Commonly known as:  XARELTO Take 1 tablet (15 mg total) by mouth 2 (two) times daily with a meal for 20 days.   rivaroxaban 20 MG Tabs tablet Commonly known as:  XARELTO Take 1 tablet (20 mg total) by mouth daily with supper. Start taking on:  December 11, 2018  Management plans discussed with the patient and he is in agreement. Stable for discharge home  Patient should follow up with dr dew monday  CODE STATUS:     Code Status Orders  (From admission, onward)         Start     Ordered   11/19/18 2235  Full code  Continuous     11/19/18 2234        Code Status History    This patient has a current code status but no historical code status.      TOTAL TIME TAKING CARE OF THIS PATIENT: 38 minutes.    Note: This dictation was prepared with Dragon dictation along with smaller phrase technology. Any transcriptional errors that result from this process are unintentional.  Adrian SaranSital Pasqualino Witherspoon M.D on 11/20/2018 at 11:19 AM  Between 7am to 6pm - Pager -  [phone] After 6pm go to [email or web address] - password [email or web address]  [facility]  Office  [phone]  CC: Primary care physician; Patient, No Pcp Per

## 2018-11-20 NOTE — Progress Notes (Addendum)
ANTICOAGULATION CONSULT NOTE - Initial Consult  Pharmacy Consult for Heparin  Indication: DVT  No Known Allergies  Patient Measurements: Height: 5\' 7"  (170.2 cm) Weight: (!) 301 lb (136.5 kg) IBW/kg (Calculated) : 66.1 Heparin Dosing Weight:  98.8 kg   Vital Signs: Temp: 98.7 F (37.1 C) (03/19 0431) Temp Source: Oral (03/19 0431) BP: 91/64 (03/19 0431) Pulse Rate: 82 (03/19 0442)  Labs: Recent Labs    11/19/18 1651 11/19/18 2142 11/20/18 0450  HGB 18.9*  --  17.0  HCT 57.4*  --  53.0*  PLT 203  --  185  APTT  --  41*  --   LABPROT 29.6*  --   --   INR 2.9*  --   --   HEPARINUNFRC  --   --  0.26*  CREATININE 0.77  --   --     Estimated Creatinine Clearance: 150.6 mL/min (by C-G formula based on SCr of 0.77 mg/dL).   Medical History: Past Medical History:  Diagnosis Date  . Hx of blood clots   . Sleep apnea     Medications:  Medications Prior to Admission  Medication Sig Dispense Refill Last Dose  . furosemide (LASIX) 20 MG tablet Take 1 tablet (20 mg total) by mouth daily. 30 tablet 0 11/19/2018 at 0800  . warfarin (COUMADIN) 5 MG tablet Take 2 tablets (10 mg total) by mouth one time only at 6 PM for 1 dose. (Patient taking differently: Take 15 mg by mouth one time only at 6 PM. Take three tablets by mouth daily at 6 pm) 60 tablet 2 11/18/2018 at 1800  . albuterol (ACCUNEB) 1.25 MG/3ML nebulizer solution Take 1 ampule by nebulization every 6 (six) hours as needed for wheezing.   Not Taking  . benzonatate (TESSALON PERLES) 100 MG capsule Take 1 capsule (100 mg total) by mouth 2 (two) times daily. (Patient not taking: Reported on 11/06/2018) 20 capsule 0 Not Taking  . enoxaparin (LOVENOX) 40 MG/0.4ML injection Inject 1.3 mLs (130 mg total) into the skin 2 (two) times daily for 7 days. 40 mL 0   . HYDROcodone-acetaminophen (NORCO/VICODIN) 5-325 MG tablet Take 1 tablet by mouth every 6 (six) hours as needed for moderate pain. (Patient not taking: Reported on 10/16/2018)  12 tablet 0 Not Taking  . oxyCODONE-acetaminophen (PERCOCET) 5-325 MG tablet Take 1 tablet by mouth every 4 (four) hours as needed for severe pain. (Patient not taking: Reported on 11/06/2018) 12 tablet 0 Not Taking  . phenazopyridine (PYRIDIUM) 200 MG tablet Take 1 tablet (200 mg total) by mouth 3 (three) times daily as needed for pain. (Patient not taking: Reported on 11/06/2018) 20 tablet 0 Not Taking  . predniSONE (DELTASONE) 10 MG tablet Take 6 tablets  today, on day 2 take 5 tablets, day 3 take 4 tablets, day 4 take 3 tablets, day 5 take  2 tablets and 1 tablet the last day (Patient not taking: Reported on 10/16/2018) 21 tablet 0 Not Taking  . predniSONE (DELTASONE) 5 MG tablet Take 8 tablets (40 mg total) by mouth daily with breakfast. 40 mg day 1, 30 mg day 2, 20 mg day 3, 10 mg day 1 (Patient not taking: Reported on 11/06/2018) 20 tablet 0 Not Taking  . traMADol (ULTRAM) 50 MG tablet Take 1 tablet (50 mg total) by mouth every 6 (six) hours as needed. (Patient not taking: Reported on 11/06/2018) 12 tablet 0 Not Taking    Assessment: Pharmacy consulted to dose heparin in this 48 year old  male with DVT.  Was treated from 2/21 - 2/28 with lovenox 130 mg SQ Q12H and then transitioned to warfarin.   Pt was on warfarin 15 mg PO daily , last dose was on 3/17.   Pt presents today with new pain in left leg, concerned could be a new DVT.   Pt is currently being considered for thrombolysis.  3/18:  INR = 2.9  3/18 Heparin infusion started @ 1600 units/hr   Goal of Therapy:  Heparin level 0.3-0.7 units/ml Monitor platelets by anticoagulation protocol: Yes   Plan:  3/19 @ 0450 Heparin Level 0.26. Level is subtherapeutic.  Will order a 1500 unit bolus Increase infusion to 1750 units/hr  Check anti-Xa level in 6 hours and daily while on heparin Continue to monitor H&H and platelets  Gardner Candle, PharmD, BCPS Clinical Pharmacist 11/20/2018 5:20 AM

## 2018-11-23 ENCOUNTER — Other Ambulatory Visit (INDEPENDENT_AMBULATORY_CARE_PROVIDER_SITE_OTHER): Payer: Self-pay | Admitting: Nurse Practitioner

## 2018-11-24 ENCOUNTER — Other Ambulatory Visit: Payer: Self-pay

## 2018-11-24 ENCOUNTER — Ambulatory Visit
Admission: RE | Admit: 2018-11-24 | Discharge: 2018-11-24 | Disposition: A | Discharge status: Home / Self Care | Payer: Self-pay | Attending: Vascular Surgery | Admitting: Vascular Surgery

## 2018-11-24 ENCOUNTER — Encounter
Admission: RE | Disposition: A | Discharge status: Home / Self Care | Payer: Self-pay | Source: Home / Self Care | Attending: Vascular Surgery

## 2018-11-24 DIAGNOSIS — Z79899 Other long term (current) drug therapy: Secondary | ICD-10-CM | POA: Insufficient documentation

## 2018-11-24 DIAGNOSIS — I825Y2 Chronic embolism and thrombosis of unspecified deep veins of left proximal lower extremity: Secondary | ICD-10-CM | POA: Insufficient documentation

## 2018-11-24 DIAGNOSIS — Z6841 Body Mass Index (BMI) 40.0 and over, adult: Secondary | ICD-10-CM | POA: Insufficient documentation

## 2018-11-24 DIAGNOSIS — G4730 Sleep apnea, unspecified: Secondary | ICD-10-CM | POA: Insufficient documentation

## 2018-11-24 DIAGNOSIS — I82412 Acute embolism and thrombosis of left femoral vein: Secondary | ICD-10-CM

## 2018-11-24 DIAGNOSIS — R600 Localized edema: Secondary | ICD-10-CM | POA: Insufficient documentation

## 2018-11-24 DIAGNOSIS — F17210 Nicotine dependence, cigarettes, uncomplicated: Secondary | ICD-10-CM | POA: Insufficient documentation

## 2018-11-24 DIAGNOSIS — I82432 Acute embolism and thrombosis of left popliteal vein: Secondary | ICD-10-CM

## 2018-11-24 DIAGNOSIS — Z7901 Long term (current) use of anticoagulants: Secondary | ICD-10-CM | POA: Insufficient documentation

## 2018-11-24 HISTORY — PX: IVC FILTER INSERTION: CATH118245

## 2018-11-24 LAB — PROTEIN C AG + PROTEIN S AG
Protein C, Total: 48 % — ABNORMAL LOW (ref 60–150)
Protein S Ag, Free: 26 % — ABNORMAL LOW (ref 57–157)
Protein S Ag, Total: 40 % — ABNORMAL LOW (ref 60–150)

## 2018-11-24 LAB — ANTIPHOSPHOLIPID SYNDROME EVAL, BLD
Anticardiolipin IgG: <9 GPL U/mL (ref 0–14)
DRVVT: 79.6 s — ABNORMAL HIGH (ref 0.0–47.0)
PTT Lupus Anticoagulant: 53.3 s — ABNORMAL HIGH (ref 0.0–51.9)
Phosphatydalserine, IgA: 3 APS IgA (ref 0–20)
Phosphatydalserine, IgG: 8 GPS IgG (ref 0–11)
Phosphatydalserine, IgM: 8 MPS IgM (ref 0–25)

## 2018-11-24 LAB — DRVVT MIX: DRVVT MIX: 42.8 s (ref 0.0–47.0)

## 2018-11-24 LAB — PTT-LA INCUB MIX: PTT-LA INCUB MIX: 52.9 s — AB (ref 0.0–48.9)

## 2018-11-24 LAB — ANTITHROMBIN III ANTIGEN: AT III AG PPP IMM-ACNC: 70 % — ABNORMAL LOW (ref 72–124)

## 2018-11-24 LAB — HEXAGONAL PHASE PHOSPHOLIPID: Hexagonal Phase Phospholipid: 0 s (ref 0–11)

## 2018-11-24 LAB — PTT-LA MIX: PTT-LA Mix: 43.8 s (ref 0.0–48.9)

## 2018-11-24 SURGERY — IVC FILTER INSERTION
Anesthesia: Moderate Sedation | Laterality: Left

## 2018-11-24 MED ORDER — MIDAZOLAM HCL 2 MG/ML PO SYRP: 8.0000 mg | ORAL_SOLUTION | Freq: Once | ORAL | Status: DC | PRN

## 2018-11-24 MED ORDER — ACETAMINOPHEN 500 MG PO TABS
ORAL_TABLET | ORAL | Status: AC
  Filled 2018-11-24: qty 2

## 2018-11-24 MED ORDER — CEFAZOLIN SODIUM-DEXTROSE 2-4 GM/100ML-% IV SOLN
INTRAVENOUS | Status: AC
  Filled 2018-11-24: qty 100

## 2018-11-24 MED ORDER — HEPARIN SODIUM (PORCINE) 1000 UNIT/ML IJ SOLN
INTRAMUSCULAR | Status: AC
  Filled 2018-11-24: qty 1

## 2018-11-24 MED ORDER — CEFAZOLIN SODIUM-DEXTROSE 2-4 GM/100ML-% IV SOLN: 2.0000 g | Freq: Once | INTRAVENOUS | Status: DC

## 2018-11-24 MED ORDER — MIDAZOLAM HCL 2 MG/2ML IJ SOLN
INTRAMUSCULAR | Status: DC | PRN
  Administered 2018-11-24: 1 mg via INTRAVENOUS
  Administered 2018-11-24: 2 mg via INTRAVENOUS
  Administered 2018-11-24 (×4): 1 mg via INTRAVENOUS

## 2018-11-24 MED ORDER — HEPARIN (PORCINE) IN NACL 1000-0.9 UT/500ML-% IV SOLN
INTRAVENOUS | Status: AC
  Filled 2018-11-24: qty 1000

## 2018-11-24 MED ORDER — DIPHENHYDRAMINE HCL 50 MG/ML IJ SOLN: 50.0000 mg | Freq: Once | INTRAMUSCULAR | Status: DC | PRN

## 2018-11-24 MED ORDER — METHYLPREDNISOLONE SODIUM SUCC 125 MG IJ SOLR: 125.0000 mg | Freq: Once | INTRAMUSCULAR | Status: DC | PRN

## 2018-11-24 MED ORDER — MIDAZOLAM HCL 5 MG/5ML IJ SOLN
INTRAMUSCULAR | Status: AC
  Filled 2018-11-24: qty 5

## 2018-11-24 MED ORDER — IOHEXOL 300 MG/ML  SOLN
INTRAMUSCULAR | Status: DC | PRN
  Administered 2018-11-24: 80 mL via INTRAVENOUS

## 2018-11-24 MED ORDER — ONDANSETRON HCL 4 MG/2ML IJ SOLN: 4.0000 mg | Freq: Four times a day (QID) | INTRAMUSCULAR | Status: DC | PRN

## 2018-11-24 MED ORDER — LIDOCAINE-EPINEPHRINE (PF) 1 %-1:200000 IJ SOLN
INTRAMUSCULAR | Status: AC
  Filled 2018-11-24: qty 30

## 2018-11-24 MED ORDER — FAMOTIDINE 20 MG PO TABS: 40.0000 mg | ORAL_TABLET | Freq: Once | ORAL | Status: DC | PRN

## 2018-11-24 MED ORDER — FENTANYL CITRATE (PF) 100 MCG/2ML IJ SOLN
INTRAMUSCULAR | Status: AC
  Filled 2018-11-24: qty 2

## 2018-11-24 MED ORDER — ALTEPLASE 2 MG IJ SOLR
INTRAMUSCULAR | Status: DC | PRN
  Administered 2018-11-24: 8 mg

## 2018-11-24 MED ORDER — HEPARIN SODIUM (PORCINE) 1000 UNIT/ML IJ SOLN
INTRAMUSCULAR | Status: DC | PRN
  Administered 2018-11-24: 4000 [IU] via INTRAVENOUS

## 2018-11-24 MED ORDER — ACETAMINOPHEN 500 MG PO TABS
1000.0000 mg | ORAL_TABLET | Freq: Once | ORAL | Status: AC
  Administered 2018-11-24: 1000 mg via ORAL

## 2018-11-24 MED ORDER — HYDROMORPHONE HCL 1 MG/ML IJ SOLN: 1.0000 mg | Freq: Once | INTRAMUSCULAR | Status: DC | PRN

## 2018-11-24 MED ORDER — SODIUM CHLORIDE 0.9 % IV SOLN: INTRAVENOUS | Status: DC

## 2018-11-24 MED ORDER — FENTANYL CITRATE (PF) 100 MCG/2ML IJ SOLN
INTRAMUSCULAR | Status: DC | PRN
  Administered 2018-11-24: 50 ug via INTRAVENOUS
  Administered 2018-11-24 (×4): 25 ug via INTRAVENOUS

## 2018-11-24 SURGICAL SUPPLY — 15 items
BALLN ULTRVRSE 130X300X6 (BALLOONS) ×1
BALLN ULTRVRSE 6X300X130 (BALLOONS) ×2
BALLOON ULTRVRSE 130X300X6 (BALLOONS) ×1 IMPLANT
CANISTER PENUMBRA ENGINE (MISCELLANEOUS) ×3 IMPLANT
CANNULA 5F STIFF (CANNULA) ×3 IMPLANT
CATH BEACON 5 .035 65 KMP TIP (CATHETERS) ×3 IMPLANT
CATH INDIGO D 50CM (CATHETERS) ×3 IMPLANT
DEVICE PRESTO INFLATION (MISCELLANEOUS) ×3 IMPLANT
DEVICE SAFEGUARD 24CM (GAUZE/BANDAGES/DRESSINGS) ×3 IMPLANT
FILTER VC CELECT-FEMORAL (Filter) ×3 IMPLANT
GLIDEWIRE ADV .035X180CM (WIRE) ×3 IMPLANT
PACK ANGIOGRAPHY (CUSTOM PROCEDURE TRAY) ×3 IMPLANT
SET INTRO CAPELLA COAXIAL (SET/KITS/TRAYS/PACK) ×3 IMPLANT
SHEATH 9FRX11 (SHEATH) ×3 IMPLANT
WIRE J 3MM .035X145CM (WIRE) ×3 IMPLANT

## 2018-11-24 NOTE — H&P (Signed)
[facility] History & Physical Update  The patient was interviewed and re-examined.  The patient's previous History and Physical has been reviewed and is unchanged.  There is no change in the plan of care. We plan to proceed with the scheduled procedure.  Festus Barren, MD  11/24/2018, 8:05 AM

## 2018-11-24 NOTE — Op Note (Signed)
[facility]   OPERATIVE NOTE   PRE-OPERATIVE DIAGNOSIS: symptomatic LLE DVT  POST-OPERATIVE DIAGNOSIS: same   PROCEDURE: 1. US guidance for vascular access to right femoral vein and left popliteal vein 2. Catheter placement into IVC from right femoral vein approach and into the left iliac vein from the left popliteal approach 3. IVC gram and left lower extremity venogram 4. IVC filter placement 5.   Catheter directed thrombolysis with 8 mg of TPA to the left popliteal, SFV, and common femoral veins 6. Mechanical thrombectomy to left popliteal, SFV, and common femoral veins 7. PTA of left popliteal and SFV with 6 mm balloon    SURGEON: Matthew Pain, MD  ASSISTANT(S): none  ANESTHESIA: local with moderate conscious sedation for 45 minutes using 7 mg of Versed and 150 Mcg of Fentanyl  ESTIMATED BLOOD LOSS: 200 cc  FINDING(S): 1. Occlusion of the left popliteal and SFV up to the common femoral vein  SPECIMEN(S): none  INDICATIONS:  Patient is a 48 y.o. male who presents with left lower extremity DVT. Patient has marked leg swelling and [name]. Venous intervention is performed to reduce the symtpoms and avoid long term postphlebitic symptoms.   DESCRIPTION: After obtaining full informed written consent, the patient was brought back to the vascular suite and placed supine upon the table.Moderate conscious sedation was administered during a face to face encounter with the patient throughout the procedure with my supervision of the RN administering medicines and monitoring the patient's vital signs, pulse oximetry, telemetry and mental status throughout from the start of the procedure until the patient was taken to the recovery room. After obtaining adequate anesthesia, the patient was prepped and draped in the standard fashion. The right femoral vein was then accessed under US guidance and found to be widely patent. It was accessed without  difficulty and a permanent image was recorded. I then placed the delivery sheath into the IVC. The IVC was patent and the renal veins were at the level of L1. The retrievable IVC filter was then deployed at the level of L2. The delivery sheath was then removed and dressings were placed in the right groin.  The patient was then placed into the prone position. The left popliteal vein was then accessed under direct ultrasound guidance without difficulty with a micropuncture needle and a permanent image was recorded. I then upsized to an 9Fr sheath over a J wire. 4000 units of heparin were then given. Imaging showed occlusion of the with minimal flow. A Kumpe catheter and Magic tourque wire were then advanced into the CFV and then into the left external iliac vein and images were performed. The vessel normalized in the common femoral vein and the iliac veins were normal without stenosis or occlusion.  There was no evidence of May Thurner syndrome.  I was able to cross the thrombus and stenosis and advance into the IVC which was patent. I then used the penumbra cat D catheter and instilled 8 mg of tpa throughout the left popliteal, superficial femoral, and common femoral veins.  After this dwelled, I used the Penumbra Cat D catheter and evacuated about 200 of effluent with mechanical thrombectomy throughout the CFV, SFV, and popliteal veins. This had mild improvement. I then treated the popliteal vein, SFV, and CFV with a 6 mm diameter by 30 cm length angioplasty balloon to open a channel. This was inflated to 12 atm for 1 minute.  This resulted significant improvement with less than 30% residual stenosis and  significantly improved flow up into the pelvic veins and IVC from the popliteal injection.  There was reflux that one could identify and still significant collaterals consistent with some degree of chronic disease as well.  I felt we had done all we can do at this point.  I then elected to terminate  the procedure. The sheath was removed and a dressing was placed. She was taken to the recovery room in stable condition having tolerated the procedure well.   COMPLICATIONS: None  CONDITION: Stable  Matthew [name] 11/24/2018 10:57 AM

## 2018-11-24 NOTE — Progress Notes (Addendum)
Called patient's wife to review discharge instructions, follow up appt, etc. Wife stated that she is at work and does not get off until 2:45pm, at which point she would come pickup patient from hospital. She requested to wait and review instructions at that time.

## 2018-11-24 NOTE — Discharge Instructions (Signed)
° °-   You may remove the wrap on you left leg tomorrow, Tuesday 11/25/2018.  - Please keep your left leg elevated as much as possible when seated/laying.

## 2018-11-25 ENCOUNTER — Encounter (INDEPENDENT_AMBULATORY_CARE_PROVIDER_SITE_OTHER): Payer: Self-pay

## 2018-11-25 ENCOUNTER — Encounter: Payer: Self-pay | Admitting: Vascular Surgery

## 2018-11-25 ENCOUNTER — Telehealth (INDEPENDENT_AMBULATORY_CARE_PROVIDER_SITE_OTHER): Payer: Self-pay

## 2018-11-25 LAB — PROTHROMBIN GENE MUTATION

## 2018-11-25 LAB — FACTOR 5 LEIDEN

## 2018-11-25 NOTE — Telephone Encounter (Signed)
Spoke with the patient and gave him his information regarding his procedure with Dr. Wyn Quaker. [facility] at 6:45 am for his procedure. This information will also be mailed out as well.

## 2018-11-25 NOTE — Telephone Encounter (Signed)
Patient was called and told not to have anyone with him for his procedure.

## 2018-12-01 ENCOUNTER — Encounter
Admission: RE | Disposition: A | Discharge status: Home / Self Care | Payer: Self-pay | Source: Home / Self Care | Attending: Vascular Surgery

## 2018-12-01 ENCOUNTER — Other Ambulatory Visit: Payer: Self-pay

## 2018-12-01 ENCOUNTER — Ambulatory Visit
Admission: RE | Admit: 2018-12-01 | Discharge: 2018-12-01 | Disposition: A | Discharge status: Home / Self Care | Payer: Self-pay | Attending: Vascular Surgery | Admitting: Vascular Surgery

## 2018-12-01 ENCOUNTER — Other Ambulatory Visit (INDEPENDENT_AMBULATORY_CARE_PROVIDER_SITE_OTHER): Payer: Self-pay | Admitting: Nurse Practitioner

## 2018-12-01 DIAGNOSIS — Z452 Encounter for adjustment and management of vascular access device: Secondary | ICD-10-CM | POA: Insufficient documentation

## 2018-12-01 DIAGNOSIS — Z6841 Body Mass Index (BMI) 40.0 and over, adult: Secondary | ICD-10-CM | POA: Insufficient documentation

## 2018-12-01 DIAGNOSIS — I82409 Acute embolism and thrombosis of unspecified deep veins of unspecified lower extremity: Secondary | ICD-10-CM

## 2018-12-01 DIAGNOSIS — R600 Localized edema: Secondary | ICD-10-CM | POA: Insufficient documentation

## 2018-12-01 DIAGNOSIS — Z4589 Encounter for adjustment and management of other implanted devices: Secondary | ICD-10-CM

## 2018-12-01 DIAGNOSIS — Z7901 Long term (current) use of anticoagulants: Secondary | ICD-10-CM | POA: Insufficient documentation

## 2018-12-01 DIAGNOSIS — Z79899 Other long term (current) drug therapy: Secondary | ICD-10-CM | POA: Insufficient documentation

## 2018-12-01 DIAGNOSIS — I825Y2 Chronic embolism and thrombosis of unspecified deep veins of left proximal lower extremity: Secondary | ICD-10-CM | POA: Insufficient documentation

## 2018-12-01 DIAGNOSIS — G4730 Sleep apnea, unspecified: Secondary | ICD-10-CM | POA: Insufficient documentation

## 2018-12-01 HISTORY — PX: IVC FILTER REMOVAL: CATH118246

## 2018-12-01 SURGERY — IVC FILTER REMOVAL
Anesthesia: Moderate Sedation

## 2018-12-01 MED ORDER — DIPHENHYDRAMINE HCL 50 MG/ML IJ SOLN: 50.0000 mg | Freq: Once | INTRAMUSCULAR | Status: DC | PRN

## 2018-12-01 MED ORDER — MIDAZOLAM HCL 2 MG/2ML IJ SOLN
INTRAMUSCULAR | Status: DC | PRN
  Administered 2018-12-01: 2 mg via INTRAVENOUS
  Administered 2018-12-01: 1 mg via INTRAVENOUS

## 2018-12-01 MED ORDER — IOHEXOL 300 MG/ML  SOLN
INTRAMUSCULAR | Status: DC | PRN
  Administered 2018-12-01: 15 mL via INTRAVENOUS

## 2018-12-01 MED ORDER — HYDROMORPHONE HCL 1 MG/ML IJ SOLN: 1.0000 mg | Freq: Once | INTRAMUSCULAR | Status: DC | PRN

## 2018-12-01 MED ORDER — MIDAZOLAM HCL 2 MG/ML PO SYRP: 8.0000 mg | ORAL_SOLUTION | Freq: Once | ORAL | Status: DC | PRN

## 2018-12-01 MED ORDER — ONDANSETRON HCL 4 MG/2ML IJ SOLN: 4.0000 mg | Freq: Four times a day (QID) | INTRAMUSCULAR | Status: DC | PRN

## 2018-12-01 MED ORDER — SODIUM CHLORIDE 0.9 % IV SOLN
INTRAVENOUS | Status: DC
  Administered 2018-12-01: 07:00:00 via INTRAVENOUS

## 2018-12-01 MED ORDER — MIDAZOLAM HCL 5 MG/5ML IJ SOLN
INTRAMUSCULAR | Status: AC
  Filled 2018-12-01: qty 5

## 2018-12-01 MED ORDER — FAMOTIDINE 20 MG PO TABS: 40.0000 mg | ORAL_TABLET | Freq: Once | ORAL | Status: DC | PRN

## 2018-12-01 MED ORDER — METHYLPREDNISOLONE SODIUM SUCC 125 MG IJ SOLR: 125.0000 mg | Freq: Once | INTRAMUSCULAR | Status: DC | PRN

## 2018-12-01 MED ORDER — CEFAZOLIN SODIUM-DEXTROSE 2-4 GM/100ML-% IV SOLN
2.0000 g | Freq: Once | INTRAVENOUS | Status: AC
  Administered 2018-12-01: 2 g via INTRAVENOUS

## 2018-12-01 MED ORDER — FENTANYL CITRATE (PF) 100 MCG/2ML IJ SOLN
INTRAMUSCULAR | Status: DC | PRN
  Administered 2018-12-01: 25 ug via INTRAVENOUS
  Administered 2018-12-01: 50 ug via INTRAVENOUS

## 2018-12-01 MED ORDER — FENTANYL CITRATE (PF) 100 MCG/2ML IJ SOLN
INTRAMUSCULAR | Status: AC
  Filled 2018-12-01: qty 2

## 2018-12-01 SURGICAL SUPPLY — 3 items
PACK ANGIOGRAPHY (CUSTOM PROCEDURE TRAY) ×3 IMPLANT
SET VENACAVA FILTER RETRIEVAL (MISCELLANEOUS) ×3 IMPLANT
WIRE J 3MM .035X145CM (WIRE) ×3 IMPLANT

## 2018-12-01 NOTE — Op Note (Signed)
[facility]   OPERATIVE NOTE    PRE-OPERATIVE DIAGNOSIS:  1. DVT 2. status post IVC filter placement  POST-OPERATIVE DIAGNOSIS: Same as above  PROCEDURE: 1. Ultrasound guidance for vascular access right jugular vein 2. Catheter placement into inferior vena cava from right jugular vein 3. Inferior venacavogram 4. Retrieval of Cook Celect IVC filter  SURGEON: Festus Barren, MD  ASSISTANT(S): None  ANESTHESIA: Local with moderate conscious sedation for approximately 15 minutes using 3 mg of Versed and 75 mcg of Fentanyl  ESTIMATED BLOOD LOSS: 5  CONTRAST:  15 cc  FLUORO TIME:  0.5 minutes  FINDING(S): 1. patent IVC  SPECIMEN(S): IVC filter  INDICATIONS:  Patient is a 48 y.o. male who presents with a previous history of IVC filter placement. Patient has tolerated anticoagulation and has small volume residual DVT and no longer needs this filter. The patient remains on anticoagulation. Risks and benefits were discussed, and informed consent was obtained.  DESCRIPTION: After obtaining full informed written consent, the patient was brought back to the vascular suite and placed supine upon the table.Moderate conscious sedation was administered during a face to face encounter with the patient throughout the procedure with my supervision of the RN administering medicines and monitoring the patient's vital signs, pulse oximetry, telemetry and mental status throughout from the start of the procedure until the patient was taken to the recovery room.  After obtaining adequate anesthesia, the patient was prepped and draped in the standard fashion. The right jugular vein was visualized with ultrasound and found to be widely patent. It was then accessed under direct ultrasound guidance without difficulty with the Seldinger needle and a permanent image was recorded. A J-wire was placed. After skin nick and dilatation, the retrieval sheath was placed over the wire  and advanced into the inferior vena cava. Inferior vena cava was imaged and found to be widely patent on inferior venacavogram. The filter was slightly tilted in its orientation. The retrieval snare was then placed through the sheath and the hook of the filter was snared without difficulty. The sheath was then advanced, and the filter was collapsed and brought into the sheath in its entirety. It was then removed from the body in its entirety. The retrieval sheath was then removed. Pressure was held at the access site and sterile dressing was placed. The patient was taken to the recovery room in stable condition having tolerated the procedure well.  COMPLICATIONS: None  CONDITION: Stable   Festus Barren 12/01/2018 8:37 AM  This note was created with Dragon Medical transcription system. Any errors in dictation are purely unintentional.

## 2018-12-01 NOTE — H&P (Signed)
[facility] History & Physical Update  The patient was interviewed and re-examined.  The patient's previous History and Physical has been reviewed and is unchanged.  There is no change in the plan of care. We plan to proceed with the scheduled procedure.  Festus Barren, MD  12/01/2018, 8:05 AM

## 2018-12-03 ENCOUNTER — Other Ambulatory Visit: Payer: Self-pay

## 2018-12-03 ENCOUNTER — Ambulatory Visit: Payer: Self-pay | Admitting: Gerontology

## 2018-12-03 ENCOUNTER — Encounter: Payer: Self-pay | Admitting: Gerontology

## 2018-12-03 DIAGNOSIS — R600 Localized edema: Secondary | ICD-10-CM

## 2018-12-03 DIAGNOSIS — F419 Anxiety disorder, unspecified: Secondary | ICD-10-CM

## 2018-12-03 DIAGNOSIS — Z0000 Encounter for general adult medical examination without abnormal findings: Secondary | ICD-10-CM

## 2018-12-03 MED ORDER — FUROSEMIDE 20 MG PO TABS: 20.0000 mg | ORAL_TABLET | Freq: Every day | ORAL | 1 refills | Status: DC

## 2018-12-03 NOTE — Patient Instructions (Signed)
Carbohydrate Counting for Diabetes Mellitus, Adult  Carbohydrate counting is a method of keeping track of how many carbohydrates you eat. Eating carbohydrates naturally increases the amount of sugar (glucose) in the blood. Counting how many carbohydrates you eat helps keep your blood glucose within normal limits, which helps you manage your diabetes (diabetes mellitus). It is important to know how many carbohydrates you can safely have in each meal. This is different for every person. A diet and nutrition specialist (registered dietitian) can help you make a meal plan and calculate how many carbohydrates you should have at each meal and snack. Carbohydrates are found in the following foods:  Grains, such as breads and cereals.  Dried beans and soy products.  Starchy vegetables, such as potatoes, peas, and corn.  Fruit and fruit juices.  Milk and yogurt.  Sweets and snack foods, such as cake, cookies, candy, chips, and soft drinks. How do I count carbohydrates? There are two ways to count carbohydrates in food. You can use either of the methods or a combination of both. Reading "Nutrition Facts" on packaged food The "Nutrition Facts" list is included on the labels of almost all packaged foods and beverages in the [place] It includes:  The serving size.  Information about nutrients in each serving, including the grams (g) of carbohydrate per serving. To use the "Nutrition Facts":  Decide how many servings you will have.  Multiply the number of servings by the number of carbohydrates per serving.  The resulting number is the total amount of carbohydrates that you will be having. Learning standard serving sizes of other foods When you eat carbohydrate foods that are not packaged or do not include "Nutrition Facts" on the label, you need to measure the servings in order to count the amount of carbohydrates:  Measure the foods that you will eat with a food scale or measuring cup, if needed.   Decide how many standard-size servings you will eat.  Multiply the number of servings by 15. Most carbohydrate-rich foods have about 15 g of carbohydrates per serving. ? For example, if you eat 8 oz (170 g) of strawberries, you will have eaten 2 servings and 30 g of carbohydrates (2 servings x 15 g = 30 g).  For foods that have more than one food mixed, such as soups and casseroles, you must count the carbohydrates in each food that is included. The following list contains standard serving sizes of common carbohydrate-rich foods. Each of these servings has about 15 g of carbohydrates:   hamburger bun or  English muffin.   oz (15 mL) syrup.   oz (14 g) jelly.  1 slice of bread.  1 six-inch tortilla.  3 oz (85 g) cooked rice or pasta.  4 oz (113 g) cooked dried beans.  4 oz (113 g) starchy vegetable, such as peas, corn, or potatoes.  4 oz (113 g) hot cereal.  4 oz (113 g) mashed potatoes or  of a large baked potato.  4 oz (113 g) canned or frozen fruit.  4 oz (120 mL) fruit juice.  4-6 crackers.  6 chicken nuggets.  6 oz (170 g) unsweetened dry cereal.  6 oz (170 g) plain fat-free yogurt or yogurt sweetened with artificial sweeteners.  8 oz (240 mL) milk.  8 oz (170 g) fresh fruit or one small piece of fruit.  24 oz (680 g) popped popcorn. Example of carbohydrate counting Sample meal  3 oz (85 g) chicken breast.  6 oz (170 g)   brown rice.  4 oz (113 g) corn.  8 oz (240 mL) milk.  8 oz (170 g) strawberries with sugar-free whipped topping. Carbohydrate calculation 1. Identify the foods that contain carbohydrates: ? Rice. ? Corn. ? Milk. ? Strawberries. 2. Calculate how many servings you have of each food: ? 2 servings rice. ? 1 serving corn. ? 1 serving milk. ? 1 serving strawberries. 3. Multiply each number of servings by 15 g: ? 2 servings rice x 15 g = 30 g. ? 1 serving corn x 15 g = 15 g. ? 1 serving milk x 15 g = 15 g. ? 1 serving  strawberries x 15 g = 15 g. 4. Add together all of the amounts to find the total grams of carbohydrates eaten: ? 30 g + 15 g + 15 g + 15 g = 75 g of carbohydrates total. Summary  Carbohydrate counting is a method of keeping track of how many carbohydrates you eat.  Eating carbohydrates naturally increases the amount of sugar (glucose) in the blood.  Counting how many carbohydrates you eat helps keep your blood glucose within normal limits, which helps you manage your diabetes.  A diet and nutrition specialist (registered dietitian) can help you make a meal plan and calculate how many carbohydrates you should have at each meal and snack. This information is not intended to replace advice given to you by your health care provider. Make sure you discuss any questions you have with your health care provider. Document Released: 08/20/2005 Document Revised: 02/27/2017 Document Reviewed: 02/01/2016 Elsevier Interactive Patient Education  2019 Elsevier Inc.  

## 2018-12-03 NOTE — Progress Notes (Signed)
Established Patient Office Visit  Subjective:  Patient ID: Matthew [name], male    DOB: 1971-05-21  Age: 48 y.o. MRN: [id number]  CC:  Chief Complaint  Patient presents with  . Follow-up    HPI Matthew [name] presents for follow up on DVT and anxiety.  Event: He was discharged on 11/20/18 for left lower extremity DVT and thrombectomy was done on 12/01/18 by Dr Fatima Sanger. He was started on Xarelto 15 mg bid and on 12/11/18 he will continue on 20 mg Xarelto with supper. He reports that he's complying with current medication regimen. He continues to take 20 mg of Furosemide daily  for bilateral lower extremity edema , and he reports that swelling has resolved 50%.    He denies erythema and claudication to bilateral lower extremities. He continues to smoke cigar and denies the desire to quit.  Morbid Obesity: He reports that he's working on losing weight.  Anxiety: He states that his nerve is not right and he is anxious about his sister inlaw living wit his family. He requests an appointment with Ms. Simpson. He denies  suicidal and homicidal ideation. He denies fever, chills, shortness of breath, chest pain, palpitation and any further concerns.  Past Medical History:  Diagnosis Date  . Hx of blood clots   . Sleep apnea     Past Surgical History:  Procedure Laterality Date  . IVC FILTER INSERTION Left 11/24/2018   Procedure: IVC FILTER INSERTION;  Surgeon: Algernon Huxley, MD;  Location: [facility];  Service: Cardiovascular;  Laterality: Left;  . IVC FILTER REMOVAL N/A 12/01/2018   Procedure: IVC FILTER REMOVAL;  Surgeon: Algernon Huxley, MD;  Location: [facility];  Service: Cardiovascular;  Laterality: N/A;  . NO PAST SURGERIES      History reviewed. No pertinent family history.  Social History   Socioeconomic History  . Marital status: Married    Spouse name: [name]  . Number of children: 4  . Years of education: Not on file  . Highest education level: Not on file   Occupational History  . Not on file  Social Needs  . Financial resource strain: Not hard at all  . Food insecurity:    Worry: Never true    Inability: Never true  . Transportation needs:    Medical: No    Non-medical: No  Tobacco Use  . Smoking status: Current Every Day Smoker    Packs/day: 0.50    Types: Cigarettes  . Smokeless tobacco: Never Used  Substance and Sexual Activity  . Alcohol use: Yes    Comment: occassional  . Drug use: Never  . Sexual activity: Yes  Lifestyle  . Physical activity:    Days per week: Not on file    Minutes per session: Not on file  . Stress: Only a little  Relationships  . Social connections:    Talks on phone: More than three times a week    Gets together: Not on file    Attends religious service: Not on file    Active member of club or organization: Not on file    Attends meetings of clubs or organizations: Not on file    Relationship status: Not on file  . Intimate partner violence:    Fear of current or ex partner: No    Emotionally abused: No    Physically abused: No    Forced sexual activity: No  Other Topics Concern  . Not on file  Social History Narrative  . Not on file    Outpatient Medications Prior to Visit  Medication Sig Dispense Refill  . albuterol (ACCUNEB) 1.25 MG/3ML nebulizer solution Take 1 ampule by nebulization every 6 (six) hours as needed for wheezing.    Marland Kitchen oxyCODONE (OXY IR/ROXICODONE) 5 MG immediate release tablet Take 1 tablet (5 mg total) by mouth every 4 (four) hours as needed for moderate pain. 12 tablet 0  . Rivaroxaban (XARELTO) 15 MG TABS tablet Take 1 tablet (15 mg total) by mouth 2 (two) times daily with a meal for 20 days. 42 tablet 0  . [START ON 12/11/2018] rivaroxaban (XARELTO) 20 MG TABS tablet Take 1 tablet (20 mg total) by mouth daily with supper. 30 tablet 0  . furosemide (LASIX) 20 MG tablet Take 1 tablet (20 mg total) by mouth daily. 30 tablet 0   No facility-administered medications prior  to visit.     No Known Allergies  ROS Review of Systems  Constitutional: Negative.   HENT: Negative.   Respiratory: Negative.   Cardiovascular: Leg swelling: mild  swelling to both legs.  Gastrointestinal: Negative.  Abdominal distention: central obesity.  Genitourinary: Negative.   Musculoskeletal: Negative.   Skin: Negative.   Neurological: Negative.   Psychiatric/Behavioral: Nervous/anxious: states his nerve is not right.       Objective:    Physical Exam  There were no vitals taken for this visit. Wt Readings from Last 3 Encounters:  12/01/18 (!) 301 lb (136.5 kg)  11/24/18 (!) 301 lb (136.5 kg)  11/19/18 (!) 301 lb (136.5 kg)   He was advised to continue weight loss regimen, decrease caloric intake.  Health Maintenance Due  Topic Date Due  . HIV Screening  07/09/1985  . TETANUS/TDAP  07/09/1989    There are no preventive care reminders to display for this patient.  Lab Results  Component Value Date   TSH 3.650 09/10/2018   Lab Results  Component Value Date   WBC 11.0 (H) 11/20/2018   HGB 17.0 11/20/2018   HCT 53.0 (H) 11/20/2018   MCV 97.8 11/20/2018   PLT 185 11/20/2018   CBC will be rechecked in 2 weeks.  Lab Results  Component Value Date   NA 139 11/19/2018   K 4.6 11/19/2018   CO2 30 11/19/2018   GLUCOSE 120 (H) 11/19/2018   BUN 15 11/19/2018   CREATININE 0.77 11/19/2018   BILITOT 0.2 09/10/2018   ALKPHOS 160 (H) 09/10/2018   AST 17 09/10/2018   ALT 13 09/10/2018   PROT 7.1 09/10/2018   ALBUMIN 3.6 09/10/2018   CALCIUM 8.7 (L) 11/19/2018   ANIONGAP 8 11/19/2018     Lab Results  Component Value Date   CHOL 155 09/10/2018   Lab Results  Component Value Date   HDL 38 (L) 09/10/2018   Lab Results  Component Value Date   LDLCALC 84 09/10/2018   Lab Results  Component Value Date   TRIG 163 (H) 09/10/2018   Lab Results  Component Value Date   CHOLHDL 4.1 09/10/2018   He was advised to continue on low fat low cholesterol  diet, exercise 30 minutes daily and lose weight.  Lab Results  Component Value Date   HGBA1C 5.9 (H) 09/10/2018   He was advised to continue on low carb diet.   Assessment & Plan:    1. Localized edema - He will continue on current treatment regimen, potassium will be checked in 2 weeks. - He was advised to elevate legs  while sitting down and  Wear compression stockings. - furosemide (LASIX) 20 MG tablet; Take 1 tablet (20 mg total) by mouth daily.  Dispense: 30 tablet; Refill: 1  2. Health care maintenance - Following labs will be rechecked. - CBC w/Diff; Future - Comp Met (CMET); Future  3. Morbid obesity ([place]) . - Eat more salad without dressing or with low fat [place] dressing . Avoid and juices, or sodas or any regular sweets/cakes/candies . Decrease the amount of bread, rice, potatoes, pasta or similar carbohydrates rich food . Small portion of food . Nothing fried . Grilled or baked chicken, fish or [place] can be used, avoid red meat . Walk regularly at least 30 minutes daily   4. Anxiety - He will follow up with Ms. Simpson H.   Follow-up: Return in about 3 weeks (around 12/24/2018), or if symptoms worsen or fail to improve.    Westyn Keatley Jerold Coombe, NP

## 2018-12-04 ENCOUNTER — Ambulatory Visit: Payer: Self-pay | Admitting: Licensed Clinical Social Worker

## 2018-12-04 ENCOUNTER — Other Ambulatory Visit: Payer: Self-pay

## 2018-12-04 DIAGNOSIS — F331 Major depressive disorder, recurrent, moderate: Secondary | ICD-10-CM

## 2018-12-04 DIAGNOSIS — F411 Generalized anxiety disorder: Secondary | ICD-10-CM
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
## 2018-12-11 ENCOUNTER — Other Ambulatory Visit: Payer: Self-pay

## 2018-12-11 ENCOUNTER — Ambulatory Visit: Payer: Self-pay | Admitting: Licensed Clinical Social Worker

## 2018-12-11 DIAGNOSIS — F411 Generalized anxiety disorder: Secondary | ICD-10-CM

## 2018-12-11 DIAGNOSIS — F331 Major depressive disorder, recurrent, moderate: Secondary | ICD-10-CM

## 2018-12-11 MED ORDER — ESCITALOPRAM OXALATE 10 MG PO TABS: 10.0000 mg | ORAL_TABLET | Freq: Every day | ORAL | 0 refills | Status: DC
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
## 2018-12-18 ENCOUNTER — Ambulatory Visit: Payer: Self-pay | Admitting: Licensed Clinical Social Worker

## 2018-12-18 ENCOUNTER — Other Ambulatory Visit: Payer: Self-pay

## 2018-12-18 DIAGNOSIS — F331 Major depressive disorder, recurrent, moderate: Secondary | ICD-10-CM

## 2018-12-18 DIAGNOSIS — F411 Generalized anxiety disorder: Secondary | ICD-10-CM

## 2018-12-18 NOTE — BH Specialist Note (Signed)
Integrated Behavioral Health Follow Up Phone Visit  MRN: [id number] Name: Matthew [name]  Number of Integrated Behavioral Health Clinician visits: 2/6  Type of Service: Integrated Behavioral Health- Individual/Family Interpretor:No. Interpretor Name and Language: Not applicable.   SUBJECTIVE: Matthew [name] is a 48 y.o. male accompanied by himself. Patient was referred by Hurman Horn NP at [facility] for mental health. Patient reports the following symptoms/concerns: He reports that since he started taking the medicine this past Friday that he has noticed a huge improvement in his overall symptoms of anxiety and depression. He describes having problems with his bowels in the morning and at night. He asked if the issues with his bowels are because of the Lexapro. He notes that he has been able to sleep at night. He reports that he is being a little bit more physically active around the house. He denies any incidents or arguments with his family. He denies suicidal and homicidal thoughts.  Duration of problem: ; Severity of problem: mild  OBJECTIVE: Mood: Euthymic and Affect: not able to assess due to visit over the phone. Risk of harm to self or others: No plan to harm self or others  LIFE CONTEXT: Family and Social: See above. School/Work: Matthew [name] would like to work but his criminal background is a deterrent. Self-Care: Matthew [name] expressed the desire to loose some weight. Life Changes: see above.  GOALS ADDRESSED: Patient will: 1.  Reduce symptoms of: stress  2.  Increase knowledge and/or ability of: coping skills, healthy habits and self-management skills  3.  Demonstrate ability to: Increase healthy adjustment to current life circumstances  INTERVENTIONS: Interventions utilized:  Brief CBT was utilized by the clinician focusing on his stress and affect on normal cognition. Clinician processed with the patient regarding how he has been doing since the last  follow up session. Clinician explained to the patient that the symptoms he is experiencing are side effects of Lexapro but hopefully those will go away within the next week or so. Clinician encouraged the patient to continue to stay busy and physically active. Clinician explained to the patient that in order to loose weight that its a combination of diet and exercise. Clinician explained that hopefully once the pandemic is over that he will be able to find a job despite his background check.  Standardized Assessments completed: GAD-7 and PHQ 9  ASSESSMENT: Patient currently experiencing symptoms of stress due to not being able to work because the pandemic .   Patient may benefit from continuing with therapy and staying physically active .  PLAN: 1. Follow up with behavioral health clinician on : one week or earlier if needed. 2. Behavioral recommendations: see above. 3. Referral(s): Integrated [facility] (In Clinic) 4. "From scale of 1-10, how likely are you to follow plan?": 9  Matthew Forts, LCSW

## 2018-12-23 ENCOUNTER — Ambulatory Visit (INDEPENDENT_AMBULATORY_CARE_PROVIDER_SITE_OTHER): Payer: Self-pay | Admitting: Vascular Surgery

## 2018-12-25 ENCOUNTER — Other Ambulatory Visit: Payer: Self-pay

## 2018-12-25 ENCOUNTER — Ambulatory Visit: Payer: Self-pay | Admitting: Licensed Clinical Social Worker

## 2018-12-25 DIAGNOSIS — F411 Generalized anxiety disorder: Secondary | ICD-10-CM

## 2018-12-25 DIAGNOSIS — F331 Major depressive disorder, recurrent, moderate: Secondary | ICD-10-CM

## 2018-12-25 NOTE — BH Specialist Note (Signed)
Integrated Behavioral Health Follow Up Phone Visit  MRN: [id number] Name: Matthew [name]  Number of Integrated Behavioral Health Clinician visits: 3/6 Type of Service: Integrated Behavioral Health- Individual/Family Interpretor:No. Interpretor Name and Language: Not applicable.  SUBJECTIVE: Matthew [name] is a 48 y.o. male accompanied by himself. Patient was referred by Hurman Horn NP for mental health. Patient reports the following symptoms/concerns: He reports that his sister in law started acting out again so he and his wife decided to take the steps to place her into a group home. He reports that he took three pills of the Lexapro yesterday because he couldn't cope with it. He explains that other dealing with his sister in law that things have been going okay. He discussed his issues with the pandemic and wishing things would go back to some kind of normal again. He reports that the stomach issues and bathroom issues are no longer happening. He explains that he has been doing some walking long distance to loose weight. He denies suicidal and homicidal thoughts.  Duration of problem: ; Severity of problem: mild  OBJECTIVE: Mood: Euthymic and Affect: Appropriate Risk of harm to self or others: No plan to harm self or others  LIFE CONTEXT: Family and Social: see above. School/Work: Mr. Romaine is in the process of applying for disability.  Self-Care: see above. Life Changes: see above.  GOALS ADDRESSED: Patient will: 1.  Reduce symptoms of: anxiety  2.  Increase knowledge and/or ability of: coping skills and self-management skills  3.  Demonstrate ability to: Increase healthy adjustment to current life circumstances  INTERVENTIONS: Interventions utilized:  Brief CBT was utilized by the clinician focusing on the patient's anxiety and affect on normal cognition. Clinician processed with the patient regarding how he has been doing since the last session. Clinician discussed the  incident that occurred with his sister in law and explained that they are making the right decision to put her sister in law a group home. Clinician explained to the patient that she realizes that he was stressed out and why he took extra of the Lexapro but suggested in the future that he not do that again. Clinician suggested that the patient utilizing some of the anger management techniques that they have discussed at the last few sessions to be able to calm down and relax.  Standardized Assessments completed: Next week.  ASSESSMENT: Patient currently experiencing symptoms of anxiety due to issues with family.   Patient may benefit from continuing with therapy and coping skills.  PLAN: 1. Follow up with behavioral health clinician on : 1 week or earlier if needed. 2. Behavioral recommendations: see above. 3. Referral(s): Integrated [facility] (In Clinic) 4. "From scale of 1-10, how likely are you to follow plan?": 7  Althia Forts, LCSW

## 2019-01-01 ENCOUNTER — Ambulatory Visit: Payer: Self-pay | Admitting: Licensed Clinical Social Worker

## 2019-01-05 ENCOUNTER — Encounter (INDEPENDENT_AMBULATORY_CARE_PROVIDER_SITE_OTHER): Payer: Self-pay | Admitting: Vascular Surgery

## 2019-01-06 ENCOUNTER — Telehealth: Payer: Self-pay | Admitting: Licensed Clinical Social Worker

## 2019-01-06 NOTE — Telephone Encounter (Signed)
Clinician attempted to contact the patient to reschedule his cancelled appointment from last Wednesday at 12 pm. The voicemail message had someone else's name.

## 2019-01-08 ENCOUNTER — Ambulatory Visit: Payer: Self-pay | Admitting: Gerontology

## 2019-01-08 ENCOUNTER — Ambulatory Visit: Payer: Self-pay | Admitting: Licensed Clinical Social Worker

## 2019-01-08 ENCOUNTER — Other Ambulatory Visit: Payer: Self-pay

## 2019-01-08 ENCOUNTER — Encounter: Payer: Self-pay | Admitting: Gerontology

## 2019-01-08 DIAGNOSIS — Z0000 Encounter for general adult medical examination without abnormal findings: Secondary | ICD-10-CM

## 2019-01-08 DIAGNOSIS — I825Y2 Chronic embolism and thrombosis of unspecified deep veins of left proximal lower extremity: Secondary | ICD-10-CM

## 2019-01-08 DIAGNOSIS — F419 Anxiety disorder, unspecified: Secondary | ICD-10-CM

## 2019-01-08 DIAGNOSIS — F331 Major depressive disorder, recurrent, moderate: Secondary | ICD-10-CM

## 2019-01-08 DIAGNOSIS — F411 Generalized anxiety disorder: Secondary | ICD-10-CM

## 2019-01-08 MED ORDER — RIVAROXABAN 20 MG PO TABS: 20.0000 mg | ORAL_TABLET | Freq: Every day | ORAL | 2 refills | Status: DC

## 2019-01-08 NOTE — Progress Notes (Signed)
Established Patient Office Visit  Subjective:  Patient ID: Matthew [name], male    DOB: 07-15-1971  Age: 48 y.o. MRN: [id number]  CC:  Chief Complaint  Patient presents with  . Follow-up   Patient consents to telephone visit and 2 patient identifier was used to identify patient.  HPI Matthew [name] presents for follow up for DVT, Obesity and Anxiety. He reports that he continues to take 20 mg Rivaroxaban daily, denies hematuria, hematochezia, and claudication to left lower leg. He states that he has trace edema to left lower leg and elevating leg resolves edema. He continues to make lifestyle changes, and reports that he weighs 288 pounds. He states that he has some mild anxiety today due to having mechanical problem with the new scoter he bought one week ago. He denies suicidal or homicidal ideation, chest pain, palpitation, shortness of breath and no further concerns.  Past Medical History:  Diagnosis Date  . Hx of blood clots   . Sleep apnea     Past Surgical History:  Procedure Laterality Date  . IVC FILTER INSERTION Left 11/24/2018   Procedure: IVC FILTER INSERTION;  Surgeon: Algernon Huxley, MD;  Location: [facility];  Service: Cardiovascular;  Laterality: Left;  . IVC FILTER REMOVAL N/A 12/01/2018   Procedure: IVC FILTER REMOVAL;  Surgeon: Algernon Huxley, MD;  Location: [facility];  Service: Cardiovascular;  Laterality: N/A;  . NO PAST SURGERIES      History reviewed. No pertinent family history.  Social History   Socioeconomic History  . Marital status: Married    Spouse name: [name]  . Number of children: 4  . Years of education: Not on file  . Highest education level: Not on file  Occupational History  . Not on file  Social Needs  . Financial resource strain: Not hard at all  . Food insecurity:    Worry: Never true    Inability: Never true  . Transportation needs:    Medical: No    Non-medical: No  Tobacco Use  . Smoking status: Current  Every Day Smoker    Packs/day: 0.50    Types: Cigarettes  . Smokeless tobacco: Never Used  Substance and Sexual Activity  . Alcohol use: Yes    Comment: occassional  . Drug use: Never  . Sexual activity: Yes  Lifestyle  . Physical activity:    Days per week: Not on file    Minutes per session: Not on file  . Stress: Only a little  Relationships  . Social connections:    Talks on phone: More than three times a week    Gets together: Not on file    Attends religious service: Not on file    Active member of club or organization: Not on file    Attends meetings of clubs or organizations: Not on file    Relationship status: Not on file  . Intimate partner violence:    Fear of current or ex partner: No    Emotionally abused: No    Physically abused: No    Forced sexual activity: No  Other Topics Concern  . Not on file  Social History Narrative  . Not on file    Outpatient Medications Prior to Visit  Medication Sig Dispense Refill  . albuterol (ACCUNEB) 1.25 MG/3ML nebulizer solution Take 1 ampule by nebulization every 6 (six) hours as needed for wheezing.    Marland Kitchen escitalopram (LEXAPRO) 10 MG tablet Take 1 tablet (10 mg  total) by mouth daily for 30 days. 30 tablet 0  . furosemide (LASIX) 20 MG tablet Take 1 tablet (20 mg total) by mouth daily. 30 tablet 1  . oxyCODONE (OXY IR/ROXICODONE) 5 MG immediate release tablet Take 1 tablet (5 mg total) by mouth every 4 (four) hours as needed for moderate pain. 12 tablet 0  . rivaroxaban (XARELTO) 20 MG TABS tablet Take 1 tablet (20 mg total) by mouth daily with supper. 30 tablet 0  . Rivaroxaban (XARELTO) 15 MG TABS tablet Take 1 tablet (15 mg total) by mouth 2 (two) times daily with a meal for 20 days. 42 tablet 0   No facility-administered medications prior to visit.     No Known Allergies  ROS Review of Systems  Constitutional: Negative.   HENT: Negative.   Respiratory: Negative.   Cardiovascular: Leg swelling: trace edema to left  lower leg.  Genitourinary: Negative.   Neurological: Negative.   Hematological: Negative.   Psychiatric/Behavioral: The patient is nervous/anxious.       Objective:    Physical Exam   No vital sign or PE done.  There were no vitals taken for this visit. Wt Readings from Last 3 Encounters:  12/01/18 (!) 301 lb (136.5 kg)  11/24/18 (!) 301 lb (136.5 kg)  11/19/18 (!) 301 lb (136.5 kg)   Per patient he weighs 288 pounds.  Health Maintenance Due  Topic Date Due  . HIV Screening  07/09/1985  . TETANUS/TDAP  07/09/1989    There are no preventive care reminders to display for this patient.  Lab Results  Component Value Date   TSH 3.650 09/10/2018   Lab Results  Component Value Date   WBC 11.0 (H) 11/20/2018   HGB 17.0 11/20/2018   HCT 53.0 (H) 11/20/2018   MCV 97.8 11/20/2018   PLT 185 11/20/2018   Lab Results  Component Value Date   NA 139 11/19/2018   K 4.6 11/19/2018   CO2 30 11/19/2018   GLUCOSE 120 (H) 11/19/2018   BUN 15 11/19/2018   CREATININE 0.77 11/19/2018   BILITOT 0.2 09/10/2018   ALKPHOS 160 (H) 09/10/2018   AST 17 09/10/2018   ALT 13 09/10/2018   PROT 7.1 09/10/2018   ALBUMIN 3.6 09/10/2018   CALCIUM 8.7 (L) 11/19/2018   ANIONGAP 8 11/19/2018   Lab Results  Component Value Date   CHOL 155 09/10/2018   Lab Results  Component Value Date   HDL 38 (L) 09/10/2018   Lab Results  Component Value Date   LDLCALC 84 09/10/2018   Lab Results  Component Value Date   TRIG 163 (H) 09/10/2018   Lab Results  Component Value Date   CHOLHDL 4.1 09/10/2018   Lab Results  Component Value Date   HGBA1C 5.9 (H) 09/10/2018      Assessment & Plan:     1. Chronic deep vein thrombosis (DVT) of proximal vein of left lower extremity (HCC) - He will continue current treatment regimen and go to the emergency room for any signs of bleeding, worsening edema to lower extremities or claudication. - He was advised to elevate leg and wear compression  stockings. - rivaroxaban (XARELTO) 20 MG TABS tablet; Take 1 tablet (20 mg total) by mouth daily with supper.  Dispense: 30 tablet; Refill: 2  2. Anxiety - He will continue on current treatment regimen. - He will continue to follow up with Ms. Simpson for mental health counseling.  3. Morbid obesity ([place]) - He was encouraged to continue  on weight loss regimen  4. Health care maintenance - Routine labs will be rechecked. - Lipid panel; Future - CBC w/Diff; Future - Comp Met (CMET); Future   Follow-up: Return in about 2 months (around 03/10/2019), or if symptoms worsen or fail to improve.    Garima Chronis Jerold Coombe, NP

## 2019-01-08 NOTE — BH Specialist Note (Signed)
Integrated Behavioral Health Follow Up Phone Visit  MRN: [id number] Name: Matthew [name]  Type of Service: Integrated Behavioral Health- Individual/Family Interpretor:No. Interpretor Name and Language: Not applicable.  SUBJECTIVE: Matthew [name] is a 48 y.o. male accompanied by himself. Patient was referred by Matthew [name] for mental health. Patient reports the following symptoms/concerns: He apologized for missing his last follow up appointment. He explains that he ended up buying a scooter and has been enjoying riding it around town. He notes that he has seen an improvement in his symptoms of depression, anxiety, and agitation since starting the Lexapro. He reports one argument with the wife after he drank several beers and she wouldn't let him in the house one night. He denies suicidal and homicidal thoughts.  Duration of problem: ; Severity of problem: mild  OBJECTIVE: Mood: Euthymic and Affect: Appropriate Risk of harm to self or others: No plan to harm self or others  LIFE CONTEXT: Family and Social: see above. School/Work: Matthew [name] is waiting for the pandemic to subside so he can find a job. Self-Care: see above. Life Changes: see above.  GOALS ADDRESSED: Patient will: 1.  Reduce symptoms of: stress  2.  Increase knowledge and/or ability of: coping skills  3.  Demonstrate ability to: Increase healthy adjustment to current life circumstances  INTERVENTIONS: Interventions utilized:  Brief CBT was utilized by the clinician focusing on the patient's relapse prevention. Clinician processed with the patient regarding how he has been doing since the last follow up session. Clinician discussed with the patient regarding how things have been in terms of his anxiety and depression. Clinician suggested that the next time that the patient try drink more responsibly to prevent future arguments with his wife. Clinician suggested to the patient that due to progress that he can  follow up in one month instead of once a week. Standardized Assessments completed: GAD-7 and PHQ 9  ASSESSMENT: Patient currently experiencing see above.   Patient may benefit from see above.   PLAN: 1. Follow up with behavioral health clinician on : one month or earlier if needed. 2. Behavioral recommendations: see above. 3. Referral(s): Integrated [facility] (In Clinic) 4. "From scale of 1-10, how likely are you to follow plan?": 8  Matthew Forts, LCSW

## 2019-01-09 ENCOUNTER — Other Ambulatory Visit: Payer: Self-pay

## 2019-01-09 DIAGNOSIS — F419 Anxiety disorder, unspecified: Secondary | ICD-10-CM

## 2019-01-09 MED ORDER — ESCITALOPRAM OXALATE 10 MG PO TABS: 10.0000 mg | ORAL_TABLET | Freq: Every day | ORAL | 2 refills | Status: DC

## 2019-02-04 ENCOUNTER — Ambulatory Visit: Payer: Self-pay | Admitting: Gerontology

## 2019-02-04 ENCOUNTER — Other Ambulatory Visit: Payer: Self-pay

## 2019-02-04 DIAGNOSIS — M545 Low back pain, unspecified: Secondary | ICD-10-CM

## 2019-02-04 DIAGNOSIS — L237 Allergic contact dermatitis due to plants, except food: Secondary | ICD-10-CM

## 2019-02-04 MED ORDER — TRIAMCINOLONE ACETONIDE 0.1 % EX CREA: 1.0000 "application " | TOPICAL_CREAM | Freq: Two times a day (BID) | CUTANEOUS | 0 refills | Status: DC

## 2019-02-04 MED ORDER — DICLOFENAC SODIUM 1 % TD GEL: 2.0000 g | Freq: Four times a day (QID) | TRANSDERMAL | Status: DC

## 2019-02-04 NOTE — Progress Notes (Signed)
Established Patient Office Visit  Subjective:  Patient ID: Matthew [name], male    DOB: 10/19/1970  Age: 48 y.o. MRN: [id number]  CC:  Chief Complaint  Patient presents with  . Follow-up    Right back pain for 3 days  Patient consents to telephone visit and two patient identifiers was used to identify patient.  HPI Matthew [name] presents  for c/o constant 8/10 non radiating sharp and achy pain to right lower back that started 3 days ago when he was getting out of bed in the morning. He reports that it starts hurting when he's getting out of bed and continues through the day.He denies injury or trauma,fever, chills, dysuria, urinary frequency or urgency, flank pain, bowel or bladder incontinence, motor weakness and numbness. He reports that he has taking 1000 mg tylenol tid with minimal relief and denies aggravating factor. He also reports having contact dermatitis with exposure to poison ivy oak 4 days ago. He states that he has scattered, mild pruritic erythematous rash to his lower legs. He states that he has used otc cream with minimal relief.  He denies fever, chills and no further concerns.   Past Medical History:  Diagnosis Date  . Hx of blood clots   . Sleep apnea     Past Surgical History:  Procedure Laterality Date  . IVC FILTER INSERTION Left 11/24/2018   Procedure: IVC FILTER INSERTION;  Surgeon: Annice Needyew, Jason S, MD;  Location: [facility];  Service: Cardiovascular;  Laterality: Left;  . IVC FILTER REMOVAL N/A 12/01/2018   Procedure: IVC FILTER REMOVAL;  Surgeon: Annice Needyew, Jason S, MD;  Location: [facility];  Service: Cardiovascular;  Laterality: N/A;  . NO PAST SURGERIES      No family history on file.  Social History   Socioeconomic History  . Marital status: Married    Spouse name: [name]  . Number of children: 4  . Years of education: Not on file  . Highest education level: Not on file  Occupational History  . Not on file  Social Needs  .  Financial resource strain: Not hard at all  . Food insecurity:    Worry: Never true    Inability: Never true  . Transportation needs:    Medical: No    Non-medical: No  Tobacco Use  . Smoking status: Current Every Day Smoker    Packs/day: 0.50    Types: Cigarettes  . Smokeless tobacco: Never Used  Substance and Sexual Activity  . Alcohol use: Yes    Comment: occassional  . Drug use: Never  . Sexual activity: Yes  Lifestyle  . Physical activity:    Days per week: Not on file    Minutes per session: Not on file  . Stress: Only a little  Relationships  . Social connections:    Talks on phone: More than three times a week    Gets together: Not on file    Attends religious service: Not on file    Active member of club or organization: Not on file    Attends meetings of clubs or organizations: Not on file    Relationship status: Not on file  . Intimate partner violence:    Fear of current or ex partner: No    Emotionally abused: No    Physically abused: No    Forced sexual activity: No  Other Topics Concern  . Not on file  Social History Narrative  . Not on file  Outpatient Medications Prior to Visit  Medication Sig Dispense Refill  . escitalopram (LEXAPRO) 10 MG tablet Take 1 tablet (10 mg total) by mouth daily for 30 days. 30 tablet 2  . furosemide (LASIX) 20 MG tablet Take 1 tablet (20 mg total) by mouth daily. 30 tablet 1  . rivaroxaban (XARELTO) 20 MG TABS tablet Take 1 tablet (20 mg total) by mouth daily with supper. 30 tablet 2  . albuterol (ACCUNEB) 1.25 MG/3ML nebulizer solution Take 1 ampule by nebulization every 6 (six) hours as needed for wheezing.     No facility-administered medications prior to visit.     No Known Allergies  ROS Review of Systems  Constitutional: Negative.   Respiratory: Negative.   Cardiovascular: Negative.   Gastrointestinal: Negative.   Genitourinary: Negative.   Musculoskeletal: Positive for back pain (acute right lower  back).  Skin: Positive for rash (contact dermatitis s/p poison ivy oak exposure).  Psychiatric/Behavioral: Negative.       Objective:    Physical Exam No vital or PE done There were no vitals taken for this visit. Wt Readings from Last 3 Encounters:  12/01/18 (!) 301 lb (136.5 kg)  11/24/18 (!) 301 lb (136.5 kg)  11/19/18 (!) 301 lb (136.5 kg)     Health Maintenance Due  Topic Date Due  . HIV Screening  07/09/1985  . TETANUS/TDAP  07/09/1989    There are no preventive care reminders to display for this patient.  Lab Results  Component Value Date   TSH 3.650 09/10/2018   Lab Results  Component Value Date   WBC 11.0 (H) 11/20/2018   HGB 17.0 11/20/2018   HCT 53.0 (H) 11/20/2018   MCV 97.8 11/20/2018   PLT 185 11/20/2018   Lab Results  Component Value Date   NA 139 11/19/2018   K 4.6 11/19/2018   CO2 30 11/19/2018   GLUCOSE 120 (H) 11/19/2018   BUN 15 11/19/2018   CREATININE 0.77 11/19/2018   BILITOT 0.2 09/10/2018   ALKPHOS 160 (H) 09/10/2018   AST 17 09/10/2018   ALT 13 09/10/2018   PROT 7.1 09/10/2018   ALBUMIN 3.6 09/10/2018   CALCIUM 8.7 (L) 11/19/2018   ANIONGAP 8 11/19/2018   Lab Results  Component Value Date   CHOL 155 09/10/2018   Lab Results  Component Value Date   HDL 38 (L) 09/10/2018   Lab Results  Component Value Date   LDLCALC 84 09/10/2018   Lab Results  Component Value Date   TRIG 163 (H) 09/10/2018   Lab Results  Component Value Date   CHOLHDL 4.1 09/10/2018   Lab Results  Component Value Date   HGBA1C 5.9 (H) 09/10/2018      Assessment & Plan:     1. Acute right-sided low back pain without sciatica Pyelonephritis? Muscle Strain? - diclofenac sodium (VOLTAREN) 1 % transdermal gel 2 g - CBC w/Diff; Future - Urinalysis; Future - UA/M w/rflx Culture, Routine; Future - Sedimentation rate; Future - He was advised to use otc tylenol 1000 mg every 8 hours, and go to the ED with worsening symptoms.  2. Poison ivy  dermatitis - triamcinolone cream (KENALOG) 0.1 %; Apply 1 application topically 2 (two) times daily.  Dispense: 30 g; Refill: 0   Follow-up: Return in about 1 week (around 02/11/2019), or if symptoms worsen or fail to improve.    Sherronda Sweigert Trellis Paganini, NP

## 2019-02-05 ENCOUNTER — Other Ambulatory Visit: Payer: Self-pay

## 2019-02-05 ENCOUNTER — Ambulatory Visit: Payer: Self-pay | Admitting: Licensed Clinical Social Worker

## 2019-02-05 DIAGNOSIS — N390 Urinary tract infection, site not specified: Secondary | ICD-10-CM

## 2019-02-06 ENCOUNTER — Other Ambulatory Visit: Payer: Self-pay | Admitting: Gerontology

## 2019-02-06 DIAGNOSIS — M545 Low back pain, unspecified: Secondary | ICD-10-CM

## 2019-02-08 LAB — MICROSCOPIC EXAMINATION: Casts: NONE SEEN /lpf

## 2019-02-08 LAB — UA/M W/RFLX CULTURE, ROUTINE
Bilirubin, UA: NEGATIVE
Glucose, UA: NEGATIVE
Ketones, UA: NEGATIVE
Nitrite, UA: NEGATIVE
Protein,UA: NEGATIVE
RBC, UA: NEGATIVE
Specific Gravity, UA: 1.018 (ref 1.005–1.030)
Urobilinogen, Ur: 1 mg/dL (ref 0.2–1.0)
pH, UA: 6.5 (ref 5.0–7.5)

## 2019-02-08 LAB — URINE CULTURE, REFLEX

## 2019-02-10 ENCOUNTER — Other Ambulatory Visit: Payer: Self-pay | Admitting: Gerontology

## 2019-02-10 ENCOUNTER — Ambulatory Visit: Payer: Self-pay | Admitting: Licensed Clinical Social Worker

## 2019-02-10 ENCOUNTER — Other Ambulatory Visit: Payer: Self-pay

## 2019-02-10 DIAGNOSIS — F411 Generalized anxiety disorder: Secondary | ICD-10-CM

## 2019-02-10 DIAGNOSIS — N390 Urinary tract infection, site not specified: Secondary | ICD-10-CM

## 2019-02-10 DIAGNOSIS — F331 Major depressive disorder, recurrent, moderate: Secondary | ICD-10-CM

## 2019-02-10 MED ORDER — CIPROFLOXACIN HCL 500 MG PO TABS: 500.0000 mg | ORAL_TABLET | Freq: Two times a day (BID) | ORAL | 0 refills | Status: DC

## 2019-02-10 NOTE — BH Specialist Note (Signed)
Integrated Behavioral Health Follow Up Visit Via Phone  MRN: [id number] Name: Matthew [name]  Type of Service: [place] Interpretor:No. Interpretor Name and Language:  N/a.  SUBJECTIVE: Matthew [name] is a 48 y.o. male accompanied by himself. Patient was referred by Carlyon Shadow NP for mental health.  Patient reports the following symptoms/concerns: He reports that he forgot about his last appointment and his phone isn't working right now. He explains that things are less stressful at home due to his sister in law moving into a group home. He notes that he is working with a temp agency to find employment and is hoping to find something soon. 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 He notes that he has been having some arguments with his wife over him staying out late with his friends or being gone a lot. He notes that when he does drink that he will not take his medicine that day. He explains that some night that he can sleep well and others, he has a hard time falling asleep. He denies suicidal and homicidal thoughts.  Duration of problem: ; Severity of problem: mild  OBJECTIVE: Mood: Euthymic and Affect: Appropriate Risk of harm to self or others: No plan to harm self or others  LIFE CONTEXT: Family and Social: see above. School/Work: see above. Self-Care: see above. Life Changes: see above.  GOALS ADDRESSED: Patient will: 1.  Reduce symptoms of: insomnia  2.  Increase knowledge and/or ability of: coping skills and healthy habits  3.  Demonstrate ability to: Increase healthy adjustment to current life circumstances  INTERVENTIONS: Interventions utilized:  Brief CBT was utilized by the clinician focusing on the patient's stress. Clinician processed with the patient regarding how things have been going since the last follow up session. Clinician  explained to the patient that it sounds like he is in better spirits because he is working with a temp agency to find employment and has his scooter to get around places without depending on anyone. Clinician encouraged the patient to be responsible when he does drink by not getting behind the wheel of his scooter. Clinician suggested that the patient learn how to balance spending time with his wife, his friends, and mowing yards.  Standardized Assessments completed: GAD-7 and PHQ 9  ASSESSMENT: Patient currently experiencing see above.   Patient may benefit from see above.  PLAN: 1. Follow up with behavioral health clinician on : one month or earlier if needed. 2. Behavioral recommendations: see above. 3. Referral(s): [facility] (In Clinic) 4. "From scale of 1-10, how likely are you to follow plan?":   Bayard Hugger, LCSW

## 2019-02-17 ENCOUNTER — Encounter: Payer: Self-pay | Admitting: Emergency Medicine

## 2019-02-17 ENCOUNTER — Emergency Department: Payer: Self-pay

## 2019-02-17 ENCOUNTER — Other Ambulatory Visit (INDEPENDENT_AMBULATORY_CARE_PROVIDER_SITE_OTHER): Payer: Self-pay | Admitting: Vascular Surgery

## 2019-02-17 ENCOUNTER — Other Ambulatory Visit: Payer: Self-pay

## 2019-02-17 ENCOUNTER — Inpatient Hospital Stay
Admission: EM | Admit: 2019-02-17 | Discharge: 2019-02-19 | DRG: 253 | Disposition: A | Discharge status: Home / Self Care | Payer: Self-pay | Attending: Internal Medicine | Admitting: Internal Medicine

## 2019-02-17 DIAGNOSIS — G4733 Obstructive sleep apnea (adult) (pediatric): Secondary | ICD-10-CM | POA: Diagnosis present

## 2019-02-17 DIAGNOSIS — I82431 Acute embolism and thrombosis of right popliteal vein: Principal | ICD-10-CM | POA: Diagnosis present

## 2019-02-17 DIAGNOSIS — Z7901 Long term (current) use of anticoagulants: Secondary | ICD-10-CM

## 2019-02-17 DIAGNOSIS — Z6841 Body Mass Index (BMI) 40.0 and over, adult: Secondary | ICD-10-CM

## 2019-02-17 DIAGNOSIS — I2782 Chronic pulmonary embolism: Secondary | ICD-10-CM | POA: Diagnosis present

## 2019-02-17 DIAGNOSIS — F17210 Nicotine dependence, cigarettes, uncomplicated: Secondary | ICD-10-CM | POA: Diagnosis present

## 2019-02-17 DIAGNOSIS — Z452 Encounter for adjustment and management of vascular access device: Secondary | ICD-10-CM

## 2019-02-17 DIAGNOSIS — Z86718 Personal history of other venous thrombosis and embolism: Secondary | ICD-10-CM

## 2019-02-17 DIAGNOSIS — Z20828 Contact with and (suspected) exposure to other viral communicable diseases: Secondary | ICD-10-CM | POA: Diagnosis present

## 2019-02-17 DIAGNOSIS — I82451 Acute embolism and thrombosis of right peroneal vein: Secondary | ICD-10-CM

## 2019-02-17 DIAGNOSIS — E669 Obesity, unspecified: Secondary | ICD-10-CM

## 2019-02-17 DIAGNOSIS — Z79899 Other long term (current) drug therapy: Secondary | ICD-10-CM

## 2019-02-17 DIAGNOSIS — F329 Major depressive disorder, single episode, unspecified: Secondary | ICD-10-CM | POA: Diagnosis present

## 2019-02-17 DIAGNOSIS — I82409 Acute embolism and thrombosis of unspecified deep veins of unspecified lower extremity: Secondary | ICD-10-CM | POA: Diagnosis present

## 2019-02-17 DIAGNOSIS — Z9119 Patient's noncompliance with other medical treatment and regimen: Secondary | ICD-10-CM

## 2019-02-17 LAB — BASIC METABOLIC PANEL
Anion gap: 9 (ref 5–15)
BUN: 7 mg/dL (ref 6–20)
CO2: 32 mmol/L (ref 22–32)
Calcium: 9.2 mg/dL (ref 8.9–10.3)
Chloride: 99 mmol/L (ref 98–111)
Creatinine, Ser: 0.73 mg/dL (ref 0.61–1.24)
GFR calc Af Amer: >60 mL/min (ref >60)
GFR calc non Af Amer: >60 mL/min (ref >60)
Glucose, Bld: 103 mg/dL — ABNORMAL HIGH (ref 70–99)
Potassium: 4.2 mmol/L (ref 3.5–5.1)
Sodium: 140 mmol/L (ref 135–145)

## 2019-02-17 LAB — CBC
HCT: 55.2 % — ABNORMAL HIGH (ref 39.0–52.0)
Hemoglobin: 18.4 g/dL — ABNORMAL HIGH (ref 13.0–17.0)
MCH: 33.1 pg (ref 26.0–34.0)
MCHC: 33.3 g/dL (ref 30.0–36.0)
MCV: 99.3 fL (ref 80.0–100.0)
Platelets: 157 10*3/uL (ref 150–400)
RBC: 5.56 MIL/uL (ref 4.22–5.81)
RDW: 15.3 % (ref 11.5–15.5)
WBC: 10.3 10*3/uL (ref 4.0–10.5)
nRBC: 0 % (ref 0.0–0.2)

## 2019-02-17 LAB — APTT: aPTT: 26 seconds (ref 24–36)

## 2019-02-17 LAB — SARS CORONAVIRUS 2 BY RT PCR (HOSPITAL ORDER, PERFORMED IN ~~LOC~~ HOSPITAL LAB): SARS Coronavirus 2: NEGATIVE

## 2019-02-17 LAB — PROTIME-INR
INR: 1.1 (ref 0.8–1.2)
Prothrombin Time: 14.3 seconds (ref 11.4–15.2)

## 2019-02-17 LAB — HEPARIN LEVEL (UNFRACTIONATED): Heparin Unfractionated: <0.1 IU/mL — ABNORMAL LOW (ref 0.30–0.70)

## 2019-02-17 MED ORDER — IOHEXOL 350 MG/ML SOLN
75.0000 mL | Freq: Once | INTRAVENOUS | Status: AC | PRN
  Administered 2019-02-17: 17:00:00 75 mL via INTRAVENOUS

## 2019-02-17 MED ORDER — ONDANSETRON HCL 4 MG/2ML IJ SOLN
4.0000 mg | Freq: Once | INTRAMUSCULAR | Status: AC
  Administered 2019-02-17: 4 mg via INTRAVENOUS
  Filled 2019-02-17: qty 2

## 2019-02-17 MED ORDER — HEPARIN SODIUM (PORCINE) 5000 UNIT/ML IJ SOLN
2000.0000 [IU] | Freq: Once | INTRAMUSCULAR | Status: AC
  Administered 2019-02-17: 2000 [IU] via INTRAVENOUS
  Filled 2019-02-17: qty 1

## 2019-02-17 MED ORDER — ESCITALOPRAM OXALATE 10 MG PO TABS
10.0000 mg | ORAL_TABLET | Freq: Every day | ORAL | Status: DC
  Filled 2019-02-17 (×3): qty 1

## 2019-02-17 MED ORDER — FUROSEMIDE 20 MG PO TABS: 20.0000 mg | ORAL_TABLET | Freq: Every day | ORAL | Status: DC

## 2019-02-17 MED ORDER — POLYETHYLENE GLYCOL 3350 17 G PO PACK: 17.0000 g | PACK | Freq: Every day | ORAL | Status: DC | PRN

## 2019-02-17 MED ORDER — ACETAMINOPHEN 325 MG PO TABS
650.0000 mg | ORAL_TABLET | Freq: Four times a day (QID) | ORAL | Status: DC | PRN
  Administered 2019-02-19: 650 mg via ORAL
  Filled 2019-02-17: qty 2

## 2019-02-17 MED ORDER — MORPHINE SULFATE (PF) 4 MG/ML IV SOLN
4.0000 mg | Freq: Once | INTRAVENOUS | Status: AC
  Administered 2019-02-17: 4 mg via INTRAVENOUS
  Filled 2019-02-17: qty 1

## 2019-02-17 MED ORDER — SODIUM CHLORIDE 0.9 % IV SOLN
INTRAVENOUS | Status: DC
  Administered 2019-02-17 – 2019-02-19 (×3): via INTRAVENOUS

## 2019-02-17 MED ORDER — KETOROLAC TROMETHAMINE 30 MG/ML IJ SOLN
30.0000 mg | Freq: Four times a day (QID) | INTRAMUSCULAR | Status: DC | PRN
  Administered 2019-02-17 – 2019-02-18 (×2): 30 mg via INTRAVENOUS
  Filled 2019-02-17 (×2): qty 1

## 2019-02-17 MED ORDER — IPRATROPIUM-ALBUTEROL 0.5-2.5 (3) MG/3ML IN SOLN
3.0000 mL | Freq: Once | RESPIRATORY_TRACT | Status: AC
  Administered 2019-02-17: 3 mL via RESPIRATORY_TRACT
  Filled 2019-02-17: qty 3

## 2019-02-17 MED ORDER — MUPIROCIN 2 % EX OINT
1.0000 "application " | TOPICAL_OINTMENT | Freq: Two times a day (BID) | CUTANEOUS | Status: DC
  Administered 2019-02-18: 1 via NASAL
  Filled 2019-02-17: qty 22

## 2019-02-17 MED ORDER — IBUPROFEN 400 MG PO TABS: 400.0000 mg | ORAL_TABLET | Freq: Four times a day (QID) | ORAL | Status: DC | PRN

## 2019-02-17 MED ORDER — ALBUTEROL SULFATE (2.5 MG/3ML) 0.083% IN NEBU: 1.5000 mL | INHALATION_SOLUTION | Freq: Four times a day (QID) | RESPIRATORY_TRACT | Status: DC | PRN

## 2019-02-17 MED ORDER — HEPARIN (PORCINE) 25000 UT/250ML-% IV SOLN
2300.0000 [IU]/h | INTRAVENOUS | Status: DC
  Administered 2019-02-17: 1700 [IU]/h via INTRAVENOUS
  Administered 2019-02-18 (×2): 2100 [IU]/h via INTRAVENOUS
  Administered 2019-02-19: 2300 [IU]/h via INTRAVENOUS
  Filled 2019-02-17 (×4): qty 250

## 2019-02-17 MED ORDER — ACETAMINOPHEN 650 MG RE SUPP: 650.0000 mg | Freq: Four times a day (QID) | RECTAL | Status: DC | PRN

## 2019-02-17 NOTE — Consult Note (Signed)
ANTICOAGULATION CONSULT NOTE - Initial Consult  Pharmacy Consult for Heparin Drip Indication: DVT  No Known Allergies  Patient Measurements: Weight: 286 lb (129.7 kg) Heparin Dosing Weight: 101.7kg  Vital Signs: Temp: 98.2 F (36.8 C) (06/16 1258) Temp Source: Oral (06/16 1258) BP: 116/59 (06/16 1730) Pulse Rate: 90 (06/16 1745)  Labs: Recent Labs    02/17/19 1304  HGB 18.4*  HCT 55.2*  PLT 157  CREATININE 0.73    Estimated Creatinine Clearance: 146.1 mL/min (by C-G formula based on SCr of 0.73 mg/dL).   Medical History: Past Medical History:  Diagnosis Date  . Hx of blood clots   . Sleep apnea     Medications:  Home anticoagulant Xarelto 20mg  qd with supper - last dose 6/15 @ 1800  Assessment: Pt was on heparin drip on a previous admission 3/18 and mildly subtherapeutic at a rate of 1600 units/hr.  Heparin dosing weight has increased since last admission by ~ 2-3 kg.  For these reasons, will initiate drip at a rate of 1700 units/hr.  Given timeline of pt last dose of Xarelto, will give a moderate bolus of 2000 units.   Goal of Therapy:  Heparin level 0.3-0.7 units/ml (APTT 66-102sec) Monitor platelets by anticoagulation protocol: Yes   Plan:  Will obtain baseline INR, Heparin level, CBC, and APTT.  Will check APTT and heparin level in 6 hours per protocol and dose based on APTT until HL and APTT correlate.  Lu Duffel, PharmD, BCPS Clinical Pharmacist 02/17/2019 6:09 PM

## 2019-02-17 NOTE — Consult Note (Signed)
[facility] Vascular Consult Note  MRN : [id number]  Matthew CrankerKelly Finklea is a 48 y.o. (05/01/1971) male who presents with chief complaint of  Chief Complaint  Patient presents with  . Leg Pain   History of Present Illness:  The patient is a 48 year old male with a past medical history of sleep apnea, current everyday tobacco abuse, obesity, history of left lower extremity DVT status post venous lysis and IVC filter insertion and subsequent removal on Xarelto who presents to the [facility]'s emergency department with a chief complaint impressively worsening right leg pain.  Patient endorses a history of progressively worsening right leg pain which started approximately 1 day ago.  Patient notes the pain was acute in nature located mostly to the right calf.  He denies any radiation of the pain.  He denies any recent trauma or surgery to the right lower extremity.  Denies any prolonged illness.  The patient states that he has been taking his Xarelto on a daily basis.  During his work-up in the emergency department he was found to have an "occlusive thrombus noted in the popliteal vein and peroneal veins."  Discussed surgery was consulted by Dr. Darnelle CatalanMalinda for further recommendations. Current Facility-Administered Medications  Medication Dose Route Frequency Provider Last Rate Last Dose  . [START ON 02/18/2019] 0.9 %  sodium chloride infusion   Intravenous Continuous Jacolyn Joaquin A, PA-C      . diclofenac sodium (VOLTAREN) 1 % transdermal gel 2 g  2 g Topical QID Iloabachie, Chioma E, NP      . ipratropium-albuterol (DUONEB) 0.5-2.5 (3) MG/3ML nebulizer solution 3 mL  3 mL Nebulization Once Arnaldo NatalMalinda, Paul F, MD       Current Outpatient Medications  Medication Sig Dispense Refill  . albuterol (ACCUNEB) 1.25 MG/3ML nebulizer solution Take 1 ampule by nebulization every 6 (six) hours as needed for wheezing.    . ciprofloxacin (CIPRO) 500 MG tablet Take  1 tablet (500 mg total) by mouth 2 (two) times daily. 14 tablet 0  . escitalopram (LEXAPRO) 10 MG tablet Take 1 tablet (10 mg total) by mouth daily for 30 days. 30 tablet 2  . furosemide (LASIX) 20 MG tablet Take 1 tablet (20 mg total) by mouth daily. 30 tablet 1  . rivaroxaban (XARELTO) 20 MG TABS tablet Take 1 tablet (20 mg total) by mouth daily with supper. 30 tablet 2  . triamcinolone cream (KENALOG) 0.1 % Apply 1 application topically 2 (two) times daily. 30 g 0   Past Medical History:  Diagnosis Date  . Hx of blood clots   . Sleep apnea    Past Surgical History:  Procedure Laterality Date  . IVC FILTER INSERTION Left 11/24/2018   Procedure: IVC FILTER INSERTION;  Surgeon: Annice Needyew, Jason S, MD;  Location: [facility] INVASIVE CV LAB;  Service: Cardiovascular;  Laterality: Left;  . IVC FILTER REMOVAL N/A 12/01/2018   Procedure: IVC FILTER REMOVAL;  Surgeon: Annice Needyew, Jason S, MD;  Location: [facility] INVASIVE CV LAB;  Service: Cardiovascular;  Laterality: N/A;  . NO PAST SURGERIES     Social History Social History   Tobacco Use  . Smoking status: Current Every Day Smoker    Packs/day: 0.50    Types: Cigarettes  . Smokeless tobacco: Never Used  Substance Use Topics  . Alcohol use: Yes    Comment: occassional  . Drug use: Never   Family History No family history on file.  Denies family history of peripheral artery disease, venous  disease, and/or bleeding/clotting disorders.  No Known Allergies  REVIEW OF SYSTEMS (Negative unless checked)  Constitutional: [] Weight loss  [] Fever  [] Chills Cardiac: [] Chest pain   [] Chest pressure   [] Palpitations   [] Shortness of breath when laying flat   [] Shortness of breath at rest   [] Shortness of breath with exertion. Vascular:  [x] Pain in legs with walking   [x] Pain in legs at rest   [x] Pain in legs when laying flat   [] Claudication   [] Pain in feet when walking  [] Pain in feet at rest  [] Pain in feet when laying flat   [x] History of DVT   [] Phlebitis    [x] Swelling in legs   [] Varicose veins   [] Non-healing ulcers Pulmonary:   [] Uses home oxygen   [] Productive cough   [] Hemoptysis   [] Wheeze  [] COPD   [] Asthma Neurologic:  [] Dizziness  [] Blackouts   [] Seizures   [] History of stroke   [] History of TIA  [] Aphasia   [] Temporary blindness   [] Dysphagia   [] Weakness or numbness in arms   [] Weakness or numbness in legs Musculoskeletal:  [] Arthritis   [] Joint swelling   [] Joint pain   [] Low back pain Hematologic:  [] Easy bruising  [] Easy bleeding   [x] Hypercoagulable state   [] Anemic  [] Hepatitis Gastrointestinal:  [] Blood in stool   [] Vomiting blood  [] Gastroesophageal reflux/heartburn   [] Difficulty swallowing. Genitourinary:  [] Chronic kidney disease   [] Difficult urination  [] Frequent urination  [] Burning with urination   [] Blood in urine Skin:  [] Rashes   [] Ulcers   [] Wounds Psychological:  [] History of anxiety   []  History of major depression.  Physical Examination  Vitals:   02/17/19 1258 02/17/19 1554  BP: 121/76 125/79  Pulse: 77 77  Resp: 18 17  Temp: 98.2 F (36.8 C)   TempSrc: Oral   SpO2: 97% 94%   There is no height or weight on file to calculate BMI. Gen:  WD/WN, NAD Head: /AT, No temporalis wasting. Prominent temp pulse not noted. Ear/Nose/Throat: Hearing grossly intact, nares w/o erythema or drainage, oropharynx w/o Erythema/Exudate Eyes: Sclera non-icteric, conjunctiva clear Neck: Trachea midline.  No JVD.  Pulmonary:  Good air movement, respirations not labored, equal bilaterally.  Cardiac: RRR, normal S1, S2. Vascular:  Vessel Right Left  Radial Palpable Palpable  Ulnar Palpable Palpable  Brachial Palpable Palpable  Carotid Palpable, without bruit Palpable, without bruit  Aorta Not palpable N/A  Femoral Palpable Palpable  Popliteal Palpable Palpable  PT Palpable Palpable  DP Palpable Palpable   Right Lower extremity: Soft.  Calf soft.  Calf is tender to palpation.  There is tenderness with dorsiflexion.  Hard  to palpate pedal pulses due to body habitus and edema however the extremity is warm down to the toes.  There is good capillary refill.  Stasis dermatitis is noted.  No ulcerations are noted.  Gastrointestinal: soft, non-tender/non-distended. No guarding/reflex.  Musculoskeletal: M/S 5/5 throughout.  Extremities without ischemic changes.  No deformity or atrophy. Moderate edema bilaterally. Neurologic: Sensation grossly intact in extremities.  Symmetrical.  Speech is fluent. Motor exam as listed above. Psychiatric: Judgment intact, Mood & affect appropriate for pt's clinical situation. Dermatologic: No rashes or ulcers noted.  No cellulitis or open wounds.  Moderate stasis dermatitis is noted bilaterally. Lymph : No Cervical, Axillary, or Inguinal lymphadenopathy.  CBC Lab Results  Component Value Date   WBC 10.3 02/17/2019   HGB 18.4 (H) 02/17/2019   HCT 55.2 (H) 02/17/2019   MCV 99.3 02/17/2019   PLT 157 02/17/2019  BMET    Component Value Date/Time   NA 140 02/17/2019 1304   NA 143 09/10/2018 0930   K 4.2 02/17/2019 1304   CL 99 02/17/2019 1304   CO2 32 02/17/2019 1304   GLUCOSE 103 (H) 02/17/2019 1304   BUN 7 02/17/2019 1304   BUN 8 09/10/2018 0930   CREATININE 0.73 02/17/2019 1304   CALCIUM 9.2 02/17/2019 1304   GFRNONAA >60 02/17/2019 1304   GFRAA >60 02/17/2019 1304   CrCl cannot be calculated (Unknown ideal weight.).  COAG Lab Results  Component Value Date   INR 2.5 (H) 11/20/2018   INR 2.9 (H) 11/19/2018   INR 1.5 (A) 11/06/2018   Radiology [place] Venous Img Lower Unilateral Right  Result Date: 02/17/2019 CLINICAL DATA:  Right leg pain.  History of DVT left leg. EXAM: RIGHT LOWER EXTREMITY VENOUS DOPPLER ULTRASOUND TECHNIQUE: Gray-scale sonography with graded compression, as well as color Doppler and duplex ultrasound were performed to evaluate the lower extremity deep venous systems from the level of the common femoral vein and including the common femoral,  femoral, profunda femoral, popliteal and calf veins including the posterior tibial, peroneal and gastrocnemius veins when visible. The superficial great saphenous vein was also interrogated. Spectral Doppler was utilized to evaluate flow at rest and with distal augmentation maneuvers in the common femoral, femoral and popliteal veins. COMPARISON:  Left lower extremity color flow duplex Doppler 11/19/2018. FINDINGS: Contralateral Common Femoral Vein: Respiratory phasicity is normal and symmetric with the symptomatic side. No evidence of thrombus. Normal compressibility. Common Femoral Vein: No evidence of thrombus. Normal compressibility, respiratory phasicity and response to augmentation. Saphenofemoral Junction: No evidence of thrombus. Normal compressibility and flow on color Doppler imaging. Profunda Femoral Vein: No evidence of thrombus. Normal compressibility and flow on color Doppler imaging. Femoral Vein: No evidence of thrombus. Normal compressibility, respiratory phasicity and response to augmentation. Popliteal Vein: Occlusive thrombus noted in the popliteal vein. Calf Veins: Occlusive thrombus noted in the peroneal veins. Superficial Great Saphenous Vein: No evidence of thrombus. Normal compressibility. Other Findings:  None. IMPRESSION: Occlusive thrombus noted in the popliteal vein and peroneal veins. Electronically Signed   By: Maisie Fushomas  Register   On: 02/17/2019 14:30   Assessment/Plan The patient is a 48 year old male with a past medical history of sleep apnea, current everyday tobacco abuse, obesity, history of left lower extremity DVT status post venous lysis and IVC filter insertion and subsequent removal on Xarelto who presents to the [facility] emergency department with a chief complaint impressively worsening right leg pain. 1. Recurrent DVT: Patient with recent diagnosis of left lower extremity DVT status post IVC filter placement and subsequent removal.  The patient  notes that he has been taking his Xarelto on a daily basis however has thrown a new clot to the right lower extremity specifically the popliteal and the peroneal vein.  Would consider the patient failing oral anticoagulation at this point.  He is at increased risk for pulmonary embolism.  Recommend placement of an IVC filter which would likely be permanent.  Also recommend a right lower extremity venous lysis and attempt to improve the patient's physical exam/symptoms.  We will plan on an IVC filter placement with right lower extremity venous lysis with Dr. Gilda CreaseSchnier tomorrow.  Procedure, risk and benefits explained to the patient.  All questions answered.  Patient wishes to proceed.  Would start heparin drip.  2. Tobacco Abuse: We had a discussion for approximately three minutes regarding the absolute need for smoking  cessation due to the deleterious nature of tobacco on the vascular system. We discussed the tobacco use would diminish patency of any intervention, and likely significantly worsen progressio of disease. We discussed multiple agents for quitting including replacement therapy or medications to reduce cravings such as Chantix. The patient voices their understanding of the importance of smoking cessation. 3. Obesity: This is followed by the patient's primary care physician.  Encourage weight loss as this is a risk factor for atherosclerotic disease.  Discussed with Dr. Romie JumperSchnier  Adalynne Steffensmeier A Nilo Fallin, PA-C  02/17/2019 4:57 PM  This note was created with Dragon medical transcription system.  Any error is purely unintentional

## 2019-02-17 NOTE — H&P (Signed)
[facility] [place] [facility]   PATIENT NAME: Matthew [name] Valent    MR#:  [id number]  DATE OF BIRTH:  01/12/1971  DATE OF ADMISSION:  02/17/2019  PRIMARY CARE PHYSICIAN: Patient, No Pcp Per   REQUESTING/REFERRING PHYSICIAN: Dr Darnelle CatalanMalinda  CHIEF COMPLAINT:  Right LE swelling and pain 1 day  HISTORY OF PRESENT ILLNESS:  Matthew [name] Rather  is a 48 y.o. male with a known history of sleep apnea, current elevated tobacco abuse, obesity, left lower extremity DVT status post Venus lysis and IVC filter insertion in the past with subsequent removal on daily Xarelto presents to the emergency room with right worsening leg pain and swelling.  Patient was found to have DVT in the right lower extremity. He denies any recent surgery trauma or any illness.  Reports that that he did not take his Xarelto past weekend because he forgot.  Kim Stegmayer vascular PA's outpatient and patient is good to be admitted for thrombolysis. Dr. Gilda CreaseSchnier aware.  IV heparin drip is recommended by vascular surgery.    PAST MEDICAL HISTORY:   Past Medical History:  Diagnosis Date  . Hx of blood clots   . Sleep apnea     PAST SURGICAL HISTOIRY:   Past Surgical History:  Procedure Laterality Date  . IVC FILTER INSERTION Left 11/24/2018   Procedure: IVC FILTER INSERTION;  Surgeon: Annice Needyew, Jason S, MD;  Location: [facility] INVASIVE CV LAB;  Service: Cardiovascular;  Laterality: Left;  . IVC FILTER REMOVAL N/A 12/01/2018   Procedure: IVC FILTER REMOVAL;  Surgeon: Annice Needyew, Jason S, MD;  Location: [facility] INVASIVE CV LAB;  Service: Cardiovascular;  Laterality: N/A;  . NO PAST SURGERIES      SOCIAL HISTORY:   Social History   Tobacco Use  . Smoking status: Current Every Day Smoker    Packs/day: 0.50    Types: Cigarettes  . Smokeless tobacco: Never Used  Substance Use Topics  . Alcohol use: Yes    Comment: occassional    FAMILY HISTORY:  No family history on file.  DRUG ALLERGIES:  No Known  Allergies  REVIEW OF SYSTEMS:  Review of Systems  Constitutional: Negative for chills, fever and weight loss.  HENT: Negative for ear discharge, ear pain and nosebleeds.   Eyes: Negative for blurred vision, pain and discharge.  Respiratory: Negative for sputum production, shortness of breath, wheezing and stridor.   Cardiovascular: Positive for leg swelling. Negative for chest pain, palpitations, orthopnea and PND.  Gastrointestinal: Negative for abdominal pain, diarrhea, nausea and vomiting.  Genitourinary: Negative for frequency and urgency.  Musculoskeletal: Negative for back pain and joint pain.  Neurological: Negative for sensory change, speech change, focal weakness and weakness.  Psychiatric/Behavioral: Negative for depression and hallucinations. The patient is not nervous/anxious.      MEDICATIONS AT HOME:   Prior to Admission medications   Medication Sig Start Date End Date Taking? Authorizing Provider  albuterol (ACCUNEB) 1.25 MG/3ML nebulizer solution Take 1 ampule by nebulization every 6 (six) hours as needed for wheezing.   Yes [provider]  escitalopram (LEXAPRO) 10 MG tablet Take 1 tablet (10 mg total) by mouth daily for 30 days. Patient taking differently: Take 10 mg by mouth daily as needed (irritability).  01/09/19 02/17/19 Yes Iloabachie, Chioma E, NP  furosemide (LASIX) 20 MG tablet Take 1 tablet (20 mg total) by mouth daily. 12/03/18  Yes Iloabachie, Chioma E, NP  rivaroxaban (XARELTO) 20 MG TABS tablet Take 1 tablet (20 mg total) by mouth  daily with supper. 01/08/19  Yes Iloabachie, Chioma E, NP  triamcinolone cream (KENALOG) 0.1 % Apply 1 application topically 2 (two) times daily. 02/04/19  Yes Iloabachie, Chioma E, NP      VITAL SIGNS:  Blood pressure (!) 116/59, pulse 90, temperature 98.2 F (36.8 C), temperature source Oral, resp. rate 17, weight 129.7 kg, SpO2 (!) 71 %.  PHYSICAL EXAMINATION:  GENERAL:  48 y.o.-year-old patient lying in the bed with no  acute distress.  EYES: Pupils equal, round, reactive to light and accommodation. No scleral icterus. Extraocular muscles intact.  HEENT: Head atraumatic, normocephalic. Oropharynx and nasopharynx clear.  NECK:  Supple, no jugular venous distention. No thyroid enlargement, no tenderness.  LUNGS: Normal breath sounds bilaterally, no wheezing, rales,rhonchi or crepitation. No use of accessory muscles of respiration.  CARDIOVASCULAR: S1, S2 normal. No murmurs, rubs, or gallops.  ABDOMEN: Soft, nontender, nondistended. Bowel sounds present. No organomegaly or mass.  EXTREMITIES: right leg lower extremity swelling with pain NEUROLOGIC: Cranial nerves II through XII are intact. Muscle strength 5/5 in all extremities. Sensation intact. Gait not checked.  PSYCHIATRIC: The patient is alert and oriented x 3.  SKIN: No obvious rash, lesion, or ulcer.   LABORATORY PANEL:   CBC Recent Labs  Lab 02/17/19 1304  WBC 10.3  HGB 18.4*  HCT 55.2*  PLT 157   ------------------------------------------------------------------------------------------------------------------  Chemistries  Recent Labs  Lab 02/17/19 1304  NA 140  K 4.2  CL 99  CO2 32  GLUCOSE 103*  BUN 7  CREATININE 0.73  CALCIUM 9.2   ------------------------------------------------------------------------------------------------------------------  Cardiac Enzymes No results for input(s): TROPONINI in the last 168 hours. ------------------------------------------------------------------------------------------------------------------  RADIOLOGY:  Ct Angio Chest Pe W And/or Wo Contrast  Result Date: 02/17/2019 CLINICAL DATA:  Hypoxia.  New right lower extremity DVT. EXAM: CT ANGIOGRAPHY CHEST WITH CONTRAST TECHNIQUE: Multidetector CT imaging of the chest was performed using the standard protocol during bolus administration of intravenous contrast. Multiplanar CT image reconstructions and MIPs were obtained to evaluate the vascular  anatomy. CONTRAST:  75mL OMNIPAQUE IOHEXOL 350 MG/ML SOLN COMPARISON:  CTA chest dated November 19, 2018. FINDINGS: Cardiovascular: Satisfactory opacification of the pulmonary arteries to the segmental level. No evidence of acute pulmonary embolism. Interval decrease in size of the peripheral nonocclusive thrombus in the right interlobar pulmonary artery with unchanged small web like defects at the right lower lobe segmental pulmonary artery origins. Unchanged severe dilatation of the main pulmonary artery with mild cardiomegaly, right heart enlargement, and reflux of contrast into the intrahepatic veins. No pericardial effusion. No thoracic aortic aneurysm. Mediastinum/Nodes: No enlarged mediastinal, hilar, or axillary lymph nodes. Thyroid gland, trachea, and esophagus demonstrate no significant findings. Lungs/Pleura: No focal consolidation, pleural effusion, or pneumothorax. Unchanged scarring in the right middle lobe. No suspicious pulmonary nodule. Upper Abdomen: No acute abnormality. Unchanged cholelithiasis and small right adrenal myelolipoma. Musculoskeletal: No chest wall abnormality. No acute or significant osseous findings. Review of the MIP images confirms the above findings. IMPRESSION: 1.  No evidence of acute pulmonary embolism. 2. 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 3. Unchanged pulmonary arterial hypertension and right heart dysfunction. Electronically Signed   By: Obie DredgeWilliam T Derry M.D.   On: 02/17/2019 18:03   [place] Venous Img Lower Unilateral Right  Result Date: 02/17/2019 CLINICAL DATA:  Right leg pain.  History of DVT left leg. EXAM: RIGHT LOWER EXTREMITY VENOUS DOPPLER ULTRASOUND TECHNIQUE:  Gray-scale sonography with graded compression, as well as color Doppler and duplex ultrasound were performed to evaluate the lower extremity deep venous  systems from the level of the common femoral vein and including the common femoral, femoral, profunda femoral, popliteal and calf veins including the posterior tibial, peroneal and gastrocnemius veins when visible. The superficial great saphenous vein was also interrogated. Spectral Doppler was utilized to evaluate flow at rest and with distal augmentation maneuvers in the common femoral, femoral and popliteal veins. COMPARISON:  Left lower extremity color flow duplex Doppler 11/19/2018. FINDINGS: Contralateral Common Femoral Vein: Respiratory phasicity is normal and symmetric with the symptomatic side. No evidence of thrombus. Normal compressibility. Common Femoral Vein: No evidence of thrombus. Normal compressibility, respiratory phasicity and response to augmentation. Saphenofemoral Junction: No evidence of thrombus. Normal compressibility and flow on color Doppler imaging. Profunda Femoral Vein: No evidence of thrombus. Normal compressibility and flow on color Doppler imaging. Femoral Vein: No evidence of thrombus. Normal compressibility, respiratory phasicity and response to augmentation. Popliteal Vein: Occlusive thrombus noted in the popliteal vein. Calf Veins: Occlusive thrombus noted in the peroneal veins. Superficial Great Saphenous Vein: No evidence of thrombus. Normal compressibility. Other Findings:  None. IMPRESSION: Occlusive thrombus noted in the popliteal vein and peroneal veins. Electronically Signed   By: Marcello Moores  Register   On: 02/17/2019 14:30    EKG:    IMPRESSION AND PLAN:   Andi Mahaffy  is a 48 y.o. male with a known history of sleep apnea, current elevated tobacco abuse, obesity, left lower extremity DVT status post Venus lysis and IVC filter insertion in the past with subsequent removal on daily Xarelto presents to the emergency room with right worsening leg pain and swelling.  1. 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 Obstructive sleep apnea/morbid obesity -weight reduction discussed  4. Chronic tobacco abuse advised smoking cessation      All the records are reviewed and case discussed with ED provider.   CODE STATUS: full  TOTAL TIME TAKING CARE OF THIS PATIENT: **50* minutes.    Fritzi Mandes M.D on 02/17/2019 at 6:23 PM  Between 7am to 6pm - Pager - [phone]  After 6pm go to [email or web address] - password [email or web address] [facility]  Office  [phone]  CC: Primary care physician; Patient, No Pcp Per

## 2019-02-17 NOTE — ED Triage Notes (Signed)
Pt in via EMS from with c/o pain to right leg. EMS reports that per pt he has a hx of the same. Right leg warm to touch and feels firm according to EMS report. 127/76, HR 78, 98.2 oral

## 2019-02-17 NOTE — ED Triage Notes (Signed)
Here via EMS from home with c/o right leg pain, hx of blood clot in left leg, states he can't put pressure on his right leg due to calf pain, NAD.

## 2019-02-17 NOTE — ED Notes (Signed)
This RN to bedside at this time, pt's sats noted to be 84-86%, O2 sensor reporsitioned on patient's ear with good reading, pt placed on 2L, snatched [place] stating "I go two fucking hands". This RN stated that patient could place it, notified MD.

## 2019-02-17 NOTE — ED Notes (Signed)
Pt given a meal tray and drink.

## 2019-02-17 NOTE — ED Provider Notes (Addendum)
[facility] Emergency Department Provider Note   ____________________________________________   First MD Initiated Contact with Patient 02/17/19 1511     (approximate)  I have reviewed the triage vital signs and the nursing notes.   HISTORY  Chief Complaint Leg Pain    HPI Matthew [name] is a 48 y.o. male who has had a DVT in the left leg with thrombectomy IVC was placed and removed for the thrombectomy patient has had a couple days of right calf pain now with some swelling.  He comes in an ultrasound shows a right popliteal vein DVT.  I have called Dr. Delana Meyer who is on-call today he is in the OR doing the procedure his PA will come down and evaluate the patient.  Patient is already taking Xarelto some sure what the next tap should be.         Past Medical History:  Diagnosis Date  . Hx of blood clots   . Sleep apnea     Patient Active Problem List   Diagnosis Date Noted  . DVT of lower extremity (deep venous thrombosis) ([place]) 11/19/2018    Past Surgical History:  Procedure Laterality Date  . IVC FILTER INSERTION Left 11/24/2018   Procedure: IVC FILTER INSERTION;  Surgeon: Algernon Huxley, MD;  Location: [facility];  Service: Cardiovascular;  Laterality: Left;  . IVC FILTER REMOVAL N/A 12/01/2018   Procedure: IVC FILTER REMOVAL;  Surgeon: Algernon Huxley, MD;  Location: [facility] [facility];  Service: Cardiovascular;  Laterality: N/A;  . NO PAST SURGERIES      Prior to Admission medications   Medication Sig Start Date End Date Taking? Authorizing Provider  albuterol (ACCUNEB) 1.25 MG/3ML nebulizer solution Take 1 ampule by nebulization every 6 (six) hours as needed for wheezing.    [provider]  ciprofloxacin (CIPRO) 500 MG tablet Take 1 tablet (500 mg total) by mouth 2 (two) times daily. 02/10/19   Iloabachie, Chioma E, NP  escitalopram (LEXAPRO) 10 MG tablet Take 1 tablet (10 mg total) by mouth daily for 30 days. 01/09/19 02/08/19   Iloabachie, Chioma E, NP  furosemide (LASIX) 20 MG tablet Take 1 tablet (20 mg total) by mouth daily. 12/03/18   Iloabachie, Chioma E, NP  rivaroxaban (XARELTO) 20 MG TABS tablet Take 1 tablet (20 mg total) by mouth daily with supper. 01/08/19   Iloabachie, Chioma E, NP  triamcinolone cream (KENALOG) 0.1 % Apply 1 application topically 2 (two) times daily. 02/04/19   Iloabachie, Chioma E, NP    Allergies Patient has no known allergies.  No family history on file.  Social History Social History   Tobacco Use  . Smoking status: Current Every Day Smoker    Packs/day: 0.50    Types: Cigarettes  . Smokeless tobacco: Never Used  Substance Use Topics  . Alcohol use: Yes    Comment: occassional  . Drug use: Never    Review of Systems  Constitutional: No fever/chills Eyes: No visual changes. ENT: No sore throat. Cardiovascular: Denies chest pain. Respiratory: Denies shortness of breath. Gastrointestinal: No abdominal pain.  No nausea, no vomiting.  No diarrhea.  No constipation. Genitourinary: Negative for dysuria. Musculoskeletal: Negative for back pain. Skin: Negative for rash. Neurological: Negative for headaches, focal weakness     ____________________________________________   PHYSICAL EXAM:  VITAL SIGNS: ED Triage Vitals [02/17/19 1258]  Enc Vitals Group     BP 121/76     Pulse Rate 77  Resp 18     Temp 98.2 F (36.8 C)     Temp Source Oral     SpO2 97 %     Weight      Height      Head Circumference      Peak Flow      Pain Score 10     Pain Loc      Pain Edu?      Excl. in GC?     Constitutional: Alert and oriented. Well appearing and in no acute distress. Eyes: Conjunctivae are normal.  Head: Atraumatic. Nose: No congestion/rhinnorhea. Mouth/Throat: Mucous membranes are moist.  Oropharynx non-erythematous. Neck: No stridor.   Cardiovascular: Normal rate, regular rhythm. Grossly normal heart sounds.  Good peripheral circulation. Respiratory: Normal  respiratory effort.  No retractions. Lungs CTAB. Gastrointestinal: Soft and nontender. No distention. No abdominal bruits. No CVA tenderness. Musculoskeletal: Left leg is slightly swollen and edematous after the DVT right leg is also now red and swollen and painful with the new DVT not quite as big as the left leg. Neurologic:  Normal speech and language. No gross focal neurologic deficits are appreciated. No gait instability. Skin:  Skin is warm, dry and intact. No rash noted.   ____________________________________________   LABS (all labs ordered are listed, but only abnormal results are displayed)  Labs Reviewed  CBC - Abnormal; Notable for the following components:      Result Value   Hemoglobin 18.4 (*)    HCT 55.2 (*)    All other components within normal limits  BASIC METABOLIC PANEL - Abnormal; Notable for the following components:   Glucose, Bld 103 (*)    All other components within normal limits  BASIC METABOLIC PANEL  MAGNESIUM   ____________________________________________  EKG   ____________________________________________  RADIOLOGY  ED MD interpretation: Ultrasound of the leg shows occlusive thrombosis in the popliteal and peroneal veins per radiology.  Official radiology report(s): [place] Venous Img Lower Unilateral Right  Result Date: 02/17/2019 CLINICAL DATA:  Right leg pain.  History of DVT left leg. EXAM: RIGHT LOWER EXTREMITY VENOUS DOPPLER ULTRASOUND TECHNIQUE: Gray-scale sonography with graded compression, as well as color Doppler and duplex ultrasound were performed to evaluate the lower extremity deep venous systems from the level of the common femoral vein and including the common femoral, femoral, profunda femoral, popliteal and calf veins including the posterior tibial, peroneal and gastrocnemius veins when visible. The superficial great saphenous vein was also interrogated. Spectral Doppler was utilized to evaluate flow at rest and with distal  augmentation maneuvers in the common femoral, femoral and popliteal veins. COMPARISON:  Left lower extremity color flow duplex Doppler 11/19/2018. FINDINGS: Contralateral Common Femoral Vein: Respiratory phasicity is normal and symmetric with the symptomatic side. No evidence of thrombus. Normal compressibility. Common Femoral Vein: No evidence of thrombus. Normal compressibility, respiratory phasicity and response to augmentation. Saphenofemoral Junction: No evidence of thrombus. Normal compressibility and flow on color Doppler imaging. Profunda Femoral Vein: No evidence of thrombus. Normal compressibility and flow on color Doppler imaging. Femoral Vein: No evidence of thrombus. Normal compressibility, respiratory phasicity and response to augmentation. Popliteal Vein: Occlusive thrombus noted in the popliteal vein. Calf Veins: Occlusive thrombus noted in the peroneal veins. Superficial Great Saphenous Vein: No evidence of thrombus. Normal compressibility. Other Findings:  None. IMPRESSION: Occlusive thrombus noted in the popliteal vein and peroneal veins. Electronically Signed   By: Maisie Fushomas  Register   On: 02/17/2019 14:30    ____________________________________________   PROCEDURES  Procedure(s) performed (including Critical Care): Critical care time 35 minutes.  This includes spending time with the patient to explain what is going on talking to vascular messaging with the PA from vascular and reviewing the patient's old records and speaking with the hospitalist.  Procedures   ____________________________________________   INITIAL IMPRESSION / ASSESSMENT AND PLAN / ED COURSE  Vascular comes down to see patient the are planning to do a lysis of the clot tomorrow with IVC filter.  Want him in the hospital we will get this done.  I will asked them if they want heparin or continue on Xarelto for now or what.             ____________________________________________   FINAL CLINICAL  IMPRESSION(S) / ED DIAGNOSES  Final diagnoses:  Acute deep vein thrombosis (DVT) of popliteal vein of right lower extremity [facility](HCC)     ED Discharge Orders    None       Note:  This document was prepared using Dragon voice recognition software and may include unintentional dictation errors.    Arnaldo NatalMalinda, Rontae Inglett F, MD 02/17/19 1734    Arnaldo NatalMalinda, Sharlynn Seckinger F, MD 02/17/19 [id number]

## 2019-02-17 NOTE — ED Notes (Signed)
ED TO INPATIENT HANDOFF REPORT  ED Nurse Name and Phone #: Ashok CordiaStephaine [phone]  S Name/Age/Gender Matthew CrankerKelly Waln 48 y.o. male Room/Bed: ED25A/ED25A  Code Status   Code Status: Prior  Home/SNF/Other Home Patient oriented to: self, place, time and situation Is this baseline? Yes   Triage Complete: Triage complete  Chief Complaint rt leg pain ems  Triage Note Pt in via EMS from with c/o pain to right leg. EMS reports that per pt he has a hx of the same. Right leg warm to touch and feels firm according to EMS report. 127/76, HR 78, 98.2 oral  Here via EMS from home with c/o right leg pain, hx of blood clot in left leg, states he can't put pressure on his right leg due to calf pain, NAD.    Allergies No Known Allergies  Level of Care/Admitting Diagnosis ED Disposition    ED Disposition Condition Comment   Admit  [facility] [[id number]]  Level of Care: Med-Surg [16]  Covid Evaluation: N/A  Diagnosis: Acute DVT (deep venous thrombosis) (HCC) [086578][708312]  Admitting Physician: Joselyn GlassmanPATEL, SONA [[id number]]  Attending Physician: Joselyn GlassmanPATEL, SONA [[id number]]  Estimated length of stay: past midnight tomorrow  Certification:: I certify this patient will need inpatient services for at least 2 midnights  PT Class (Do Not Modify): Inpatient [101]  PT Acc Code (Do Not Modify): Private [1]       B Medical/Surgery History Past Medical History:  Diagnosis Date  . Hx of blood clots   . Sleep apnea    Past Surgical History:  Procedure Laterality Date  . IVC FILTER INSERTION Left 11/24/2018   Procedure: IVC FILTER INSERTION;  Surgeon: Annice Needyew, Jason S, MD;  Location: [facility] IN[facility];  Service: Cardiovascular;  Laterality: Left;  . IVC FILTER REMOVAL N/A 12/01/2018   Procedure: IVC FILTER REMOVAL;  Surgeon: Annice Needyew, Jason S, MD;  Location: [facility] INV[facility];  Service: Cardiovascular;  Laterality: N/A;  . NO PAST SURGERIES       A IV Location/Drains/Wounds Patient  Lines/Drains/Airways Status   Active Line/Drains/Airways    Name:   Placement date:   Placement time:   Site:   Days:   Peripheral IV 02/17/19 Right Forearm   02/17/19    1548    Forearm   less than 1   Peripheral IV 02/17/19 Right Antecubital   02/17/19    1712    Antecubital   less than 1          Intake/Output Last 24 hours No intake or output data in the 24 hours ending 02/17/19 1821  Labs/Imaging Results for orders placed or performed during the hospital encounter of 02/17/19 (from the past 48 hour(s))  CBC     Status: Abnormal   Collection Time: 02/17/19  1:04 PM  Result Value Ref Range   WBC 10.3 4.0 - 10.5 K/uL   RBC 5.56 4.22 - 5.81 MIL/uL   Hemoglobin 18.4 (H) 13.0 - 17.0 g/dL   HCT 46.955.2 (H) 62.939.0 - 52.852.0 %   MCV 99.3 80.0 - 100.0 fL   MCH 33.1 26.0 - 34.0 pg   MCHC 33.3 30.0 - 36.0 g/dL   RDW 41.315.3 24.411.5 - 01.015.5 %   Platelets 157 150 - 400 K/uL   nRBC 0.0 0.0 - 0.2 %    Comment: Performed at [facility], [address] [place], [place] [address]  Basic metabolic panel     Status: Abnormal   Collection Time: 02/17/19  1:04 PM  Result Value Ref Range   Sodium 140 135 - 145 mmol/L   Potassium 4.2 3.5 - 5.1 mmol/L   Chloride 99 98 - 111 mmol/L   CO2 32 22 - 32 mmol/L   Glucose, Bld 103 (H) 70 - 99 mg/dL   BUN 7 6 - 20 mg/dL   Creatinine, Ser 0.73 0.61 - 1.24 mg/dL   Calcium 9.2 8.9 - 10.3 mg/dL   GFR calc non Af Amer >60 >60 mL/min   GFR calc Af Amer >60 >60 mL/min   Anion gap 9 5 - 15    Comment: Performed at [facility], [address], [place], [place] [address]   Ct Angio Chest Pe W And/or Wo Contrast  Result Date: 02/17/2019 CLINICAL DATA:  Hypoxia.  New right lower extremity DVT. EXAM: CT ANGIOGRAPHY CHEST WITH CONTRAST TECHNIQUE: Multidetector CT imaging of the chest was performed using the standard protocol during bolus administration of intravenous contrast. Multiplanar CT image reconstructions and MIPs were obtained to evaluate the  vascular anatomy. CONTRAST:  68mL OMNIPAQUE IOHEXOL 350 MG/ML SOLN COMPARISON:  CTA chest dated November 19, 2018. FINDINGS: Cardiovascular: Satisfactory opacification of the pulmonary arteries to the segmental level. No evidence of acute pulmonary embolism. Interval decrease in size of the peripheral nonocclusive thrombus in the right interlobar pulmonary artery with unchanged small web like defects at the right lower lobe segmental pulmonary artery origins. Unchanged severe dilatation of the main pulmonary artery with mild cardiomegaly, right heart enlargement, and reflux of contrast into the intrahepatic veins. No pericardial effusion. No thoracic aortic aneurysm. Mediastinum/Nodes: No enlarged mediastinal, hilar, or axillary lymph nodes. Thyroid gland, trachea, and esophagus demonstrate no significant findings. Lungs/Pleura: No focal consolidation, pleural effusion, or pneumothorax. Unchanged scarring in the right middle lobe. No suspicious pulmonary nodule. Upper Abdomen: No acute abnormality. Unchanged cholelithiasis and small right adrenal myelolipoma. Musculoskeletal: No chest wall abnormality. No acute or significant osseous findings. Review of the MIP images confirms the above findings. IMPRESSION: 1.  No evidence of acute pulmonary embolism. 2. 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 3. Unchanged pulmonary arterial hypertension and right heart dysfunction. Electronically Signed   By: Titus Dubin M.D.   On: 02/17/2019 18:03   US Venous Img Lower Unilateral Right  Result Date: 02/17/2019 CLINICAL DATA:  Right leg pain.  History of DVT left leg. EXAM: RIGHT LOWER EXTREMITY VENOUS DOPPLER ULTRASOUND TECHNIQUE: Gray-scale sonography with graded compression, as well as color Doppler and duplex ultrasound were performed to evaluate the lower extremity deep  venous systems from the level of the common femoral vein and including the common femoral, femoral, profunda femoral, popliteal and calf veins including the posterior tibial, peroneal and gastrocnemius veins when visible. The superficial great saphenous vein was also interrogated. Spectral Doppler was utilized to evaluate flow at rest and with distal augmentation maneuvers in the common femoral, femoral and popliteal veins. COMPARISON:  Left lower extremity color flow duplex Doppler 11/19/2018. FINDINGS: Contralateral Common Femoral Vein: Respiratory phasicity is normal and symmetric with the symptomatic side. No evidence of thrombus. Normal compressibility. Common Femoral Vein: No evidence of thrombus. Normal compressibility, respiratory phasicity and response to augmentation. Saphenofemoral Junction: No evidence of thrombus. Normal compressibility and flow on color Doppler imaging. Profunda Femoral Vein: No evidence of thrombus. Normal compressibility and flow on color Doppler imaging. Femoral Vein: No evidence of thrombus. Normal compressibility,  respiratory phasicity and response to augmentation. Popliteal Vein: Occlusive thrombus noted in the popliteal vein. Calf Veins: Occlusive thrombus noted in the peroneal veins. Superficial Great Saphenous Vein: No evidence of thrombus. Normal compressibility. Other Findings:  None. IMPRESSION: Occlusive thrombus noted in the popliteal vein and peroneal veins. Electronically Signed   By: Maisie Fushomas  Register   On: 02/17/2019 14:30    Pending Labs Unresulted Labs (From admission, onward)    Start     Ordered   02/18/19 0500  Basic metabolic panel  Tomorrow morning,   STAT     02/17/19 1648   02/18/19 0500  Magnesium  Tomorrow morning,   STAT     02/17/19 1648   02/17/19 1811  Heparin level (unfractionated)  Add-on,   AD     02/17/19 1812   02/17/19 1746  Protime-INR  ONCE - STAT,   STAT     02/17/19 1745   02/17/19 1745  APTT  ONCE - STAT,   STAT     02/17/19  1745          Vitals/Pain Today's Vitals   02/17/19 1700 02/17/19 1730 02/17/19 1745 02/17/19 1755  BP: 139/87 (Abnormal) 116/59    Pulse: 87 98 90   Resp:      Temp:      TempSrc:      SpO2: 91% (Abnormal) 85% (Abnormal) 71%   Weight:    129.7 kg  PainSc:        Isolation Precautions No active isolations  Medications Medications  0.9 %  sodium chloride infusion (has no administration in time range)  heparin injection 60 Units/kg (has no administration in time range)  heparin ADULT infusion 100 units/mL (25000 units/28350mL sodium chloride 0.45%) (has no administration in time range)  morphine 4 MG/ML injection 4 mg (4 mg Intravenous Given 02/17/19 1552)  ondansetron (ZOFRAN) injection 4 mg (4 mg Intravenous Given 02/17/19 1552)  ipratropium-albuterol (DUONEB) 0.5-2.5 (3) MG/3ML nebulizer solution 3 mL (3 mLs Nebulization Given 02/17/19 1720)  iohexol (OMNIPAQUE) 350 MG/ML injection 75 mL (75 mLs Intravenous Contrast Given 02/17/19 1719)    Mobility walks Low fall risk   Focused Assessments Cardiac Assessment Handoff:    No results found for: CKTOTAL, CKMB, CKMBINDEX, TROPONINI No results found for: DDIMER Does the Patient currently have chest pain? No      R Recommendations: See Admitting Provider Note  Report given to:   Additional Notes: na

## 2019-02-17 NOTE — ED Notes (Signed)
Walked pt to room 25 and helped him get on the bed.

## 2019-02-18 ENCOUNTER — Encounter
Admission: EM | Disposition: A | Discharge status: Home / Self Care | Payer: Self-pay | Source: Home / Self Care | Attending: Internal Medicine

## 2019-02-18 DIAGNOSIS — I82401 Acute embolism and thrombosis of unspecified deep veins of right lower extremity: Secondary | ICD-10-CM

## 2019-02-18 DIAGNOSIS — I2699 Other pulmonary embolism without acute cor pulmonale: Secondary | ICD-10-CM

## 2019-02-18 DIAGNOSIS — Z9114 Patient's other noncompliance with medication regimen: Secondary | ICD-10-CM

## 2019-02-18 HISTORY — PX: IVC FILTER INSERTION: CATH118245

## 2019-02-18 LAB — BASIC METABOLIC PANEL
Anion gap: 7 (ref 5–15)
BUN: 8 mg/dL (ref 6–20)
CO2: 33 mmol/L — ABNORMAL HIGH (ref 22–32)
Calcium: 8.7 mg/dL — ABNORMAL LOW (ref 8.9–10.3)
Chloride: 100 mmol/L (ref 98–111)
Creatinine, Ser: 0.81 mg/dL (ref 0.61–1.24)
GFR calc Af Amer: >60 mL/min (ref >60)
GFR calc non Af Amer: >60 mL/min (ref >60)
Glucose, Bld: 131 mg/dL — ABNORMAL HIGH (ref 70–99)
Potassium: 4.3 mmol/L (ref 3.5–5.1)
Sodium: 140 mmol/L (ref 135–145)

## 2019-02-18 LAB — CBC
HCT: 50.9 % (ref 39.0–52.0)
Hemoglobin: 16 g/dL (ref 13.0–17.0)
MCH: 32.1 pg (ref 26.0–34.0)
MCHC: 31.4 g/dL (ref 30.0–36.0)
MCV: 102 fL — ABNORMAL HIGH (ref 80.0–100.0)
Platelets: 155 10*3/uL (ref 150–400)
RBC: 4.99 MIL/uL (ref 4.22–5.81)
RDW: 15.7 % — ABNORMAL HIGH (ref 11.5–15.5)
WBC: 8.8 10*3/uL (ref 4.0–10.5)
nRBC: 0 % (ref 0.0–0.2)

## 2019-02-18 LAB — HEPARIN LEVEL (UNFRACTIONATED)
Heparin Unfractionated: 0.33 IU/mL (ref 0.30–0.70)
Heparin Unfractionated: 0.15 IU/mL — ABNORMAL LOW (ref 0.30–0.70)
Heparin Unfractionated: 0.67 IU/mL (ref 0.30–0.70)

## 2019-02-18 LAB — SURGICAL PCR SCREEN
MRSA, PCR: NEGATIVE
Staphylococcus aureus: POSITIVE — AB

## 2019-02-18 LAB — MAGNESIUM: Magnesium: 1.9 mg/dL (ref 1.7–2.4)

## 2019-02-18 LAB — APTT: aPTT: 59 seconds — ABNORMAL HIGH (ref 24–36)

## 2019-02-18 SURGERY — IVC FILTER INSERTION
Anesthesia: Moderate Sedation | Laterality: Right

## 2019-02-18 MED ORDER — CEFAZOLIN SODIUM-DEXTROSE 2-4 GM/100ML-% IV SOLN
2.0000 g | Freq: Once | INTRAVENOUS | Status: DC
  Filled 2019-02-18 (×2): qty 100

## 2019-02-18 MED ORDER — FENTANYL CITRATE (PF) 100 MCG/2ML IJ SOLN
INTRAMUSCULAR | Status: AC
  Filled 2019-02-18: qty 4

## 2019-02-18 MED ORDER — MIDAZOLAM HCL 2 MG/2ML IJ SOLN
INTRAMUSCULAR | Status: DC | PRN
  Administered 2019-02-18: 2 mg via INTRAVENOUS

## 2019-02-18 MED ORDER — MIDAZOLAM HCL 2 MG/ML PO SYRP
8.0000 mg | ORAL_SOLUTION | Freq: Once | ORAL | Status: DC | PRN
  Filled 2019-02-18: qty 4

## 2019-02-18 MED ORDER — HEPARIN SODIUM (PORCINE) 5000 UNIT/ML IJ SOLN
3000.0000 [IU] | Freq: Once | INTRAMUSCULAR | Status: AC
  Administered 2019-02-18: 3000 [IU] via INTRAVENOUS

## 2019-02-18 MED ORDER — LIDOCAINE HCL (PF) 1 % IJ SOLN
INTRAMUSCULAR | Status: AC
  Filled 2019-02-18: qty 30

## 2019-02-18 MED ORDER — MIDAZOLAM HCL 5 MG/5ML IJ SOLN
INTRAMUSCULAR | Status: AC
  Filled 2019-02-18: qty 10

## 2019-02-18 MED ORDER — IODIXANOL 320 MG/ML IV SOLN
INTRAVENOUS | Status: DC | PRN
  Administered 2019-02-18: 10 mL via INTRAVENOUS

## 2019-02-18 MED ORDER — FAMOTIDINE 20 MG PO TABS: 40.0000 mg | ORAL_TABLET | Freq: Once | ORAL | Status: DC | PRN

## 2019-02-18 MED ORDER — SODIUM CHLORIDE 0.9 % IV SOLN: INTRAVENOUS | Status: DC

## 2019-02-18 MED ORDER — CHLORHEXIDINE GLUCONATE CLOTH 2 % EX PADS
6.0000 | MEDICATED_PAD | Freq: Once | CUTANEOUS | Status: AC
  Administered 2019-02-18: 6 via TOPICAL

## 2019-02-18 MED ORDER — HYDROMORPHONE HCL 1 MG/ML IJ SOLN: 1.0000 mg | Freq: Once | INTRAMUSCULAR | Status: DC | PRN

## 2019-02-18 MED ORDER — ONDANSETRON HCL 4 MG/2ML IJ SOLN: 4.0000 mg | Freq: Four times a day (QID) | INTRAMUSCULAR | Status: DC | PRN

## 2019-02-18 MED ORDER — METHYLPREDNISOLONE SODIUM SUCC 125 MG IJ SOLR: 125.0000 mg | Freq: Once | INTRAMUSCULAR | Status: DC | PRN

## 2019-02-18 MED ORDER — NALOXONE HCL 2 MG/2ML IJ SOSY
PREFILLED_SYRINGE | INTRAMUSCULAR | Status: AC
  Filled 2019-02-18: qty 2

## 2019-02-18 MED ORDER — HEPARIN SODIUM (PORCINE) 1000 UNIT/ML IJ SOLN
INTRAMUSCULAR | Status: AC
  Filled 2019-02-18: qty 1

## 2019-02-18 MED ORDER — NALOXONE HCL 0.4 MG/ML IJ SOLN
0.4000 mg | INTRAMUSCULAR | Status: DC | PRN
  Administered 2019-02-18: 1 mg via INTRAVENOUS

## 2019-02-18 MED ORDER — FENTANYL CITRATE (PF) 100 MCG/2ML IJ SOLN
INTRAMUSCULAR | Status: DC | PRN
  Administered 2019-02-18: 100 ug via INTRAVENOUS

## 2019-02-18 MED ORDER — DIPHENHYDRAMINE HCL 50 MG/ML IJ SOLN: 50.0000 mg | Freq: Once | INTRAMUSCULAR | Status: DC | PRN

## 2019-02-18 SURGICAL SUPPLY — 8 items
DEVICE SAFEGUARD 24CM (GAUZE/BANDAGES/DRESSINGS) ×3 IMPLANT
DRAPE FEMORAL ANGIO W/ POUCH (DRAPES) ×3 IMPLANT
KIT FEMORAL DEL DENALI (Miscellaneous) ×3 IMPLANT
NEEDLE ENTRY 21GA 7CM ECHOTIP (NEEDLE) ×3 IMPLANT
PACK ANGIOGRAPHY (CUSTOM PROCEDURE TRAY) ×3 IMPLANT
SET INTRO CAPELLA COAXIAL (SET/KITS/TRAYS/PACK) ×3 IMPLANT
SHEATH 9FRX11 (SHEATH) ×3 IMPLANT
WIRE J 3MM .035X145CM (WIRE) ×3 IMPLANT

## 2019-02-18 NOTE — Progress Notes (Signed)
Dr. Delana Meyer at bedside speaking to pt. Re: procedure, complications. Pt. Placed on 4L/[place] and tolerating well. (BIPAP off at 17:40).

## 2019-02-18 NOTE — TOC Initial Note (Signed)
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  Patient frustrated at this time stating "I just want to go to the OR, yall can leave me without food like this".  Bedside RN updated  Patient followed by [facility] .  RNCM confirmed patient is active.  Patient has nurse appointment scheduled for 6/24.  Patient obtain his medications from [facility] .  RNCM spoke with Aldona Bar at [facility] .  She confirms that patient is active.  Last picked up is Xarelto on 5/7.  Patient was due to pick up again on 6/7 and has refills on file  Patient notified.  Patient states "I still have a bottle"  RNCN inquire about how he still has medication left if he should have run on on 6/7 if he was taking it as prescribed.  Patient states "maybe I missed a few, but I'm not worried about that" .  MDs notified   Expected Discharge Plan: Home/Self Care     Patient Goals and CMS Choice        Expected Discharge Plan and Services Expected Discharge Plan: Home/Self Care   Discharge Planning Services: CM Consult, Medication Assistance                                          Prior Living Arrangements/Services                       Activities of Daily Living Home Assistive Devices/Equipment: None ADL Screening (condition at time of admission) Patient's cognitive ability adequate to safely complete daily activities?: Yes Is the patient deaf or have difficulty hearing?: No Does the patient have difficulty seeing, even when wearing glasses/contacts?: No Does the patient have difficulty  concentrating, remembering, or making decisions?: No Patient able to express need for assistance with ADLs?: Yes Does the patient have difficulty dressing or bathing?: No Independently performs ADLs?: Yes (appropriate for developmental age) Does the patient have difficulty walking or climbing stairs?: Yes Weakness of Legs: Both Weakness of Arms/Hands: None  Permission Sought/Granted                  Emotional Assessment   Attitude/Demeanor/Rapport: Angry   Orientation: : Oriented to Self, Oriented to Place, Oriented to  Time, Oriented to Situation Alcohol / Substance Use: Tobacco Use    Admission diagnosis:  Acute deep vein thrombosis (DVT) of popliteal vein of right lower extremity ([place]) [I82.431] Patient Active Problem List   Diagnosis Date Noted  . Acute DVT (deep venous thrombosis) ([place]) 02/17/2019  . DVT of lower extremity (deep venous thrombosis) ([place]) 11/19/2018   PCP:  Patient, No Pcp Per Pharmacy:   [facility] [place], [address] [place] [address] Phone: [phone] Fax: [phone]     Social Determinants of Health (SDOH) Interventions    Readmission Risk Interventions Readmission Risk Prevention Plan 11/20/2018  Transportation Screening Complete  PCP or Specialist Appt within 3-5 Days (No Data)  HRI or Home Care Consult (No Data)  Social Work Consult for Recovery Care Planning/Counseling (No Data)  Palliative Care Screening Not Applicable  Medication Review Oceanographer(RN Care Manager) Complete

## 2019-02-18 NOTE — Progress Notes (Signed)
[employer] - [place] [facility]   PATIENT NAME: Matthew CrankerKelly [name]    MR#:  [id number]  DATE OF BIRTH:  06/01/1971  SUBJECTIVE:  CHIEF COMPLAINT:   Chief Complaint  Patient presents with  . Leg Pain   -Patient very irritable this morning.  Plan for right lower extremity thrombolysis for acute DVT and IVC filter placement. -Noncompliant with Xarelto at home  REVIEW OF SYSTEMS:  Review of Systems  Constitutional: Negative for chills, fever and malaise/fatigue.  HENT: Negative for congestion, ear discharge, hearing loss and nosebleeds.   Eyes: Negative for blurred vision and pain.  Respiratory: Negative for cough, shortness of breath and wheezing.   Cardiovascular: Positive for leg swelling. Negative for chest pain and palpitations.  Gastrointestinal: Negative for abdominal pain, constipation, diarrhea, nausea and vomiting.  Genitourinary: Negative for dysuria.  Musculoskeletal: Negative for myalgias.  Neurological: Negative for dizziness, focal weakness, seizures, weakness and headaches.  Psychiatric/Behavioral: Negative for depression.    DRUG ALLERGIES:  No Known Allergies  VITALS:  Blood pressure (!) 109/56, pulse 87, temperature 98.8 F (37.1 C), temperature source Oral, resp. rate 20, height 5\' 7"  (1.702 m), weight 131.6 kg, SpO2 93 %.  PHYSICAL EXAMINATION:  Physical Exam  GENERAL:  48 y.o.-year-old patient lying in the bed with no acute distress.  EYES: Pupils equal, round, reactive to light and accommodation. No scleral icterus. Extraocular muscles intact.  HEENT: Head atraumatic, normocephalic. Oropharynx and nasopharynx clear.  NECK:  Supple, no jugular venous distention. No thyroid enlargement, no tenderness.  LUNGS: Normal breath sounds bilaterally, no wheezing, rales,rhonchi or crepitation. No use of accessory muscles of respiration.  CARDIOVASCULAR: S1, S2 normal. No murmurs, rubs, or gallops.  ABDOMEN: Soft, nontender, nondistended.  Bowel sounds present. No organomegaly or mass.  EXTREMITIES: Right greater than left lower extremity swelling.  Erythema of the right leg noted as well.Marland Kitchen.  NEUROLOGIC: Cranial nerves II through XII are intact. Muscle strength 5/5 in all extremities. Sensation intact. Gait not checked.  PSYCHIATRIC: The patient is alert and oriented x 3.  Very frustrated and irritable SKIN: No obvious rash, lesion, or ulcer.    LABORATORY PANEL:   CBC Recent Labs  Lab 02/18/19 0657  WBC 8.8  HGB 16.0  HCT 50.9  PLT 155   ------------------------------------------------------------------------------------------------------------------  Chemistries  Recent Labs  Lab 02/18/19 0103  NA 140  K 4.3  CL 100  CO2 33*  GLUCOSE 131*  BUN 8  CREATININE 0.81  CALCIUM 8.7*  MG 1.9   ------------------------------------------------------------------------------------------------------------------  Cardiac Enzymes No results for input(s): TROPONINI in the last 168 hours. ------------------------------------------------------------------------------------------------------------------  RADIOLOGY:  Ct Angio Chest Pe W And/or Wo Contrast  Result Date: 02/17/2019 CLINICAL DATA:  Hypoxia.  New right lower extremity DVT. EXAM: CT ANGIOGRAPHY CHEST WITH CONTRAST TECHNIQUE: Multidetector CT imaging of the chest was performed using the standard protocol during bolus administration of intravenous contrast. Multiplanar CT image reconstructions and MIPs were obtained to evaluate the vascular anatomy. CONTRAST:  75mL OMNIPAQUE IOHEXOL 350 MG/ML SOLN COMPARISON:  CTA chest dated November 19, 2018. FINDINGS: Cardiovascular: Satisfactory opacification of the pulmonary arteries to the segmental level. No evidence of acute pulmonary embolism. Interval decrease in size of the peripheral nonocclusive thrombus in the right interlobar pulmonary artery with unchanged small web like defects at the right lower lobe segmental pulmonary  artery origins. Unchanged severe dilatation of the main pulmonary artery with mild cardiomegaly, right heart enlargement, and reflux of contrast into the intrahepatic veins. No pericardial effusion.  No thoracic aortic aneurysm. Mediastinum/Nodes: No enlarged mediastinal, hilar, or axillary lymph nodes. Thyroid gland, trachea, and esophagus demonstrate no significant findings. Lungs/Pleura: No focal consolidation, pleural effusion, or pneumothorax. Unchanged scarring in the right middle lobe. No suspicious pulmonary nodule. Upper Abdomen: No acute abnormality. Unchanged cholelithiasis and small right adrenal myelolipoma. Musculoskeletal: No chest wall abnormality. No acute or significant osseous findings. Review of the MIP images confirms the above findings. IMPRESSION: 1.  No evidence of acute pulmonary embolism. 2. 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 3. Unchanged pulmonary arterial hypertension and right heart dysfunction. Electronically Signed   By: William T Derry M.D.   On: 06/1Obie Dredge6/2020 18:03   [place] Venous Img Lower Unilateral Right  Result Date: 02/17/2019 CLINICAL DATA:  Right leg pain.  History of DVT left leg. EXAM: RIGHT LOWER EXTREMITY VENOUS DOPPLER ULTRASOUND TECHNIQUE: Gray-scale sonography with graded compression, as well as color Doppler and duplex ultrasound were performed to evaluate the lower extremity deep venous systems from the level of the common femoral vein and including the common femoral, femoral, profunda femoral, popliteal and calf veins including the posterior tibial, peroneal and gastrocnemius veins when visible. The superficial great saphenous vein was also interrogated. Spectral Doppler was utilized to evaluate flow at rest and with distal augmentation maneuvers in the common femoral, femoral and popliteal veins. COMPARISON:  Left  lower extremity color flow duplex Doppler 11/19/2018. FINDINGS: Contralateral Common Femoral Vein: Respiratory phasicity is normal and symmetric with the symptomatic side. No evidence of thrombus. Normal compressibility. Common Femoral Vein: No evidence of thrombus. Normal compressibility, respiratory phasicity and response to augmentation. Saphenofemoral Junction: No evidence of thrombus. Normal compressibility and flow on color Doppler imaging. Profunda Femoral Vein: No evidence of thrombus. Normal compressibility and flow on color Doppler imaging. Femoral Vein: No evidence of thrombus. Normal compressibility, respiratory phasicity and response to augmentation. Popliteal Vein: Occlusive thrombus noted in the popliteal vein. Calf Veins: Occlusive thrombus noted in the peroneal veins. Superficial Great Saphenous Vein: No evidence of thrombus. Normal compressibility. Other Findings:  None. IMPRESSION: Occlusive thrombus noted in the popliteal vein and peroneal veins. Electronically Signed   By: Maisie Fushomas  Register   On: 02/17/2019 14:30    EKG:   Orders placed or performed during the hospital encounter of 11/24/18  . EKG 12-Lead  . EKG 12-Lead    ASSESSMENT AND PLAN:   Matthew CrankerKelly Glazebrook  is a 48 y.o. male with a known history of sleep apnea,  tobacco abuse, obesity, recent admission for left lower extremity DVT status post Venus lysis and IVC filter insertion and subsequent removal, who has been noncompliant with his Xarelto comes back with right leg swelling.  1. 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  Supposed to have filled it 10 days ago at medication management clinic, and still has several Xarelto left at home-going through personal problems and not taking medication regularly. -Appreciate vascular consult.  Currently on IV heparin drip -Plan for clot lysis today and IVC filter placement which he might need permanently -Possible  discharge tomorrow on Xarelto  2. 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  However patient does not seem very interested  3. Obstructive sleep apnea/morbid obesity -Lifestyle changes recommended  4. Chronic tobacco abuse advised smoking cessation  5.  Depression-Lexapro  6.  DVT prophylaxis-currently on heparin drip   All the records are reviewed and case discussed with Care Management/Social Workerr. Management plans discussed with the patient, family and they are in agreement.  CODE STATUS: Full Code  TOTAL TIME TAKING CARE OF THIS PATIENT: 38 minutes.   POSSIBLE D/C TOMORROW, DEPENDING ON CLINICAL CONDITION.   Gladstone Lighter M.D on 02/18/2019 at 10:49 AM  Between 7am to 6pm - Pager - [phone]  After 6pm go to [email or web address] - password [email or web address] [facility]  Office  [phone]  CC: Primary care physician; Patient, No Pcp Per

## 2019-02-18 NOTE — Consult Note (Signed)
ANTICOAGULATION CONSULT NOTE  Pharmacy Consult for Heparin Drip Indication: DVT  Patient Measurements: Height: 5\' 7"  (170.2 cm) Weight: 290 lb 2 oz (131.6 kg) IBW/kg (Calculated) : 66.1 Heparin Dosing Weight: 101.7kg  Vital Signs: Temp: 98.8 F (37.1 C) (06/17 0549) Temp Source: Oral (06/17 0549) BP: 109/56 (06/17 0549) Pulse Rate: 87 (06/17 0549)  Labs: Recent Labs    02/17/19 1304 02/17/19 1800 02/17/19 1811 02/18/19 0103  HGB 18.4*  --   --   --   HCT 55.2*  --   --   --   PLT 157  --   --   --   APTT  --  26  --  59*  LABPROT  --  14.3  --   --   INR  --  1.1  --   --   HEPARINUNFRC  --   --  <0.10* 0.33  CREATININE 0.73  --   --  0.81    Estimated Creatinine Clearance: 145.6 mL/min (by C-G formula based on SCr of 0.81 mg/dL).   Medical History: Past Medical History:  Diagnosis Date  . Hx of blood clots   . Sleep apnea     Medications:  Home anticoagulant Xarelto 20mg  qd with supper - last dose 6/15 @ 1800  Assessment: Venous US shows occlusive thrombus noted in the popliteal vein and peroneal veins Baseline HL was < 0.10 indicating patient may not have been taking his Xarelto, which he confirms in conversation (forgot). H&H, PLT wnl  Goal of Therapy:  Heparin level 0.3-0.7 units/ml  Monitor platelets by anticoagulation protocol: Yes   Heparin Course: 6/16 initiation: 2000 unit bolus, then 1700 units/hr 6/17 0103 HL 0.33, aPTT 59s: no change 6/17 0657 HL 0.15   Plan:   Bolus 3000 units  Increase infusion rate to 2100 units/hr  F/U heparin level 6 hours after changes have been made  CBC in am  Dallie Piles, PharmD Clinical Pharmacist 02/18/2019 7:24 AM

## 2019-02-18 NOTE — Consult Note (Signed)
ANTICOAGULATION CONSULT NOTE  Pharmacy Consult for Heparin Drip Indication: DVT  Patient Measurements: Height: 5\' 7"  (170.2 cm) Weight: 290 lb 2 oz (131.6 kg) IBW/kg (Calculated) : 66.1 Heparin Dosing Weight: 101.7kg  Vital Signs: Temp: 98.4 F (36.9 C) (06/17 1452) Temp Source: Oral (06/17 1452) BP: 113/84 (06/17 1452) Pulse Rate: 77 (06/17 1452)  Labs: Recent Labs    02/17/19 1304 02/17/19 1800  02/18/19 0103 02/18/19 0657 02/18/19 1436  HGB 18.4*  --   --   --  16.0  --   HCT 55.2*  --   --   --  50.9  --   PLT 157  --   --   --  155  --   APTT  --  26  --  59*  --   --   LABPROT  --  14.3  --   --   --   --   INR  --  1.1  --   --   --   --   HEPARINUNFRC  --   --    < > 0.33 0.15* 0.67  CREATININE 0.73  --   --  0.81  --   --    < > = values in this interval not displayed.    Estimated Creatinine Clearance: 145.6 mL/min (by C-G formula based on SCr of 0.81 mg/dL).   Medical History: Past Medical History:  Diagnosis Date  . Hx of blood clots   . Sleep apnea     Medications:  Home anticoagulant Xarelto 20mg  qd with supper - last dose 6/15 @ 1800  Assessment: Venous US shows occlusive thrombus noted in the popliteal vein and peroneal veins Baseline HL was < 0.10 indicating patient may not have been taking his Xarelto, which he confirms in conversation (forgot). H&H, PLT wnl  Goal of Therapy:  Heparin level 0.3-0.7 units/ml  Monitor platelets by anticoagulation protocol: Yes   Heparin Course: 6/16 initiation: 2000 unit bolus, then 1700 units/hr 6/17 0103 HL 0.33, aPTT 59s: no change 6/17 0657 HL 0.15: bolus 3000 units, then increase rate to 2100 units/hr 6/17 1436 HL 0.67   Plan:   The patient is currently undergoing an IVC filter placement  Heparin infusion was stopped at 1450  Pharmacy will follow after surgery to determine if heparin will be continued  CBC in am  Dallie Piles, PharmD Clinical Pharmacist 02/18/2019 3:27 PM

## 2019-02-18 NOTE — Consult Note (Signed)
ANTICOAGULATION CONSULT NOTE - Initial Consult  Pharmacy Consult for Heparin Drip Indication: DVT  No Known Allergies  Patient Measurements: Height: 5\' 7"  (170.2 cm) Weight: 290 lb 2 oz (131.6 kg) IBW/kg (Calculated) : 66.1 Heparin Dosing Weight: 101.7kg  Vital Signs: Temp: 98.6 F (37 C) (06/16 2042) Temp Source: Oral (06/16 2042) BP: 95/56 (06/16 2042) Pulse Rate: 80 (06/16 2042)  Labs: Recent Labs    02/17/19 1304 02/17/19 1800 02/17/19 1811 02/18/19 0103  HGB 18.4*  --   --   --   HCT 55.2*  --   --   --   PLT 157  --   --   --   APTT  --  26  --  59*  LABPROT  --  14.3  --   --   INR  --  1.1  --   --   HEPARINUNFRC  --   --  <0.10* 0.33  CREATININE 0.73  --   --  0.81    Estimated Creatinine Clearance: 145.6 mL/min (by C-G formula based on SCr of 0.81 mg/dL).   Medical History: Past Medical History:  Diagnosis Date  . Hx of blood clots   . Sleep apnea     Medications:  Home anticoagulant Xarelto 20mg  qd with supper - last dose 6/15 @ 1800  Assessment: Pt was on heparin drip on a previous admission 3/18 and mildly subtherapeutic at a rate of 1600 units/hr.  Heparin dosing weight has increased since last admission by ~ 2-3 kg.  For these reasons, will initiate drip at a rate of 1700 units/hr.  Given timeline of pt last dose of Xarelto, will give a moderate bolus of 2000 units.   Goal of Therapy:  Heparin level 0.3-0.7 units/ml (APTT 66-102sec) Monitor platelets by anticoagulation protocol: Yes   Plan:  06/17 @ 0100 HL 0.33 therapeutic. Will continue current rate and will recheck HL @ 0700. Dosing per HL since BL HL was < 0.10 indicating patient may not have been taking his xarelto. Will check CBC w/ am labs.  Tobie Lords, PharmD, BCPS Clinical Pharmacist 02/18/2019 1:54 AM

## 2019-02-18 NOTE — Op Note (Signed)
[facility]   OPERATIVE NOTE    PRE-OPERATIVE DIAGNOSIS: DVT with PE  POST-OPERATIVE DIAGNOSIS: Same  PROCEDURE: 1.   Ultrasound guidance for vascular access to the left vein 2.   Catheter placement into the inferior vena cava 3.   Inferior venacavogram 4.   Placement of a [place] IVC filter  SURGEON: Hortencia Pilar  ASSISTANT(S): None  ANESTHESIA: Conscious sedation was administered by the interventional radiology RN under my direct supervision. IV Versed plus fentanyl were utilized. Continuous ECG, pulse oximetry and blood pressure was monitored throughout the entire procedure. Conscious sedation was for a total of 20 minutes.  ESTIMATED BLOOD LOSS: minimal  FINDING(S): 1.  Patent IVC  SPECIMEN(S):  none  INDICATIONS:   Matthew [name] is a 48 y.o. y.o. male who presents with right lower extremity DVT.  Last month he underwent thrombolysis for an acute left lower extremity DVT.  At that time filter was placed and subsequently the filter has been removed as the patient was supposed to be on anticoagulation.  It turns out the patient has not been taking his Xarelto as he had indicated he had been.  His medical noncompliance is now created a situation where he has developed DVT in the opposite leg and inferior vena cava filter is indicated for this reason.  Risks and benefits including filter thrombosis, migration, fracture, bleeding, and infection were all discussed.  We discussed that all IVC filters that we place can be removed if desired from the patient once the need for the filter has passed.    DESCRIPTION: After obtaining full informed written consent, the patient was brought back to the vascular suite. The skin was sterilely prepped and draped in a sterile surgical field was created. Ultrasound was placed in a sterile sleeve. The left common femoral vein was echolucent and compressible indicating patency. Image was recorded for the permanent record.  The puncture was made under continuous real-time ultrasound guidance.  The left common femoral vein was accessed under direct ultrasound guidance without difficulty with a micropuncture needle. Microwire was then advanced under fluoroscopic guidance without difficulty. Micro-sheath was then inserted and a J-wire was then placed. The dilator is passed over the wire and the delivery sheath was placed into the inferior vena cava.  Inferior venacavogram was performed. This demonstrated a patent IVC with the level of the renal veins at L2.  The filter was then deployed into the inferior vena cava at the level of L3 just below the renal veins. The delivery sheath was then removed. Pressure was held. Sterile dressings were placed. The patient tolerated the procedure well and was taken to the recovery room in stable condition.  Interpretation: Inferior vena cava is opacified with a bolus injection of contrast.  It measures approximately 26 mm in diameter.  Blush from the left renal vein is noted at L1-L2 and the filter is deployed in an upright position at L3  COMPLICATIONS: None  CONDITION: Stable  Hortencia Pilar  02/18/2019, 5:00 PM

## 2019-02-18 NOTE — Consult Note (Addendum)
ANTICOAGULATION CONSULT NOTE  Pharmacy Consult for Heparin Drip Indication: DVT  Patient Measurements: Height: 5\' 7"  (170.2 cm) Weight: 290 lb 2 oz (131.6 kg) IBW/kg (Calculated) : 66.1 Heparin Dosing Weight: 101.7kg  Vital Signs: Temp: 99.3 F (37.4 C) (06/17 1823) Temp Source: Oral (06/17 1823) BP: 112/72 (06/17 1823) Pulse Rate: 76 (06/17 1823)  Labs: Recent Labs    02/17/19 1304 02/17/19 1800  02/18/19 0103 02/18/19 0657 02/18/19 1436  HGB 18.4*  --   --   --  16.0  --   HCT 55.2*  --   --   --  50.9  --   PLT 157  --   --   --  155  --   APTT  --  26  --  59*  --   --   LABPROT  --  14.3  --   --   --   --   INR  --  1.1  --   --   --   --   HEPARINUNFRC  --   --    < > 0.33 0.15* 0.67  CREATININE 0.73  --   --  0.81  --   --    < > = values in this interval not displayed.    Estimated Creatinine Clearance: 145.6 mL/min (by C-G formula based on SCr of 0.81 mg/dL).   Medical History: Past Medical History:  Diagnosis Date  . Hx of blood clots   . Sleep apnea     Medications:  Home anticoagulant Xarelto 20mg  qd with supper - last dose 6/15 @ 1800  Assessment: Venous US shows occlusive thrombus noted in the popliteal vein and peroneal veins Baseline HL was < 0.10 indicating patient may not have been taking his Xarelto, which he confirms in conversation (forgot). H&H, PLT wnl  Goal of Therapy:  Heparin level 0.3-0.7 units/ml  Monitor platelets by anticoagulation protocol: Yes   Heparin Course: 6/16 initiation: 2000 unit bolus, then 1700 units/hr 6/17 0103 HL 0.33, aPTT 59s: no change 6/17 0657 HL 0.15: bolus 3000 units, then increase rate to 2100 units/hr 6/17 1436 HL 0.67   Plan:   The patient is currently undergoing an IVC filter placement  Heparin infusion was stopped at 1450 (restarted at ~ 1800)  Pharmacy spoke with nursing and current plan is for heparin to be continued at current rate  Will recheck Heparin Level 06/18 @ 0000  CBC in  am  Lu Duffel, PharmD Clinical Pharmacist 02/18/2019 6:39 PM

## 2019-02-19 DIAGNOSIS — Z95828 Presence of other vascular implants and grafts: Secondary | ICD-10-CM

## 2019-02-19 LAB — CBC
HCT: 49.9 % (ref 39.0–52.0)
HCT: 50.7 % (ref 39.0–52.0)
Hemoglobin: 15.8 g/dL (ref 13.0–17.0)
Hemoglobin: 15.9 g/dL (ref 13.0–17.0)
MCH: 32.1 pg (ref 26.0–34.0)
MCH: 32.3 pg (ref 26.0–34.0)
MCHC: 31.4 g/dL (ref 30.0–36.0)
MCHC: 31.7 g/dL (ref 30.0–36.0)
MCV: 101.4 fL — ABNORMAL HIGH (ref 80.0–100.0)
MCV: 102.8 fL — ABNORMAL HIGH (ref 80.0–100.0)
Platelets: 142 10*3/uL — ABNORMAL LOW (ref 150–400)
Platelets: 143 10*3/uL — ABNORMAL LOW (ref 150–400)
RBC: 4.92 MIL/uL (ref 4.22–5.81)
RBC: 4.93 MIL/uL (ref 4.22–5.81)
RDW: 15.1 % (ref 11.5–15.5)
RDW: 15.3 % (ref 11.5–15.5)
WBC: 10 10*3/uL (ref 4.0–10.5)
WBC: 10.1 10*3/uL (ref 4.0–10.5)
nRBC: 0 % (ref 0.0–0.2)
nRBC: 0.2 % (ref 0.0–0.2)

## 2019-02-19 LAB — HEPARIN LEVEL (UNFRACTIONATED)
Heparin Unfractionated: <0.1 IU/mL — ABNORMAL LOW (ref 0.30–0.70)
Heparin Unfractionated: 0.2 IU/mL — ABNORMAL LOW (ref 0.30–0.70)

## 2019-02-19 MED ORDER — HEPARIN BOLUS VIA INFUSION
3000.0000 [IU] | Freq: Once | INTRAVENOUS | Status: AC
  Administered 2019-02-19: 3000 [IU] via INTRAVENOUS
  Filled 2019-02-19: qty 3000

## 2019-02-19 MED ORDER — RIVAROXABAN 15 MG PO TABS
15.0000 mg | ORAL_TABLET | Freq: Two times a day (BID) | ORAL | Status: DC
  Administered 2019-02-19: 15 mg via ORAL
  Filled 2019-02-19 (×2): qty 1

## 2019-02-19 MED ORDER — RIVAROXABAN (XARELTO) VTE STARTER PACK (15 & 20 MG): ORAL_TABLET | ORAL | 1 refills | Status: DC

## 2019-02-19 NOTE — Progress Notes (Signed)
Discharge order received. Patient mental status is at baseline. Vital signs stable except O2 sats.Pt sats at rest was 88%. Refused to walk and refused portable o2 to go home with. No signs of acute distress. Discharge instructions given. Patient verbalized understanding. No other issues noted at this time.

## 2019-02-19 NOTE — TOC Transition Note (Signed)
Transition of Care [facility]) - CM/SW Discharge Note   Patient Details  Name: Matthew [name] MRN: [id number] Date of Birth: June 09, 1971  Transition of Care [facility]) CM/SW Contact:  Beverly Sessions, RN Phone Number: 02/19/2019, 9:30 AM   Clinical Narrative:    Patient to discharge home today.  Patient will discharge again on xarelto however the dose will be different because it's a starter pack.  Rx has been faxed to medication management.  Patient declined to wait for medications to be delivered to his room.  States " I will get them myself" .  MD notified.    Final next level of care: Home/Self Care Barriers to Discharge: Barriers Resolved   Patient Goals and CMS Choice        Discharge Placement                       Discharge Plan and Services   Discharge Planning Services: CM Consult, Medication Assistance                                 Social Determinants of Health (SDOH) Interventions     Readmission Risk Interventions Readmission Risk Prevention Plan 11/20/2018  Transportation Screening Complete  PCP or Specialist Appt within 3-5 Days (No Data)  [place] or [place] (No Data)  Social Work Consult for [place] Planning/Counseling (No Data)  Palliative Care Screening Not Applicable  Medication Review Press photographer) Complete

## 2019-02-19 NOTE — Consult Note (Signed)
ANTICOAGULATION CONSULT NOTE  Pharmacy Consult for Heparin Drip Indication: DVT  Patient Measurements: Height: 5\' 7"  (170.2 cm) Weight: 290 lb 2 oz (131.6 kg) IBW/kg (Calculated) : 66.1 Heparin Dosing Weight: 101.7kg  Vital Signs: Temp: 99.6 F (37.6 C) (06/17 1943) Temp Source: Oral (06/17 1943) BP: 108/66 (06/17 1943) Pulse Rate: 85 (06/17 1943)  Labs: Recent Labs    02/17/19 1304 02/17/19 1800  02/18/19 0103 02/18/19 0657 02/18/19 1436 02/19/19 0005  HGB 18.4*  --   --   --  16.0  --  15.9  HCT 55.2*  --   --   --  50.9  --  50.7  PLT 157  --   --   --  155  --  142*  APTT  --  26  --  59*  --   --   --   LABPROT  --  14.3  --   --   --   --   --   INR  --  1.1  --   --   --   --   --   HEPARINUNFRC  --   --    < > 0.33 0.15* 0.67 <0.10*  CREATININE 0.73  --   --  0.81  --   --   --    < > = values in this interval not displayed.    Estimated Creatinine Clearance: 145.6 mL/min (by C-G formula based on SCr of 0.81 mg/dL).   Medical History: Past Medical History:  Diagnosis Date  . Hx of blood clots   . Sleep apnea     Medications:  Home anticoagulant Xarelto 20mg  qd with supper - last dose 6/15 @ 1800  Assessment: Venous US shows occlusive thrombus noted in the popliteal vein and peroneal veins Baseline HL was < 0.10 indicating patient may not have been taking his Xarelto, which he confirms in conversation (forgot). H&H, PLT wnl  Goal of Therapy:  Heparin level 0.3-0.7 units/ml  Monitor platelets by anticoagulation protocol: Yes   Heparin Course: 6/16 initiation: 2000 unit bolus, then 1700 units/hr 6/17 0103 HL 0.33, aPTT 59s: no change 6/17 0657 HL 0.15: bolus 3000 units, then increase rate to 2100 units/hr 6/17 1436 HL 0.67   Plan:  06/18 @ 0000 HL < 0.10 confirmed w/ RN that drip has been running since 1800 w/o interruptions. Will rebolus w/ heparin 3000 units IV x 1 and increase rate to 2300 units/hr and will recheck HL/CBC @ 0700.  Tobie Lords, PharmD Clinical Pharmacist 02/19/2019 1:36 AM

## 2019-02-19 NOTE — Discharge Instructions (Signed)
Vascular Surgery Discharge Instructions 1) You may shower as of tomorrow. Keep groins clean and dry. 2) Elevate your lower extremities heart level or higher as much as possible.

## 2019-02-19 NOTE — Progress Notes (Signed)
[facility] Daily Progress Note   Subjective: 1 Day Post-Op: 1.   Ultrasound guidance for vascular access to the left vein 2.   Catheter placement into the inferior vena cava 3.   Inferior venacavogram 4.   Placement of a [place] IVC filter  No issues overnight.  Was unable to complete right lower extremity lysis due to severe hypoxia during procedure.  Patient without complaint this a.m.  Objective: Vitals:   02/18/19 1823 02/18/19 1943 02/19/19 0606 02/19/19 0915  BP: 112/72 108/66 102/71   Pulse: 76 85 77   Resp: 16 18 20    Temp: 99.3 F (37.4 C) 99.6 F (37.6 C) 98.7 F (37.1 C)   TempSrc: Oral Oral Oral   SpO2: 97% 97% 93% (!) 88%  Weight:      Height:        Intake/Output Summary (Last 24 hours) at 02/19/2019 1012 Last data filed at 02/19/2019 0900 Gross per 24 hour  Intake 751.41 ml  Output 2200 ml  Net -1448.59 ml   Physical Exam: A&Ox3, NAD CV: RRR Pulmonary: CTA Bilaterally Abdomen: Soft, Nontender, Nondistended Left Groin: Access site, clean and dry Vascular:  Right Lower Extremity: Mild - moderate edema. Warm distally to toes.  Left Lower Extremity: Moderate edema. Warm distally to toes.    Laboratory: CBC    Component Value Date/Time   WBC 10.0 02/19/2019 0736   HGB 15.8 02/19/2019 0736   HGB 17.8 (H) 09/10/2018 0930   HCT 49.9 02/19/2019 0736   HCT 51.3 (H) 09/10/2018 0930   PLT 143 (L) 02/19/2019 0736   PLT 233 09/10/2018 0930   BMET    Component Value Date/Time   NA 140 02/18/2019 0103   NA 143 09/10/2018 0930   K 4.3 02/18/2019 0103   CL 100 02/18/2019 0103   CO2 33 (H) 02/18/2019 0103   GLUCOSE 131 (H) 02/18/2019 0103   BUN 8 02/18/2019 0103   BUN 8 09/10/2018 0930   CREATININE 0.81 02/18/2019 0103   CALCIUM 8.7 (L) 02/18/2019 0103   GFRNONAA >60 02/18/2019 0103   GFRAA >60 02/18/2019 0103   Assessment/Planning: The patient is a 48 year old male with a past medical history of sleep apnea, current everyday  tobacco abuse, obesity, history of left lower extremity DVT status post venous lysis and IVC filter insertion and subsequent removal on Xarelto who presents to the [facility] emergency department with a chief complaint impressively worsening right leg pain found to have 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  Unable to do right lower extremity venous lysis due to hypoxemia during procedure. 2) seems that the patient has been noncompliant with taking his Xarelto.  He has been offered assistance in paying for his Xarelto as well as having a new prescription delivered to the bedside.  The patient has refused both. 3) patient encouraged to wear compression socks and elevate his bilateral legs heart level or higher as much as possible 4) okay to discharge from a vascular standpoint.  Discussed with Dr. Eber Hong Chantella Creech PA-C 02/19/2019 10:12 AM

## 2019-02-19 NOTE — Discharge Summary (Signed)
[place] [facility] [place] NAME: Matthew [name]    MR#:  [id number]  DATE OF BIRTH:  1971/09/03  DATE OF ADMISSION:  02/17/2019   ADMITTING PHYSICIAN: Fritzi Mandes, MD  DATE OF DISCHARGE:  02/19/19  PRIMARY CARE PHYSICIAN: Patient, No Pcp Per   ADMISSION DIAGNOSIS:   Acute deep vein thrombosis (DVT) of popliteal vein of right lower extremity (HCC) [I82.431]  DISCHARGE DIAGNOSIS:   Active Problems:   Acute DVT (deep venous thrombosis) ([place])   SECONDARY DIAGNOSIS:   Past Medical History:  Diagnosis Date  . Hx of blood clots   . Sleep [name]     HOSPITAL COURSE:   Matthew [name],  Matthew [name], Matthew [name], Matthew [name], Matthew has been noncompliant with his Xarelto comes back with right leg swelling.  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  Supposed to have filled it 10 days ago at medication management clinic, and still has several Xarelto left at home-going through personal problems and not taking medication regularly. -Appreciate vascular consult.    Received IV heparin drip in the hospital. -Patient had IVC filter placed this time and might need it permanently. -Restarted on Xarelto now.  Again on treatment dose, explained the difference.  Started on 15 mg twice daily for 3 weeks and then will transition to daily dosing.  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 -patient will be resume back on his Xarelto after his procedure-also educated about the compliance to the medication.  However patient does not seem very interested  3.Obstructive sleep  [name]/morbid Matthew [name]-patient had a sleep study in May 2018 showing severe sleep [name] and was recommended to have bilevel ventilatory machine with pressures of 18/14 and 2 L supplemental oxygen. -Patient has been noncompliant and not using it anyway. -His CT angiogram of the lung showing possible elevated right heart pressures and pulmonary hypertension. -Lifestyle changes recommended  4.Chronic Matthew [name]- advised smoking cessation  5.  Depression-Lexapro  Stable otherwise and discharge  home today   DISCHARGE CONDITIONS:   Guarded  CONSULTS OBTAINED:   Treatment Team:  Katha Cabal, MD  DRUG ALLERGIES:   No Known Allergies DISCHARGE MEDICATIONS:   Allergies as of 02/19/2019   No Known Allergies     Medication List    STOP taking these medications   rivaroxaban 20 MG Tabs tablet Commonly known as: XARELTO Replaced by: Rivaroxaban 15 & 20 MG Tbpk     TAKE these medications   albuterol 1.25 MG/3ML nebulizer solution Commonly known as: ACCUNEB Take 1 ampule by nebulization every 6 (six) hours as needed for wheezing.   escitalopram 10 MG tablet Commonly known as: LEXAPRO Take 1 tablet (10 mg total) by mouth daily for 30 days. What changed:   when to take this  reasons to take this   furosemide 20 MG tablet Commonly known as: LASIX Take 1 tablet (20 mg total) by mouth daily.   Rivaroxaban 15 & 20 MG Tbpk Take as directed on package: Start with one 15mg  tablet by mouth twice a day with food. On Day 22, switch to one 20mg  tablet once a day with food. Replaces: rivaroxaban 20 MG Tabs tablet  triamcinolone cream 0.1 % Commonly known as: KENALOG Apply 1 application topically 2 (two) times daily.        DISCHARGE INSTRUCTIONS:   1. [facility] 2. Vascular f/u in 2-3 weeks  DIET:   Cardiac diet  ACTIVITY:   Activity as tolerated  OXYGEN:   Home Oxygen: No.  Oxygen Delivery: room air  DISCHARGE LOCATION:    home   If you experience worsening of your admission symptoms, develop shortness of breath, life threatening emergency, suicidal or homicidal thoughts you must seek medical attention immediately by calling 911 or calling your MD immediately  if symptoms less severe.  You Must read complete instructions/literature along with all the possible adverse reactions/side effects for all the Medicines you take and that have been prescribed to you. Take any new Medicines after you have completely understood and accpet all the possible adverse reactions/side effects.   Please note  You were cared for by a hospitalist during your hospital stay. If you have any questions about your discharge medications or the care you received while you were in the hospital after you are discharged, you can call the unit and asked to speak with the hospitalist on call if the hospitalist that took care of you is not available. Once you are discharged, your primary care physician will handle any further medical issues. Please note that NO REFILLS for any discharge medications will be authorized once you are discharged, as it is imperative that you return to your primary care physician (or establish a relationship with a primary care physician if you do not have one) for your aftercare needs so that they can reassess your need for medications and monitor your lab values.    On the day of Discharge:  VITAL SIGNS:   Blood pressure 102/71, pulse 77, temperature 98.7 F (37.1 C), temperature source Oral, resp. rate 20, height 5\' 7"  (1.702 m), weight 131.6 kg, SpO2 93 %.  PHYSICAL EXAMINATION:   GENERAL:  48 y.o.-year-old patient lying in the bed with no acute distress.  EYES: Pupils equal, round, reactive to light and accommodation. No scleral icterus. Extraocular muscles intact.  HEENT: Head atraumatic, normocephalic. Oropharynx and nasopharynx clear.  NECK:  Supple, no jugular venous distention. No thyroid enlargement, no  tenderness.  LUNGS: Normal breath sounds bilaterally, no wheezing, rales,rhonchi or crepitation. No use of accessory muscles of respiration.  CARDIOVASCULAR: S1, S2 normal. No murmurs, rubs, or gallops.  ABDOMEN: Soft, nontender, nondistended. Bowel sounds present. No organomegaly or mass.  EXTREMITIES: Right greater than left lower extremity swelling.  Erythema of the right leg noted as well.Marland Kitchen.  NEUROLOGIC: Cranial nerves II through XII are intact. Muscle strength 5/5 in all extremities. Sensation intact. Gait not checked.  PSYCHIATRIC: The patient is alert and oriented x 3. SKIN: No obvious rash, lesion, or ulcer.   DATA REVIEW:   CBC [facility]  Lab 02/19/19 0736  WBC 10.0  HGB 15.8  HCT 49.9  PLT 143*    Chemistries  [facility]  Lab 02/18/19 0103  NA 140  K 4.3  CL 100  CO2 33*  GLUCOSE 131*  BUN 8  CREATININE 0.81  CALCIUM 8.7*  MG 1.9     Microbiology Results  Results for orders placed or performed during the hospital encounter of 02/17/19  Surgical PCR screen     Status: Abnormal   Collection Time: 02/17/19  9:53 PM   Specimen: Nasopharyngeal Swab; Nasal Swab  Result  Value Ref Range Status   MRSA, PCR NEGATIVE NEGATIVE Final   Staphylococcus aureus POSITIVE (A) NEGATIVE Final    Comment: (NOTE) The Xpert SA Assay (FDA approved for NASAL specimens in patients 49 years of age and older), is one component of a comprehensive surveillance program. It is not intended to diagnose infection nor to guide or monitor treatment. Performed at [facility], [address] [place], [place] [address]   SARS Coronavirus 2 (CEPHEID - Performed in [facility] hospital lab), Hosp Order     Status: None   Collection Time: 02/17/19  9:54 PM   Specimen: Nasopharyngeal Swab  Result Value Ref Range Status   SARS Coronavirus 2 NEGATIVE NEGATIVE Final    Comment: (NOTE) If result is NEGATIVE SARS-CoV-2 target nucleic acids are NOT DETECTED. The SARS-CoV-2 RNA is  generally detectable in upper and lower  respiratory specimens during the acute phase of infection. The lowest  concentration of SARS-CoV-2 viral copies this assay can detect is 250  copies / mL. A negative result does not preclude SARS-CoV-2 infection  and should not be used as the sole basis for treatment or other  patient management decisions.  A negative result may occur with  improper specimen collection / handling, submission of specimen other  than nasopharyngeal swab, presence of viral mutation(s) within the  areas targeted by this assay, and inadequate number of viral copies  (<250 copies / mL). A negative result must be combined with clinical  observations, patient history, and epidemiological information. If result is POSITIVE SARS-CoV-2 target nucleic acids are DETECTED. The SARS-CoV-2 RNA is generally detectable in upper and lower  respiratory specimens dur ing the acute phase of infection.  Positive  results are indicative of active infection with SARS-CoV-2.  Clinical  correlation with patient history and other diagnostic information is  necessary to determine patient infection status.  Positive results do  not rule out bacterial infection or co-infection with other viruses. If result is PRESUMPTIVE POSTIVE SARS-CoV-2 nucleic acids MAY BE PRESENT.   A presumptive positive result was obtained on the submitted specimen  and confirmed on repeat testing.  While 2019 novel coronavirus  (SARS-CoV-2) nucleic acids may be present in the submitted sample  additional confirmatory testing may be necessary for epidemiological  and / or clinical management purposes  to differentiate between  SARS-CoV-2 and other Sarbecovirus currently known to infect humans.  If clinically indicated additional testing with an alternate test  methodology [phone]([id number]) is advised. The SARS-CoV-2 RNA is generally  detectable in upper and lower respiratory sp ecimens during the acute  phase of infection.  The expected result is Negative. Fact Sheet for Patients:  BoilerBrush.com.cyhttps://www.fda.gov/media/136312/download Fact Sheet for Healthcare Providers: https://pope.com/https://www.fda.gov/media/136313/download This test is not yet approved or cleared by the [place] FDA and has been authorized for detection and/or diagnosis of SARS-CoV-2 by FDA under an Emergency Use Authorization (EUA).  This EUA will remain in effect (meaning this test can be used) for the duration of the COVID-19 declaration under Section 564(b)(1) of the Act, 21 U.S.C. section 360bbb-3(b)(1), unless the authorization is terminated or revoked sooner. Performed at [facility], [address] [place], [place] [address]     RADIOLOGY:  No results found.   Management plans discussed with the patient, family and they are in agreement.  CODE STATUS:     Code Status Orders  (From admission, onward)         Start     Ordered   02/17/19  2014  Full code  Continuous     02/17/19 2013        Code Status History    Date Active Date Inactive Code Status Order ID Comments User Context   11/19/2018 2234 11/20/2018 1643 Full Code [id number]  Oralia ManisWillis, David, MD Inpatient   Advance Care Planning Activity      TOTAL TIME TAKING CARE OF THIS PATIENT: 39 minutes.    Enid Baasadhika Mialani Reicks M.D on 02/19/2019 at 8:52 AM  Between 7am to 6pm - Pager - [phone]  After 6pm go to [email or web address] - Social research officer, governmentpassword [email or web address]  [facility]  Office  [phone]  CC: Primary care physician; Patient, No Pcp Per   Note: This dictation was prepared with Dragon dictation along with smaller phrase technology. Any transcriptional errors that result from this process are unintentional.

## 2019-02-20 ENCOUNTER — Encounter: Payer: Self-pay | Admitting: Vascular Surgery

## 2019-02-20 ENCOUNTER — Other Ambulatory Visit: Payer: Self-pay | Admitting: Gerontology

## 2019-02-20 DIAGNOSIS — R7303 Prediabetes: Secondary | ICD-10-CM

## 2019-02-24 ENCOUNTER — Other Ambulatory Visit: Payer: Self-pay

## 2019-02-25 ENCOUNTER — Other Ambulatory Visit: Payer: Self-pay

## 2019-03-04 ENCOUNTER — Emergency Department: Payer: Self-pay

## 2019-03-04 ENCOUNTER — Other Ambulatory Visit: Payer: Self-pay

## 2019-03-04 ENCOUNTER — Other Ambulatory Visit (INDEPENDENT_AMBULATORY_CARE_PROVIDER_SITE_OTHER): Payer: Self-pay | Admitting: Vascular Surgery

## 2019-03-04 ENCOUNTER — Emergency Department
Admission: EM | Admit: 2019-03-04 | Discharge: 2019-03-04 | Disposition: A | Discharge status: Home / Self Care | Payer: Self-pay | Attending: Emergency Medicine | Admitting: Emergency Medicine

## 2019-03-04 ENCOUNTER — Encounter: Payer: Self-pay | Admitting: *Deleted

## 2019-03-04 DIAGNOSIS — Z5321 Procedure and treatment not carried out due to patient leaving prior to being seen by health care provider: Secondary | ICD-10-CM | POA: Insufficient documentation

## 2019-03-04 DIAGNOSIS — Z86718 Personal history of other venous thrombosis and embolism: Secondary | ICD-10-CM

## 2019-03-04 DIAGNOSIS — R609 Edema, unspecified: Secondary | ICD-10-CM | POA: Insufficient documentation

## 2019-03-04 LAB — BASIC METABOLIC PANEL
Anion gap: 11 (ref 5–15)
BUN: 14 mg/dL (ref 6–20)
CO2: 29 mmol/L (ref 22–32)
Calcium: 8.9 mg/dL (ref 8.9–10.3)
Chloride: 102 mmol/L (ref 98–111)
Creatinine, Ser: 1.01 mg/dL (ref 0.61–1.24)
GFR calc Af Amer: >60 mL/min (ref >60)
GFR calc non Af Amer: >60 mL/min (ref >60)
Glucose, Bld: 96 mg/dL (ref 70–99)
Potassium: 5.2 mmol/L — ABNORMAL HIGH (ref 3.5–5.1)
Sodium: 142 mmol/L (ref 135–145)

## 2019-03-04 LAB — CBC
HCT: 53.9 % — ABNORMAL HIGH (ref 39.0–52.0)
Hemoglobin: 18.1 g/dL — ABNORMAL HIGH (ref 13.0–17.0)
MCH: 32.8 pg (ref 26.0–34.0)
MCHC: 33.6 g/dL (ref 30.0–36.0)
MCV: 97.8 fL (ref 80.0–100.0)
Platelets: 204 10*3/uL (ref 150–400)
RBC: 5.51 MIL/uL (ref 4.22–5.81)
RDW: 15.1 % (ref 11.5–15.5)
WBC: 13.1 10*3/uL — ABNORMAL HIGH (ref 4.0–10.5)
nRBC: 0 % (ref 0.0–0.2)

## 2019-03-04 LAB — PROTIME-INR
INR: 1.1 (ref 0.8–1.2)
Prothrombin Time: 14.3 seconds (ref 11.4–15.2)

## 2019-03-04 NOTE — ED Notes (Signed)
When this tech called pt for EKG, pt stated "I only came here for one thing because my leg is leaking water. This is ridiculous. If yall order one more test I am leaving and going home and continue to wrap my leg like I have been doing." Pt made aware that EKG was ordered after blood results came back. Pt then stated "there is nothing wrong with my blood results." EKG was preformed and pt gave a urine sample in case one is needed. Urine sent to the lab.

## 2019-03-04 NOTE — ED Notes (Signed)
Pt told STAT desk that he was going to call his pcp in the morning because he was tired of waiting. Encouraged to come back if feeling worse. Verbalized understanding.

## 2019-03-04 NOTE — ED Triage Notes (Signed)
Pt ambulatory to triage. Pt has swelling and pain in left lower leg.  Pt has 2 sores with drainage of left lower leg.  Pt has hx dvt each leg and PE.  cig smoker.  Pt on xarelto.  Pt alert  Speech clear.

## 2019-03-05 ENCOUNTER — Encounter (INDEPENDENT_AMBULATORY_CARE_PROVIDER_SITE_OTHER): Payer: Self-pay

## 2019-03-05 ENCOUNTER — Telehealth: Payer: Self-pay | Admitting: Emergency Medicine

## 2019-03-05 ENCOUNTER — Ambulatory Visit (INDEPENDENT_AMBULATORY_CARE_PROVIDER_SITE_OTHER): Payer: Self-pay | Admitting: Nurse Practitioner

## 2019-03-05 NOTE — Telephone Encounter (Signed)
Called patient due to lwot to inquire about condition and follow up plans. Patients number did not work.  I called patricia klipstien and she says she is not with the patient. I asked her to have him call the Emergency department.  She agrees to do that.  We would like to let him know that he has a left sided DVT and it is very important that he is taking his xarelto.  He has an ivc filter in place, but he is welcome to return if he is concerned with the leg or it is bothering him.

## 2019-03-10 ENCOUNTER — Ambulatory Visit: Payer: Self-pay | Admitting: Gerontology

## 2019-03-10 ENCOUNTER — Emergency Department: Payer: Self-pay

## 2019-03-10 ENCOUNTER — Other Ambulatory Visit: Payer: Self-pay

## 2019-03-10 ENCOUNTER — Encounter: Payer: Self-pay | Admitting: Emergency Medicine

## 2019-03-10 ENCOUNTER — Inpatient Hospital Stay
Admission: EM | Admit: 2019-03-10 | Discharge: 2019-03-14 | DRG: 193 | Disposition: A | Discharge status: Home / Self Care | Payer: Self-pay | Attending: Internal Medicine | Admitting: Internal Medicine

## 2019-03-10 DIAGNOSIS — Z79899 Other long term (current) drug therapy: Secondary | ICD-10-CM

## 2019-03-10 DIAGNOSIS — R0902 Hypoxemia: Secondary | ICD-10-CM

## 2019-03-10 DIAGNOSIS — J189 Pneumonia, unspecified organism: Principal | ICD-10-CM | POA: Diagnosis present

## 2019-03-10 DIAGNOSIS — J9601 Acute respiratory failure with hypoxia: Secondary | ICD-10-CM | POA: Diagnosis present

## 2019-03-10 DIAGNOSIS — E662 Morbid (severe) obesity with alveolar hypoventilation: Secondary | ICD-10-CM | POA: Diagnosis present

## 2019-03-10 DIAGNOSIS — Z86711 Personal history of pulmonary embolism: Secondary | ICD-10-CM

## 2019-03-10 DIAGNOSIS — Z86718 Personal history of other venous thrombosis and embolism: Secondary | ICD-10-CM

## 2019-03-10 DIAGNOSIS — D751 Secondary polycythemia: Secondary | ICD-10-CM | POA: Diagnosis present

## 2019-03-10 DIAGNOSIS — I272 Pulmonary hypertension, unspecified: Secondary | ICD-10-CM | POA: Diagnosis present

## 2019-03-10 DIAGNOSIS — F17210 Nicotine dependence, cigarettes, uncomplicated: Secondary | ICD-10-CM | POA: Diagnosis present

## 2019-03-10 DIAGNOSIS — Z7901 Long term (current) use of anticoagulants: Secondary | ICD-10-CM

## 2019-03-10 DIAGNOSIS — R609 Edema, unspecified: Secondary | ICD-10-CM

## 2019-03-10 DIAGNOSIS — I248 Other forms of acute ischemic heart disease: Secondary | ICD-10-CM | POA: Diagnosis present

## 2019-03-10 DIAGNOSIS — I4891 Unspecified atrial fibrillation: Secondary | ICD-10-CM | POA: Diagnosis present

## 2019-03-10 DIAGNOSIS — Z6841 Body Mass Index (BMI) 40.0 and over, adult: Secondary | ICD-10-CM

## 2019-03-10 DIAGNOSIS — Z20828 Contact with and (suspected) exposure to other viral communicable diseases: Secondary | ICD-10-CM | POA: Diagnosis present

## 2019-03-10 DIAGNOSIS — R600 Localized edema: Secondary | ICD-10-CM | POA: Diagnosis present

## 2019-03-10 LAB — HEPATIC FUNCTION PANEL
ALT: 14 U/L (ref 0–44)
AST: 18 U/L (ref 15–41)
Albumin: 3 g/dL — ABNORMAL LOW (ref 3.5–5.0)
Alkaline Phosphatase: 123 U/L (ref 38–126)
Bilirubin, Direct: 0.1 mg/dL (ref 0.0–0.2)
Indirect Bilirubin: 0.4 mg/dL (ref 0.3–0.9)
Total Bilirubin: 0.5 mg/dL (ref 0.3–1.2)
Total Protein: 7.4 g/dL (ref 6.5–8.1)

## 2019-03-10 LAB — BASIC METABOLIC PANEL
Anion gap: 5 (ref 5–15)
BUN: 10 mg/dL (ref 6–20)
CO2: 32 mmol/L (ref 22–32)
Calcium: 8.6 mg/dL — ABNORMAL LOW (ref 8.9–10.3)
Chloride: 100 mmol/L (ref 98–111)
Creatinine, Ser: 0.75 mg/dL (ref 0.61–1.24)
GFR calc Af Amer: >60 mL/min (ref >60)
GFR calc non Af Amer: >60 mL/min (ref >60)
Glucose, Bld: 113 mg/dL — ABNORMAL HIGH (ref 70–99)
Potassium: 4.4 mmol/L (ref 3.5–5.1)
Sodium: 137 mmol/L (ref 135–145)

## 2019-03-10 LAB — CBC
HCT: 52.7 % — ABNORMAL HIGH (ref 39.0–52.0)
Hemoglobin: 17.4 g/dL — ABNORMAL HIGH (ref 13.0–17.0)
MCH: 32.9 pg (ref 26.0–34.0)
MCHC: 33 g/dL (ref 30.0–36.0)
MCV: 99.6 fL (ref 80.0–100.0)
Platelets: 193 10*3/uL (ref 150–400)
RBC: 5.29 MIL/uL (ref 4.22–5.81)
RDW: 15.2 % (ref 11.5–15.5)
WBC: 11.8 10*3/uL — ABNORMAL HIGH (ref 4.0–10.5)
nRBC: 0 % (ref 0.0–0.2)

## 2019-03-10 LAB — TROPONIN I (HIGH SENSITIVITY)
Troponin I (High Sensitivity): 29 ng/L — ABNORMAL HIGH (ref <18)
Troponin I (High Sensitivity): 29 ng/L — ABNORMAL HIGH (ref <18)

## 2019-03-10 LAB — BRAIN NATRIURETIC PEPTIDE: B Natriuretic Peptide: 41 pg/mL (ref 0.0–100.0)

## 2019-03-10 LAB — SARS CORONAVIRUS 2 BY RT PCR (HOSPITAL ORDER, PERFORMED IN ~~LOC~~ HOSPITAL LAB): SARS Coronavirus 2: NEGATIVE

## 2019-03-10 MED ORDER — VANCOMYCIN HCL IN DEXTROSE 1-5 GM/200ML-% IV SOLN
1000.0000 mg | Freq: Once | INTRAVENOUS | Status: AC
  Administered 2019-03-10: 1000 mg via INTRAVENOUS
  Filled 2019-03-10: qty 200

## 2019-03-10 MED ORDER — DILTIAZEM HCL 100 MG IV SOLR
5.0000 mg/h | INTRAVENOUS | Status: DC
  Administered 2019-03-10: 5 mg/h via INTRAVENOUS
  Filled 2019-03-10: qty 100

## 2019-03-10 MED ORDER — IOPAMIDOL (ISOVUE-370) INJECTION 76%
100.0000 mL | Freq: Once | INTRAVENOUS | Status: AC | PRN
  Administered 2019-03-10: 100 mL via INTRAVENOUS

## 2019-03-10 MED ORDER — MORPHINE SULFATE (PF) 4 MG/ML IV SOLN
8.0000 mg | Freq: Once | INTRAVENOUS | Status: AC
  Administered 2019-03-10: 8 mg via INTRAVENOUS
  Filled 2019-03-10: qty 2

## 2019-03-10 MED ORDER — SODIUM CHLORIDE 0.9% FLUSH: 3.0000 mL | Freq: Once | INTRAVENOUS | Status: DC

## 2019-03-10 MED ORDER — IPRATROPIUM-ALBUTEROL 0.5-2.5 (3) MG/3ML IN SOLN
3.0000 mL | Freq: Once | RESPIRATORY_TRACT | Status: AC
  Administered 2019-03-10: 3 mL via RESPIRATORY_TRACT
  Filled 2019-03-10: qty 3

## 2019-03-10 MED ORDER — DILTIAZEM HCL 25 MG/5ML IV SOLN
10.0000 mg | Freq: Once | INTRAVENOUS | Status: AC
  Administered 2019-03-10: 10 mg via INTRAVENOUS
  Filled 2019-03-10: qty 5

## 2019-03-10 MED ORDER — SODIUM CHLORIDE 0.9 % IV SOLN
2.0000 g | Freq: Once | INTRAVENOUS | Status: AC
  Administered 2019-03-10: 2 g via INTRAVENOUS
  Filled 2019-03-10: qty 2

## 2019-03-10 MED ORDER — LORAZEPAM 2 MG/ML IJ SOLN
1.0000 mg | Freq: Once | INTRAMUSCULAR | Status: AC
  Administered 2019-03-10: 1 mg via INTRAVENOUS
  Filled 2019-03-10: qty 1

## 2019-03-10 MED ORDER — DILTIAZEM LOAD VIA INFUSION
10.0000 mg | Freq: Once | INTRAVENOUS | Status: AC
  Administered 2019-03-10: 10 mg via INTRAVENOUS
  Filled 2019-03-10: qty 10

## 2019-03-10 NOTE — ED Notes (Signed)
Patient with no oxygen on, sating 76% on room air, went into AFIB. Md to bedside. Non rebreather placed. sats up to 94%. Ekg obtained and given to provider. Awaiting ct angio r/o pe. Safety maintained will monitor.

## 2019-03-10 NOTE — ED Provider Notes (Signed)
[facility] Emergency Department Provider Note  ____________________________________________   First MD Initiated Contact with Patient 03/10/19 1848     (approximate)  I have reviewed the triage vital signs and the nursing notes.   HISTORY  Chief Complaint Shortness of Breath and Cough    HPI Matthew [name] is a 48 y.o. 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    Patient states that over the last 2 days, he has had significant worsening of chronic shortness of breath.  States that he went to a wedding on Saturday.  The next morning, he began coughing and has since had persistent, hacking, dry cough.  He said associated significant shortness of breath and is having difficulty just getting around the house.  He has had increasing swelling in his legs as well, and right thigh pain, which is new.  He states he thought that he had stepped into a pothole during the wedding on Saturday when he was drinking, which is what he attributes the pain to.  He states that over the last day, his symptoms are worsening he essentially is unable to get around the house without getting lightheaded and short of breath.  He has had extreme fatigue.  He denies any fevers.  He has not had any chills.  Denies any sputum production.  Of note, he has a history of PE.  He states he has been adherent with his Xarelto.  No other acute complaints.       Past Medical History:  Diagnosis Date  . Hx of blood clots   . Sleep apnea     Patient Active Problem List   Diagnosis Date Noted  . Acute DVT (deep venous thrombosis) (HCC) 02/17/2019  . DVT of lower extremity (deep venous thrombosis) (HCC) 11/19/2018    Past Surgical History:  Procedure Laterality Date  . IVC FILTER INSERTION Left 11/24/2018   Procedure: IVC FILTER INSERTION;  Surgeon: Annice Needyew, Jason S, MD;  Location: [facility] INVASIVE CV LAB;  Service: Cardiovascular;  Laterality: Left;  . IVC FILTER INSERTION Right 02/18/2019   Procedure: IVC  FILTER INSERTION WITH RIGHT LOWER EXTREMITY VENOUS LYSIS;  Surgeon: Renford DillsSchnier, Gregory G, MD;  Location: [facility] INVASIVE CV LAB;  Service: Cardiovascular;  Laterality: Right;  . IVC FILTER REMOVAL N/A 12/01/2018   Procedure: IVC FILTER REMOVAL;  Surgeon: Annice Needyew, Jason S, MD;  Location: [facility] INVASIVE CV LAB;  Service: Cardiovascular;  Laterality: N/A;  . NO PAST SURGERIES      Prior to Admission medications   Medication Sig Start Date End Date Taking? Authorizing Provider  rivaroxaban (XARELTO) 20 MG TABS tablet Take 20 mg by mouth daily with supper.   Yes [provider]  albuterol (ACCUNEB) 1.25 MG/3ML nebulizer solution Take 1 ampule by nebulization every 6 (six) hours as needed for wheezing.    [provider]  escitalopram (LEXAPRO) 10 MG tablet Take 1 tablet (10 mg total) by mouth daily for 30 days. Patient taking differently: Take 10 mg by mouth daily as needed (irritability).  01/09/19 02/17/19  Iloabachie, Chioma E, NP  furosemide (LASIX) 20 MG tablet Take 1 tablet (20 mg total) by mouth daily. Patient not taking: Reported on 03/10/2019 12/03/18   Iloabachie, Chioma E, NP  triamcinolone cream (KENALOG) 0.1 % Apply 1 application topically 2 (two) times daily. 02/04/19   Iloabachie, Chioma E, NP    Allergies Patient has no known allergies.  No family history on file.  Social History Social History   Tobacco Use  .  Smoking status: Current Every Day Smoker    Packs/day: 0.50    Types: Cigarettes  . Smokeless tobacco: Never Used  Substance Use Topics  . Alcohol use: Yes    Comment: occassional  . Drug use: Never    Review of Systems  Review of Systems  Constitutional: Positive for fatigue. Negative for chills and fever.  HENT: Negative for sore throat.   Respiratory: Positive for cough, chest tightness and shortness of breath.   Cardiovascular: Negative for chest pain.  Gastrointestinal: Negative for abdominal pain.  Genitourinary: Negative for flank pain.   Musculoskeletal: Negative for neck pain.  Skin: Negative for rash and wound.  Allergic/Immunologic: Negative for immunocompromised state.  Neurological: Positive for weakness. Negative for numbness.  Hematological: Does not bruise/bleed easily.     ____________________________________________  PHYSICAL EXAM:      VITAL SIGNS: ED Triage Vitals  Enc Vitals Group     BP 03/10/19 1443 107/71     Pulse Rate 03/10/19 1443 83     Resp 03/10/19 1443 (!) 28     Temp 03/10/19 1443 99.3 F (37.4 C)     Temp Source 03/10/19 1443 Oral     SpO2 03/10/19 1443 90 %     Weight --      Height --      Head Circumference --      Peak Flow --      Pain Score 03/10/19 1813 0     Pain Loc --      Pain Edu? --      Excl. in GC? --      Physical Exam Vitals signs and nursing note reviewed.  Constitutional:      General: He is not in acute distress.    Appearance: He is well-developed.  HENT:     Head: Normocephalic and atraumatic.  Eyes:     Conjunctiva/sclera: Conjunctivae normal.  Neck:     Musculoskeletal: Neck supple.  Cardiovascular:     Rate and Rhythm: Tachycardia present. Rhythm irregular.     Heart sounds: Normal heart sounds. No murmur. No friction rub.  Pulmonary:     Effort: Pulmonary effort is normal. No respiratory distress.     Breath sounds: Examination of the right-upper field reveals wheezing. Examination of the left-upper field reveals wheezing. Examination of the right-middle field reveals wheezing. Examination of the left-middle field reveals wheezing. Examination of the right-lower field reveals wheezing and rales. Examination of the left-lower field reveals wheezing and rales. Wheezing and rales present.  Abdominal:     General: There is no distension.     Palpations: Abdomen is soft.     Tenderness: There is no abdominal tenderness.  Musculoskeletal:     Right lower leg: Edema (3+ pitting) present.     Left lower leg: Edema (3+ pitting) present.  Skin:     General: Skin is warm.     Capillary Refill: Capillary refill takes less than 2 seconds.     Comments: Mild tenderness along varicosity on the medial thigh. Minimal erythema, no induration. No fluctuance.  Neurological:     Mental Status: He is alert and oriented to person, place, and time.     Motor: No abnormal muscle tone.       ____________________________________________   LABS (all labs ordered are listed, but only abnormal results are displayed)  Labs Reviewed  BASIC METABOLIC PANEL - Abnormal; Notable for the following components:      Result Value   Glucose, Bld 113 (*)  Calcium 8.6 (*)    All other components within normal limits  CBC - Abnormal; Notable for the following components:   WBC 11.8 (*)    Hemoglobin 17.4 (*)    HCT 52.7 (*)    All other components within normal limits  TROPONIN I (HIGH SENSITIVITY) - Abnormal; Notable for the following components:   Troponin I (High Sensitivity) 29 (*)    All other components within normal limits  TROPONIN I (HIGH SENSITIVITY) - Abnormal; Notable for the following components:   Troponin I (High Sensitivity) 29 (*)    All other components within normal limits  HEPATIC FUNCTION PANEL - Abnormal; Notable for the following components:   Albumin 3.0 (*)    All other components within normal limits  SARS CORONAVIRUS 2 (HOSPITAL ORDER, PERFORMED IN [facility])  BRAIN NATRIURETIC PEPTIDE    ____________________________________________  EKG: Atrial fibrillation, ventricular rate 132 with rapid response.  Right ventricular hypertrophy noted.  Nonspecific ST changes, likely rate related.  Compared to previous, atrial fibrillation is new. ________________________________________  RADIOLOGY All imaging, including plain films, CT scans, and ultrasounds, independently reviewed by me, and interpretations confirmed via formal radiology reads.  ED MD interpretation:   Chest x-ray: Clear, no focal abnormality   Official radiology report(s): Dg Chest 2 View  Result Date: 03/10/2019 CLINICAL DATA:  Shortness of breath EXAM: CHEST - 2 VIEW COMPARISON:  Chest x-ray July 28, 2018.  Chest CT February 17, 2019 FINDINGS: Cardiomediastinal silhouette is unchanged. Cardiomegaly. No pneumothorax. Mild atelectasis in the right base. No suspicious infiltrates. No overt edema. IMPRESSION: No active cardiopulmonary disease. Electronically Signed   By: Gerome Samavid  Williams III M.D   On: 03/10/2019 16:49    ____________________________________________  PROCEDURES   Procedure(s) performed (including Critical Care):  .Critical Care Performed by: Shaune PollackIsaacs, Lacreshia Bondarenko, MD Authorized by: Shaune PollackIsaacs, Ajooni Karam, MD   Critical care provider statement:    Critical care time (minutes):  45   Critical care time was exclusive of:  Separately billable procedures and treating other patients and teaching time   Critical care was necessary to treat or prevent imminent or life-threatening deterioration of the following conditions:  Cardiac failure, circulatory failure and respiratory failure   Critical care was time spent personally by me on the following activities:  Development of treatment plan with patient or surrogate, discussions with consultants, evaluation of patient's response to treatment, examination of patient, obtaining history from patient or surrogate, ordering and performing treatments and interventions, ordering and review of laboratory studies, ordering and review of radiographic studies, pulse oximetry, re-evaluation of patient's condition and review of old charts   I assumed direction of critical care for this patient from another provider in my specialty: no      ____________________________________________  INITIAL IMPRESSION / MDM / ASSESSMENT AND PLAN / ED COURSE  As part of my medical decision making, I reviewed the following data within the electronic MEDICAL RECORD NUMBER Notes from prior ED visits and [place] Controlled  Substance Database      *Matthew CrankerKelly Booher was evaluated in Emergency Department on 03/10/2019 for the symptoms described in the history of present illness. He was evaluated in the context of the global COVID-19 pandemic, which necessitated consideration that the patient might be at risk for infection with the SARS-CoV-2 virus that causes COVID-19. Institutional protocols and algorithms that pertain to the evaluation of patients at risk for COVID-19 are in a state of rapid change based on information released by regulatory bodies including the CDC  and federal and state organizations. These policies and algorithms were followed during the patient's care in the ED.  Some ED evaluations and interventions may be delayed as a result of limited staffing during the pandemic.*      Medical Decision Making: 48 year old male with extensive history of DVT/PE on anticoagulation here with shortness of breath.  On arrival, the patient is significantly hypoxic, desatted into the 70s on room air.  He is also in new onset atrial fibrillation.  Differential includes possible CHF, symptomatic A. fib with hypoxia, also must consider PE.  Patient also was at a wedding, so will check a coronavirus.  Plan for CT angios given his history of extensive PE and new onset hypoxia, with subsequent admission.  Patient placed on a diltiazem drip for his new onset A. fib RVR.  He is already anticoagulated. Pt updated and in agreement.  ____________________________________________  FINAL CLINICAL IMPRESSION(S) / ED DIAGNOSES  Final diagnoses:  Acute respiratory failure with hypoxia ([place])  New onset atrial fibrillation (HCC)     MEDICATIONS GIVEN DURING THIS VISIT:  Medications  sodium chloride flush (NS) 0.9 % injection 3 mL (3 mLs Intravenous Not Given 03/10/19 1815)  diltiazem (CARDIZEM) 1 mg/mL load via infusion 10 mg (has no administration in time range)    And  diltiazem (CARDIZEM) 100 mg in dextrose 5 % 100 mL (1 mg/mL)  infusion (has no administration in time range)  ipratropium-albuterol (DUONEB) 0.5-2.5 (3) MG/3ML nebulizer solution 3 mL (3 mLs Nebulization Given 03/10/19 1948)  morphine 4 MG/ML injection 8 mg (8 mg Intravenous Given 03/10/19 1947)  LORazepam (ATIVAN) injection 1 mg (1 mg Intravenous Given 03/10/19 2057)  diltiazem (CARDIZEM) injection 10 mg (10 mg Intravenous Given 03/10/19 2056)     ED Discharge Orders    None       Note:  This document was prepared using Dragon voice recognition software and may include unintentional dictation errors.   Duffy Bruce, MD 03/10/19 2124

## 2019-03-10 NOTE — ED Triage Notes (Signed)
Patient presents to the ED via EMS from home.  Patient was 90%/room air per EMS, EMS put patient on 4L and his O2 went up to 97%.  Patient has had cough, shortness of breath and subsequent chest pain since Sunday afternoon.  Patient reports green sputum.  Patient was at a large gathering on Saturday (Wedding with approx. 90 persons).  Patient states he was prescribed oxygen but has not used it in over 1 year.  Patient is in no obvious distress at this time.

## 2019-03-10 NOTE — ED Notes (Signed)
Pt questioning "why are you putting an IV in me you know I need one." This tech explained to pt that we do not put IVs in everyone. Pt stated "I don't like to be stuck more than once. I bet if I show my fucking ass they wont stick me no more."

## 2019-03-10 NOTE — ED Notes (Signed)
Pt O2 90% RA. Pt placed on 2L O2 via [place]. Anna,RN aware.

## 2019-03-10 NOTE — ED Provider Notes (Signed)
-----------------------------------------   11:37 PM on 03/10/2019 -----------------------------------------  I took over care on this patient from Dr. Ellender Hose.  CT shows no evidence of acute PE, however there is evidence of possible pneumonia.  Since the patient was recently admitted I initiated antibiotics for HCAP.  I signed the patient out to the hospitalist for admission.   Arta Silence, MD 03/10/19 [phone]

## 2019-03-11 ENCOUNTER — Inpatient Hospital Stay
Admit: 2019-03-11 | Discharge: 2019-03-11 | Disposition: A | Discharge status: Home / Self Care | Payer: Self-pay | Attending: Nurse Practitioner | Admitting: Nurse Practitioner

## 2019-03-11 ENCOUNTER — Inpatient Hospital Stay: Payer: Self-pay

## 2019-03-11 DIAGNOSIS — J189 Pneumonia, unspecified organism: Secondary | ICD-10-CM | POA: Diagnosis present

## 2019-03-11 DIAGNOSIS — R0600 Dyspnea, unspecified: Secondary | ICD-10-CM

## 2019-03-11 LAB — ECHOCARDIOGRAM COMPLETE
Height: 67 in
Weight: 4486.4 oz

## 2019-03-11 LAB — STREP PNEUMONIAE URINARY ANTIGEN: Strep Pneumo Urinary Antigen: NEGATIVE

## 2019-03-11 LAB — MRSA PCR SCREENING: MRSA by PCR: NEGATIVE

## 2019-03-11 MED ORDER — SODIUM CHLORIDE 0.9% FLUSH: 3.0000 mL | INTRAVENOUS | Status: DC | PRN

## 2019-03-11 MED ORDER — DILTIAZEM HCL ER COATED BEADS 120 MG PO CP24
120.0000 mg | ORAL_CAPSULE | Freq: Every day | ORAL | Status: DC
  Administered 2019-03-11 – 2019-03-14 (×4): 120 mg via ORAL
  Filled 2019-03-11 (×6): qty 1

## 2019-03-11 MED ORDER — PERFLUTREN LIPID MICROSPHERE
1.0000 mL | INTRAVENOUS | Status: AC | PRN
  Administered 2019-03-11: 3 mL via INTRAVENOUS
  Filled 2019-03-11: qty 10

## 2019-03-11 MED ORDER — SODIUM CHLORIDE 0.9 % IV SOLN
500.0000 mg | INTRAVENOUS | Status: DC
  Administered 2019-03-11 – 2019-03-12 (×2): 500 mg via INTRAVENOUS
  Filled 2019-03-11 (×3): qty 500

## 2019-03-11 MED ORDER — SODIUM CHLORIDE 0.9% FLUSH
3.0000 mL | Freq: Two times a day (BID) | INTRAVENOUS | Status: DC
  Administered 2019-03-11 – 2019-03-13 (×7): 3 mL via INTRAVENOUS

## 2019-03-11 MED ORDER — SODIUM CHLORIDE 0.9 % IV SOLN
2.0000 g | INTRAVENOUS | Status: DC
  Administered 2019-03-11 – 2019-03-12 (×2): 2 g via INTRAVENOUS
  Filled 2019-03-11: qty 2
  Filled 2019-03-11: qty 20
  Filled 2019-03-11: qty 2

## 2019-03-11 MED ORDER — IPRATROPIUM-ALBUTEROL 0.5-2.5 (3) MG/3ML IN SOLN: 3.0000 mL | Freq: Four times a day (QID) | RESPIRATORY_TRACT | Status: DC | PRN

## 2019-03-11 MED ORDER — RIVAROXABAN 20 MG PO TABS
20.0000 mg | ORAL_TABLET | Freq: Every day | ORAL | Status: DC
  Administered 2019-03-11 – 2019-03-13 (×3): 20 mg via ORAL
  Filled 2019-03-11 (×3): qty 1

## 2019-03-11 MED ORDER — SODIUM CHLORIDE 0.9 % IV SOLN
250.0000 mL | INTRAVENOUS | Status: DC | PRN
  Administered 2019-03-11 – 2019-03-12 (×2): 250 mL via INTRAVENOUS

## 2019-03-11 NOTE — Consult Note (Signed)
[facility] Consultation [facility]  Patient ID: Matthew [name], MRN: [id number], DOB/AGE: 49/02/1971 49 y.o. Admit date: 03/10/2019   Date of Consult: 03/11/2019 Primary Physician: Patient, No Pcp Per Primary Cardiologist: None  Chief Complaint:  Chief Complaint  Patient presents with  . Shortness of Breath  . Cough   Reason for Consult: Shortness of breath  HPI: 48 y.o. male with known recurrent deep venous thrombosis tobacco abuse sleep apnea with acute onset of severe shortness of breath weakness and fatigue cough and congestion.  The patient has had this progressive symptoms over the last few days.  Chest x-ray does not appear to have any primary changes or congestive heart failure but concerning for possible pneumonia.  The patient has had misstep with his anticoagulation and it does appear that the left lower extremity is significantly swollen consistent with process probable repeat deep venous thrombosis.  There is no current evidence of pulmonary embolism although he has a IVC filter which may be helping.  The patient has a significant amount of tobacco abuse with a high hemoglobin as well he has not been using his CPAP machine well.  Currently he is hemodynamically stable.  Troponin appears to be 29 most consistent with demand ischemia and hypoxia rather than acute coronary syndrome  Past Medical History:  Diagnosis Date  . Hx of blood clots   . Sleep apnea       Surgical History:  Past Surgical History:  Procedure Laterality Date  . IVC FILTER INSERTION Left 11/24/2018   Procedure: IVC FILTER INSERTION;  Surgeon: Annice Needyew, Jason S, MD;  Location: [facility] INVASIVE CV LAB;  Service: Cardiovascular;  Laterality: Left;  . IVC FILTER INSERTION Right 02/18/2019   Procedure: IVC FILTER INSERTION WITH RIGHT LOWER EXTREMITY VENOUS LYSIS;  Surgeon: Renford DillsSchnier, Gregory G, MD;  Location: [facility] INVASIVE CV LAB;  Service: Cardiovascular;  Laterality: Right;  . IVC FILTER REMOVAL N/A 12/01/2018    Procedure: IVC FILTER REMOVAL;  Surgeon: Annice Needyew, Jason S, MD;  Location: [facility] INVASIVE CV LAB;  Service: Cardiovascular;  Laterality: N/A;  . NO PAST SURGERIES       Home Meds: Prior to Admission medications   Medication Sig Start Date End Date Taking? Authorizing Provider  rivaroxaban (XARELTO) 20 MG TABS tablet Take 20 mg by mouth daily with supper.   Yes [provider]  albuterol (ACCUNEB) 1.25 MG/3ML nebulizer solution Take 1 ampule by nebulization every 6 (six) hours as needed for wheezing.    [provider]  escitalopram (LEXAPRO) 10 MG tablet Take 1 tablet (10 mg total) by mouth daily for 30 days. Patient taking differently: Take 10 mg by mouth daily as needed (irritability).  01/09/19 02/17/19  Iloabachie, Chioma E, NP  furosemide (LASIX) 20 MG tablet Take 1 tablet (20 mg total) by mouth daily. Patient not taking: Reported on 03/10/2019 12/03/18   Iloabachie, Chioma E, NP  triamcinolone cream (KENALOG) 0.1 % Apply 1 application topically 2 (two) times daily. 02/04/19   Iloabachie, Chioma E, NP    Inpatient Medications:  . rivaroxaban  20 mg Oral Q supper  . sodium chloride flush  3 mL Intravenous Once  . sodium chloride flush  3 mL Intravenous Q12H   . sodium chloride 250 mL (03/11/19 0548)  . azithromycin 500 mg (03/11/19 0640)  . cefTRIAXone (ROCEPHIN)  IV 2 g (03/11/19 0551)  . diltiazem (CARDIZEM) infusion 5 mg/hr (03/10/19 2155)    Allergies: No Known Allergies  Social History   Socioeconomic History  .  Marital status: Married    Spouse name: Elease Hashimoto  . Number of children: 4  . Years of education: Not on file  . Highest education level: Not on file  Occupational History  . Not on file  Social Needs  . Financial resource strain: Not hard at all  . Food insecurity    Worry: Never true    Inability: Never true  . Transportation needs    Medical: No    Non-medical: No  Tobacco Use  . Smoking status: Current Every Day Smoker    Packs/day: 0.50     Types: Cigarettes  . Smokeless tobacco: Never Used  Substance and Sexual Activity  . Alcohol use: Yes    Comment: occassional  . Drug use: Never  . Sexual activity: Yes  Lifestyle  . Physical activity    Days per week: Not on file    Minutes per session: Not on file  . Stress: Only a little  Relationships  . Social connections    Talks on phone: More than three times a week    Gets together: Not on file    Attends religious service: Not on file    Active member of club or organization: Not on file    Attends meetings of clubs or organizations: Not on file    Relationship status: Not on file  . Intimate partner violence    Fear of current or ex partner: No    Emotionally abused: No    Physically abused: No    Forced sexual activity: No  Other Topics Concern  . Not on file  Social History Narrative  . Not on file     No family history on file.   Review of Systems Positive for shortness of breath Negative for: General:  chills, fever, night sweats or weight changes.  Cardiovascular: PND orthopnea syncope dizziness  Dermatological skin lesions rashes Respiratory: Cough congestion Urologic: Frequent urination urination at night and hematuria Abdominal: negative for nausea, vomiting, diarrhea, bright red blood per rectum, melena, or hematemesis Neurologic: negative for visual changes, and/or hearing changes  All other systems reviewed and are otherwise negative except as noted above.  Labs: No results for input(s): CKTOTAL, CKMB, TROPONINI in the last 72 hours. Lab Results  Component Value Date   WBC 11.8 (H) 03/10/2019   HGB 17.4 (H) 03/10/2019   HCT 52.7 (H) 03/10/2019   MCV 99.6 03/10/2019   PLT 193 03/10/2019    Recent Labs  Lab 03/10/19 1451 03/10/19 1940  NA 137  --   K 4.4  --   CL 100  --   CO2 32  --   BUN 10  --   CREATININE 0.75  --   CALCIUM 8.6*  --   PROT  --  7.4  BILITOT  --  0.5  ALKPHOS  --  123  ALT  --  14  AST  --  18  GLUCOSE 113*   --    Lab Results  Component Value Date   CHOL 155 09/10/2018   HDL 38 (L) 09/10/2018   LDLCALC 84 09/10/2018   TRIG 163 (H) 09/10/2018   No results found for: DDIMER  Radiology/Studies:  Dg Chest 2 View  Result Date: 03/10/2019 CLINICAL DATA:  Shortness of breath EXAM: CHEST - 2 VIEW COMPARISON:  Chest x-ray July 28, 2018.  Chest CT February 17, 2019 FINDINGS: Cardiomediastinal silhouette is unchanged. Cardiomegaly. No pneumothorax. Mild atelectasis in the right base. No suspicious infiltrates. No overt edema.  IMPRESSION: No active cardiopulmonary disease. Electronically Signed   By: Dorise Bullion III M.D   On: 03/10/2019 16:49   Ct Angio Chest Pe W And/or Wo Contrast  Result Date: 03/10/2019 CLINICAL DATA:  Acute on chronic shortness of breath. History of pulmonary embolus. PE suspected, high pretest prob EXAM: CT ANGIOGRAPHY CHEST WITH CONTRAST TECHNIQUE: Multidetector CT imaging of the chest was performed using the standard protocol during bolus administration of intravenous contrast. Multiplanar CT image reconstructions and MIPs were obtained to evaluate the vascular anatomy. CONTRAST:  171mL ISOVUE-370 IOPAMIDOL (ISOVUE-370) INJECTION 76% COMPARISON:  Chest radiograph earlier this day. Most recent prior CT 3 weeks ago 02/17/2019 FINDINGS: Cardiovascular: No acute filling defects in the pulmonary arteries. Improving chronic nonocclusive clot in the right interlobar pulmonary artery and chronic web in the right lower lobe pulmonary artery detailed evaluation of the lung bases obscured by breathing motion artifact. Chronically dilated main pulmonary artery of 4.9 cm. Multi chamber cardiomegaly. Chronic straightening of the intraventricular septum and right heart dilatation. Progressive contrast reflux into the hepatic veins and IVC. Mediastinum/Nodes: Small left hilar lymph nodes abutting the main pulmonary artery, likely reactive. Additional small paratracheal nodes, not enlarged by size  criteria. No thyroid nodule. Esophagus is decompressed. Lungs/Pleura: Breathing motion artifact limits assessment. New scattered heterogeneous and nodular opacities in the right greater than left lung, for example image 62 series 6 in the right lower lobe, image 40 series 6 in the right upper lobe. Left apical nodular opacities, image 18 series 6. No definite septal thickening. No pleural fluid. Chronic scarring in the periphery of the right middle lobe. Upper Abdomen: Chronic high-density in the gastric cardia, possible debris within the gastric diverticulum, unchanged from prior exams. Known gallstone. Known right adrenal myelolipoma. Musculoskeletal: There are no acute or suspicious osseous abnormalities. Review of the MIP images confirms the above findings. IMPRESSION: 1. No acute pulmonary embolus. 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 2. Progressive contrast refluxing into the hepatic veins and IVC consistent with increasing right heart dysfunction. Chronic pulmonary arterial hypertension. Multi chamber cardiomegaly. 3. Scattered heterogeneous and nodular opacities in both lungs, likely infectious. Electronically Signed   By: Keith Rake M.D.   On: 03/10/2019 22:31   Ct Angio Chest Pe W And/or Wo Contrast  Result Date: 02/17/2019 CLINICAL DATA:  Hypoxia.  New right lower extremity DVT. EXAM: CT ANGIOGRAPHY CHEST WITH CONTRAST TECHNIQUE: Multidetector CT imaging of the chest was performed using the standard protocol during bolus administration of intravenous contrast. Multiplanar CT image reconstructions and MIPs were obtained to evaluate the vascular anatomy. CONTRAST:  77mL OMNIPAQUE IOHEXOL 350 MG/ML SOLN COMPARISON:  CTA chest dated November 19, 2018. FINDINGS: Cardiovascular: Satisfactory opacification of the pulmonary arteries to the segmental level. No evidence of acute pulmonary embolism. Interval decrease in size of the peripheral nonocclusive  thrombus in the right interlobar pulmonary artery with unchanged small web like defects at the right lower lobe segmental pulmonary artery origins. Unchanged severe dilatation of the main pulmonary artery with mild cardiomegaly, right heart enlargement, and reflux of contrast into the intrahepatic veins. No pericardial effusion. No thoracic aortic aneurysm. Mediastinum/Nodes: No enlarged mediastinal, hilar, or axillary lymph nodes. Thyroid gland, trachea, and esophagus demonstrate no significant findings. Lungs/Pleura: No focal consolidation, pleural effusion, or pneumothorax. Unchanged scarring in the right middle lobe. No suspicious pulmonary nodule. Upper Abdomen: No acute abnormality. Unchanged cholelithiasis and small right adrenal myelolipoma. Musculoskeletal: No chest wall abnormality. No acute or significant osseous findings.  Review of the MIP images confirms the above findings. IMPRESSION: 1.  No evidence of acute pulmonary embolism. 2. 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 3. Unchanged pulmonary arterial hypertension and right heart dysfunction. Electronically Signed   By: Obie DredgeWilliam T Derry M.D.   On: 02/17/2019 18:03   [place] Venous Img Lower Unilateral Left  Result Date: 03/04/2019 CLINICAL DATA:  Swelling and pain EXAM: Left LOWER EXTREMITY VENOUS DOPPLER ULTRASOUND TECHNIQUE: Gray-scale sonography with graded compression, as well as color Doppler and duplex ultrasound were performed to evaluate the lower extremity deep venous systems from the level of the common femoral vein and including the common femoral, femoral, profunda femoral, popliteal and calf veins including the posterior tibial, peroneal and gastrocnemius veins when visible. The superficial great saphenous vein was also interrogated. Spectral Doppler was utilized to evaluate flow at rest and  with distal augmentation maneuvers in the common femoral, femoral and popliteal veins. COMPARISON:  November 19, 2018 FINDINGS: Contralateral Common Femoral Vein: Respiratory phasicity is normal and symmetric with the symptomatic side. No evidence of thrombus. Normal compressibility. Common Femoral Vein: No evidence of thrombus. Normal compressibility, respiratory phasicity and response to augmentation. Saphenofemoral Junction: No evidence of thrombus. Normal compressibility and flow on color Doppler imaging. Profunda Femoral Vein: No evidence of thrombus. Normal compressibility and flow on color Doppler imaging. Femoral Vein: There is nonocclusive thrombus throughout the femoral vein. The thrombus appears to be located in an eccentric position, favoring a chronic thrombus. There is occlusive thrombus in the duplicated femoral vein in the mid and distal portions. Popliteal Vein: No evidence of thrombus. Normal compressibility, respiratory phasicity and response to augmentation. Calf Veins: No evidence of thrombus. Normal compressibility and flow on color Doppler imaging. The peroneal vein was not visualized. Superficial Great Saphenous Vein: There is thrombus within the great saphenous vein. The thrombus is occlusive in the distal portion. Venous Reflux:  None. Other Findings:  There is lower extremity edema. IMPRESSION: 1. Chronic appearing nonocclusive thrombus throughout the left femoral vein. 2. Occlusive and near occlusive thrombus throughout the left great saphenous vein, a superficial vein of the lower extremity. 3. Nonvisualization of the peroneal vein. Chronic occlusion cannot be excluded. Electronically Signed   By: Katherine Mantlehristopher  Green M.D.   On: 03/04/2019 19:11   [place] Venous Img Lower Unilateral Right  Result Date: 02/17/2019 CLINICAL DATA:  Right leg pain.  History of DVT left leg. EXAM: RIGHT LOWER EXTREMITY VENOUS DOPPLER ULTRASOUND TECHNIQUE: Gray-scale sonography with graded compression, as well as  color Doppler and duplex ultrasound were performed to evaluate the lower extremity deep venous systems from the level of the common femoral vein and including the common femoral, femoral, profunda femoral, popliteal and calf veins including the posterior tibial, peroneal and gastrocnemius veins when visible. The superficial great saphenous vein was also interrogated. Spectral Doppler was utilized to evaluate flow at rest and with distal augmentation maneuvers in the common femoral, femoral and popliteal veins. COMPARISON:  Left lower extremity color flow duplex Doppler 11/19/2018. FINDINGS: Contralateral Common Femoral Vein: Respiratory phasicity is normal and symmetric with the symptomatic side. No evidence of thrombus. Normal compressibility. Common Femoral Vein: No evidence of thrombus. Normal compressibility, respiratory phasicity and response to augmentation. Saphenofemoral Junction: No evidence of thrombus. Normal compressibility and flow on color Doppler imaging. Profunda Femoral Vein: No evidence of thrombus. Normal compressibility and flow on color Doppler imaging. Femoral  Vein: No evidence of thrombus. Normal compressibility, respiratory phasicity and response to augmentation. Popliteal Vein: Occlusive thrombus noted in the popliteal vein. Calf Veins: Occlusive thrombus noted in the peroneal veins. Superficial Great Saphenous Vein: No evidence of thrombus. Normal compressibility. Other Findings:  None. IMPRESSION: Occlusive thrombus noted in the popliteal vein and peroneal veins. Electronically Signed   By: Maisie Fushomas  Register   On: 02/17/2019 14:30    EKG: Normal sinus rhythm with left axis deviation right bundle branch block  Weights: Filed Weights   03/11/19 0236  Weight: 127.2 kg     Physical Exam: Blood pressure 94/61, pulse 88, temperature 99.2 F (37.3 C), temperature source Oral, resp. rate 16, height 5\' 7"  (1.702 m), weight 127.2 kg, SpO2 94 %. Body mass index is 43.92 kg/m. General:  Well developed, well nourished, in no acute distress. Head eyes ears nose throat: Normocephalic, atraumatic, sclera non-icteric, no xanthomas, nares are without discharge. No apparent thyromegaly and/or mass  Lungs: Normal respiratory effort.  Few wheezes, no rales, no rhonchi.  Heart: RRR with normal S1 S2. no murmur gallop, no rub, PMI is normal size and placement, carotid upstroke normal without bruit, jugular venous pressure is normal Abdomen: Soft, non-tender, n distended this arding. No obvious abdominal masses. Abdominal aorta is normal size without bruit Extremities: N 2+ edema. no cyanosis, no clubbing, no ulcers  Peripheral : 2+ bilateral upper extremity pulses, 0 + bilateral femoral pulses, 0 + bilateral dorsal pedal pulse Neuro: Alert and oriented. No facial asymmetry. No focal deficit. Moves all extremities spontaneously. Musculoskeletal: Normal muscle tone without kyphosis Psych:  Responds to questions appropriately with a normal affect.    Assessment: 48 year old male with sleep apnea tobacco abuse shortness of breath edema cough congestion most consistent with recurrent deep venous thrombosis with elevated troponin consistent with demand ischemia from hypoxia and current condition rather than acute coronary syndrome  Plan: 1.  Continue anticoagulation for further risk reduction and deep venous thrombosis and pulmonary embolism 2.  Continue oxygenation with CPAP machine for significant sleep apnea hypoxia 3.  Patient was counseled on the discontinuation of tobacco use 4.  Echocardiogram for right heart strain and other cardiovascular concerns 5.  Further supportive care of lower extremity edema including compression hose and diuretics 6.  Further adjustments of medication management as per above  Signed, Lamar BlinksBruce J Marguarite Markov M.D. [facility] 03/11/2019, 9:11 AM

## 2019-03-11 NOTE — Progress Notes (Signed)
*  PRELIMINARY RESULTS* Echocardiogram 2D Echocardiogram has been performed.  Matthew [name] 03/11/2019, 9:08 AM

## 2019-03-11 NOTE — Progress Notes (Addendum)
Patient was transferred from the ER following c/o acute respiratory distress. On admission patient was A&O X4, denied being in any pain , ambulatory with unsteady gait. Patient was on Diltiazem infusion d/t ongoing afib and on acute 4L of supplemental oxygen Patient was oriented to his room and care plan was discussed . Bed alarm was activated and cardiac monitor started.

## 2019-03-11 NOTE — Progress Notes (Signed)
[employer] - [place] [facility]   PATIENT NAME: Matthew [name]    MR#:  [id number]  DATE OF BIRTH:  10/01/1970  SUBJECTIVE:  CHIEF COMPLAINT:   Chief Complaint  Patient presents with  . Shortness of Breath  . Cough   Came with cough and fever with palpitation and tachycardia. Noted to have A. fib with RVR and after starting Cardizem drip heart rate is under control.  Started on antibiotic treatment for pneumonia.  REVIEW OF SYSTEMS:  CONSTITUTIONAL: No fever, fatigue or weakness.  EYES: No blurred or double vision.  EARS, NOSE, AND THROAT: No tinnitus or ear pain.  RESPIRATORY: No cough, shortness of breath, wheezing or hemoptysis.  CARDIOVASCULAR: No chest pain, orthopnea, edema.  GASTROINTESTINAL: No nausea, vomiting, diarrhea or abdominal pain.  GENITOURINARY: No dysuria, hematuria.  ENDOCRINE: No polyuria, nocturia,  HEMATOLOGY: No anemia, easy bruising or bleeding SKIN: No rash or lesion. MUSCULOSKELETAL: No joint pain or arthritis.   NEUROLOGIC: No tingling, numbness, weakness.  PSYCHIATRY: No anxiety or depression.   ROS  DRUG ALLERGIES:  No Known Allergies  VITALS:  Blood pressure 94/61, pulse 88, temperature 99.2 F (37.3 C), temperature source Oral, resp. rate 16, height 5\' 7"  (1.702 m), weight 127.2 kg, SpO2 94 %.  PHYSICAL EXAMINATION:  GENERAL:  48 y.o.-year-old patient lying in the bed with no acute distress.  EYES: Pupils equal, round, reactive to light and accommodation. No scleral icterus. Extraocular muscles intact.  HEENT: Head atraumatic, normocephalic. Oropharynx and nasopharynx clear.  NECK:  Supple, no jugular venous distention. No thyroid enlargement, no tenderness.  LUNGS: Normal breath sounds bilaterally, no wheezing, have some crepitation. No use of accessory muscles of respiration.  CARDIOVASCULAR: S1, S2 normal. No murmurs, rubs, or gallops.  ABDOMEN: Soft, nontender, nondistended. Bowel sounds present. No organomegaly  or mass.  EXTREMITIES: No pedal edema, cyanosis, or clubbing.  Bilateral leg swelling more than on left. NEUROLOGIC: Cranial nerves II through XII are intact. Muscle strength 4/5 in all extremities. Sensation intact. Gait not checked.  PSYCHIATRIC: The patient is alert and oriented x 3.  SKIN: No obvious rash, lesion, or ulcer.   Physical Exam LABORATORY PANEL:   CBC Recent Labs  Lab 03/10/19 1451  WBC 11.8*  HGB 17.4*  HCT 52.7*  PLT 193   ------------------------------------------------------------------------------------------------------------------  Chemistries  Recent Labs  Lab 03/10/19 1451 03/10/19 1940  NA 137  --   K 4.4  --   CL 100  --   CO2 32  --   GLUCOSE 113*  --   BUN 10  --   CREATININE 0.75  --   CALCIUM 8.6*  --   AST  --  18  ALT  --  14  ALKPHOS  --  123  BILITOT  --  0.5   ------------------------------------------------------------------------------------------------------------------  Cardiac Enzymes No results for input(s): TROPONINI in the last 168 hours. ------------------------------------------------------------------------------------------------------------------  RADIOLOGY:  Dg Chest 2 View  Result Date: 03/10/2019 CLINICAL DATA:  Shortness of breath EXAM: CHEST - 2 VIEW COMPARISON:  Chest x-ray July 28, 2018.  Chest CT February 17, 2019 FINDINGS: Cardiomediastinal silhouette is unchanged. Cardiomegaly. No pneumothorax. Mild atelectasis in the right base. No suspicious infiltrates. No overt edema. IMPRESSION: No active cardiopulmonary disease. Electronically Signed   By: Gerome Samavid  Williams III M.D   On: 03/10/2019 16:49   Ct Angio Chest Pe W And/or Wo Contrast  Result Date: 03/10/2019 CLINICAL DATA:  Acute on chronic shortness of breath. History of pulmonary embolus. PE  suspected, high pretest prob EXAM: CT ANGIOGRAPHY CHEST WITH CONTRAST TECHNIQUE: Multidetector CT imaging of the chest was performed using the standard protocol during  bolus administration of intravenous contrast. Multiplanar CT image reconstructions and MIPs were obtained to evaluate the vascular anatomy. CONTRAST:  130mL ISOVUE-370 IOPAMIDOL (ISOVUE-370) INJECTION 76% COMPARISON:  Chest radiograph earlier this day. Most recent prior CT 3 weeks ago 02/17/2019 FINDINGS: Cardiovascular: No acute filling defects in the pulmonary arteries. Improving chronic nonocclusive clot in the right interlobar pulmonary artery and chronic web in the right lower lobe pulmonary artery detailed evaluation of the lung bases obscured by breathing motion artifact. Chronically dilated main pulmonary artery of 4.9 cm. Multi chamber cardiomegaly. Chronic straightening of the intraventricular septum and right heart dilatation. Progressive contrast reflux into the hepatic veins and IVC. Mediastinum/Nodes: Small left hilar lymph nodes abutting the main pulmonary artery, likely reactive. Additional small paratracheal nodes, not enlarged by size criteria. No thyroid nodule. Esophagus is decompressed. Lungs/Pleura: Breathing motion artifact limits assessment. New scattered heterogeneous and nodular opacities in the right greater than left lung, for example image 62 series 6 in the right lower lobe, image 40 series 6 in the right upper lobe. Left apical nodular opacities, image 18 series 6. No definite septal thickening. No pleural fluid. Chronic scarring in the periphery of the right middle lobe. Upper Abdomen: Chronic high-density in the gastric cardia, possible debris within the gastric diverticulum, unchanged from prior exams. Known gallstone. Known right adrenal myelolipoma. Musculoskeletal: There are no acute or suspicious osseous abnormalities. Review of the MIP images confirms the above findings. IMPRESSION: 1. No acute pulmonary embolus. 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 2. Progressive contrast refluxing into the hepatic veins and IVC consistent with  increasing right heart dysfunction. Chronic pulmonary arterial hypertension. Multi chamber cardiomegaly. 3. Scattered heterogeneous and nodular opacities in both lungs, likely infectious. Electronically Signed   By: Keith Rake M.D.   On: 03/10/2019 22:31   US Venous Img Lower Bilateral  Result Date: 03/11/2019 CLINICAL DATA:  History of prior DVT with persistent bilateral lower extremity edema. Evaluate for acute or chronic DVT. EXAM: BILATERAL LOWER EXTREMITY VENOUS DOPPLER ULTRASOUND TECHNIQUE: Gray-scale sonography with graded compression, as well as color Doppler and duplex ultrasound were performed to evaluate the lower extremity deep venous systems from the level of the common femoral vein and including the common femoral, femoral, profunda femoral, popliteal and calf veins including the posterior tibial, peroneal and gastrocnemius veins when visible. The superficial great saphenous vein was also interrogated. Spectral Doppler was utilized to evaluate flow at rest and with distal augmentation maneuvers in the common femoral, femoral and popliteal veins. COMPARISON:  Left lower extremity venous Doppler ultrasound-03/04/2019; 11/19/2018 FINDINGS: RIGHT LOWER EXTREMITY Common Femoral Vein: No evidence of thrombus. Normal compressibility, respiratory phasicity and response to augmentation. Saphenofemoral Junction: No evidence of thrombus. Normal compressibility and flow on color Doppler imaging. Profunda Femoral Vein: No evidence of thrombus. Normal compressibility and flow on color Doppler imaging. Femoral Vein: No evidence of thrombus. Normal compressibility, respiratory phasicity and response to augmentation. Popliteal Vein: No evidence of thrombus. Normal compressibility, respiratory phasicity and response to augmentation. Calf Veins: Appears patent where imaged. Superficial Great Saphenous Vein: No evidence of thrombus. Normal compressibility. Venous Reflux:  None. Other Findings:  None. LEFT LOWER  EXTREMITY Common Femoral Vein: No evidence of thrombus. Normal compressibility, respiratory phasicity and response to augmentation. Saphenofemoral Junction: No evidence of thrombus. Normal compressibility and flow on color Doppler imaging.  Profunda Femoral Vein: No evidence of thrombus. Normal compressibility and flow on color Doppler imaging. Femoral Vein: No evidence of thrombus. Normal compressibility, respiratory phasicity and response to augmentation. Popliteal Vein: No evidence of thrombus. Normal compressibility, respiratory phasicity and response to augmentation. Calf Veins: Appears patent where imaged. Superficial Great Saphenous Vein: Not imaged on the present examination. Venous Reflux:  None. Other Findings:  None. IMPRESSION: 1. No definite evidence of acute or chronic DVT within either lower extremity with special attention paid to the left femoral vein. 2. Left greater saphenous vein was not imaged the present examination though was noted to contain occlusive superficial thrombophlebitis on the 03/04/2019 examination. Electronically Signed   By: Simonne ComeJohn  Watts M.D.   On: 03/11/2019 14:19    ASSESSMENT AND PLAN:   Active Problems:   Bilateral pneumonia  1.  New onset atrial fibrillation with RVR - Continue Xarelto - Continue Cardizem infusion as started in the emergency room-now converted to sinus rhythm, I will switch to oral long-acting Cardizem and taper the IV drip off. - Echocardiogram-ejection fraction 60% with slight dilation of the right side. - Trend troponin levels- stable. - Cardiology consulted for further evaluation and recommendations -Telemetry monitoring  2.  Bilateral pneumonia - IV Rocephin and azithromycin - O2 per nasal cannula - DuoNeb every 6 as needed - Sputum culture - Blood culture  3. 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  Obstructive sleep apnea - CPAP nightly and  during hours of sleep  5.  Tobacco abuse - We have discussed the importance of tobacco cessation.  DVT and PPI prophylaxis initiated    All the records are reviewed and case discussed with Care Management/Social Workerr. Management plans discussed with the patient, family and they are in agreement.  CODE STATUS: Full.  TOTAL TIME TAKING CARE OF THIS PATIENT: 35 minutes.     POSSIBLE D/C IN 1-2 DAYS, DEPENDING ON CLINICAL CONDITION.   Altamese DillingVaibhavkumar Coen Miyasato M.D on 03/11/2019   Between 7am to 6pm - Pager - [phone]  After 6pm go to [email or web address] - password [email or web address]  [facility] [place] [facility]  Office  [phone]  CC: Primary care physician; Patient, No Pcp Per  Note: This dictation was prepared with Dragon dictation along with smaller phrase technology. Any transcriptional errors that result from this process are unintentional.

## 2019-03-11 NOTE — H&P (Signed)
[place] at [place] NAME: Matthew [name]    MR#:  [id number]  DATE OF BIRTH:  [birth year]  DATE OF ADMISSION:  03/10/2019  PRIMARY CARE PHYSICIAN: Patient, No Pcp Per   REQUESTING/REFERRING PHYSICIAN: Duffy Bruce, MD  CHIEF COMPLAINT:   Chief Complaint  Patient presents with   Shortness of Breath   Cough    HISTORY OF PRESENT ILLNESS:  Matthew [name]  is a 48 y.o. 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  Patient also has a history of continued tobacco abuse.  He is currently taking Xarelto.  He presents to the emergency room reporting a 2-day history of increased shortness of breath and persistent nonproductive cough.  Symptoms are made worse when lying flat.  He is short of breath with minimal exertion such as walking from room to room in his home.  He has also noticed increased edema of his bilateral lower extremities particularly worse in his right thigh.  He denies experiencing fevers, chills, nausea, vomiting, hematemesis, hematochezia, melena, abdominal pain, diarrhea, or chest pain.  BNP is 41.  EKG demonstrates atrial fibrillation with RVR at rate of 132.  Atrial fibrillation is new when compared to previous EKG.  Patient was started on diltiazem infusion with continued Xarelto in the emergency room.  Chest x-ray demonstrates no acute pulmonary embolus.  There is slight decrease in chronic nonocclusive clot in the right interlobar pulmonary artery.  There is also progressive contrast refluxing into the hepatic veins and IVC consistent with increasing right heart dysfunction.  There are scattered heterogenous and nodular opacities in both lungs likely infectious.  He has been admitted to the hospitalist service for further management.  PAST MEDICAL HISTORY:   Past Medical History:  Diagnosis Date   Hx of blood clots    Sleep apnea     PAST  SURGICAL HISTORY:   Past Surgical History:  Procedure Laterality Date   IVC FILTER INSERTION Left 11/24/2018   Procedure: IVC FILTER INSERTION;  Surgeon: Algernon Huxley, MD;  Location: [facility];  Service: Cardiovascular;  Laterality: Left;   IVC FILTER INSERTION Right 02/18/2019   Procedure: IVC FILTER INSERTION WITH RIGHT LOWER EXTREMITY VENOUS LYSIS;  Surgeon: Katha Cabal, MD;  Location: [facility];  Service: Cardiovascular;  Laterality: Right;   IVC FILTER REMOVAL N/A 12/01/2018   Procedure: IVC FILTER REMOVAL;  Surgeon: Algernon Huxley, MD;  Location: [facility];  Service: Cardiovascular;  Laterality: N/A;   NO PAST SURGERIES      SOCIAL HISTORY:   Social History   Tobacco Use   Smoking status: Current Every Day Smoker    Packs/day: 0.50    Types: Cigarettes   Smokeless tobacco: Never Used  Substance Use Topics   Alcohol use: Yes    Comment: occassional    FAMILY HISTORY:  No family history on file.  DRUG ALLERGIES:  No Known Allergies  REVIEW OF SYSTEMS:   Review of Systems  Constitutional: Negative for chills, diaphoresis, fever, malaise/fatigue and weight loss.  HENT: Negative for congestion, sinus pain and sore throat.   Eyes: Negative for blurred vision and double vision.  Respiratory: Positive for cough, sputum production, shortness of breath and wheezing.   Cardiovascular: Positive for orthopnea and leg swelling. Negative for chest pain and palpitations.  Gastrointestinal: Negative for abdominal pain, blood in stool, constipation, diarrhea, heartburn, melena,  nausea and vomiting.  Genitourinary: Negative for dysuria, flank pain, frequency and hematuria.  Musculoskeletal: Negative for falls, joint pain and myalgias.  Skin: Negative.  Negative for itching and rash.  Neurological: Negative for dizziness, loss of consciousness, weakness and headaches.  Psychiatric/Behavioral: Negative.  Negative for depression.       MEDICATIONS AT HOME:   Prior to Admission medications   Medication Sig Start Date End Date Taking? Authorizing Provider  rivaroxaban (XARELTO) 20 MG TABS tablet Take 20 mg by mouth daily with supper.   Yes [provider]  albuterol (ACCUNEB) 1.25 MG/3ML nebulizer solution Take 1 ampule by nebulization every 6 (six) hours as needed for wheezing.    [provider]  escitalopram (LEXAPRO) 10 MG tablet Take 1 tablet (10 mg total) by mouth daily for 30 days. Patient taking differently: Take 10 mg by mouth daily as needed (irritability).  01/09/19 02/17/19  Iloabachie, Chioma E, NP  furosemide (LASIX) 20 MG tablet Take 1 tablet (20 mg total) by mouth daily. Patient not taking: Reported on 03/10/2019 12/03/18   Iloabachie, Chioma E, NP  triamcinolone cream (KENALOG) 0.1 % Apply 1 application topically 2 (two) times daily. 02/04/19   Iloabachie, Chioma E, NP      VITAL SIGNS:  Blood pressure (!) 141/124, pulse (!) 51, temperature 98.6 F (37 C), temperature source Oral, resp. rate 20, SpO2 100 %.  PHYSICAL EXAMINATION:  Physical Exam Constitutional:      Appearance: He is obese. He is ill-appearing.  HENT:     Head: Normocephalic.     Mouth/Throat:     Mouth: Mucous membranes are moist.  Eyes:     Extraocular Movements: Extraocular movements intact.     Pupils: Pupils are equal, round, and reactive to light.  Neck:     Musculoskeletal: Normal range of motion and neck supple.     Vascular: No JVD.  Cardiovascular:     Rate and Rhythm: Normal rate and regular rhythm.     Pulses: Normal pulses.     Heart sounds: Normal heart sounds. No murmur. No friction rub. No gallop.   Pulmonary:     Effort: Pulmonary effort is normal. No respiratory distress.     Breath sounds: Examination of the right-upper field reveals rhonchi. Examination of the left-upper field reveals rhonchi. Examination of the right-middle field reveals rhonchi. Examination of the left-middle field reveals  rhonchi. Rhonchi present. No wheezing or rales.  Chest:     Chest wall: No deformity or tenderness.  Abdominal:     General: Bowel sounds are normal.     Palpations: Abdomen is soft.     Tenderness: There is no abdominal tenderness. There is no guarding or rebound.  Musculoskeletal: Normal range of motion.     Right lower leg: Edema present.     Left lower leg: Edema present.  Skin:    General: Skin is warm and dry.     Capillary Refill: Capillary refill takes less than 2 seconds.     Findings: No erythema or rash.  Neurological:     General: No focal deficit present.     Mental Status: He is alert and oriented to person, place, and time.  Psychiatric:        Mood and Affect: Mood normal.        Behavior: Behavior normal.     LABORATORY PANEL:   CBC Recent Labs  Lab 03/10/19 1451  WBC 11.8*  HGB 17.4*  HCT 52.7*  PLT 193   ------------------------------------------------------------------------------------------------------------------  Chemistries  Recent Labs  Lab 03/10/19 1451 03/10/19 1940  NA 137  --   K 4.4  --   CL 100  --   CO2 32  --   GLUCOSE 113*  --   BUN 10  --   CREATININE 0.75  --   CALCIUM 8.6*  --   AST  --  18  ALT  --  14  ALKPHOS  --  123  BILITOT  --  0.5   ------------------------------------------------------------------------------------------------------------------  Cardiac Enzymes No results for input(s): TROPONINI in the last 168 hours. ------------------------------------------------------------------------------------------------------------------  RADIOLOGY:  Dg Chest 2 View  Result Date: 03/10/2019 CLINICAL DATA:  Shortness of breath EXAM: CHEST - 2 VIEW COMPARISON:  Chest x-ray July 28, 2018.  Chest CT February 17, 2019 FINDINGS: Cardiomediastinal silhouette is unchanged. Cardiomegaly. No pneumothorax. Mild atelectasis in the right base. No suspicious infiltrates. No overt edema. IMPRESSION: No active cardiopulmonary  disease. Electronically Signed   By: Gerome Samavid  Williams III M.D   On: 03/10/2019 16:49   Ct Angio Chest Pe W And/or Wo Contrast  Result Date: 03/10/2019 CLINICAL DATA:  Acute on chronic shortness of breath. History of pulmonary embolus. PE suspected, high pretest prob EXAM: CT ANGIOGRAPHY CHEST WITH CONTRAST TECHNIQUE: Multidetector CT imaging of the chest was performed using the standard protocol during bolus administration of intravenous contrast. Multiplanar CT image reconstructions and MIPs were obtained to evaluate the vascular anatomy. CONTRAST:  100mL ISOVUE-370 IOPAMIDOL (ISOVUE-370) INJECTION 76% COMPARISON:  Chest radiograph earlier this day. Most recent prior CT 3 weeks ago 02/17/2019 FINDINGS: Cardiovascular: No acute filling defects in the pulmonary arteries. Improving chronic nonocclusive clot in the right interlobar pulmonary artery and chronic web in the right lower lobe pulmonary artery detailed evaluation of the lung bases obscured by breathing motion artifact. Chronically dilated main pulmonary artery of 4.9 cm. Multi chamber cardiomegaly. Chronic straightening of the intraventricular septum and right heart dilatation. Progressive contrast reflux into the hepatic veins and IVC. Mediastinum/Nodes: Small left hilar lymph nodes abutting the main pulmonary artery, likely reactive. Additional small paratracheal nodes, not enlarged by size criteria. No thyroid nodule. Esophagus is decompressed. Lungs/Pleura: Breathing motion artifact limits assessment. New scattered heterogeneous and nodular opacities in the right greater than left lung, for example image 62 series 6 in the right lower lobe, image 40 series 6 in the right upper lobe. Left apical nodular opacities, image 18 series 6. No definite septal thickening. No pleural fluid. Chronic scarring in the periphery of the right middle lobe. Upper Abdomen: Chronic high-density in the gastric cardia, possible debris within the gastric diverticulum,  unchanged from prior exams. Known gallstone. Known right adrenal myelolipoma. Musculoskeletal: There are no acute or suspicious osseous abnormalities. Review of the MIP images confirms the above findings. IMPRESSION: 1. No acute pulmonary embolus. 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 2. Progressive contrast refluxing into the hepatic veins and IVC consistent with increasing right heart dysfunction. Chronic pulmonary arterial hypertension. Multi chamber cardiomegaly. 3. Scattered heterogeneous and nodular opacities in both lungs, likely infectious. Electronically Signed   By: Narda RutherfordMelanie  Sanford M.D.   On: 03/10/2019 22:31      IMPRESSION AND PLAN:   1.  New onset atrial fibrillation with RVR - Continue Xarelto - Continue Cardizem infusion as started in the emergency room - Echocardiogram - Trend troponin levels - CBC and BMP in the a.m. - Cardiology consulted for further evaluation and recommendations -Telemetry monitoring  2.  Bilateral pneumonia - IV  Rocephin and azithromycin - O2 per nasal cannula - DuoNeb every 6 as needed - Sputum culture - Blood culture  3. 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  Obstructive sleep apnea - CPAP nightly and during hours of sleep  5.  Tobacco abuse - We have discussed the importance of tobacco cessation.  DVT and PPI prophylaxis initiated    All the records are reviewed and case discussed with ED provider. The plan of care was discussed in details with the patient (and family). I answered all questions. The patient agreed to proceed with the above mentioned plan. Further management will depend upon hospital course.   CODE STATUS: Full code  TOTAL TIME TAKING CARE OF THIS PATIENT: 45minutes.    Milas Kocherngela H Mellie Buccellato CRNP on 03/11/2019 at 1:35 AM  Pager - [phone]  After 6pm go to  [email or web address] - Social research officer, governmentpassword [email or web address]  [facility] [place] [facility]  Office  [phone]  CC: Primary care physician; Patient, No Pcp Per   Note: This dictation was prepared with Dragon dictation along with smaller phrase technology. Any transcriptional errors that result from this process are unintentional.

## 2019-03-11 NOTE — Plan of Care (Signed)
  Problem: Education: Goal: Knowledge of General Education information will improve Description: Including pain rating scale, medication(s)/side effects and non-pharmacologic comfort measures Outcome: Progressing   Problem: Health Behavior/Discharge Planning: Goal: Ability to manage health-related needs will improve Outcome: Progressing Note: Patient to come off of IV Cardizem drip momentarily, to be switched to PO. Converted back to NSR earlier today. Will continue to monitor heart rate and rhythm. Wenda Low [facility]

## 2019-03-11 NOTE — Progress Notes (Signed)
Patient converted to NSR with HR in the 80-90. WIll continue to monitor

## 2019-03-12 ENCOUNTER — Ambulatory Visit: Payer: Self-pay | Admitting: Licensed Clinical Social Worker

## 2019-03-12 ENCOUNTER — Telehealth: Payer: Self-pay | Admitting: Licensed Clinical Social Worker

## 2019-03-12 ENCOUNTER — Inpatient Hospital Stay: Payer: Self-pay

## 2019-03-12 ENCOUNTER — Encounter: Payer: Self-pay | Admitting: *Deleted

## 2019-03-12 ENCOUNTER — Other Ambulatory Visit: Payer: Self-pay

## 2019-03-12 LAB — TSH: TSH: 1.636 u[IU]/mL (ref 0.350–4.500)

## 2019-03-12 LAB — LEGIONELLA PNEUMOPHILA SEROGP 1 UR AG: L. pneumophila Serogp 1 Ur Ag: NEGATIVE

## 2019-03-12 LAB — HIV ANTIBODY (ROUTINE TESTING W REFLEX): HIV Screen 4th Generation wRfx: NONREACTIVE

## 2019-03-12 MED ORDER — TRAMADOL HCL 50 MG PO TABS
50.0000 mg | ORAL_TABLET | Freq: Four times a day (QID) | ORAL | Status: DC | PRN
  Administered 2019-03-12: 50 mg via ORAL
  Filled 2019-03-12: qty 1

## 2019-03-12 MED ORDER — AZITHROMYCIN 250 MG PO TABS
500.0000 mg | ORAL_TABLET | Freq: Every day | ORAL | Status: DC
  Administered 2019-03-13: 10:00:00 500 mg via ORAL
  Filled 2019-03-12: qty 2

## 2019-03-12 MED ORDER — CEFUROXIME AXETIL 500 MG PO TABS
500.0000 mg | ORAL_TABLET | Freq: Two times a day (BID) | ORAL | Status: DC
  Administered 2019-03-13 – 2019-03-14 (×3): 500 mg via ORAL
  Filled 2019-03-12 (×4): qty 1

## 2019-03-12 NOTE — Plan of Care (Signed)
Pt very drowsy and fatigued, sleep most all of my shift,CPAP while asleep. IV abx for PNA. 4L O2 acute  Problem: Activity: Goal: Risk for activity intolerance will decrease Outcome: Progressing Note: Dangled on bedside, very wobbly    Problem: Nutrition: Goal: Adequate nutrition will be maintained Outcome: Progressing   Problem: Coping: Goal: Level of anxiety will decrease Outcome: Progressing   Problem: Pain Managment: Goal: General experience of comfort will improve Outcome: Progressing Note: No complaints of pain this shift   Problem: Safety: Goal: Ability to remain free from injury will improve Outcome: Progressing   Problem: Skin Integrity: Goal: Risk for impaired skin integrity will decrease Outcome: Progressing

## 2019-03-12 NOTE — Progress Notes (Signed)
[facility] NAME: Matthew [name]    MR#:  [id number]  DATE OF BIRTH:  October 29, 1970  SUBJECTIVE:  CHIEF COMPLAINT:   Chief Complaint  Patient presents with  . Shortness of Breath  . Cough   Came with cough and fever with palpitation and tachycardia. Noted to have A. fib with RVR and after starting Cardizem drip heart rate is under control.  Started on antibiotic treatment for pneumonia. Patient was fully alert but he did not know where he is or could not tell me the month and the year.  REVIEW OF SYSTEMS:  CONSTITUTIONAL: No fever, fatigue or weakness.  EYES: No blurred or double vision.  EARS, NOSE, AND THROAT: No tinnitus or ear pain.  RESPIRATORY: No cough, shortness of breath, wheezing or hemoptysis.  CARDIOVASCULAR: No chest pain, orthopnea, edema.  GASTROINTESTINAL: No nausea, vomiting, diarrhea or abdominal pain.  GENITOURINARY: No dysuria, hematuria.  ENDOCRINE: No polyuria, nocturia,  HEMATOLOGY: No anemia, easy bruising or bleeding SKIN: No rash or lesion. MUSCULOSKELETAL: No joint pain or arthritis.   NEUROLOGIC: No tingling, numbness, weakness.  PSYCHIATRY: No anxiety or depression.   ROS  DRUG ALLERGIES:  No Known Allergies  VITALS:  Blood pressure 117/85, pulse 68, temperature 99.1 F (37.3 C), temperature source Oral, resp. rate 20, height 5\' 7"  (1.702 m), weight 127.2 kg, SpO2 90 %.  PHYSICAL EXAMINATION:  GENERAL:  48 y.o.-year-old patient lying in the bed with no acute distress.  EYES: Pupils equal, round, reactive to light and accommodation. No scleral icterus. Extraocular muscles intact.  HEENT: Head atraumatic, normocephalic. Oropharynx and nasopharynx clear.  NECK:  Supple, no jugular venous distention. No thyroid enlargement, no tenderness.  LUNGS: Normal breath sounds bilaterally, no wheezing, have some crepitation. No use of accessory muscles of respiration.  CARDIOVASCULAR: S1, S2 normal. No  murmurs, rubs, or gallops.  ABDOMEN: Soft, nontender, nondistended. Bowel sounds present. No organomegaly or mass.  EXTREMITIES: No pedal edema, cyanosis, or clubbing.  Bilateral leg swelling more than on left. NEUROLOGIC: Cranial nerves II through XII are intact. Muscle strength 4/5 in all extremities. Sensation intact. Gait not checked.  PSYCHIATRIC: The patient is alert and oriented x 2.  SKIN: No obvious rash, lesion, or ulcer.   Physical Exam LABORATORY PANEL:   CBC Recent Labs  Lab 03/10/19 1451  WBC 11.8*  HGB 17.4*  HCT 52.7*  PLT 193   ------------------------------------------------------------------------------------------------------------------  Chemistries  Recent Labs  Lab 03/10/19 1451 03/10/19 1940  NA 137  --   K 4.4  --   CL 100  --   CO2 32  --   GLUCOSE 113*  --   BUN 10  --   CREATININE 0.75  --   CALCIUM 8.6*  --   AST  --  18  ALT  --  14  ALKPHOS  --  123  BILITOT  --  0.5   ------------------------------------------------------------------------------------------------------------------  Cardiac Enzymes No results for input(s): TROPONINI in the last 168 hours. ------------------------------------------------------------------------------------------------------------------  RADIOLOGY:  Dg Chest 2 View  Result Date: 03/12/2019 CLINICAL DATA:  Hypoxia. EXAM: CHEST - 2 VIEW COMPARISON:  Radiographs and CT scan of March 10, 2019. FINDINGS: Stable cardiomegaly. No pneumothorax or pleural effusion is noted. Both lungs are clear. The visualized skeletal structures are unremarkable. IMPRESSION: No active cardiopulmonary disease. Electronically Signed   By: Marijo Conception M.D.   On: 03/12/2019 10:14   Ct Angio Chest Pe W And/or Wo Contrast  Result Date: 03/10/2019 CLINICAL DATA:  Acute on chronic shortness of breath. History of pulmonary embolus. PE suspected, high pretest prob EXAM: CT ANGIOGRAPHY CHEST WITH CONTRAST TECHNIQUE: Multidetector CT  imaging of the chest was performed using the standard protocol during bolus administration of intravenous contrast. Multiplanar CT image reconstructions and MIPs were obtained to evaluate the vascular anatomy. CONTRAST:  100mL ISOVUE-370 IOPAMIDOL (ISOVUE-370) INJECTION 76% COMPARISON:  Chest radiograph earlier this day. Most recent prior CT 3 weeks ago 02/17/2019 FINDINGS: Cardiovascular: No acute filling defects in the pulmonary arteries. Improving chronic nonocclusive clot in the right interlobar pulmonary artery and chronic web in the right lower lobe pulmonary artery detailed evaluation of the lung bases obscured by breathing motion artifact. Chronically dilated main pulmonary artery of 4.9 cm. Multi chamber cardiomegaly. Chronic straightening of the intraventricular septum and right heart dilatation. Progressive contrast reflux into the hepatic veins and IVC. Mediastinum/Nodes: Small left hilar lymph nodes abutting the main pulmonary artery, likely reactive. Additional small paratracheal nodes, not enlarged by size criteria. No thyroid nodule. Esophagus is decompressed. Lungs/Pleura: Breathing motion artifact limits assessment. New scattered heterogeneous and nodular opacities in the right greater than left lung, for example image 62 series 6 in the right lower lobe, image 40 series 6 in the right upper lobe. Left apical nodular opacities, image 18 series 6. No definite septal thickening. No pleural fluid. Chronic scarring in the periphery of the right middle lobe. Upper Abdomen: Chronic high-density in the gastric cardia, possible debris within the gastric diverticulum, unchanged from prior exams. Known gallstone. Known right adrenal myelolipoma. Musculoskeletal: There are no acute or suspicious osseous abnormalities. Review of the MIP images confirms the above findings. IMPRESSION: 1. No acute pulmonary embolus. 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 2.  Progressive contrast refluxing into the hepatic veins and IVC consistent with increasing right heart dysfunction. Chronic pulmonary arterial hypertension. Multi chamber cardiomegaly. 3. Scattered heterogeneous and nodular opacities in both lungs, likely infectious. Electronically Signed   By: Narda RutherfordMelanie  Sanford M.D.   On: 03/10/2019 22:31   [place] Venous Img Lower Bilateral  Result Date: 03/11/2019 CLINICAL DATA:  History of prior DVT with persistent bilateral lower extremity edema. Evaluate for acute or chronic DVT. EXAM: BILATERAL LOWER EXTREMITY VENOUS DOPPLER ULTRASOUND TECHNIQUE: Gray-scale sonography with graded compression, as well as color Doppler and duplex ultrasound were performed to evaluate the lower extremity deep venous systems from the level of the common femoral vein and including the common femoral, femoral, profunda femoral, popliteal and calf veins including the posterior tibial, peroneal and gastrocnemius veins when visible. The superficial great saphenous vein was also interrogated. Spectral Doppler was utilized to evaluate flow at rest and with distal augmentation maneuvers in the common femoral, femoral and popliteal veins. COMPARISON:  Left lower extremity venous Doppler ultrasound-03/04/2019; 11/19/2018 FINDINGS: RIGHT LOWER EXTREMITY Common Femoral Vein: No evidence of thrombus. Normal compressibility, respiratory phasicity and response to augmentation. Saphenofemoral Junction: No evidence of thrombus. Normal compressibility and flow on color Doppler imaging. Profunda Femoral Vein: No evidence of thrombus. Normal compressibility and flow on color Doppler imaging. Femoral Vein: No evidence of thrombus. Normal compressibility, respiratory phasicity and response to augmentation. Popliteal Vein: No evidence of thrombus. Normal compressibility, respiratory phasicity and response to augmentation. Calf Veins: Appears patent where imaged. Superficial Great Saphenous Vein: No evidence of thrombus.  Normal compressibility. Venous Reflux:  None. Other Findings:  None. LEFT LOWER EXTREMITY Common Femoral Vein: No evidence of thrombus. Normal compressibility, respiratory phasicity and  response to augmentation. Saphenofemoral Junction: No evidence of thrombus. Normal compressibility and flow on color Doppler imaging. Profunda Femoral Vein: No evidence of thrombus. Normal compressibility and flow on color Doppler imaging. Femoral Vein: No evidence of thrombus. Normal compressibility, respiratory phasicity and response to augmentation. Popliteal Vein: No evidence of thrombus. Normal compressibility, respiratory phasicity and response to augmentation. Calf Veins: Appears patent where imaged. Superficial Great Saphenous Vein: Not imaged on the present examination. Venous Reflux:  None. Other Findings:  None. IMPRESSION: 1. No definite evidence of acute or chronic DVT within either lower extremity with special attention paid to the left femoral vein. 2. Left greater saphenous vein was not imaged the present examination though was noted to contain occlusive superficial thrombophlebitis on the 03/04/2019 examination. Electronically Signed   By: Simonne ComeJohn  Watts M.D.   On: 03/11/2019 14:19    ASSESSMENT AND PLAN:   Active Problems:   Bilateral pneumonia  1.  New onset atrial fibrillation with RVR - Continue Xarelto - Continue Cardizem infusion as started in the emergency room-now converted to sinus rhythm,switch to oral long-acting Cardizem and taper the IV drip off. - Echocardiogram-ejection fraction 60% with slight dilation of the right side. - Trend troponin levels- stable. - Cardiology consulted for further evaluation and recommendations-agreed with current management and no new changes suggested. -Telemetry monitoring  2.  Bilateral pneumonia - IV Rocephin and azithromycin - O2 per nasal cannula - DuoNeb every 6 as needed - Blood culture-negative so far  3. 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  Obstructive sleep apnea - CPAP nightly and during hours of sleep  5.  Tobacco abuse - We have discussed the importance of tobacco cessation for 4 minutes.  6.  Altered mental status As patient has history of atrial fibrillation, I will check CT head.  He does not have any focal abnormality.  He also have oxygen requirement went up to 3 to 4 L so would check his ABG.  DVT and PPI prophylaxis initiated    All the records are reviewed and case discussed with Care Management/Social Workerr. Management plans discussed with the patient, family and they are in agreement.  CODE STATUS: Full.  TOTAL TIME TAKING CARE OF THIS PATIENT: 35 minutes.    POSSIBLE D/C IN 1-2 DAYS, DEPENDING ON CLINICAL CONDITION.   Altamese DillingVaibhavkumar Ladrea Holladay M.D on 03/12/2019   Between 7am to 6pm - Pager - [phone]  After 6pm go to [email or web address] - password [email or web address]  [facility]  Office  [phone]  CC: Primary care physician; Patient, No Pcp Per  Note: This dictation was prepared with Dragon dictation along with smaller phrase technology. Any transcriptional errors that result from this process are unintentional.

## 2019-03-12 NOTE — Progress Notes (Addendum)
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 Both with yellow/green thick drainage.  Wound consult requested. Wounds cleansed with NS.  Covered with moist 2x2 and then foam.

## 2019-03-12 NOTE — Progress Notes (Signed)
Patient continues to be disoriented.  Remains drowsy.  Sleeping most of the time.

## 2019-03-12 NOTE — Telephone Encounter (Signed)
Clinician attempted to call the patient for his scheduled phone visit but someone else answered that sounded half asleep and stated that it wasn't Mr. Pherigo.

## 2019-03-12 NOTE — Progress Notes (Signed)
O2 sats low.  Patient was on 2L [place].  I had weaned him to 3L.  No documentation of who decreased the oxygen.  Oxygen returned to [facility]. Sats climbed to 90 %

## 2019-03-12 NOTE — Consult Note (Signed)
[facility] Nurse wound consult note Patient receiving care in [facility]. Reason for Consult: LLE wounds Wound type: Trauma injuries that have been present for about 2 weeks that occurred after the patient bumped his LLE shin on a box.  He states he has been putting neosporin on the areas. Measurement: The largest area closest to the knee, measures 2 cm x 1.5 cm.  The one below this measures 1.3 cm x 0.7 cm.  Both have islands of granulation tissue and thin areas of yellow biofilm.  No s/s of infection, very little drainage on existing dressing, no odor.  There is some erythema of the leg in the region. Dressing procedure/placement/frequency: Cleanse with saline. Apply a small piece of Xeroform gauze over the sites, then a foam dressing.  Change every 2 days. Monitor the wound area(s) for worsening of condition such as: Signs/symptoms of infection,  Increase in size,  Development of or worsening of odor, Development of pain, or increased pain at the affected locations.  Notify the medical team if any of these develop.  Thank you for the consult.  Discussed plan of care with the patient.  [facility] nurse will not follow at this time.  Please re-consult the [place] team if needed.  Val Riles, RN, MSN, CWOCN, CNS-BC, pager [phone]

## 2019-03-12 NOTE — Progress Notes (Signed)
[facility]  Patient: Matthew CrankerKelly Bristow / Admit Date: 03/10/2019 / Date of Encounter: 03/12/2019, 8:07 AM   Subjective: Patient continues to be short of breath weak and fatigued.  No evidence of chest discomfort or congestive heart failure symptoms.  Troponin stable at 29 consistent with demand ischemia rather than acute coronary syndrome.  No telemetry or EKG changes. Echocardiogram showing normal LV systolic function with ejection fraction of 55% and mild dilation of right ventricle right atrium consistent with pulmonary hypertension and right heart strain Change in lower extremity edema  Review of Systems: Positive for: Shortness of breath Negative for: Vision change, hearing change, syncope, dizziness, nausea, vomiting,diarrhea, bloody stool, stomach pain, cough, congestion, diaphoresis, urinary frequency, urinary pain,skin lesions, skin rashes Others previously listed  Objective: Telemetry: Normal sinus rhythm with bundle branch block Physical Exam: Blood pressure 106/65, pulse 61, temperature 98.6 F (37 C), temperature source Oral, resp. rate 20, height 5\' 7"  (1.702 m), weight 127.2 kg, SpO2 95 %. Body mass index is 43.92 kg/m. General: Well developed, well nourished, in no acute distress. Head: Normocephalic, atraumatic, sclera non-icteric, no xanthomas, nares are without discharge. Neck: No apparent masses Lungs: Normal respirations with no wheezes, some rhonchi, no rales , few crackles   Heart: Regular rate and rhythm, normal S1 S2, no murmur, no rub, no gallop, PMI is normal size and placement, carotid upstroke normal without bruit, jugular venous pressure normal Abdomen: Soft, non-tender, non-distended with normoactive bowel sounds. No hepatosplenomegaly. Abdominal aorta is normal size without bruit Extremities: 1-2+ edema, no clubbing, no cyanosis, no ulcers,  Peripheral: 2+ radial, 2+ femoral, 2+ dorsal pedal pulses Neuro: Alert and oriented.  Moves all extremities spontaneously. Psych:  Responds to questions appropriately with a normal affect.   Intake/Output Summary (Last 24 hours) at 03/12/2019 0807 Last data filed at 03/12/2019 0648 Gross per 24 hour  Intake 460.6 ml  Output 1475 ml  Net -1014.4 ml    Inpatient Medications:  . diltiazem  120 mg Oral Daily  . rivaroxaban  20 mg Oral Q supper  . sodium chloride flush  3 mL Intravenous Once  . sodium chloride flush  3 mL Intravenous Q12H   Infusions:  . sodium chloride 10 mL/hr at 03/12/19 0648  . azithromycin 500 mg (03/12/19 0655)  . cefTRIAXone (ROCEPHIN)  IV Stopped (03/12/19 0642)    Labs: Recent Labs    03/10/19 1451  NA 137  K 4.4  CL 100  CO2 32  GLUCOSE 113*  BUN 10  CREATININE 0.75  CALCIUM 8.6*   Recent Labs    03/10/19 1940  AST 18  ALT 14  ALKPHOS 123  BILITOT 0.5  PROT 7.4  ALBUMIN 3.0*   Recent Labs    03/10/19 1451  WBC 11.8*  HGB 17.4*  HCT 52.7*  MCV 99.6  PLT 193   No results for input(s): CKTOTAL, CKMB, TROPONINI in the last 72 hours. Invalid input(s): POCBNP No results for input(s): HGBA1C in the last 72 hours.   Weights: Filed Weights   03/11/19 0236  Weight: 127.2 kg     Radiology/Studies:  Dg Chest 2 View  Result Date: 03/10/2019 CLINICAL DATA:  Shortness of breath EXAM: CHEST - 2 VIEW COMPARISON:  Chest x-ray July 28, 2018.  Chest CT February 17, 2019 FINDINGS: Cardiomediastinal silhouette is unchanged. Cardiomegaly. No pneumothorax. Mild atelectasis in the right base. No suspicious infiltrates. No overt edema. IMPRESSION: No active cardiopulmonary disease. Electronically Signed   By: Gerome Samavid  Williams  III M.D   On: 03/10/2019 16:49   Ct Angio Chest Pe W And/or Wo Contrast  Result Date: 03/10/2019 CLINICAL DATA:  Acute on chronic shortness of breath. History of pulmonary embolus. PE suspected, high pretest prob EXAM: CT ANGIOGRAPHY CHEST WITH CONTRAST TECHNIQUE: Multidetector CT imaging of the chest was performed  using the standard protocol during bolus administration of intravenous contrast. Multiplanar CT image reconstructions and MIPs were obtained to evaluate the vascular anatomy. CONTRAST:  12mL ISOVUE-370 IOPAMIDOL (ISOVUE-370) INJECTION 76% COMPARISON:  Chest radiograph earlier this day. Most recent prior CT 3 weeks ago 02/17/2019 FINDINGS: Cardiovascular: No acute filling defects in the pulmonary arteries. Improving chronic nonocclusive clot in the right interlobar pulmonary artery and chronic web in the right lower lobe pulmonary artery detailed evaluation of the lung bases obscured by breathing motion artifact. Chronically dilated main pulmonary artery of 4.9 cm. Multi chamber cardiomegaly. Chronic straightening of the intraventricular septum and right heart dilatation. Progressive contrast reflux into the hepatic veins and IVC. Mediastinum/Nodes: Small left hilar lymph nodes abutting the main pulmonary artery, likely reactive. Additional small paratracheal nodes, not enlarged by size criteria. No thyroid nodule. Esophagus is decompressed. Lungs/Pleura: Breathing motion artifact limits assessment. New scattered heterogeneous and nodular opacities in the right greater than left lung, for example image 62 series 6 in the right lower lobe, image 40 series 6 in the right upper lobe. Left apical nodular opacities, image 18 series 6. No definite septal thickening. No pleural fluid. Chronic scarring in the periphery of the right middle lobe. Upper Abdomen: Chronic high-density in the gastric cardia, possible debris within the gastric diverticulum, unchanged from prior exams. Known gallstone. Known right adrenal myelolipoma. Musculoskeletal: There are no acute or suspicious osseous abnormalities. Review of the MIP images confirms the above findings. IMPRESSION: 1. No acute pulmonary embolus. 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 2. Progressive contrast refluxing into the  hepatic veins and IVC consistent with increasing right heart dysfunction. Chronic pulmonary arterial hypertension. Multi chamber cardiomegaly. 3. Scattered heterogeneous and nodular opacities in both lungs, likely infectious. Electronically Signed   By: Keith Rake M.D.   On: 03/10/2019 22:31   Ct Angio Chest Pe W And/or Wo Contrast  Result Date: 02/17/2019 CLINICAL DATA:  Hypoxia.  New right lower extremity DVT. EXAM: CT ANGIOGRAPHY CHEST WITH CONTRAST TECHNIQUE: Multidetector CT imaging of the chest was performed using the standard protocol during bolus administration of intravenous contrast. Multiplanar CT image reconstructions and MIPs were obtained to evaluate the vascular anatomy. CONTRAST:  63mL OMNIPAQUE IOHEXOL 350 MG/ML SOLN COMPARISON:  CTA chest dated November 19, 2018. FINDINGS: Cardiovascular: Satisfactory opacification of the pulmonary arteries to the segmental level. No evidence of acute pulmonary embolism. Interval decrease in size of the peripheral nonocclusive thrombus in the right interlobar pulmonary artery with unchanged small web like defects at the right lower lobe segmental pulmonary artery origins. Unchanged severe dilatation of the main pulmonary artery with mild cardiomegaly, right heart enlargement, and reflux of contrast into the intrahepatic veins. No pericardial effusion. No thoracic aortic aneurysm. Mediastinum/Nodes: No enlarged mediastinal, hilar, or axillary lymph nodes. Thyroid gland, trachea, and esophagus demonstrate no significant findings. Lungs/Pleura: No focal consolidation, pleural effusion, or pneumothorax. Unchanged scarring in the right middle lobe. No suspicious pulmonary nodule. Upper Abdomen: No acute abnormality. Unchanged cholelithiasis and small right adrenal myelolipoma. Musculoskeletal: No chest wall abnormality. No acute or significant osseous findings. Review of the MIP images confirms the above findings. IMPRESSION: 1.  No  evidence of acute pulmonary  embolism. 2. 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 3. Unchanged pulmonary arterial hypertension and right heart dysfunction. Electronically Signed   By: Obie DredgeWilliam T Derry M.D.   On: 02/17/2019 18:03   [place] Venous Img Lower Bilateral  Result Date: 03/11/2019 CLINICAL DATA:  History of prior DVT with persistent bilateral lower extremity edema. Evaluate for acute or chronic DVT. EXAM: BILATERAL LOWER EXTREMITY VENOUS DOPPLER ULTRASOUND TECHNIQUE: Gray-scale sonography with graded compression, as well as color Doppler and duplex ultrasound were performed to evaluate the lower extremity deep venous systems from the level of the common femoral vein and including the common femoral, femoral, profunda femoral, popliteal and calf veins including the posterior tibial, peroneal and gastrocnemius veins when visible. The superficial great saphenous vein was also interrogated. Spectral Doppler was utilized to evaluate flow at rest and with distal augmentation maneuvers in the common femoral, femoral and popliteal veins. COMPARISON:  Left lower extremity venous Doppler ultrasound-03/04/2019; 11/19/2018 FINDINGS: RIGHT LOWER EXTREMITY Common Femoral Vein: No evidence of thrombus. Normal compressibility, respiratory phasicity and response to augmentation. Saphenofemoral Junction: No evidence of thrombus. Normal compressibility and flow on color Doppler imaging. Profunda Femoral Vein: No evidence of thrombus. Normal compressibility and flow on color Doppler imaging. Femoral Vein: No evidence of thrombus. Normal compressibility, respiratory phasicity and response to augmentation. Popliteal Vein: No evidence of thrombus. Normal compressibility, respiratory phasicity and response to augmentation. Calf Veins: Appears patent where imaged. Superficial Great Saphenous Vein: No evidence of  thrombus. Normal compressibility. Venous Reflux:  None. Other Findings:  None. LEFT LOWER EXTREMITY Common Femoral Vein: No evidence of thrombus. Normal compressibility, respiratory phasicity and response to augmentation. Saphenofemoral Junction: No evidence of thrombus. Normal compressibility and flow on color Doppler imaging. Profunda Femoral Vein: No evidence of thrombus. Normal compressibility and flow on color Doppler imaging. Femoral Vein: No evidence of thrombus. Normal compressibility, respiratory phasicity and response to augmentation. Popliteal Vein: No evidence of thrombus. Normal compressibility, respiratory phasicity and response to augmentation. Calf Veins: Appears patent where imaged. Superficial Great Saphenous Vein: Not imaged on the present examination. Venous Reflux:  None. Other Findings:  None. IMPRESSION: 1. No definite evidence of acute or chronic DVT within either lower extremity with special attention paid to the left femoral vein. 2. Left greater saphenous vein was not imaged the present examination though was noted to contain occlusive superficial thrombophlebitis on the 03/04/2019 examination. Electronically Signed   By: Simonne ComeJohn  Watts M.D.   On: 03/11/2019 14:19   [place] Venous Img Lower Unilateral Left  Result Date: 03/04/2019 CLINICAL DATA:  Swelling and pain EXAM: Left LOWER EXTREMITY VENOUS DOPPLER ULTRASOUND TECHNIQUE: Gray-scale sonography with graded compression, as well as color Doppler and duplex ultrasound were performed to evaluate the lower extremity deep venous systems from the level of the common femoral vein and including the common femoral, femoral, profunda femoral, popliteal and calf veins including the posterior tibial, peroneal and gastrocnemius veins when visible. The superficial great saphenous vein was also interrogated. Spectral Doppler was utilized to evaluate flow at rest and with distal augmentation maneuvers in the common femoral, femoral and popliteal veins.  COMPARISON:  November 19, 2018 FINDINGS: Contralateral Common Femoral Vein: Respiratory phasicity is normal and symmetric with the symptomatic side. No evidence of thrombus. Normal compressibility. Common Femoral Vein: No evidence of thrombus. Normal compressibility, respiratory phasicity and response to augmentation. Saphenofemoral Junction: No  evidence of thrombus. Normal compressibility and flow on color Doppler imaging. Profunda Femoral Vein: No evidence of thrombus. Normal compressibility and flow on color Doppler imaging. Femoral Vein: There is nonocclusive thrombus throughout the femoral vein. The thrombus appears to be located in an eccentric position, favoring a chronic thrombus. There is occlusive thrombus in the duplicated femoral vein in the mid and distal portions. Popliteal Vein: No evidence of thrombus. Normal compressibility, respiratory phasicity and response to augmentation. Calf Veins: No evidence of thrombus. Normal compressibility and flow on color Doppler imaging. The peroneal vein was not visualized. Superficial Great Saphenous Vein: There is thrombus within the great saphenous vein. The thrombus is occlusive in the distal portion. Venous Reflux:  None. Other Findings:  There is lower extremity edema. IMPRESSION: 1. Chronic appearing nonocclusive thrombus throughout the left femoral vein. 2. Occlusive and near occlusive thrombus throughout the left great saphenous vein, a superficial vein of the lower extremity. 3. Nonvisualization of the peroneal vein. Chronic occlusion cannot be excluded. Electronically Signed   By: Katherine Mantlehristopher  Green M.D.   On: 03/04/2019 19:11   [place] Venous Img Lower Unilateral Right  Result Date: 02/17/2019 CLINICAL DATA:  Right leg pain.  History of DVT left leg. EXAM: RIGHT LOWER EXTREMITY VENOUS DOPPLER ULTRASOUND TECHNIQUE: Gray-scale sonography with graded compression, as well as color Doppler and duplex ultrasound were performed to evaluate the lower extremity deep  venous systems from the level of the common femoral vein and including the common femoral, femoral, profunda femoral, popliteal and calf veins including the posterior tibial, peroneal and gastrocnemius veins when visible. The superficial great saphenous vein was also interrogated. Spectral Doppler was utilized to evaluate flow at rest and with distal augmentation maneuvers in the common femoral, femoral and popliteal veins. COMPARISON:  Left lower extremity color flow duplex Doppler 11/19/2018. FINDINGS: Contralateral Common Femoral Vein: Respiratory phasicity is normal and symmetric with the symptomatic side. No evidence of thrombus. Normal compressibility. Common Femoral Vein: No evidence of thrombus. Normal compressibility, respiratory phasicity and response to augmentation. Saphenofemoral Junction: No evidence of thrombus. Normal compressibility and flow on color Doppler imaging. Profunda Femoral Vein: No evidence of thrombus. Normal compressibility and flow on color Doppler imaging. Femoral Vein: No evidence of thrombus. Normal compressibility, respiratory phasicity and response to augmentation. Popliteal Vein: Occlusive thrombus noted in the popliteal vein. Calf Veins: Occlusive thrombus noted in the peroneal veins. Superficial Great Saphenous Vein: No evidence of thrombus. Normal compressibility. Other Findings:  None. IMPRESSION: Occlusive thrombus noted in the popliteal vein and peroneal veins. Electronically Signed   By: Maisie Fushomas  Register   On: 02/17/2019 14:30     Assessment and Recommendation  48 y.o. male with known significant tobacco abuse having recurrent deep venous thrombosis with possible hypoxia and pneumonia and elevation of troponin most consistent with demand ischemia rather than acute coronary syndrome without evidence of congestive heart failure slightly improved from yesterday 1.  Continue supportive care for deep venous thrombosis with lower extremity edema with anticoagulation 2.   Continue antibiotics oxygen and pulmonary toilet for respiratory failure 3.  No further intervention mild right heart strain likely secondary to above and stable 4.  Continue hypertension control with diltiazem for heart rate control 5.  No further cardiac diagnostics at this time 6.  Call if further questions  Signed, Arnoldo HookerBruce Christan Defranco M.D. FACC

## 2019-03-13 LAB — CBC
HCT: 55.7 % — ABNORMAL HIGH (ref 39.0–52.0)
Hemoglobin: 17.5 g/dL — ABNORMAL HIGH (ref 13.0–17.0)
MCH: 32.1 pg (ref 26.0–34.0)
MCHC: 31.4 g/dL (ref 30.0–36.0)
MCV: 102.2 fL — ABNORMAL HIGH (ref 80.0–100.0)
Platelets: 164 10*3/uL (ref 150–400)
RBC: 5.45 MIL/uL (ref 4.22–5.81)
RDW: 14.5 % (ref 11.5–15.5)
WBC: 9.7 10*3/uL (ref 4.0–10.5)
nRBC: 0 % (ref 0.0–0.2)

## 2019-03-13 LAB — BASIC METABOLIC PANEL
Anion gap: 7 (ref 5–15)
BUN: 8 mg/dL (ref 6–20)
CO2: 35 mmol/L — ABNORMAL HIGH (ref 22–32)
Calcium: 8.5 mg/dL — ABNORMAL LOW (ref 8.9–10.3)
Chloride: 95 mmol/L — ABNORMAL LOW (ref 98–111)
Creatinine, Ser: 0.55 mg/dL — ABNORMAL LOW (ref 0.61–1.24)
GFR calc Af Amer: >60 mL/min (ref >60)
GFR calc non Af Amer: >60 mL/min (ref >60)
Glucose, Bld: 128 mg/dL — ABNORMAL HIGH (ref 70–99)
Potassium: 4.2 mmol/L (ref 3.5–5.1)
Sodium: 137 mmol/L (ref 135–145)

## 2019-03-13 NOTE — Consult Note (Signed)
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  Patient came to hospital this time due to sudden onset of dyspnea, he had CTPE study done which showed possible pneumonia.  Review of bloodwork shows significant erythrocytosis which is chronic, absence of leukocytosis, negative septic workup thus far.  CT chest with significant motion artifact , no PE , possible R lung infiltrate, generous PA diameter suggestive of possible PAH.  He has OSA and states he uses CPAP device nightly. He had cardiac workup done and is cleared from cardio perspective.     PAST MEDICAL HISTORY   Past Medical History:  Diagnosis Date   Hx of blood clots    Sleep apnea      SURGICAL HISTORY   Past Surgical History:  Procedure Laterality Date   IVC FILTER INSERTION Left 11/24/2018   Procedure: IVC FILTER INSERTION;  Surgeon: Annice Needy, MD;  Location: [facility];  Service: Cardiovascular;  Laterality: Left;   IVC FILTER INSERTION Right 02/18/2019   Procedure: IVC FILTER INSERTION WITH RIGHT LOWER EXTREMITY VENOUS LYSIS;  Surgeon: Renford Dills, MD;  Location: [facility];  Service: Cardiovascular;  Laterality: Right;   IVC FILTER REMOVAL N/A 12/01/2018   Procedure: IVC FILTER REMOVAL;  Surgeon: Annice Needy, MD;  Location: [facility];  Service: Cardiovascular;  Laterality: N/A;   NO PAST SURGERIES       FAMILY HISTORY   No family history on file.   SOCIAL HISTORY   Social History   Tobacco  Use   Smoking status: Current Every Day Smoker    Packs/day: 0.50    Types: Cigarettes   Smokeless tobacco: Never Used  Substance Use Topics   Alcohol use: Yes    Comment: occassional   Drug use: Never     MEDICATIONS    Home Medication:    Current Medication:  Current Facility-Administered Medications:    0.9 %  sodium chloride infusion, 250 mL, Intravenous, PRN, Seals, Milas Kocher, NP, Last Rate: 10 mL/hr at 03/12/19 0648   azithromycin (ZITHROMAX) tablet 500 mg, 500 mg, Oral, Daily, Altamese Dilling, MD, 500 mg at 03/13/19 0944   cefUROXime (CEFTIN) tablet 500 mg, 500 mg, Oral, BID WC, Altamese Dilling, MD, 500 mg at 03/13/19 0944   diltiazem (CARDIZEM CD) 24 hr capsule 120 mg, 120 mg, Oral, Daily, Altamese Dilling, MD, 120 mg at 03/13/19 0944   ipratropium-albuterol (DUONEB) 0.5-2.5 (3) MG/3ML nebulizer solution 3 mL, 3 mL, Nebulization, Q6H PRN, Seals, Milas Kocher, NP   rivaroxaban (XARELTO) tablet 20 mg, 20 mg, Oral, Q supper, Seals, Angela H, NP, 20 mg at 03/12/19 1658   sodium chloride flush (NS) 0.9 % injection 3 mL, 3 mL, Intravenous, Once, Seals, Angela H, NP   sodium chloride flush (NS) 0.9 % injection 3 mL, 3 mL,  Intravenous, Q12H, Seals, Angela H, NP, 3 mL at 03/13/19 0944   sodium chloride flush (NS) 0.9 % injection 3 mL, 3 mL, Intravenous, PRN, Seals, Milas Kocher, NP   traMADol (ULTRAM) tablet 50 mg, 50 mg, Oral, Q6H PRN, Oralia Manis, MD, 50 mg at 03/12/19 2302    ALLERGIES   Patient has no known allergies.     REVIEW OF SYSTEMS    Review of Systems:  Gen:  Denies  fever, sweats, chills weigh loss  HEENT: Denies blurred vision, double vision, ear pain, eye pain, hearing loss, nose bleeds, sore throat Cardiac:  No dizziness, chest pain or heaviness, chest tightness,edema Resp:   Denies cough or sputum porduction, shortness of breath,wheezing, hemoptysis,  Gi: Denies swallowing difficulty, stomach pain, nausea or vomiting,  diarrhea, constipation, bowel incontinence Gu:  Denies bladder incontinence, burning urine Ext:   Denies Joint pain, stiffness or swelling Skin: Denies  skin rash, easy bruising or bleeding or hives Endoc:  Denies polyuria, polydipsia , polyphagia or weight change Psych:   Denies depression, insomnia or hallucinations   Other:  All other systems negative   VS: BP 106/77 (BP Location: Left Arm)    Pulse 99    Temp 98.1 F (36.7 C) (Oral)    Resp 20    Ht  (1.702 m)    Wt 127.2 kg    SpO2 97%    BMI 43.92 kg/m      PHYSICAL EXAM    GENERAL:NAD, no fevers, chills, no weakness no fatigue HEAD: Normocephalic, atraumatic.  EYES: Pupils equal, round, reactive to light. Extraocular muscles intact. No scleral icterus.  MOUTH: Moist mucosal membrane. Dentition intact. No abscess noted.  EAR, NOSE, THROAT: Clear without exudates. No external lesions.  NECK: Supple. No thyromegaly. No nodules. No JVD.  PULMONARY: CTAB with decreased BS bilaterally  CARDIOVASCULAR: S1 and S2. Regular rate and rhythm. No murmurs, rubs, or gallops. No edema. Pedal pulses 2+ bilaterally.  GASTROINTESTINAL: Soft, nontender, nondistended. No masses. Positive bowel sounds. No hepatosplenomegaly.  MUSCULOSKELETAL: No swelling, clubbing, or edema. Range of motion full in all extremities.  NEUROLOGIC: Cranial nerves II through XII are intact. No gross focal neurological deficits. Sensation intact. Reflexes intact.  SKIN: No ulceration, lesions, rashes, or cyanosis. Skin warm and dry. Turgor intact.  PSYCHIATRIC: Mood, affect within normal limits. The patient is awake, alert and oriented x 3. Insight, judgment intact.       IMAGING    Dg Chest 2 View  Result Date: 03/12/2019 CLINICAL DATA:  Hypoxia. EXAM: CHEST - 2 VIEW COMPARISON:  Radiographs and CT scan of March 10, 2019. FINDINGS: Stable cardiomegaly. No pneumothorax or pleural effusion is noted. Both lungs are clear. The visualized skeletal structures are  unremarkable. IMPRESSION: No active cardiopulmonary disease. Electronically Signed   By: Lupita Raider M.D.   On: 03/12/2019 10:14   Dg Chest 2 View  Result Date: 03/10/2019 CLINICAL DATA:  Shortness of breath EXAM: CHEST - 2 VIEW COMPARISON:  Chest x-ray July 28, 2018.  Chest CT February 17, 2019 FINDINGS: Cardiomediastinal silhouette is unchanged. Cardiomegaly. No pneumothorax. Mild atelectasis in the right base. No suspicious infiltrates. No overt edema. IMPRESSION: No active cardiopulmonary disease. Electronically Signed   By: Gerome Sam III M.D   On: 03/10/2019 16:49   Ct Head Wo Contrast  Result Date: 03/12/2019 CLINICAL DATA:  Confusion. Drowsy and fatigued. IV antibiotics for pneumonia. 4 liters O2. EXAM: CT HEAD WITHOUT CONTRAST TECHNIQUE: Contiguous axial images were obtained from  the base of the skull through the vertex without intravenous contrast. COMPARISON:  None. FINDINGS: Brain: No evidence of acute infarction, hemorrhage, hydrocephalus, extra-axial collection or mass lesion/mass effect. Vascular: No hyperdense vessel or unexpected calcification. Skull: Normal. Negative for fracture or focal lesion. Sinuses/Orbits: No acute finding. Other: None. IMPRESSION: Normal CT evaluation of the head. Electronically Signed   By: Norva PavlovElizabeth  Brown M.D.   On: 03/12/2019 18:05   Ct Angio Chest Pe W And/or Wo Contrast  Result Date: 03/10/2019 CLINICAL DATA:  Acute on chronic shortness of breath. History of pulmonary embolus. PE suspected, high pretest prob EXAM: CT ANGIOGRAPHY CHEST WITH CONTRAST TECHNIQUE: Multidetector CT imaging of the chest was performed using the standard protocol during bolus administration of intravenous contrast. Multiplanar CT image reconstructions and MIPs were obtained to evaluate the vascular anatomy. CONTRAST:  100mL ISOVUE-370 IOPAMIDOL (ISOVUE-370) INJECTION 76% COMPARISON:  Chest radiograph earlier this day. Most recent prior CT 3 weeks ago 02/17/2019 FINDINGS:  Cardiovascular: No acute filling defects in the pulmonary arteries. Improving chronic nonocclusive clot in the right interlobar pulmonary artery and chronic web in the right lower lobe pulmonary artery detailed evaluation of the lung bases obscured by breathing motion artifact. Chronically dilated main pulmonary artery of 4.9 cm. Multi chamber cardiomegaly. Chronic straightening of the intraventricular septum and right heart dilatation. Progressive contrast reflux into the hepatic veins and IVC. Mediastinum/Nodes: Small left hilar lymph nodes abutting the main pulmonary artery, likely reactive. Additional small paratracheal nodes, not enlarged by size criteria. No thyroid nodule. Esophagus is decompressed. Lungs/Pleura: Breathing motion artifact limits assessment. New scattered heterogeneous and nodular opacities in the right greater than left lung, for example image 62 series 6 in the right lower lobe, image 40 series 6 in the right upper lobe. Left apical nodular opacities, image 18 series 6. No definite septal thickening. No pleural fluid. Chronic scarring in the periphery of the right middle lobe. Upper Abdomen: Chronic high-density in the gastric cardia, possible debris within the gastric diverticulum, unchanged from prior exams. Known gallstone. Known right adrenal myelolipoma. Musculoskeletal: There are no acute or suspicious osseous abnormalities. Review of the MIP images confirms the above findings. IMPRESSION: 1. No acute pulmonary embolus. 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 2. Progressive contrast refluxing into the hepatic veins and IVC consistent with increasing right heart dysfunction. Chronic pulmonary arterial hypertension. Multi chamber cardiomegaly. 3. Scattered heterogeneous and nodular opacities in both lungs, likely infectious. Electronically Signed   By: Narda RutherfordMelanie  Sanford M.D.   On: 03/10/2019 22:31   Ct Angio Chest Pe W And/or Wo  Contrast  Result Date: 02/17/2019 CLINICAL DATA:  Hypoxia.  New right lower extremity DVT. EXAM: CT ANGIOGRAPHY CHEST WITH CONTRAST TECHNIQUE: Multidetector CT imaging of the chest was performed using the standard protocol during bolus administration of intravenous contrast. Multiplanar CT image reconstructions and MIPs were obtained to evaluate the vascular anatomy. CONTRAST:  75mL OMNIPAQUE IOHEXOL 350 MG/ML SOLN COMPARISON:  CTA chest dated November 19, 2018. FINDINGS: Cardiovascular: Satisfactory opacification of the pulmonary arteries to the segmental level. No evidence of acute pulmonary embolism. Interval decrease in size of the peripheral nonocclusive thrombus in the right interlobar pulmonary artery with unchanged small web like defects at the right lower lobe segmental pulmonary artery origins. Unchanged severe dilatation of the main pulmonary artery with mild cardiomegaly, right heart enlargement, and reflux of contrast into the intrahepatic veins. No pericardial effusion. No thoracic aortic aneurysm. Mediastinum/Nodes: No enlarged mediastinal, hilar, or axillary lymph nodes.  Thyroid gland, trachea, and esophagus demonstrate no significant findings. Lungs/Pleura: No focal consolidation, pleural effusion, or pneumothorax. Unchanged scarring in the right middle lobe. No suspicious pulmonary nodule. Upper Abdomen: No acute abnormality. Unchanged cholelithiasis and small right adrenal myelolipoma. Musculoskeletal: No chest wall abnormality. No acute or significant osseous findings. Review of the MIP images confirms the above findings. IMPRESSION: 1.  No evidence of acute pulmonary embolism. 2. 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 3. Unchanged pulmonary arterial hypertension and right heart dysfunction. Electronically Signed   By: Titus Dubin M.D.   On:  02/17/2019 18:03   US Venous Img Lower Bilateral  Result Date: 03/11/2019 CLINICAL DATA:  History of prior DVT with persistent bilateral lower extremity edema. Evaluate for acute or chronic DVT. EXAM: BILATERAL LOWER EXTREMITY VENOUS DOPPLER ULTRASOUND TECHNIQUE: Gray-scale sonography with graded compression, as well as color Doppler and duplex ultrasound were performed to evaluate the lower extremity deep venous systems from the level of the common femoral vein and including the common femoral, femoral, profunda femoral, popliteal and calf veins including the posterior tibial, peroneal and gastrocnemius veins when visible. The superficial great saphenous vein was also interrogated. Spectral Doppler was utilized to evaluate flow at rest and with distal augmentation maneuvers in the common femoral, femoral and popliteal veins. COMPARISON:  Left lower extremity venous Doppler ultrasound-03/04/2019; 11/19/2018 FINDINGS: RIGHT LOWER EXTREMITY Common Femoral Vein: No evidence of thrombus. Normal compressibility, respiratory phasicity and response to augmentation. Saphenofemoral Junction: No evidence of thrombus. Normal compressibility and flow on color Doppler imaging. Profunda Femoral Vein: No evidence of thrombus. Normal compressibility and flow on color Doppler imaging. Femoral Vein: No evidence of thrombus. Normal compressibility, respiratory phasicity and response to augmentation. Popliteal Vein: No evidence of thrombus. Normal compressibility, respiratory phasicity and response to augmentation. Calf Veins: Appears patent where imaged. Superficial Great Saphenous Vein: No evidence of thrombus. Normal compressibility. Venous Reflux:  None. Other Findings:  None. LEFT LOWER EXTREMITY Common Femoral Vein: No evidence of thrombus. Normal compressibility, respiratory phasicity and response to augmentation. Saphenofemoral Junction: No evidence of thrombus. Normal compressibility and flow on color Doppler imaging.  Profunda Femoral Vein: No evidence of thrombus. Normal compressibility and flow on color Doppler imaging. Femoral Vein: No evidence of thrombus. Normal compressibility, respiratory phasicity and response to augmentation. Popliteal Vein: No evidence of thrombus. Normal compressibility, respiratory phasicity and response to augmentation. Calf Veins: Appears patent where imaged. Superficial Great Saphenous Vein: Not imaged on the present examination. Venous Reflux:  None. Other Findings:  None. IMPRESSION: 1. No definite evidence of acute or chronic DVT within either lower extremity with special attention paid to the left femoral vein. 2. Left greater saphenous vein was not imaged the present examination though was noted to contain occlusive superficial thrombophlebitis on the 03/04/2019 examination. Electronically Signed   By: Sandi Mariscal M.D.   On: 03/11/2019 14:19   US Venous Img Lower Unilateral Left  Result Date: 03/04/2019 CLINICAL DATA:  Swelling and pain EXAM: Left LOWER EXTREMITY VENOUS DOPPLER ULTRASOUND TECHNIQUE: Gray-scale sonography with graded compression, as well as color Doppler and duplex ultrasound were performed to evaluate the lower extremity deep venous systems from the level of the common femoral vein and including the common femoral, femoral, profunda femoral, popliteal and calf veins including the posterior tibial, peroneal and gastrocnemius veins when visible. The superficial great saphenous vein was also interrogated. Spectral Doppler was  utilized to evaluate flow at rest and with distal augmentation maneuvers in the common femoral, femoral and popliteal veins. COMPARISON:  November 19, 2018 FINDINGS: Contralateral Common Femoral Vein: Respiratory phasicity is normal and symmetric with the symptomatic side. No evidence of thrombus. Normal compressibility. Common Femoral Vein: No evidence of thrombus. Normal compressibility, respiratory phasicity and response to augmentation. Saphenofemoral  Junction: No evidence of thrombus. Normal compressibility and flow on color Doppler imaging. Profunda Femoral Vein: No evidence of thrombus. Normal compressibility and flow on color Doppler imaging. Femoral Vein: There is nonocclusive thrombus throughout the femoral vein. The thrombus appears to be located in an eccentric position, favoring a chronic thrombus. There is occlusive thrombus in the duplicated femoral vein in the mid and distal portions. Popliteal Vein: No evidence of thrombus. Normal compressibility, respiratory phasicity and response to augmentation. Calf Veins: No evidence of thrombus. Normal compressibility and flow on color Doppler imaging. The peroneal vein was not visualized. Superficial Great Saphenous Vein: There is thrombus within the great saphenous vein. The thrombus is occlusive in the distal portion. Venous Reflux:  None. Other Findings:  There is lower extremity edema. IMPRESSION: 1. Chronic appearing nonocclusive thrombus throughout the left femoral vein. 2. Occlusive and near occlusive thrombus throughout the left great saphenous vein, a superficial vein of the lower extremity. 3. Nonvisualization of the peroneal vein. Chronic occlusion cannot be excluded. Electronically Signed   By: Katherine Mantlehristopher  Green M.D.   On: 03/04/2019 19:11   [place] Venous Img Lower Unilateral Right  Result Date: 02/17/2019 CLINICAL DATA:  Right leg pain.  History of DVT left leg. EXAM: RIGHT LOWER EXTREMITY VENOUS DOPPLER ULTRASOUND TECHNIQUE: Gray-scale sonography with graded compression, as well as color Doppler and duplex ultrasound were performed to evaluate the lower extremity deep venous systems from the level of the common femoral vein and including the common femoral, femoral, profunda femoral, popliteal and calf veins including the posterior tibial, peroneal and gastrocnemius veins when visible. The superficial great saphenous vein was also interrogated. Spectral Doppler was utilized to evaluate flow at  rest and with distal augmentation maneuvers in the common femoral, femoral and popliteal veins. COMPARISON:  Left lower extremity color flow duplex Doppler 11/19/2018. FINDINGS: Contralateral Common Femoral Vein: Respiratory phasicity is normal and symmetric with the symptomatic side. No evidence of thrombus. Normal compressibility. Common Femoral Vein: No evidence of thrombus. Normal compressibility, respiratory phasicity and response to augmentation. Saphenofemoral Junction: No evidence of thrombus. Normal compressibility and flow on color Doppler imaging. Profunda Femoral Vein: No evidence of thrombus. Normal compressibility and flow on color Doppler imaging. Femoral Vein: No evidence of thrombus. Normal compressibility, respiratory phasicity and response to augmentation. Popliteal Vein: Occlusive thrombus noted in the popliteal vein. Calf Veins: Occlusive thrombus noted in the peroneal veins. Superficial Great Saphenous Vein: No evidence of thrombus. Normal compressibility. Other Findings:  None. IMPRESSION: Occlusive thrombus noted in the popliteal vein and peroneal veins. Electronically Signed   By: Maisie Fushomas  Register   On: 02/17/2019 14:30         ASSESSMENT/PLAN    Dyspnea on exertion      -  Negative for PE per CT chest      - patient is smoking actively  - counseling provided for smoking cessation      -no hx of COPD and no evidence of emphysema on  CT chest      - he has polycythemia with hx of DVTs despite full anticoagulation , would suggest heme/onc eval      -  on IV Rocephin/zithromax initially then t transitioned to oral cefuroxime and zithromax to finish CAP course.      Morbid obesity    - counseling for dietary changes to decrease weight as this causes atelectasis and dyspnea   OSA    - on CPAP        Thank you for allowing me to participate in the care of this patient.   Patient/Family are satisfied with care plan and all questions have been answered.  This document  was prepared using Dragon voice recognition software and may include unintentional dictation errors.     Vida RiggerFuad Koron Godeaux, M.D.  Division of Pulmonary & Critical Care Medicine  [facility]

## 2019-03-13 NOTE — Progress Notes (Signed)
Resting quietly.  Wearing his oxygen.

## 2019-03-13 NOTE — Progress Notes (Signed)
Informed by nurse tech that patient's O2 sat is 84%.  This nurse talked with pt. to educate on the importance of wearing oxygen and the side of effects of low oxygen levels in the blood. Voiced concerns to pt.about maintaining oxygen levels. Pt. continues to refuse to apply nasal cannula. Encouraged pt. to notify nurse when needed. Will continue to monitor.

## 2019-03-13 NOTE — Progress Notes (Signed)
[employer] - [place] at [facility]   PATIENT NAME: Matthew CrankerKelly Engdahl    MR#:  [id number]  DATE OF BIRTH:  07/04/1971  SUBJECTIVE:  CHIEF COMPLAINT:   Chief Complaint  Patient presents with  . Shortness of Breath  . Cough   Came with cough and fever with palpitation and tachycardia. Noted to have A. fib with RVR and after starting Cardizem drip heart rate is under control.  Started on antibiotic treatment for pneumonia. Patient was fully alert and completely oriented today.  His oxygen requirement has also came down since yesterday.  REVIEW OF SYSTEMS:  CONSTITUTIONAL: No fever, fatigue or weakness.  EYES: No blurred or double vision.  EARS, NOSE, AND THROAT: No tinnitus or ear pain.  RESPIRATORY: No cough, shortness of breath, wheezing or hemoptysis.  CARDIOVASCULAR: No chest pain, orthopnea, edema.  GASTROINTESTINAL: No nausea, vomiting, diarrhea or abdominal pain.  GENITOURINARY: No dysuria, hematuria.  ENDOCRINE: No polyuria, nocturia,  HEMATOLOGY: No anemia, easy bruising or bleeding SKIN: No rash or lesion. MUSCULOSKELETAL: No joint pain or arthritis.   NEUROLOGIC: No tingling, numbness, weakness.  PSYCHIATRY: No anxiety or depression.   ROS  DRUG ALLERGIES:  No Known Allergies  VITALS:  Blood pressure 106/77, pulse 99, temperature 98.1 F (36.7 C), temperature source Oral, resp. rate 20, height 5\' 7"  (1.702 m), weight 127.2 kg, SpO2 (!) 87 %.  PHYSICAL EXAMINATION:  GENERAL:  48 y.o.-year-old patient lying in the bed with no acute distress.  EYES: Pupils equal, round, reactive to light and accommodation. No scleral icterus. Extraocular muscles intact.  HEENT: Head atraumatic, normocephalic. Oropharynx and nasopharynx clear.  NECK:  Supple, no jugular venous distention. No thyroid enlargement, no tenderness.  LUNGS: Normal breath sounds bilaterally, no wheezing, have some crepitation. No use of accessory muscles of respiration.  CARDIOVASCULAR: S1,  S2 normal. No murmurs, rubs, or gallops.  ABDOMEN: Soft, nontender, nondistended. Bowel sounds present. No organomegaly or mass.  EXTREMITIES: No pedal edema, cyanosis, or clubbing.  Bilateral leg swelling more than on left. NEUROLOGIC: Cranial nerves II through XII are intact. Muscle strength 4/5 in all extremities. Sensation intact. Gait not checked.  PSYCHIATRIC: The patient is alert and oriented x 3.  SKIN: No obvious rash, lesion, or ulcer.   Physical Exam LABORATORY PANEL:   CBC Recent Labs  Lab 03/13/19 0903  WBC 9.7  HGB 17.5*  HCT 55.7*  PLT 164   ------------------------------------------------------------------------------------------------------------------  Chemistries  Recent Labs  Lab 03/10/19 1940 03/13/19 0903  NA  --  137  K  --  4.2  CL  --  95*  CO2  --  35*  GLUCOSE  --  128*  BUN  --  8  CREATININE  --  0.55*  CALCIUM  --  8.5*  AST 18  --   ALT 14  --   ALKPHOS 123  --   BILITOT 0.5  --    ------------------------------------------------------------------------------------------------------------------  Cardiac Enzymes No results for input(s): TROPONINI in the last 168 hours. ------------------------------------------------------------------------------------------------------------------  RADIOLOGY:  Dg Chest 2 View  Result Date: 03/12/2019 CLINICAL DATA:  Hypoxia. EXAM: CHEST - 2 VIEW COMPARISON:  Radiographs and CT scan of March 10, 2019. FINDINGS: Stable cardiomegaly. No pneumothorax or pleural effusion is noted. Both lungs are clear. The visualized skeletal structures are unremarkable. IMPRESSION: No active cardiopulmonary disease. Electronically Signed   By: Lupita RaiderJames  Green Jr M.D.   On: 03/12/2019 10:14   Ct Head Wo Contrast  Result Date: 03/12/2019 CLINICAL DATA:  Confusion.  Drowsy and fatigued. IV antibiotics for pneumonia. 4 liters O2. EXAM: CT HEAD WITHOUT CONTRAST TECHNIQUE: Contiguous axial images were obtained from the base of the  skull through the vertex without intravenous contrast. COMPARISON:  None. FINDINGS: Brain: No evidence of acute infarction, hemorrhage, hydrocephalus, extra-axial collection or mass lesion/mass effect. Vascular: No hyperdense vessel or unexpected calcification. Skull: Normal. Negative for fracture or focal lesion. Sinuses/Orbits: No acute finding. Other: None. IMPRESSION: Normal CT evaluation of the head. Electronically Signed   By: Nolon Nations M.D.   On: 03/12/2019 18:05    ASSESSMENT AND PLAN:   Active Problems:   Bilateral pneumonia  1.  New onset atrial fibrillation with RVR - Continue Xarelto - Continue Cardizem infusion as started in the emergency room-now converted to sinus rhythm,switch to oral long-acting Cardizem and taper the IV drip off. - Echocardiogram-ejection fraction 60% with slight dilation of the right side. - Trend troponin levels- stable. - Cardiology consulted for further evaluation and recommendations-agreed with current management and no new changes suggested. -Telemetry monitoring  2.  Bilateral pneumonia - IV Rocephin and azithromycin - O2 per nasal cannula - DuoNeb every 6 as needed - Blood culture-negative so far Called pulmonary consult who suggested mostly this is due to sleep apnea and pulmonary hypertension playing a role.  3. 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  Obstructive sleep apnea - CPAP nightly and during hours of sleep  5.  Tobacco abuse - We have discussed the importance of tobacco cessation for 4 minutes.  6.  Altered mental status As patient has history of atrial fibrillation, CT had checked and it was negative.  Patient is completely alert and oriented today.  7.  Polycythemia Talk to hematologist he suggested to send test for erythropoietin and Jak 2, and they will see in the clinic next week.  DVT and PPI prophylaxis initiated  As  patient is still requiring 2 L of oxygen, I spoke to him and he agreed to stay in the hospital for tonight and try again tomorrow to wean off to room air and possible discharge  All the records are reviewed and case discussed with Care Management/Social Workerr. Management plans discussed with the patient, family and they are in agreement.  CODE STATUS: Full.  TOTAL TIME TAKING CARE OF THIS PATIENT: 35 minutes.    POSSIBLE D/C IN 1-2 DAYS, DEPENDING ON CLINICAL CONDITION.   Vaughan Basta M.D on 03/13/2019   Between 7am to 6pm - Pager - [phone]  After 6pm go to [email or web address] - password [email or web address] [facility]  Office  [phone]  CC: Primary care physician; Patient, No Pcp Per  Note: This dictation was prepared with Dragon dictation along with smaller phrase technology. Any transcriptional errors that result from this process are unintentional.

## 2019-03-13 NOTE — Plan of Care (Signed)
  Problem: Clinical Measurements: Goal: Ability to maintain clinical measurements within normal limits will improve Outcome: Progressing Goal: Will remain free from infection Outcome: Progressing Goal: Diagnostic test results will improve Outcome: Progressing   Problem: Elimination: Goal: Will not experience complications related to urinary retention Outcome: Progressing   Problem: Pain Managment: Goal: General experience of comfort will improve Outcome: Progressing   Problem: Safety: Goal: Ability to remain free from injury will improve Outcome: Progressing   Problem: Skin Integrity: Goal: Risk for impaired skin integrity will decrease Outcome: Progressing   

## 2019-03-13 NOTE — Progress Notes (Signed)
Patient refuses to use the flutter value.  Refuses to wear the nasal canula.  Is belligerent when staff enter the room.  Alerted Dr. Marthann Schiller.  He says he can stay off the oxgen.  He will reevaluate in the morning.

## 2019-03-13 NOTE — Progress Notes (Signed)
Patient declined CPAP at this time. Patient resting comfortably in bed on [place]. No distress noted at this time.

## 2019-03-14 LAB — ERYTHROPOIETIN: Erythropoietin: 22.8 m[IU]/mL — ABNORMAL HIGH (ref 2.6–18.5)

## 2019-03-14 MED ORDER — DILTIAZEM HCL ER COATED BEADS 120 MG PO CP24: 120.0000 mg | ORAL_CAPSULE | Freq: Every day | ORAL | 0 refills | Status: DC

## 2019-03-14 MED ORDER — CEFUROXIME AXETIL 500 MG PO TABS: 500.0000 mg | ORAL_TABLET | Freq: Two times a day (BID) | ORAL | 0 refills | Status: AC

## 2019-03-14 NOTE — Progress Notes (Signed)
Went over discharge instructions with the patient including medications and follow-up appointment. But per patient we need to go over it with her wife, I told Ashly, RN about this. Per patient wife will be here around 3:30pm because she's at work.

## 2019-03-14 NOTE — Progress Notes (Signed)
Spoke with pt. about wearing nasal cannula due to O2 sat results. Pt. continues to refuse to comply.  No signs of acute distress or shortness of breath noted. Pt. educated on the importance of wearing O2. Will continue to monitor.

## 2019-03-14 NOTE — Progress Notes (Signed)
RN asked RT to assess patient's oxygen saturation due to noncompliance with [place]. Patient found with [place] off and SAT on room air 81%. Patient again declined use of CPAP at night. No distress was noted at this time. Patient able to speak in full sentences and denies shortness of breath. [place] placed back on patient, however, patient stated "I cant promise I will keep it on." Patient educated on importance of compliance and maintaining proper oxygen requirements. RN aware. Will continue to monitor.

## 2019-03-14 NOTE — Discharge Summary (Signed)
[place] at [place] NAME: Matthew [name]    MR#:  [id number]  DATE OF BIRTH:  December 11, 1970  DATE OF ADMISSION:  03/10/2019 ADMITTING PHYSICIAN: Christel Mormon, MD  DATE OF DISCHARGE: 03/14/2019   PRIMARY CARE PHYSICIAN: Patient, No Pcp Per    ADMISSION DIAGNOSIS:  Edema [R60.9] New onset atrial fibrillation (HCC) [I48.91] Acute respiratory failure with hypoxia ([place]) [J96.01]  DISCHARGE DIAGNOSIS:  Active Problems:   Bilateral pneumonia   SECONDARY DIAGNOSIS:   Past Medical History:  Diagnosis Date  . Hx of blood clots   . Sleep apnea     HOSPITAL COURSE:   1.New onset atrial fibrillation with RVR -Continue Xarelto -Continue Cardizem infusion as started in the emergency room-now converted to sinus rhythm,switch to oral long-acting Cardizem and taper the IV drip off. -Echocardiogram-ejection fraction 60% with slight dilation of the right side. -Trend troponin levels- stable. -Cardiology consulted for further evaluation and recommendations-agreed with current management and no new changes suggested. -Telemetry monitoring  2. Bilateral pneumonia -IV Rocephin and azithromycin -O2 per nasal cannula -DuoNeb every 6 as needed -Blood culture-negative so far Called pulmonary consult who suggested mostly this is due to sleep apnea and pulmonary hypertension playing a role. -Now improved and patient is on room air.  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 Obstructive sleep apnea -CPAP nightly and during hours of sleep  5. Tobacco abuse -We have discussed the importance of tobacco cessation for 4 minutes.  6.  Altered mental status As patient has history of atrial fibrillation, CT had checked and it was negative.  Patient is completely alert and oriented today.  7.  Polycythemia Talk to hematologist he suggested to  send test for erythropoietin and Jak 2, and they will see in the clinic next week.  DVT and PPI prophylaxis initiated   DISCHARGE CONDITIONS:   Stable  CONSULTS OBTAINED:  Treatment Team:  Corey Skains, MD Ottie Glazier, MD  DRUG ALLERGIES:  No Known Allergies  DISCHARGE MEDICATIONS:   Allergies as of 03/14/2019   No Known Allergies     Medication List    STOP taking these medications   escitalopram 10 MG tablet Commonly known as: LEXAPRO   furosemide 20 MG tablet Commonly known as: LASIX     TAKE these medications   albuterol 1.25 MG/3ML nebulizer solution Commonly known as: ACCUNEB Take 1 ampule by nebulization every 6 (six) hours as needed for wheezing.   cefUROXime 500 MG tablet Commonly known as: CEFTIN Take 1 tablet (500 mg total) by mouth 2 (two) times daily with a meal for 3 days.   diltiazem 120 MG 24 hr capsule Commonly known as: CARDIZEM CD Take 1 capsule (120 mg total) by mouth daily. Start taking on: March 15, 2019   rivaroxaban 20 MG Tabs tablet Commonly known as: XARELTO Take 20 mg by mouth daily with supper.   triamcinolone cream 0.1 % Commonly known as: KENALOG Apply 1 application topically 2 (two) times daily.        DISCHARGE INSTRUCTIONS:    Follow with hematology and Pulm clinic in 1-2 weeks.  If you experience worsening of your admission symptoms, develop shortness of breath, life threatening emergency, suicidal or homicidal thoughts you must seek medical attention immediately by calling 911 or calling your MD immediately  if symptoms less severe.  You Must read complete instructions/literature along with all the  possible adverse reactions/side effects for all the Medicines you take and that have been prescribed to you. Take any new Medicines after you have completely understood and accept all the possible adverse reactions/side effects.   Please note  You were cared for by a hospitalist during your hospital stay. If  you have any questions about your discharge medications or the care you received while you were in the hospital after you are discharged, you can call the unit and asked to speak with the hospitalist on call if the hospitalist that took care of you is not available. Once you are discharged, your primary care physician will handle any further medical issues. Please note that NO REFILLS for any discharge medications will be authorized once you are discharged, as it is imperative that you return to your primary care physician (or establish a relationship with a primary care physician if you do not have one) for your aftercare needs so that they can reassess your need for medications and monitor your lab values.    Today   CHIEF COMPLAINT:   Chief Complaint  Patient presents with  . Shortness of Breath  . Cough    HISTORY OF PRESENT ILLNESS:  Matthew CrankerKelly [name]  is a 48 y.o. 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  Patient also has a history of continued tobacco abuse.  He is currently taking Xarelto.  He presents to the emergency room reporting a 2-day history of increased shortness of breath and persistent nonproductive cough.  Symptoms are made worse when lying flat.  He is short of breath with minimal exertion such as walking from room to room in his home.  He has also noticed increased edema of his bilateral lower extremities particularly worse in his right thigh.  He denies experiencing fevers, chills, nausea, vomiting, hematemesis, hematochezia, melena, abdominal pain, diarrhea, or chest pain.  BNP is 41.  EKG demonstrates atrial fibrillation with RVR at rate of 132.  Atrial fibrillation is new when compared to previous EKG.  Patient was started on diltiazem infusion with continued Xarelto in the emergency room.  Chest x-ray demonstrates no acute pulmonary embolus.  There is slight decrease in chronic nonocclusive clot  in the right interlobar pulmonary artery.  There is also progressive contrast refluxing into the hepatic veins and IVC consistent with increasing right heart dysfunction.  There are scattered heterogenous and nodular opacities in both lungs likely infectious.  He has been admitted to the hospitalist service for further management.    VITAL SIGNS:  Blood pressure 105/60, pulse (!) 52, temperature 98.5 F (36.9 C), temperature source Oral, resp. rate 18, height 5\' 7"  (1.702 m), weight 127.2 kg, SpO2 94 %.  I/O:    Intake/Output Summary (Last 24 hours) at 03/14/2019 1224 Last data filed at 03/14/2019 1102 Gross per 24 hour  Intake -  Output 2525 ml  Net -2525 ml    PHYSICAL EXAMINATION:  GENERAL:  48 y.o.-year-old patient lying in the bed with no acute distress.  EYES: Pupils equal, round, reactive to light and accommodation. No scleral icterus. Extraocular muscles intact.  HEENT: Head atraumatic, normocephalic. Oropharynx and nasopharynx clear.  NECK:  Supple, no jugular venous distention. No thyroid enlargement, no tenderness.  LUNGS: Normal breath sounds bilaterally, no wheezing, rales,rhonchi or crepitation. No use of accessory muscles of respiration.  CARDIOVASCULAR: S1, S2 normal. No murmurs, rubs, or gallops.  ABDOMEN: Soft, non-tender, non-distended. Bowel sounds present. No  organomegaly or mass.  EXTREMITIES: No pedal edema, cyanosis, or clubbing.  NEUROLOGIC: Cranial nerves II through XII are intact. Muscle strength 5/5 in all extremities. Sensation intact. Gait not checked.  PSYCHIATRIC: The patient is alert and oriented x 3.  SKIN: No obvious rash, lesion, or ulcer.   DATA REVIEW:   CBC Recent Labs  Lab 03/13/19 0903  WBC 9.7  HGB 17.5*  HCT 55.7*  PLT 164    Chemistries  Recent Labs  Lab 03/10/19 1940 03/13/19 0903  NA  --  137  K  --  4.2  CL  --  95*  CO2  --  35*  GLUCOSE  --  128*  BUN  --  8  CREATININE  --  0.55*  CALCIUM  --  8.5*  AST 18   --   ALT 14  --   ALKPHOS 123  --   BILITOT 0.5  --     Cardiac Enzymes No results for input(s): TROPONINI in the last 168 hours.  Microbiology Results  Results for orders placed or performed during the hospital encounter of 03/10/19  SARS Coronavirus 2 (CEPHEID - Performed in [facility] hospital lab), Hosp Order     Status: None   Collection Time: 03/10/19  7:40 PM   Specimen: Nasopharyngeal Swab  Result Value Ref Range Status   SARS Coronavirus 2 NEGATIVE NEGATIVE Final    Comment: (NOTE) If result is NEGATIVE SARS-CoV-2 target nucleic acids are NOT DETECTED. The SARS-CoV-2 RNA is generally detectable in upper and lower  respiratory specimens during the acute phase of infection. The lowest  concentration of SARS-CoV-2 viral copies this assay can detect is 250  copies / mL. A negative result does not preclude SARS-CoV-2 infection  and should not be used as the sole basis for treatment or other  patient management decisions.  A negative result may occur with  improper specimen collection / handling, submission of specimen other  than nasopharyngeal swab, presence of viral mutation(s) within the  areas targeted by this assay, and inadequate number of viral copies  (<250 copies / mL). A negative result must be combined with clinical  observations, patient history, and epidemiological information. If result is POSITIVE SARS-CoV-2 target nucleic acids are DETECTED. The SARS-CoV-2 RNA is generally detectable in upper and lower  respiratory specimens dur ing the acute phase of infection.  Positive  results are indicative of active infection with SARS-CoV-2.  Clinical  correlation with patient history and other diagnostic information is  necessary to determine patient infection status.  Positive results do  not rule out bacterial infection or co-infection with other viruses. If result is PRESUMPTIVE POSTIVE SARS-CoV-2 nucleic acids MAY BE PRESENT.   A presumptive positive result  was obtained on the submitted specimen  and confirmed on repeat testing.  While 2019 novel coronavirus  (SARS-CoV-2) nucleic acids may be present in the submitted sample  additional confirmatory testing may be necessary for epidemiological  and / or clinical management purposes  to differentiate between  SARS-CoV-2 and other Sarbecovirus currently known to infect humans.  If clinically indicated additional testing with an alternate test  methodology [phone]([id number]) is advised. The SARS-CoV-2 RNA is generally  detectable in upper and lower respiratory sp ecimens during the acute  phase of infection. The expected result is Negative. Fact Sheet for Patients:  BoilerBrush.com.cyhttps://www.fda.gov/media/136312/download Fact Sheet for Healthcare Providers: https://pope.com/https://www.fda.gov/media/136313/download This test is not yet approved or cleared by the [place] FDA and has been authorized for detection and/or  diagnosis of SARS-CoV-2 by FDA under an Emergency Use Authorization (EUA).  This EUA will remain in effect (meaning this test can be used) for the duration of the COVID-19 declaration under Section 564(b)(1) of the Act, 21 U.S.C. section 360bbb-3(b)(1), unless the authorization is terminated or revoked sooner. Performed at [facility], [address] [place], [place] [address]   Culture, blood (Routine X 2) w Reflex to ID Panel     Status: None (Preliminary result)   Collection Time: 03/11/19  1:14 AM   Specimen: BLOOD  Result Value Ref Range Status   Specimen Description BLOOD RIGHT HAND  Final   Special Requests   Final    BOTTLES DRAWN AEROBIC AND ANAEROBIC Blood Culture results may not be optimal due to an inadequate volume of blood received in culture bottles   Culture   Final    NO GROWTH 3 DAYS Performed at [facility], [address] [place], [place] [address]    Report Status PENDING  Incomplete  MRSA PCR Screening     Status: None   Collection Time: 03/11/19  1:14 AM    Specimen: Nasal Mucosa; Nasopharyngeal  Result Value Ref Range Status   MRSA by PCR NEGATIVE NEGATIVE Final    Comment:        The GeneXpert MRSA Assay (FDA approved for NASAL specimens only), is one component of a comprehensive MRSA colonization surveillance program. It is not intended to diagnose MRSA infection nor to guide or monitor treatment for MRSA infections. Performed at [facility], [address] [place], [place] [address]     RADIOLOGY:  Ct Head Wo Contrast  Result Date: 03/12/2019 CLINICAL DATA:  Confusion. Drowsy and fatigued. IV antibiotics for pneumonia. 4 liters O2. EXAM: CT HEAD WITHOUT CONTRAST TECHNIQUE: Contiguous axial images were obtained from the base of the skull through the vertex without intravenous contrast. COMPARISON:  None. FINDINGS: Brain: No evidence of acute infarction, hemorrhage, hydrocephalus, extra-axial collection or mass lesion/mass effect. Vascular: No hyperdense vessel or unexpected calcification. Skull: Normal. Negative for fracture or focal lesion. Sinuses/Orbits: No acute finding. Other: None. IMPRESSION: Normal CT evaluation of the head. Electronically Signed   By: Norva PavlovElizabeth  Brown M.D.   On: 03/12/2019 18:05    EKG:   Orders placed or performed during the hospital encounter of 03/10/19  . ED EKG  . ED EKG  . EKG      Management plans discussed with the patient, family and they are in agreement.  CODE STATUS:     Code Status Orders  (From admission, onward)         Start     Ordered   03/11/19 0057  Full code  Continuous     03/11/19 0056        Code Status History    Date Active Date Inactive Code Status Order ID Comments User Context   02/17/2019 2013 02/19/2019 1555 Full Code [id number]  Enedina FinnerPatel, Sona, MD ED   11/19/2018 2234 11/20/2018 1643 Full Code [id number]  Oralia ManisWillis, David, MD Inpatient   Advance Care Planning Activity      TOTAL TIME TAKING CARE OF THIS PATIENT: 35 minutes.    Altamese DillingVaibhavkumar Destry Bezdek  M.D on 03/14/2019 at 12:24 PM  Between 7am to 6pm - Pager - [phone]  After 6pm go to [email or web address] - password [email or web address]  [facility] [place] [facility]  Office  [phone]  CC: Primary care physician; Patient, No Pcp Per   Note: This dictation was prepared with Dragon  dictation along with smaller phrase technology. Any transcriptional errors that result from this process are unintentional.

## 2019-03-14 NOTE — Progress Notes (Signed)
Discharge instructions reviewed with patient and wife.

## 2019-03-15 ENCOUNTER — Telehealth: Payer: Self-pay | Admitting: Internal Medicine

## 2019-03-15 DIAGNOSIS — D751 Secondary polycythemia: Secondary | ICD-10-CM

## 2019-03-15 NOTE — Telephone Encounter (Signed)
Discussed with Dr.Vachhani.   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

## 2019-03-16 ENCOUNTER — Other Ambulatory Visit: Payer: Self-pay | Admitting: Gerontology

## 2019-03-16 DIAGNOSIS — R600 Localized edema: Secondary | ICD-10-CM

## 2019-03-16 LAB — CULTURE, BLOOD (ROUTINE X 2): Culture: NO GROWTH

## 2019-03-19 ENCOUNTER — Other Ambulatory Visit: Payer: Self-pay

## 2019-03-19 ENCOUNTER — Ambulatory Visit: Payer: Self-pay | Admitting: Gerontology

## 2019-03-19 DIAGNOSIS — I82409 Acute embolism and thrombosis of unspecified deep veins of unspecified lower extremity: Secondary | ICD-10-CM | POA: Insufficient documentation

## 2019-03-19 DIAGNOSIS — I4891 Unspecified atrial fibrillation: Secondary | ICD-10-CM

## 2019-03-19 DIAGNOSIS — I480 Paroxysmal atrial fibrillation: Secondary | ICD-10-CM | POA: Insufficient documentation

## 2019-03-19 DIAGNOSIS — R7303 Prediabetes: Secondary | ICD-10-CM

## 2019-03-19 DIAGNOSIS — I825Y2 Chronic embolism and thrombosis of unspecified deep veins of left proximal lower extremity: Secondary | ICD-10-CM

## 2019-03-19 NOTE — Telephone Encounter (Signed)
Multiple attempts have been made to reach patient by our referral coordinator to schedule this appointment. Unable to reach patient. Dr. Jacinto Reap - please advise.

## 2019-03-19 NOTE — Progress Notes (Signed)
Established Patient Office Visit  Subjective:  Patient ID: Matthew [name], male    DOB: 01/20/1971  Age: 48 y.o. MRN: [id number]  CC:  Chief Complaint  Patient presents with  . Follow-up    followup from hospital  Patient consents to telephone visit and 2 patient identifiers was used to identify patient.  HPI Matthew [name] presents for follow up after hospital discharge on 03/14/2019 for new onset atrial fibrillation and pneumonia, he finished the course of antibiotics and will continue on 20 mg Xarelto daily and Cardizem 120 mg daily. Echocardiogram was done during hospitalization and ejection fraction was 60 % with slight dilation of the right side, He denies chest pain, palpitation, light headedness, no edema to right lower extremity, hematuria, hematochezia nor active bleeding. He continues to smoke 5 cigarettes and admits the desire to quit. His HgbA1c done 09/10/18 was 5.9 % and he has missed many lab appointments. He denies polyuria, polydipsia , polyphagia, fever, chills and offers no further concerns.  Past Medical History:  Diagnosis Date  . Hx of blood clots   . Sleep apnea     Past Surgical History:  Procedure Laterality Date  . IVC FILTER INSERTION Left 11/24/2018   Procedure: IVC FILTER INSERTION;  Surgeon: Annice Needyew, Jason S, MD;  Location: [facility];  Service: Cardiovascular;  Laterality: Left;  . IVC FILTER INSERTION Right 02/18/2019   Procedure: IVC FILTER INSERTION WITH RIGHT LOWER EXTREMITY VENOUS LYSIS;  Surgeon: Renford DillsSchnier, Gregory G, MD;  Location: [facility];  Service: Cardiovascular;  Laterality: Right;  . IVC FILTER REMOVAL N/A 12/01/2018   Procedure: IVC FILTER REMOVAL;  Surgeon: Annice Needyew, Jason S, MD;  Location: [facility];  Service: Cardiovascular;  Laterality: N/A;  . NO PAST SURGERIES      No family history on file.  Social History   Socioeconomic History  . Marital status: Married    Spouse name: [name]  . Number of children: 4   . Years of education: Not on file  . Highest education level: Not on file  Occupational History  . Not on file  Social Needs  . Financial resource strain: Not hard at all  . Food insecurity    Worry: Never true    Inability: Never true  . Transportation needs    Medical: No    Non-medical: No  Tobacco Use  . Smoking status: Current Every Day Smoker    Packs/day: 0.50    Types: Cigarettes  . Smokeless tobacco: Never Used  Substance and Sexual Activity  . Alcohol use: Yes    Comment: occassional  . Drug use: Never  . Sexual activity: Yes  Lifestyle  . Physical activity    Days per week: Not on file    Minutes per session: Not on file  . Stress: Only a little  Relationships  . Social connections    Talks on phone: More than three times a week    Gets together: Not on file    Attends religious service: Not on file    Active member of club or organization: Not on file    Attends meetings of clubs or organizations: Not on file    Relationship status: Not on file  . Intimate partner violence    Fear of current or ex partner: No    Emotionally abused: No    Physically abused: No    Forced sexual activity: No  Other Topics Concern  . Not on file  Social History  Narrative  . Not on file    Outpatient Medications Prior to Visit  Medication Sig Dispense Refill  . diltiazem (CARDIZEM CD) 120 MG 24 hr capsule Take 1 capsule (120 mg total) by mouth daily. 30 capsule 0  . albuterol (ACCUNEB) 1.25 MG/3ML nebulizer solution Take 1 ampule by nebulization every 6 (six) hours as needed for wheezing.    . rivaroxaban (XARELTO) 20 MG TABS tablet Take 20 mg by mouth daily with supper.    . triamcinolone cream (KENALOG) 0.1 % Apply 1 application topically 2 (two) times daily. (Patient not taking: Reported on 03/19/2019) 30 g 0   No facility-administered medications prior to visit.     No Known Allergies  ROS Review of Systems  Constitutional: Negative.   Respiratory: Negative.    Cardiovascular: Negative.   Genitourinary: Negative.   Skin: Negative.   Neurological: Negative.   Hematological: Negative.   Psychiatric/Behavioral: Negative.       Objective:    Physical Exam No vital sign or PE was done There were no vitals taken for this visit. Wt Readings from Last 3 Encounters:  03/11/19 280 lb 6.4 oz (127.2 kg)  03/04/19 286 lb 9.6 oz (130 kg)  02/18/19 290 lb 2 oz (131.6 kg)     Health Maintenance Due  Topic Date Due  . TETANUS/TDAP  07/09/1989    There are no preventive care reminders to display for this patient.  Lab Results  Component Value Date   TSH 1.636 03/12/2019   Lab Results  Component Value Date   WBC 9.7 03/13/2019   HGB 17.5 (H) 03/13/2019   HCT 55.7 (H) 03/13/2019   MCV 102.2 (H) 03/13/2019   PLT 164 03/13/2019   Lab Results  Component Value Date   NA 137 03/13/2019   K 4.2 03/13/2019   CO2 35 (H) 03/13/2019   GLUCOSE 128 (H) 03/13/2019   BUN 8 03/13/2019   CREATININE 0.55 (L) 03/13/2019   BILITOT 0.5 03/10/2019   ALKPHOS 123 03/10/2019   AST 18 03/10/2019   ALT 14 03/10/2019   PROT 7.4 03/10/2019   ALBUMIN 3.0 (L) 03/10/2019   CALCIUM 8.5 (L) 03/13/2019   ANIONGAP 7 03/13/2019   Lab Results  Component Value Date   CHOL 155 09/10/2018   Lab Results  Component Value Date   HDL 38 (L) 09/10/2018   Lab Results  Component Value Date   LDLCALC 84 09/10/2018   Lab Results  Component Value Date   TRIG 163 (H) 09/10/2018   Lab Results  Component Value Date   CHOLHDL 4.1 09/10/2018   Lab Results  Component Value Date   HGBA1C 5.9 (H) 09/10/2018      Assessment & Plan:     1. Atrial fibrillation, unspecified type ([place]) - He will continue on 20 mg Xarelto and Cardizem 120 mg daily and advised to notify clinic for hematuria, hematochezia and go to the ED for active bleeding.  2. Chronic deep vein thrombosis (DVT) of proximal vein of left lower extremity ([place]) - He will continue on 20 mg Xarelto  daily and advised to notify clinic for hematuria, hematochezia and go to the ED for active bleeding.   3. Prediabetes - He was advised to lose weight, continue on low carb/ non concentrated sweet diet and exercise as tolerated. -  Will recheck HgB A1c, CBC with diff, cMet; lactate dehydrogenase, and JAK2 genotype in Future   Follow-up: Return in about 6 weeks (around 04/30/2019), or if symptoms worsen or  fail to improve.    Jeison Delpilar Jerold Coombe, NP

## 2019-03-24 ENCOUNTER — Telehealth: Payer: Self-pay

## 2019-03-24 NOTE — Telephone Encounter (Signed)
Matthew [name] left voicemail at [facility] stating that his legs needed to be rewrapped. "Leaking water and won't stop."  I spoke with Matthew [name] and she recommended he return to the ER for evaluation and rewrapping.  I called Matthew [name]. He stated he went swimming and the scabs fell off his legs. He knows how to rewrap. But states the water is flowing out of his legs onto his feet. He refused to return to the ER. "They aren't going to do anything for my legs there. Last time I went they kept me for 5 days for pneumonia."   Matthew [name] provided the patient with rewrapping supplies.  Patient will return to the clinic on 04/07/2019 for evaluation.

## 2019-03-26 ENCOUNTER — Encounter (INDEPENDENT_AMBULATORY_CARE_PROVIDER_SITE_OTHER): Payer: Self-pay | Admitting: Nurse Practitioner

## 2019-04-02 ENCOUNTER — Telehealth: Payer: Self-pay | Admitting: Pharmacy Technician

## 2019-04-02 ENCOUNTER — Other Ambulatory Visit: Payer: Self-pay

## 2019-04-02 ENCOUNTER — Encounter: Payer: Self-pay | Admitting: Emergency Medicine

## 2019-04-02 ENCOUNTER — Emergency Department
Admission: EM | Admit: 2019-04-02 | Discharge: 2019-04-02 | Disposition: A | Discharge status: Home / Self Care | Payer: Self-pay | Attending: Emergency Medicine | Admitting: Emergency Medicine

## 2019-04-02 DIAGNOSIS — L600 Ingrowing nail: Secondary | ICD-10-CM | POA: Insufficient documentation

## 2019-04-02 DIAGNOSIS — Z7901 Long term (current) use of anticoagulants: Secondary | ICD-10-CM | POA: Insufficient documentation

## 2019-04-02 DIAGNOSIS — F17210 Nicotine dependence, cigarettes, uncomplicated: Secondary | ICD-10-CM | POA: Insufficient documentation

## 2019-04-02 DIAGNOSIS — Z86718 Personal history of other venous thrombosis and embolism: Secondary | ICD-10-CM | POA: Insufficient documentation

## 2019-04-02 MED ORDER — TRAMADOL HCL 50 MG PO TABS: 50.0000 mg | ORAL_TABLET | Freq: Two times a day (BID) | ORAL | 0 refills | Status: AC | PRN

## 2019-04-02 MED ORDER — SULFAMETHOXAZOLE-TRIMETHOPRIM 800-160 MG PO TABS: 1.0000 | ORAL_TABLET | Freq: Two times a day (BID) | ORAL | 0 refills | Status: DC

## 2019-04-02 NOTE — ED Notes (Signed)
Pt states that starting 2 days ago his big toe on his right leg hurts and that he is unable to put weight on it. Pt states that he wears steel toed boots for work, but has been unable to wear them  His left leg and ankle is swollen and red.

## 2019-04-02 NOTE — ED Triage Notes (Signed)
Pt arrives with concerns over pain and possible infection of his right toe.

## 2019-04-02 NOTE — Discharge Instructions (Addendum)
Follow discharge care instruction take medication as directed until evaluation by podiatrist.

## 2019-04-02 NOTE — ED Provider Notes (Signed)
[facility] Emergency Department Provider Note   ____________________________________________   First MD Initiated Contact with Patient 04/02/19 1126     (approximate)  I have reviewed the triage vital signs and the nursing notes.   HISTORY  Chief Complaint Toe Pain    HPI Matthew [name] is a 48 y.o. male patient presents with pain and edema secondary to ingrown toenail of the right foot.  Patient did pain increased yesterday when his wife stepped on toe.  Patient rates the pain as a 9/10.  Patient described pain is "achy".  No palliative measures prior to arrival.         Past Medical History:  Diagnosis Date  . Hx of blood clots   . Sleep apnea     Patient Active Problem List   Diagnosis Date Noted  . Atrial fibrillation (HCC) 03/19/2019  . DVT (deep venous thrombosis) (HCC) 03/19/2019  . Bilateral pneumonia 03/11/2019  . Acute DVT (deep venous thrombosis) (HCC) 02/17/2019  . DVT of lower extremity (deep venous thrombosis) (HCC) 11/19/2018    Past Surgical History:  Procedure Laterality Date  . IVC FILTER INSERTION Left 11/24/2018   Procedure: IVC FILTER INSERTION;  Surgeon: Annice Needyew, Jason S, MD;  Location: [facility];  Service: Cardiovascular;  Laterality: Left;  . IVC FILTER INSERTION Right 02/18/2019   Procedure: IVC FILTER INSERTION WITH RIGHT LOWER EXTREMITY VENOUS LYSIS;  Surgeon: Renford DillsSchnier, Gregory G, MD;  Location: [facility];  Service: Cardiovascular;  Laterality: Right;  . IVC FILTER REMOVAL N/A 12/01/2018   Procedure: IVC FILTER REMOVAL;  Surgeon: Annice Needyew, Jason S, MD;  Location: [facility];  Service: Cardiovascular;  Laterality: N/A;  . NO PAST SURGERIES      Prior to Admission medications   Medication Sig Start Date End Date Taking? Authorizing Provider  albuterol (ACCUNEB) 1.25 MG/3ML nebulizer solution Take 1 ampule by nebulization every 6 (six) hours as needed for wheezing.    [provider]   diltiazem (CARDIZEM CD) 120 MG 24 hr capsule Take 1 capsule (120 mg total) by mouth daily. 03/15/19   Altamese DillingVachhani, Vaibhavkumar, MD  rivaroxaban (XARELTO) 20 MG TABS tablet Take 20 mg by mouth daily with supper.    [provider]  sulfamethoxazole-trimethoprim (BACTRIM DS) 800-160 MG tablet Take 1 tablet by mouth 2 (two) times daily. 04/02/19   Joni ReiningSmith,  K, PA-C  traMADol (ULTRAM) 50 MG tablet Take 1 tablet (50 mg total) by mouth every 12 (twelve) hours as needed for up to 5 days. 04/02/19 04/07/19  Joni ReiningSmith,  K, PA-C  triamcinolone cream (KENALOG) 0.1 % Apply 1 application topically 2 (two) times daily. Patient not taking: Reported on 03/19/2019 02/04/19   Eulogio BearIloabachie, Chioma E, NP    Allergies Patient has no known allergies.  No family history on file.  Social History Social History   Tobacco Use  . Smoking status: Current Every Day Smoker    Packs/day: 0.50    Types: Cigarettes  . Smokeless tobacco: Never Used  Substance Use Topics  . Alcohol use: Yes    Comment: occassional  . Drug use: Never    Review of Systems Constitutional: No fever/chills Eyes: No visual changes. ENT: No sore throat. Cardiovascular: Denies chest pain. Respiratory: Denies shortness of breath. Gastrointestinal: No abdominal pain.  No nausea, no vomiting.  No diarrhea.  No constipation. Genitourinary: Negative for dysuria. Musculoskeletal: Right great toe pain.   Skin: Negative for rash.  Red and swollen right great toe.  Neurological: Negative for headaches, focal weakness or numbness.   ____________________________________________   PHYSICAL EXAM:  VITAL SIGNS: ED Triage Vitals  Enc Vitals Group     BP 04/02/19 1111 (!) 124/99     Pulse Rate 04/02/19 1111 76     Resp 04/02/19 1111 18     Temp 04/02/19 1111 98.8 F (37.1 C)     Temp Source 04/02/19 1111 Oral     SpO2 04/02/19 1111 94 %     Weight 04/02/19 1109 280 lb (127 kg)     Height 04/02/19 1109 5\' 7"  (1.702 m)     Head  Circumference --      Peak Flow --      Pain Score 04/02/19 1109 9     Pain Loc --      Pain Edu? --      Excl. in GC? --    Constitutional: Alert and oriented. Well appearing and in no acute distress. Neck: No stridor.   Hematological/Lymphatic/Immunilogical: No cervical lymphadenopathy. Cardiovascular: Normal rate, regular rhythm. Grossly normal heart sounds.  Good peripheral circulation. Respiratory: Normal respiratory effort.  No retractions. Lungs CTAB. Gastrointestinal: Soft and nontender. No distention. No abdominal bruits. No CVA tenderness. Musculoskeletal: No lower extremity tenderness nor edema.  No joint effusions. Neurologic:  Normal speech and language. No gross focal neurologic deficits are appreciated. No gait instability. Skin: Edema and erythema medial great toenail of the right foot. Psychiatric: Mood and affect are normal. Speech and behavior are normal.  ____________________________________________   LABS (all labs ordered are listed, but only abnormal results are displayed)  Labs Reviewed - No data to display ____________________________________________  EKG   ____________________________________________  RADIOLOGY  ED MD interpretation:    Official radiology report(s): No results found.  ____________________________________________   PROCEDURES  Procedure(s) performed (including Critical Care):  Procedures   ____________________________________________   INITIAL IMPRESSION / ASSESSMENT AND PLAN / ED COURSE  As part of my medical decision making, I reviewed the following data within the electronic MEDICAL RECORD NUMBER     Patient presents with pain secondary to infected ingrown toenail.  Patient given discharge care instructions.  Patient advised take medication as directed and contact podiatry for definitive evaluation and treatment.    Matthew [name] was evaluated in Emergency Department on 04/02/2019 for the symptoms described in the  history of present illness. He was evaluated in the context of the global COVID-19 pandemic, which necessitated consideration that the patient might be at risk for infection with the SARS-CoV-2 virus that causes COVID-19. Institutional protocols and algorithms that pertain to the evaluation of patients at risk for COVID-19 are in a state of rapid change based on information released by regulatory bodies including the CDC and federal and state organizations. These policies and algorithms were followed during the patient's care in the ED.       ____________________________________________   FINAL CLINICAL IMPRESSION(S) / ED DIAGNOSES  Final diagnoses:  Ingrown toenail of left foot     ED Discharge Orders         Ordered    sulfamethoxazole-trimethoprim (BACTRIM DS) 800-160 MG tablet  2 times daily     04/02/19 1136    traMADol (ULTRAM) 50 MG tablet  Every 12 hours PRN     04/02/19 1136           Note:  This document was prepared using Dragon voice recognition software and may include unintentional dictation errors.    Joni ReiningSmith,  K, PA-C 04/02/19 1147  Earleen Newport, MD 04/02/19 1200

## 2019-04-02 NOTE — Telephone Encounter (Signed)
Patient indicated that he had filed taxes for 2019.  Provided [employer] Summary for 2019 from spouse, [name].  Still need copies of actual tax return or transcript for both patient and spouse.  Assisted patient and spouse with contacting IRS to request a copy of 2019 transcript.  Also, patient to provide SSI from [place], step-son who is living in the home and being supported by patient and spouse.  Patient understands that [facility] needs the requested documentation in order to provide medication assistance.  [facility]

## 2019-04-08 ENCOUNTER — Telehealth: Payer: Self-pay

## 2019-04-08 NOTE — Telephone Encounter (Signed)
Tried calling patient twice, line continues to be busy. Patient needs lab appt and 1 week followup after lab appt. Should be done middle of August.

## 2019-04-15 ENCOUNTER — Other Ambulatory Visit: Payer: Self-pay

## 2019-04-15 MED ORDER — DILTIAZEM HCL ER COATED BEADS 120 MG PO CP24: 120.0000 mg | ORAL_CAPSULE | Freq: Every day | ORAL | 0 refills | Status: DC

## 2019-04-21 ENCOUNTER — Ambulatory Visit: Payer: Self-pay | Admitting: Gerontology

## 2019-04-23 ENCOUNTER — Encounter: Payer: Self-pay | Admitting: Gerontology

## 2019-04-23 ENCOUNTER — Ambulatory Visit: Payer: Self-pay

## 2019-04-23 ENCOUNTER — Ambulatory Visit: Payer: Self-pay | Admitting: Gerontology

## 2019-04-23 ENCOUNTER — Other Ambulatory Visit: Payer: Self-pay

## 2019-04-23 VITALS — BP 106/73 | HR 72 | Temp 97.0°F | Ht 67.0 in | Wt 273.0 lb

## 2019-04-23 DIAGNOSIS — R062 Wheezing: Secondary | ICD-10-CM

## 2019-04-23 DIAGNOSIS — L600 Ingrowing nail: Secondary | ICD-10-CM

## 2019-04-23 DIAGNOSIS — I82502 Chronic embolism and thrombosis of unspecified deep veins of left lower extremity: Secondary | ICD-10-CM

## 2019-04-23 DIAGNOSIS — R7303 Prediabetes: Secondary | ICD-10-CM

## 2019-04-23 DIAGNOSIS — I4891 Unspecified atrial fibrillation: Secondary | ICD-10-CM

## 2019-04-23 NOTE — Progress Notes (Signed)
Established Patient Office Visit  Subjective:  Patient ID: Matthew [name], male    DOB: 09/30/1970  Age: 48 y.o. MRN: [id number]  CC:  Chief Complaint  Patient presents with   Atrial Fibrillation    HPI Matthew CrankerKelly [name] presents for follow up of Afib, chronic DVT, c/o ingrown toe nail to right great toe and lab review. He reports that he's compliant with his medications. He states that he continues to experience 10/10 sharp pain to right great toe nail when it's touched. He states that it started 2 weeks ago. He denies erythema, edema and discharge from site. His HgbA1c done 7 months ago was 5.9 % and he states that he is trying to comply with low carb diet and exercises as tolerated. He continues to smoke 1 pack of cigarette weekly and denies the desire to quit.He denies hematuria, hematochezia, dyspnea, chest pain, palpitation, claudication, but endorses mild edema to left lower leg which resolves with elevation. He reports that he is doing well and offers no further complaint.  Past Medical History:  Diagnosis Date   Hx of blood clots    Sleep apnea     Past Surgical History:  Procedure Laterality Date   IVC FILTER INSERTION Left 11/24/2018   Procedure: IVC FILTER INSERTION;  Surgeon: Annice Needyew, Jason S, MD;  Location: [facility];  Service: Cardiovascular;  Laterality: Left;   IVC FILTER INSERTION Right 02/18/2019   Procedure: IVC FILTER INSERTION WITH RIGHT LOWER EXTREMITY VENOUS LYSIS;  Surgeon: Renford DillsSchnier, Gregory G, MD;  Location: [facility];  Service: Cardiovascular;  Laterality: Right;   IVC FILTER REMOVAL N/A 12/01/2018   Procedure: IVC FILTER REMOVAL;  Surgeon: Annice Needyew, Jason S, MD;  Location: [facility];  Service: Cardiovascular;  Laterality: N/A;   NO PAST SURGERIES      No family history on file.  Social History   Socioeconomic History   Marital status: Married    Spouse name: Matthew [name]   Number of children: 4   Years of education: Not on  file   Highest education level: Not on file  Occupational History   Not on file  Social Needs   Financial resource strain: Not hard at all   Food insecurity    Worry: Never true    Inability: Never true   Transportation needs    Medical: No    Non-medical: No  Tobacco Use   Smoking status: Current Every Day Smoker    Packs/day: 0.50    Types: Cigarettes   Smokeless tobacco: Never Used  Substance and Sexual Activity   Alcohol use: Yes    Comment: occassional   Drug use: Never   Sexual activity: Yes  Lifestyle   Physical activity    Days per week: Not on file    Minutes per session: Not on file   Stress: Only a little  Relationships   Social connections    Talks on phone: More than three times a week    Gets together: Not on file    Attends religious service: Not on file    Active member of club or organization: Not on file    Attends meetings of clubs or organizations: Not on file    Relationship status: Not on file   Intimate partner violence    Fear of current or ex partner: No    Emotionally abused: No    Physically abused: No    Forced sexual activity: No  Other Topics Concern  Not on file  Social History Narrative   Not on file    Outpatient Medications Prior to Visit  Medication Sig Dispense Refill   diltiazem (CARDIZEM CD) 120 MG 24 hr capsule Take 1 capsule (120 mg total) by mouth daily for 7 days. 7 capsule 0   rivaroxaban (XARELTO) 20 MG TABS tablet Take 20 mg by mouth daily with supper.     albuterol (ACCUNEB) 1.25 MG/3ML nebulizer solution Take 1 ampule by nebulization every 6 (six) hours as needed for wheezing.     sulfamethoxazole-trimethoprim (BACTRIM DS) 800-160 MG tablet Take 1 tablet by mouth 2 (two) times daily. 20 tablet 0   triamcinolone cream (KENALOG) 0.1 % Apply 1 application topically 2 (two) times daily. (Patient not taking: Reported on 03/19/2019) 30 g 0   No facility-administered medications prior to visit.      No Known Allergies  ROS Review of Systems  Constitutional: Negative.   HENT: Negative.   Respiratory: Negative.   Cardiovascular: Positive for leg swelling (swelling to left lower leg).  Gastrointestinal: Negative.   Genitourinary: Negative.   Musculoskeletal: Negative.   Skin: Negative.   Neurological: Negative.   Psychiatric/Behavioral: Negative.       Objective:    Physical Exam  Constitutional: He is oriented to person, place, and time. He appears well-developed and well-nourished.  obese  HENT:  Head: Normocephalic and atraumatic.  Eyes: Pupils are equal, round, and reactive to light. EOM are normal.  Cardiovascular: Normal rate and regular rhythm.  Pulmonary/Chest: He has wheezes (expiratory wheezes to lower lobes).  Abdominal: Soft. Bowel sounds are normal.  Central obesity  Musculoskeletal:        General: Edema (+1 edema to left lower leg) present.  Neurological: He is alert and oriented to person, place, and time.  Skin: Skin is warm and dry. No cyanosis. Nails show no clubbing.     Psychiatric: He has a normal mood and affect. His behavior is normal. Judgment and thought content normal.    BP 106/73    Pulse 72    Temp (!) 97 F (36.1 C)    Ht 5\' 7"  (1.702 m)    Wt 273 lb (123.8 kg)    BMI 42.76 kg/m  Wt Readings from Last 3 Encounters:  04/23/19 273 lb (123.8 kg)  04/02/19 280 lb (127 kg)  03/11/19 280 lb 6.4 oz (127.2 kg)   He was encouraged to continue on weight loss regimen.  Health Maintenance Due  Topic Date Due   TETANUS/TDAP  07/09/1989   INFLUENZA VACCINE  04/04/2019    There are no preventive care reminders to display for this patient.  Lab Results  Component Value Date   TSH 1.636 03/12/2019   Lab Results  Component Value Date   WBC 9.7 03/13/2019   HGB 17.5 (H) 03/13/2019   HCT 55.7 (H) 03/13/2019   MCV 102.2 (H) 03/13/2019   PLT 164 03/13/2019   Lab Results  Component Value Date   NA 137 03/13/2019   K 4.2  03/13/2019   CO2 35 (H) 03/13/2019   GLUCOSE 128 (H) 03/13/2019   BUN 8 03/13/2019   CREATININE 0.55 (L) 03/13/2019   BILITOT 0.5 03/10/2019   ALKPHOS 123 03/10/2019   AST 18 03/10/2019   ALT 14 03/10/2019   PROT 7.4 03/10/2019   ALBUMIN 3.0 (L) 03/10/2019   CALCIUM 8.5 (L) 03/13/2019   ANIONGAP 7 03/13/2019   Lab Results  Component Value Date   CHOL 155 09/10/2018  Lab Results  Component Value Date   HDL 38 (L) 09/10/2018   Lab Results  Component Value Date   LDLCALC 84 09/10/2018   Lab Results  Component Value Date   TRIG 163 (H) 09/10/2018   Lab Results  Component Value Date   CHOLHDL 4.1 09/10/2018   Lab Results  Component Value Date   HGBA1C 5.9 (H) 09/10/2018      Assessment & Plan:     1. Atrial fibrillation, unspecified type [facility]) - He will continue on current treatment regimen and was advised to notify clinic with hematuria or go to the ED with active bleeding.  2. Ingrown toenail of right foot - He was advised to take Tylenol 650 mg every 8 hours as needed. He was encouraged to complete charity care application for - Ambulatory referral to Podiatry  3. Chronic deep vein thrombosis (DVT) of left lower extremity, unspecified vein (HCC) - He will continue on current treatment regimen and to notify clinic for worsening edema or claudication to left lower extremity. -Elevate your legs up above heart level while resting - Wear compression stockings if standing or walking for a while -Reduce sodium intake and limit fluid intake -Exercise daily as tolerated   4. Prediabetes - He defers treatment and states that he will continue on low carb/ non concentrated sweet diet and exercise as tolerated.  5. Wheezing on auscultation - He was advised to use nebulizer treatment as ordered. He was advised to notify clinic for worsening symptoms.   Follow-up: Return in about 3 months (around 07/24/2019), or if symptoms worsen or fail to improve.    Makaria Poarch Jerold Coombe, NP

## 2019-04-25 DIAGNOSIS — R7303 Prediabetes: Secondary | ICD-10-CM | POA: Insufficient documentation

## 2019-04-25 DIAGNOSIS — R062 Wheezing: Secondary | ICD-10-CM | POA: Insufficient documentation

## 2019-04-29 ENCOUNTER — Other Ambulatory Visit: Payer: Self-pay

## 2019-04-29 MED ORDER — DILTIAZEM HCL ER COATED BEADS 120 MG PO CP24: 120.0000 mg | ORAL_CAPSULE | Freq: Every day | ORAL | 1 refills | Status: DC

## 2019-06-03 ENCOUNTER — Encounter: Payer: Self-pay | Admitting: Emergency Medicine

## 2019-06-03 ENCOUNTER — Emergency Department: Payer: Self-pay

## 2019-06-03 ENCOUNTER — Other Ambulatory Visit: Payer: Self-pay

## 2019-06-03 ENCOUNTER — Emergency Department
Admission: EM | Admit: 2019-06-03 | Discharge: 2019-06-03 | Disposition: A | Discharge status: Home / Self Care | Payer: Self-pay | Attending: Emergency Medicine | Admitting: Emergency Medicine

## 2019-06-03 DIAGNOSIS — R5383 Other fatigue: Secondary | ICD-10-CM | POA: Insufficient documentation

## 2019-06-03 DIAGNOSIS — F17210 Nicotine dependence, cigarettes, uncomplicated: Secondary | ICD-10-CM | POA: Insufficient documentation

## 2019-06-03 DIAGNOSIS — Z7901 Long term (current) use of anticoagulants: Secondary | ICD-10-CM | POA: Insufficient documentation

## 2019-06-03 DIAGNOSIS — Z79899 Other long term (current) drug therapy: Secondary | ICD-10-CM | POA: Insufficient documentation

## 2019-06-03 DIAGNOSIS — M25531 Pain in right wrist: Secondary | ICD-10-CM | POA: Insufficient documentation

## 2019-06-03 LAB — URINALYSIS, COMPLETE (UACMP) WITH MICROSCOPIC
Bacteria, UA: NONE SEEN
Bilirubin Urine: NEGATIVE
Glucose, UA: NEGATIVE mg/dL
Hgb urine dipstick: NEGATIVE
Ketones, ur: NEGATIVE mg/dL
Leukocytes,Ua: NEGATIVE
Nitrite: NEGATIVE
Protein, ur: NEGATIVE mg/dL
Specific Gravity, Urine: 1.005 (ref 1.005–1.030)
WBC, UA: NONE SEEN WBC/hpf (ref 0–5)
pH: 6 (ref 5.0–8.0)

## 2019-06-03 LAB — CBC
HCT: 51.8 % (ref 39.0–52.0)
Hemoglobin: 17.4 g/dL — ABNORMAL HIGH (ref 13.0–17.0)
MCH: 33.3 pg (ref 26.0–34.0)
MCHC: 33.6 g/dL (ref 30.0–36.0)
MCV: 99 fL (ref 80.0–100.0)
Platelets: 172 10*3/uL (ref 150–400)
RBC: 5.23 MIL/uL (ref 4.22–5.81)
RDW: 16 % — ABNORMAL HIGH (ref 11.5–15.5)
WBC: 10.4 10*3/uL (ref 4.0–10.5)
nRBC: 0 % (ref 0.0–0.2)

## 2019-06-03 LAB — BASIC METABOLIC PANEL
Anion gap: 10 (ref 5–15)
BUN: 10 mg/dL (ref 6–20)
CO2: 27 mmol/L (ref 22–32)
Calcium: 9.1 mg/dL (ref 8.9–10.3)
Chloride: 101 mmol/L (ref 98–111)
Creatinine, Ser: 0.75 mg/dL (ref 0.61–1.24)
GFR calc Af Amer: >60 mL/min (ref >60)
GFR calc non Af Amer: >60 mL/min (ref >60)
Glucose, Bld: 105 mg/dL — ABNORMAL HIGH (ref 70–99)
Potassium: 4.6 mmol/L (ref 3.5–5.1)
Sodium: 138 mmol/L (ref 135–145)

## 2019-06-03 NOTE — ED Notes (Signed)
Reports feeling weak and tired, fell out of his bed and hurt his right wrist.

## 2019-06-03 NOTE — ED Triage Notes (Signed)
Patient states he fell off the bed 2 days ago and hurt his right wrist. Complaining of continued pain. Patient also reports feeling more tired than normal. States "I don't know if I'm getting too much sleep or not enough".

## 2019-06-03 NOTE — ED Provider Notes (Addendum)
[facility] Emergency Department Provider Note   ____________________________________________   First MD Initiated Contact with Patient 06/03/19 1528     (approximate)  I have reviewed the triage vital signs and the nursing notes.   HISTORY  Chief Complaint Wrist Pain and Fatigue    HPI Matthew [name] is a 48 y.o. male who reports he is feeling very sleepy because he has been working 11 days out of the last 12.  Additionally he reports he fell out of bed 2 nights ago and landed on his wrist his wrist is still hurting.  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  Dr. Harlow Mares reminded me that Dr. Peggye Ley is the hand surgeon here.  I will have this patient follow-up with Dr. Peggye Ley.  The pain is worse with palpation or movement moderate.         Past Medical History:  Diagnosis Date  . Hx of blood clots   . Sleep apnea     Patient Active Problem List   Diagnosis Date Noted  . Prediabetes 04/25/2019  . Wheezing on auscultation 04/25/2019  . Ingrown toenail of right foot 04/23/2019  . Atrial fibrillation ([place]) 03/19/2019  . DVT (deep venous thrombosis) ([place]) 03/19/2019  . Bilateral pneumonia 03/11/2019  . Acute DVT (deep venous thrombosis) ([place]) 02/17/2019  . DVT of lower extremity (deep venous thrombosis) ([place]) 11/19/2018    Past Surgical History:  Procedure Laterality Date  . IVC FILTER INSERTION Left 11/24/2018   Procedure: IVC FILTER INSERTION;  Surgeon: Algernon Huxley, MD;  Location: [facility];  Service: Cardiovascular;  Laterality: Left;  . IVC FILTER INSERTION Right 02/18/2019   Procedure: IVC FILTER INSERTION WITH RIGHT LOWER EXTREMITY VENOUS LYSIS;  Surgeon: Katha Cabal, MD;  Location: [facility];  Service: Cardiovascular;  Laterality: Right;  . IVC FILTER REMOVAL N/A 12/01/2018   Procedure: IVC FILTER REMOVAL;  Surgeon: Algernon Huxley, MD;  Location:  [facility];  Service: Cardiovascular;  Laterality: N/A;  . NO PAST SURGERIES      Prior to Admission medications   Medication Sig Start Date End Date Taking? Authorizing Provider  albuterol (ACCUNEB) 1.25 MG/3ML nebulizer solution Take 1 ampule by nebulization every 6 (six) hours as needed for wheezing.    [provider]  diltiazem (CARDIZEM CD) 120 MG 24 hr capsule Take 1 capsule (120 mg total) by mouth daily. 04/29/19 05/29/19  Iloabachie, Chioma E, NP  rivaroxaban (XARELTO) 20 MG TABS tablet Take 20 mg by mouth daily with supper.    [provider]    Allergies Patient has no known allergies.  No family history on file.  Social History Social History   Tobacco Use  . Smoking status: Current Every Day Smoker    Packs/day: 0.50    Types: Cigarettes  . Smokeless tobacco: Never Used  Substance Use Topics  . Alcohol use: Yes    Comment: occassional  . Drug use: Never    Review of Systems  Constitutional: No fever/chills Eyes: No visual changes. ENT: No sore throat. Cardiovascular: Denies chest pain. Respiratory: Denies shortness of breath. Gastrointestinal: No abdominal pain.  No nausea, no vomiting.  No diarrhea.  No constipation. Genitourinary: Negative for dysuria. Musculoskeletal: Negative for back pain. Skin: Negative for rash. Neurological: Negative for headaches, focal weakness   ____________________________________________   PHYSICAL EXAM:  VITAL SIGNS: ED Triage Vitals  Enc Vitals Group  BP 06/03/19 1411 131/77     Pulse Rate 06/03/19 1411 82     Resp 06/03/19 1411 16     Temp 06/03/19 1411 97.7 F (36.5 C)     Temp Source 06/03/19 1411 Oral     SpO2 06/03/19 1411 92 %     Weight 06/03/19 1412 270 lb (122.5 kg)     Height 06/03/19 1412 5\' 7"  (1.702 m)     Head Circumference --      Peak Flow --      Pain Score 06/03/19 1411 5     Pain Loc --      Pain Edu? --      Excl. in GC? --     Constitutional: Alert and  oriented. Well appearing and in no acute distress. Eyes: Conjunctivae are normal.  Head: Atraumatic. Nose: No congestion/rhinnorhea. Mouth/Throat: Mucous membranes are moist.  Neck: No stridor.  Cardiovascular: Normal rate, regular rhythm. Grossly normal heart sounds.  Good peripheral circulation. Respiratory: Normal respiratory effort.  No retractions. Lungs CTAB. Gastrointestinal: Soft and nontender. No distention. No abdominal bruits. No CVA tenderness. Musculoskeletal: No lower extremity tenderness nor edema.  Patient has pain on palpation of the mid wrist just distal to the radius.  There is no pain elsewhere. Neurologic:  Normal speech and language. No gross focal neurologic deficits are appreciated.  Skin:  Skin is warm, dry and intact. No rash noted.   ____________________________________________   LABS (all labs ordered are listed, but only abnormal results are displayed)  Labs Reviewed  BASIC METABOLIC PANEL - Abnormal; Notable for the following components:      Result Value   Glucose, Bld 105 (*)    All other components within normal limits  CBC - Abnormal; Notable for the following components:   Hemoglobin 17.4 (*)    RDW 16.0 (*)    All other components within normal limits  URINALYSIS, COMPLETE (UACMP) WITH MICROSCOPIC - Abnormal; Notable for the following components:   Color, Urine STRAW (*)    APPearance CLEAR (*)    All other components within normal limits   ____________________________________________  EKG  EKG read interpreted by me shows normal sinus rhythm at 81 extreme right axis right bundle branch block decreased R wave progression. ____________________________________________  RADIOLOGY  ED MD interpretation: X-ray read by radiologist reviewed by me.  There is some chondrocalcinosis and focal sclerosis of the lunate.  Official radiology report(s): Dg Wrist Complete Right  Result Date: 06/03/2019 CLINICAL DATA:  Fall, pain EXAM: RIGHT WRIST -  COMPLETE 3+ VIEW COMPARISON:  None. FINDINGS: No fracture or dislocation of the right wrist. The carpus is normally aligned. There is focal sclerosis of the lunate. Mild chondrocalcinosis without significant arthrosis. Soft tissues are unremarkable. IMPRESSION: 1.  No fracture or dislocation of the right wrist. 2. There is focal sclerosis of the lunate, suggesting a vascular necrosis (Kienbock's disease). MRI may be used to further assess. 3.  Chondrocalcinosis without significant arthrosis. Electronically Signed   By: 06/05/2019 M.D.   On: 06/03/2019 15:01    ____________________________________________   PROCEDURES  Procedure(s) performed (including Critical Care):  Procedures   ____________________________________________   INITIAL IMPRESSION / ASSESSMENT AND PLAN / ED COURSE  Matthew [name] was evaluated in Emergency Department on 06/03/2019 for the symptoms described in the history of present illness. He was evaluated in the context of the global COVID-19 pandemic, which necessitated consideration that the patient might be at risk for infection with the SARS-CoV-2 virus that  causes COVID-19. Institutional protocols and algorithms that pertain to the evaluation of patients at risk for COVID-19 are in a state of rapid change based on information released by regulatory bodies including the CDC and federal and state organizations. These policies and algorithms were followed during the patient's care in the ED.     Dr. Nechama GuardBauer suggest Velcro splint of the wrist and follow-up with hand surgery.  We will do this.         ____________________________________________   FINAL CLINICAL IMPRESSION(S) / ED DIAGNOSES  Final diagnoses:  Right wrist pain     ED Discharge Orders    None       Note:  This document was prepared using Dragon voice recognition software and may include unintentional dictation errors.    Arnaldo NatalMalinda, Fredy Gladu F, MD 06/03/19 1624    Arnaldo NatalMalinda, Syd Manges F, MD  06/11/19 1415

## 2019-06-03 NOTE — Discharge Instructions (Addendum)
Please wear your splint.  Use Tylenol for pain for the time being.  Follow-up with Dr. Peggye Ley the hand surgeon tomorrow.  You can give them a call early in the morning but they will be there tomorrow and Dr. Harlow Mares who is on-call today said it should be good for you to go in tomorrow.  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  If this is correct it will get worse and worse in lead to badly impaired use of the wrist if it is not addressed properly

## 2019-06-18 ENCOUNTER — Other Ambulatory Visit: Payer: Self-pay | Admitting: Gerontology

## 2019-06-18 ENCOUNTER — Other Ambulatory Visit: Payer: Self-pay

## 2019-06-18 DIAGNOSIS — R7303 Prediabetes: Secondary | ICD-10-CM

## 2019-06-27 ENCOUNTER — Encounter: Payer: Self-pay | Admitting: Emergency Medicine

## 2019-06-27 ENCOUNTER — Emergency Department
Admission: EM | Admit: 2019-06-27 | Discharge: 2019-06-27 | Disposition: A | Discharge status: Home / Self Care | Payer: Self-pay | Attending: Emergency Medicine | Admitting: Emergency Medicine

## 2019-06-27 ENCOUNTER — Other Ambulatory Visit: Payer: Self-pay

## 2019-06-27 DIAGNOSIS — F17210 Nicotine dependence, cigarettes, uncomplicated: Secondary | ICD-10-CM | POA: Insufficient documentation

## 2019-06-27 DIAGNOSIS — X500XXA Overexertion from strenuous movement or load, initial encounter: Secondary | ICD-10-CM | POA: Insufficient documentation

## 2019-06-27 DIAGNOSIS — Y9389 Activity, other specified: Secondary | ICD-10-CM | POA: Insufficient documentation

## 2019-06-27 DIAGNOSIS — Y999 Unspecified external cause status: Secondary | ICD-10-CM | POA: Insufficient documentation

## 2019-06-27 DIAGNOSIS — S39012A Strain of muscle, fascia and tendon of lower back, initial encounter: Secondary | ICD-10-CM | POA: Insufficient documentation

## 2019-06-27 DIAGNOSIS — Y929 Unspecified place or not applicable: Secondary | ICD-10-CM | POA: Insufficient documentation

## 2019-06-27 DIAGNOSIS — Z7901 Long term (current) use of anticoagulants: Secondary | ICD-10-CM | POA: Insufficient documentation

## 2019-06-27 DIAGNOSIS — Z79899 Other long term (current) drug therapy: Secondary | ICD-10-CM | POA: Insufficient documentation

## 2019-06-27 MED ORDER — CYCLOBENZAPRINE HCL 5 MG PO TABS: ORAL_TABLET | ORAL | 0 refills | Status: DC

## 2019-06-27 NOTE — ED Provider Notes (Signed)
[facility] Emergency Department Provider Note ____________________________________________  Time seen: 1220  I have reviewed the triage vital signs and the nursing notes.  HISTORY  Chief Complaint  Back Pain  HPI Matthew [name] is a 48 y.o. male presents himself to the ED for evaluation of  lower mid back pain since yesterday.  Patient describes working yesterday and doing a lot of heavy lifting at work which preceded his complaints.  He denies any radiation of the pain into the legs or buttocks.  Denies any bladder or bowel incontinence, foot drop, or saddle anesthesia.  He describes pain is worse with movement most notably, bending over.  Patient has a history of DVT and atrial fibrillation, he takes Xarelto for anticoagulation and diltiazem for heart disease.  Past Medical History:  Diagnosis Date  . Hx of blood clots   . Sleep apnea     Patient Active Problem List   Diagnosis Date Noted  . Prediabetes 04/25/2019  . Wheezing on auscultation 04/25/2019  . Ingrown toenail of right foot 04/23/2019  . Atrial fibrillation ([place]) 03/19/2019  . DVT (deep venous thrombosis) ([place]) 03/19/2019  . Bilateral pneumonia 03/11/2019  . Acute DVT (deep venous thrombosis) ([place]) 02/17/2019  . DVT of lower extremity (deep venous thrombosis) ([place]) 11/19/2018    Past Surgical History:  Procedure Laterality Date  . IVC FILTER INSERTION Left 11/24/2018   Procedure: IVC FILTER INSERTION;  Surgeon: Algernon Huxley, MD;  Location: [facility];  Service: Cardiovascular;  Laterality: Left;  . IVC FILTER INSERTION Right 02/18/2019   Procedure: IVC FILTER INSERTION WITH RIGHT LOWER EXTREMITY VENOUS LYSIS;  Surgeon: Katha Cabal, MD;  Location: [facility];  Service: Cardiovascular;  Laterality: Right;  . IVC FILTER REMOVAL N/A 12/01/2018   Procedure: IVC FILTER REMOVAL;  Surgeon: Algernon Huxley, MD;  Location: [facility];  Service: Cardiovascular;   Laterality: N/A;  . NO PAST SURGERIES      Prior to Admission medications   Medication Sig Start Date End Date Taking? Authorizing Provider  albuterol (ACCUNEB) 1.25 MG/3ML nebulizer solution Take 1 ampule by nebulization every 6 (six) hours as needed for wheezing.    [provider]  cyclobenzaprine (FLEXERIL) 5 MG tablet 1-2 tabs TID prn spasms 06/27/19   Oluwadamilola Deliz, Dannielle Karvonen, PA-C  diltiazem (CARDIZEM CD) 120 MG 24 hr capsule Take 1 capsule (120 mg total) by mouth daily. 04/29/19 05/29/19  Iloabachie, Chioma E, NP  rivaroxaban (XARELTO) 20 MG TABS tablet Take 20 mg by mouth daily with supper.    [provider]    Allergies Patient has no known allergies.  No family history on file.  Social History Social History   Tobacco Use  . Smoking status: Current Every Day Smoker    Packs/day: 0.50    Types: Cigarettes  . Smokeless tobacco: Never Used  Substance Use Topics  . Alcohol use: Yes    Comment: occassional  . Drug use: Never    Review of Systems  Constitutional: Negative for fever. Cardiovascular: Negative for chest pain. Respiratory: Negative for shortness of breath. Gastrointestinal: Negative for abdominal pain, vomiting and diarrhea. Genitourinary: Negative for dysuria. Musculoskeletal: Positive for back pain. Skin: Negative for rash. Neurological: Negative for headaches, focal weakness or numbness. ____________________________________________  PHYSICAL EXAM:  VITAL SIGNS: ED Triage Vitals  Enc Vitals Group     BP 06/27/19 1119 115/70     Pulse Rate 06/27/19 1119 81     Resp  06/27/19 1119 18     Temp 06/27/19 1119 98.5 F (36.9 C)     Temp Source 06/27/19 1119 Oral     SpO2 06/27/19 1119 96 %     Weight 06/27/19 1120 300 lb (136.1 kg)     Height 06/27/19 1120 5\' 7"  (1.702 m)     Head Circumference --      Peak Flow --      Pain Score 06/27/19 1120 8     Pain Loc --      Pain Edu? --      Excl. in GC? --     Constitutional:  Alert and oriented. Well appearing and in no distress. Head: Normocephalic and atraumatic. Eyes: Conjunctivae are normal. Normal extraocular movements Cardiovascular: Normal rate, regular rhythm. Normal distal pulses. Respiratory: Normal respiratory effort. No wheezes/rales/rhonchi. Gastrointestinal: Soft and nontender. No distention. Musculoskeletal: Normal spinal alignment without midline tenderness spasm, deformity, or step-off.  Patient transitions from supine to sit without assistance.  Normal lumbar flexion and extension range on exam.  Normal hip flexion is noted.  Nontender with normal range of motion in all extremities.  Neurologic: Cranial nerves II through XII grossly intact.  Normal LE DTRs bilaterally.  Negative Trendelenburg.  Normal toe and heel raise on exam.  Normal gait without ataxia. Normal speech and language. No gross focal neurologic deficits are appreciated. Skin:  Skin is warm, dry and intact. No rash noted. Psychiatric: Mood and affect are normal. Patient exhibits appropriate insight and judgment. ____________________________________________  PROCEDURES  Procedures ____________________________________________  INITIAL IMPRESSION / ASSESSMENT AND PLAN / ED COURSE  Patient with ED evaluation of acute lumbar muscle strain.  Clinical picture is reassuring as he has had no neuromuscular deficits or trauma to the knee concerning for an acute herniated disc, spinal fracture or dislocation.  Patient will be discharged with a prescription for cyclobenzaprine to take as needed but he declined steroids at this time.  He will follow with primary provider return to the ED as needed.  Work note is provided for 1 day as requested.  Matthew [name] was evaluated in Emergency Department on 06/28/2019 for the symptoms described in the history of present illness. He was evaluated in the context of the global COVID-19 pandemic, which necessitated consideration that the patient might be at  risk for infection with the SARS-CoV-2 virus that causes COVID-19. Institutional protocols and algorithms that pertain to the evaluation of patients at risk for COVID-19 are in a state of rapid change based on information released by regulatory bodies including the CDC and federal and state organizations. These policies and algorithms were followed during the patient's care in the ED. ____________________________________________  FINAL CLINICAL IMPRESSION(S) / ED DIAGNOSES  Final diagnoses:  Strain of lumbar region, initial encounter      06/30/2019, PA-C 06/28/19 1851    06/30/19, MD 06/29/19 1414

## 2019-06-27 NOTE — Discharge Instructions (Signed)
You are being treated for a back muscle strain. Take the muscle relaxant as directed. Apply ice and/or moist heat to reduce symptoms. Follow-up with your provider for ongoing pain.

## 2019-06-27 NOTE — ED Notes (Signed)
Pt c/o lower back pain since yesterday after putting up trays in the freezer. Pt states today was a lot more difficult to walk and more pain in his lower back.

## 2019-06-27 NOTE — ED Triage Notes (Addendum)
Lower mid back pain since yesterday. States was doing a lot of heavy lifting at work yesterday. States does not radiate to either buttock or leg. States pain increases with movement, especially bending over.

## 2019-06-27 NOTE — ED Notes (Signed)
First Nurse Note: Pt ambulatory into ED via POV c/o back pain. Pt is in NAD

## 2019-07-16 ENCOUNTER — Encounter: Payer: Self-pay | Admitting: Emergency Medicine

## 2019-07-16 ENCOUNTER — Emergency Department
Admission: EM | Admit: 2019-07-16 | Discharge: 2019-07-16 | Disposition: A | Discharge status: Home / Self Care | Payer: Self-pay | Attending: Emergency Medicine | Admitting: Emergency Medicine

## 2019-07-16 ENCOUNTER — Emergency Department: Payer: Self-pay

## 2019-07-16 ENCOUNTER — Other Ambulatory Visit: Payer: Self-pay

## 2019-07-16 DIAGNOSIS — F17210 Nicotine dependence, cigarettes, uncomplicated: Secondary | ICD-10-CM | POA: Insufficient documentation

## 2019-07-16 DIAGNOSIS — Y929 Unspecified place or not applicable: Secondary | ICD-10-CM | POA: Insufficient documentation

## 2019-07-16 DIAGNOSIS — S39012A Strain of muscle, fascia and tendon of lower back, initial encounter: Secondary | ICD-10-CM | POA: Insufficient documentation

## 2019-07-16 DIAGNOSIS — X500XXA Overexertion from strenuous movement or load, initial encounter: Secondary | ICD-10-CM | POA: Insufficient documentation

## 2019-07-16 DIAGNOSIS — Y9389 Activity, other specified: Secondary | ICD-10-CM | POA: Insufficient documentation

## 2019-07-16 DIAGNOSIS — Z7901 Long term (current) use of anticoagulants: Secondary | ICD-10-CM | POA: Insufficient documentation

## 2019-07-16 DIAGNOSIS — Y999 Unspecified external cause status: Secondary | ICD-10-CM | POA: Insufficient documentation

## 2019-07-16 HISTORY — DX: Unspecified osteoarthritis, unspecified site: M19.90

## 2019-07-16 MED ORDER — ORPHENADRINE CITRATE ER 100 MG PO TB12: 100.0000 mg | ORAL_TABLET | Freq: Two times a day (BID) | ORAL | 0 refills | Status: DC

## 2019-07-16 MED ORDER — ORPHENADRINE CITRATE 30 MG/ML IJ SOLN
60.0000 mg | Freq: Two times a day (BID) | INTRAMUSCULAR | Status: DC
  Administered 2019-07-16: 09:00:00 60 mg via INTRAMUSCULAR
  Filled 2019-07-16: qty 2

## 2019-07-16 MED ORDER — KETOROLAC TROMETHAMINE 60 MG/2ML IM SOLN
60.0000 mg | Freq: Once | INTRAMUSCULAR | Status: AC
  Administered 2019-07-16: 09:00:00 60 mg via INTRAMUSCULAR
  Filled 2019-07-16: qty 2

## 2019-07-16 MED ORDER — OXYCODONE-ACETAMINOPHEN 7.5-325 MG PO TABS: 1.0000 | ORAL_TABLET | Freq: Four times a day (QID) | ORAL | 0 refills | Status: AC | PRN

## 2019-07-16 NOTE — ED Triage Notes (Signed)
Says he has low   back pain since Tuesday.  Says pain in back of left thigh aslo

## 2019-07-16 NOTE — Discharge Instructions (Signed)
For discharge care instructions medication as directed.

## 2019-07-16 NOTE — ED Notes (Signed)
See triage note  States he was seen about 3 weeks ago for back pain   Then 2 days ago he lifted a motorized cart   Felt pain to left lower back and into left leg  Ambulates well to treatment room

## 2019-07-16 NOTE — ED Provider Notes (Signed)
[facility] Emergency Department Provider Note   ____________________________________________   First MD Initiated Contact with Patient 07/16/19 [phone]     (approximate)  I have reviewed the triage vital signs and the nursing notes.   HISTORY  Chief Complaint Back Pain    HPI Krish Bailly is a 48 y.o. male patient presents with acute low back pain secondary to to heavy lifting incidents 2 days ago.  Patient states there is a radicular component to the left leg.  Patient denies bladder or bowel dysfunction.  Patient rates his pain as 8/10.  Patient described the pain as "achy/sharp".  No palliative measure complaint.         Past Medical History:  Diagnosis Date  . Arthritis   . Hx of blood clots   . Sleep apnea     Patient Active Problem List   Diagnosis Date Noted  . Prediabetes 04/25/2019  . Wheezing on auscultation 04/25/2019  . Ingrown toenail of right foot 04/23/2019  . Atrial fibrillation ([place]) 03/19/2019  . DVT (deep venous thrombosis) ([place]) 03/19/2019  . Bilateral pneumonia 03/11/2019  . Acute DVT (deep venous thrombosis) ([place]) 02/17/2019  . DVT of lower extremity (deep venous thrombosis) ([place]) 11/19/2018    Past Surgical History:  Procedure Laterality Date  . IVC FILTER INSERTION Left 11/24/2018   Procedure: IVC FILTER INSERTION;  Surgeon: Algernon Huxley, MD;  Location: [facility];  Service: Cardiovascular;  Laterality: Left;  . IVC FILTER INSERTION Right 02/18/2019   Procedure: IVC FILTER INSERTION WITH RIGHT LOWER EXTREMITY VENOUS LYSIS;  Surgeon: Katha Cabal, MD;  Location: [facility];  Service: Cardiovascular;  Laterality: Right;  . IVC FILTER REMOVAL N/A 12/01/2018   Procedure: IVC FILTER REMOVAL;  Surgeon: Algernon Huxley, MD;  Location: [facility];  Service: Cardiovascular;  Laterality: N/A;  . NO PAST SURGERIES      Prior to Admission medications   Medication Sig Start Date End Date  Taking? Authorizing Provider  albuterol (ACCUNEB) 1.25 MG/3ML nebulizer solution Take 1 ampule by nebulization every 6 (six) hours as needed for wheezing.    [provider]  diltiazem (CARDIZEM CD) 120 MG 24 hr capsule Take 1 capsule (120 mg total) by mouth daily. 04/29/19 05/29/19  Iloabachie, Chioma E, NP  orphenadrine (NORFLEX) 100 MG tablet Take 1 tablet (100 mg total) by mouth 2 (two) times daily. 07/16/19   Sable Feil, PA-C  oxyCODONE-acetaminophen (PERCOCET) 7.5-325 MG tablet Take 1 tablet by mouth every 6 (six) hours as needed for up to 3 days. 07/16/19 07/19/19  Sable Feil, PA-C  rivaroxaban (XARELTO) 20 MG TABS tablet Take 20 mg by mouth daily with supper.    [provider]    Allergies Patient has no known allergies.  No family history on file.  Social History Social History   Tobacco Use  . Smoking status: Current Every Day Smoker    Packs/day: 0.50    Types: Cigarettes  . Smokeless tobacco: Never Used  Substance Use Topics  . Alcohol use: Yes    Comment: occassional  . Drug use: Never    Review of Systems Constitutional: No fever/chills Eyes: No visual changes. ENT: No sore throat. Cardiovascular: Denies chest pain. Respiratory: Denies shortness of breath. Gastrointestinal: No abdominal pain.  No nausea, no vomiting.  No diarrhea.  No constipation. Genitourinary: Negative for dysuria. Musculoskeletal: Positive for back pain. Skin: Negative for rash. Neurological: Negative for headaches, focal weakness or  numbness.   ____________________________________________   PHYSICAL EXAM:  VITAL SIGNS: ED Triage Vitals  Enc Vitals Group     BP 07/16/19 0810 121/82     Pulse Rate 07/16/19 0810 62     Resp --      Temp 07/16/19 0810 98.8 F (37.1 C)     Temp Source 07/16/19 0810 Oral     SpO2 07/16/19 0810 94 %     Weight 07/16/19 0811 273 lb (123.8 kg)     Height 07/16/19 0811 5\' 7"  (1.702 m)     Head Circumference --      Peak  Flow --      Pain Score 07/16/19 0815 8     Pain Loc --      Pain Edu? --      Excl. in GC? --    Constitutional: Alert and oriented. Well appearing and in no acute distress.  Morbid obesity. Cardiovascular: Normal rate, regular rhythm. Grossly normal heart sounds.  Good peripheral circulation. Respiratory: Normal respiratory effort.  No retractions. Lungs CTAB. Gastrointestinal: Soft and nontender. No distention. No abdominal bruits. No CVA tenderness. Musculoskeletal: Body habitus limits the exam of the lumbar spine.  No obvious deformity.  Patient has moderate guarding palpation of L4-S1.  No lower extremity tenderness nor edema.  No joint effusions.  Patient negative straight leg test in the supine position. Neurologic:  Normal speech and language. No gross focal neurologic deficits are appreciated. No gait instability. Skin:  Skin is warm, dry and intact. No rash noted. Psychiatric: Mood and affect are normal. Speech and behavior are normal.  ____________________________________________   LABS (all labs ordered are listed, but only abnormal results are displayed)  Labs Reviewed - No data to display ____________________________________________  EKG   ____________________________________________  RADIOLOGY  ED MD interpretation:    Official radiology report(s): Dg Lumbar Spine 2-3 Views  Result Date: 07/16/2019 CLINICAL DATA:  Low back pain since a lifting injury 2 days ago. Initial encounter. EXAM: LUMBAR SPINE - 2-3 VIEW COMPARISON:  None. FINDINGS: There is no evidence of lumbar spine fracture. Alignment is normal. Intervertebral disc spaces are maintained in the lumbar spine. There is some loss of disc space height and endplate spurring at T9-10, T10-11 and T11-12. Aortic atherosclerosis and IVC filter noted. IMPRESSION: No acute abnormality.  Normal appearing lumbar spine. Lower thoracic spondylosis. Aortic atherosclerosis. Electronically Signed   By: Drusilla Kannerhomas  Dalessio M.D.    On: 07/16/2019 08:59    ____________________________________________   PROCEDURES  Procedure(s) performed (including Critical Care):  Procedures   ____________________________________________   INITIAL IMPRESSION / ASSESSMENT AND PLAN / ED COURSE  As part of my medical decision making, I reviewed the following data within the electronic MEDICAL RECORD NUMBER         Gildardo CrankerKelly Geeting was evaluated in Emergency Department on 07/16/2019 for the symptoms described in the history of present illness. He was evaluated in the context of the global COVID-19 pandemic, which necessitated consideration that the patient might be at risk for infection with the SARS-CoV-2 virus that causes COVID-19. Institutional protocols and algorithms that pertain to the evaluation of patients at risk for COVID-19 are in a state of rapid change based on information released by regulatory bodies including the CDC and federal and state organizations. These policies and algorithms were followed during the patient's care in the ED.  Patient presents with low back pain with the components of the left lower extremity secondary to to incident of heavy lifting.  Discussed x-ray findings with patient.  Patient physical exam consistent with lumbar strain.  Patient given discharge care instruction work note.  Patient advised drowsy effect of medications.  [facility]       ____________________________________________   FINAL CLINICAL IMPRESSION(S) / ED DIAGNOSES  Final diagnoses:  Strain of lumbar region, initial encounter     ED Discharge Orders         Ordered    oxyCODONE-acetaminophen (PERCOCET) 7.5-325 MG tablet  Every 6 hours PRN     07/16/19 0911    orphenadrine (NORFLEX) 100 MG tablet  2 times daily     07/16/19 0911           Note:  This document was prepared using Dragon voice recognition software and may include unintentional dictation errors.    Joni Reining, PA-C  07/16/19 2703    Chesley Noon, MD 07/16/19 [phone]

## 2019-07-22 ENCOUNTER — Ambulatory Visit: Payer: Self-pay | Admitting: Gerontology

## 2019-08-07 ENCOUNTER — Encounter: Payer: Self-pay | Admitting: Emergency Medicine

## 2019-08-07 ENCOUNTER — Other Ambulatory Visit: Payer: Self-pay

## 2019-08-07 ENCOUNTER — Emergency Department
Admission: EM | Admit: 2019-08-07 | Discharge: 2019-08-07 | Disposition: A | Discharge status: Home / Self Care | Payer: Self-pay | Attending: Emergency Medicine | Admitting: Emergency Medicine

## 2019-08-07 DIAGNOSIS — H6691 Otitis media, unspecified, right ear: Secondary | ICD-10-CM

## 2019-08-07 DIAGNOSIS — Z20828 Contact with and (suspected) exposure to other viral communicable diseases: Secondary | ICD-10-CM | POA: Insufficient documentation

## 2019-08-07 DIAGNOSIS — Z7901 Long term (current) use of anticoagulants: Secondary | ICD-10-CM | POA: Insufficient documentation

## 2019-08-07 DIAGNOSIS — Z01812 Encounter for preprocedural laboratory examination: Secondary | ICD-10-CM

## 2019-08-07 DIAGNOSIS — Z79899 Other long term (current) drug therapy: Secondary | ICD-10-CM | POA: Insufficient documentation

## 2019-08-07 MED ORDER — AZITHROMYCIN 250 MG PO TABS: ORAL_TABLET | ORAL | 0 refills | Status: DC

## 2019-08-07 MED ORDER — DOXYCYCLINE HYCLATE 50 MG PO CAPS: 100.0000 mg | ORAL_CAPSULE | Freq: Two times a day (BID) | ORAL | 0 refills | Status: AC

## 2019-08-07 NOTE — ED Notes (Signed)
Pt discharged from triage

## 2019-08-07 NOTE — ED Provider Notes (Signed)
[facility] Emergency Department Provider Note  ____________________________________________  Time seen: Approximately 4:54 PM  I have reviewed the triage vital signs and the nursing notes.   HISTORY  Chief Complaint Otalgia and Fatigue     HPI Matthew [name] is a 48 y.o. male that presents to the emergency department for evaluation of right ear pain for 2 days. He does note some post nasal drainage this afternoon that has made him cough. He went to bed really late and woke up tired this morning. He has been working extra hard and long hours, 6 days of week, which is exhausting. No fever, headache, SOB, CP, vomiting, abdominal pain, diarrhea.    Past Medical History:  Diagnosis Date  . Arthritis   . Hx of blood clots   . Sleep apnea     Patient Active Problem List   Diagnosis Date Noted  . Prediabetes 04/25/2019  . Wheezing on auscultation 04/25/2019  . Ingrown toenail of right foot 04/23/2019  . Atrial fibrillation ([place]) 03/19/2019  . DVT (deep venous thrombosis) ([place]) 03/19/2019  . Bilateral pneumonia 03/11/2019  . Acute DVT (deep venous thrombosis) ([place]) 02/17/2019  . DVT of lower extremity (deep venous thrombosis) ([place]) 11/19/2018    Past Surgical History:  Procedure Laterality Date  . IVC FILTER INSERTION Left 11/24/2018   Procedure: IVC FILTER INSERTION;  Surgeon: Algernon Huxley, MD;  Location: [place] [facility];  Service: Cardiovascular;  Laterality: Left;  . IVC FILTER INSERTION Right 02/18/2019   Procedure: IVC FILTER INSERTION WITH RIGHT LOWER EXTREMITY VENOUS LYSIS;  Surgeon: Katha Cabal, MD;  Location: [place] [facility];  Service: Cardiovascular;  Laterality: Right;  . IVC FILTER REMOVAL N/A 12/01/2018   Procedure: IVC FILTER REMOVAL;  Surgeon: Algernon Huxley, MD;  Location: [place] [facility];  Service: Cardiovascular;  Laterality: N/A;  . NO PAST SURGERIES      Prior to Admission medications   Medication Sig Start Date  End Date Taking? Authorizing Provider  albuterol (ACCUNEB) 1.25 MG/3ML nebulizer solution Take 1 ampule by nebulization every 6 (six) hours as needed for wheezing.    [provider]  diltiazem (CARDIZEM CD) 120 MG 24 hr capsule Take 1 capsule (120 mg total) by mouth daily. 04/29/19 05/29/19  Iloabachie, Chioma E, NP  doxycycline (VIBRAMYCIN) 50 MG capsule Take 2 capsules (100 mg total) by mouth 2 (two) times daily for 10 days. 08/07/19 08/17/19  Laban Emperor, PA-C  orphenadrine (NORFLEX) 100 MG tablet Take 1 tablet (100 mg total) by mouth 2 (two) times daily. 07/16/19   Sable Feil, PA-C  rivaroxaban (XARELTO) 20 MG TABS tablet Take 20 mg by mouth daily with supper.    [provider]    Allergies Patient has no known allergies.  No family history on file.  Social History Social History   Tobacco Use  . Smoking status: Current Every Day Smoker    Packs/day: 0.50    Types: Cigarettes  . Smokeless tobacco: Never Used  Substance Use Topics  . Alcohol use: Yes    Comment: occassional  . Drug use: Never     Review of Systems  Constitutional: No fever/chills Eyes: No visual changes. No discharge. ENT: Positive for congestion and rhinorrhea. Positive for right ear pain. Cardiovascular: No chest pain. Respiratory: Positive for cough secondary to post nasal drainage. No SOB. Gastrointestinal: No abdominal pain.  No nausea, no vomiting.  No diarrhea.  No constipation. Musculoskeletal: Negative for musculoskeletal pain. Skin: Negative  for rash, abrasions, lacerations, ecchymosis. Neurological: Negative for headaches.   ____________________________________________   PHYSICAL EXAM:  VITAL SIGNS: ED Triage Vitals  Enc Vitals Group     BP 08/07/19 1641 113/80     Pulse Rate 08/07/19 1641 99     Resp 08/07/19 1641 16     Temp 08/07/19 1641 98.6 F (37 C)     Temp Source 08/07/19 1641 Oral     SpO2 08/07/19 1641 94 %     Weight --      Height --      Head  Circumference --      Peak Flow --      Pain Score 08/07/19 1640 6     Pain Loc --      Pain Edu? --      Excl. in GC? --      Constitutional: Alert and oriented. Well appearing and in no acute distress. Eyes: Conjunctivae are normal. PERRL. EOMI. No discharge. Head: Atraumatic. ENT: No frontal and maxillary sinus tenderness.      Ears: Right tympanic membrane erythematous. Left tympanic membrane pearly. No discharge.      Nose: Mild congestion/rhinnorhea.      Mouth/Throat: Mucous membranes are moist. Oropharynx non-erythematous. Tonsils not enlarged. No exudates. Uvula midline. Neck: No stridor.   Hematological/Lymphatic/Immunilogical: No cervical lymphadenopathy. Cardiovascular: Normal rate, regular rhythm.  Good peripheral circulation. Respiratory: Normal respiratory effort without tachypnea or retractions. Lungs CTAB. Good air entry to the bases with no decreased or absent breath sounds. Gastrointestinal: Bowel sounds 4 quadrants. Soft and nontender to palpation. No guarding or rigidity. No palpable masses. No distention. Musculoskeletal: Full range of motion to all extremities. No gross deformities appreciated. Neurologic:  Normal speech and language. No gross focal neurologic deficits are appreciated.  Skin:  Skin is warm, dry and intact. No rash noted. Psychiatric: Mood and affect are normal. Speech and behavior are normal. Patient exhibits appropriate insight and judgement.   ____________________________________________   LABS (all labs ordered are listed, but only abnormal results are displayed)  Labs Reviewed  SARS CORONAVIRUS 2 (TAT 6-24 HRS)   ____________________________________________  EKG   ____________________________________________  RADIOLOGY   No results found.  ____________________________________________    PROCEDURES  Procedure(s) performed:    Procedures    Medications - No data to  display   ____________________________________________   INITIAL IMPRESSION / ASSESSMENT AND PLAN / ED COURSE  Pertinent labs & imaging results that were available during my care of the patient were reviewed by me and considered in my medical decision making (see chart for details).  Review of the [place] CSRS was performed in accordance of the [facility] prior to dispensing any controlled drugs.   Patient's diagnosis is consistent with otitis and covid screening. Vital signs and exam are reassuring. Covid test pending. Patient appears well and is staying well hydrated. Patient feels comfortable going home. Patient will be discharged home with prescriptions for doxycycline. Patient is to follow up with PCP as needed or otherwise directed. Patient is given ED precautions to return to the ED for any worsening or new symptoms.  Matthew [name] was evaluated in Emergency Department on 08/07/2019 for the symptoms described in the history of present illness. He was evaluated in the context of the global COVID-19 pandemic, which necessitated consideration that the patient might be at risk for infection with the SARS-CoV-2 virus that causes COVID-19. Institutional protocols and algorithms that pertain to the evaluation of patients at risk for COVID-19 are in a  state of rapid change based on information released by regulatory bodies including the CDC and federal and state organizations. These policies and algorithms were followed during the patient's care in the ED.   ____________________________________________  FINAL CLINICAL IMPRESSION(S) / ED DIAGNOSES  Final diagnoses:  Otitis of right ear  Encounter for preoperative screening laboratory testing for COVID-19 virus          This chart was dictated using voice recognition software/Dragon. Despite best efforts to proofread, errors can occur which can change the meaning. Any change was purely unintentional.    Enid DerryWagner, Sayre Mazor, PA-C 08/07/19 2222     Sharyn CreamerQuale, Mark, MD 08/07/19 2325

## 2019-08-07 NOTE — ED Triage Notes (Signed)
Pt to ED via POV c/o ear pain and fatigue. Pt states that he is having cough now. Pt denies fevers or chills. Pt is in NAD.

## 2019-08-08 LAB — SARS CORONAVIRUS 2 (TAT 6-24 HRS): SARS Coronavirus 2: NEGATIVE

## 2019-08-13 ENCOUNTER — Other Ambulatory Visit: Payer: Self-pay

## 2019-08-13 ENCOUNTER — Ambulatory Visit: Payer: Self-pay | Admitting: Urology

## 2019-08-13 VITALS — BP 144/91 | HR 83 | Ht 67.0 in | Wt 286.0 lb

## 2019-08-13 DIAGNOSIS — I4891 Unspecified atrial fibrillation: Secondary | ICD-10-CM

## 2019-08-13 DIAGNOSIS — R7303 Prediabetes: Secondary | ICD-10-CM

## 2019-08-13 NOTE — Progress Notes (Signed)
  Patient: Matthew [name] Male    DOB: [birth year]   48 y.o.   MRN: [id number] Visit Date: 08/13/2019  Today's Provider: Zara Council, PA-C   Chief Complaint  Patient presents with  . prediabetes   Subjective:    HPI   A.fib - no chest pain, no SOB, taking Xarelto, denies any bleeding  Ingrown toenail - took care of it himself  DVT - no complaints of swelling or calf pain  Prediabetes - not following low carb diet    No Known Allergies Previous Medications   ALBUTEROL (ACCUNEB) 1.25 MG/3ML NEBULIZER SOLUTION    Take 1 ampule by nebulization every 6 (six) hours as needed for wheezing.   DILTIAZEM (CARDIZEM CD) 120 MG 24 HR CAPSULE    Take 1 capsule (120 mg total) by mouth daily.   DOXYCYCLINE (VIBRAMYCIN) 50 MG CAPSULE    Take 2 capsules (100 mg total) by mouth 2 (two) times daily for 10 days.   ORPHENADRINE (NORFLEX) 100 MG TABLET    Take 1 tablet (100 mg total) by mouth 2 (two) times daily.   RIVAROXABAN (XARELTO) 20 MG TABS TABLET    Take 20 mg by mouth daily with supper.    Review of Systems  Social History   Tobacco Use  . Smoking status: Current Every Day Smoker    Packs/day: 0.50    Types: Cigarettes  . Smokeless tobacco: Never Used  Substance Use Topics  . Alcohol use: Yes    Comment: occassional   Objective:   BP (!) 144/91 (BP Location: Right Arm, Patient Position: Sitting)   Pulse 83   Ht 5\' 7"  (1.702 m)   Wt 286 lb (129.7 kg)   SpO2 91%   BMI 44.79 kg/m   Physical Exam Constitutional:  Well nourished. Alert and oriented, No acute distress. HEENT: [place] AT, moist mucus membranes.  Trachea midline, no masses. Cardiovascular: No clubbing, cyanosis, or edema. Respiratory: Normal respiratory effort, no increased work of breathing. Neurologic: Grossly intact, no focal deficits, moving all 4 extremities. Psychiatric: Normal mood and affect.      Assessment & Plan:   1. A. Fib Continue Xarelto    2. Prediabetic Refuses follow up labs  3.  DVT Continue Xarelto        Zara Council, PA-C   [facility]

## 2019-08-27 ENCOUNTER — Ambulatory Visit: Payer: HRSA Program | Attending: Internal Medicine

## 2019-08-27 DIAGNOSIS — Z20828 Contact with and (suspected) exposure to other viral communicable diseases: Secondary | ICD-10-CM | POA: Insufficient documentation

## 2019-08-27 DIAGNOSIS — Z20822 Contact with and (suspected) exposure to covid-19: Secondary | ICD-10-CM

## 2019-08-28 LAB — NOVEL CORONAVIRUS, NAA: SARS-CoV-2, NAA: NOT DETECTED

## 2019-08-29 ENCOUNTER — Telehealth: Payer: Self-pay | Admitting: General Practice

## 2019-08-29 NOTE — Telephone Encounter (Signed)
Negative COVID results given. Patient results "NOT Detected." Caller expressed understanding. ° °

## 2019-09-16 ENCOUNTER — Ambulatory Visit: Payer: Self-pay | Admitting: Gerontology

## 2019-09-21 ENCOUNTER — Emergency Department
Admission: EM | Admit: 2019-09-21 | Discharge: 2019-09-21 | Disposition: A | Discharge status: Home / Self Care | Payer: Self-pay | Attending: Emergency Medicine | Admitting: Emergency Medicine

## 2019-09-21 ENCOUNTER — Encounter: Payer: Self-pay | Admitting: Emergency Medicine

## 2019-09-21 ENCOUNTER — Other Ambulatory Visit: Payer: Self-pay

## 2019-09-21 DIAGNOSIS — J0100 Acute maxillary sinusitis, unspecified: Secondary | ICD-10-CM | POA: Insufficient documentation

## 2019-09-21 DIAGNOSIS — F17210 Nicotine dependence, cigarettes, uncomplicated: Secondary | ICD-10-CM | POA: Insufficient documentation

## 2019-09-21 DIAGNOSIS — Z79899 Other long term (current) drug therapy: Secondary | ICD-10-CM | POA: Insufficient documentation

## 2019-09-21 MED ORDER — FEXOFENADINE-PSEUDOEPHED ER 60-120 MG PO TB12: 1.0000 | ORAL_TABLET | Freq: Two times a day (BID) | ORAL | 0 refills | Status: DC

## 2019-09-21 MED ORDER — AMOXICILLIN 875 MG PO TABS: 875.0000 mg | ORAL_TABLET | Freq: Two times a day (BID) | ORAL | 0 refills | Status: DC

## 2019-09-21 NOTE — ED Notes (Signed)
See triage note  Presents with "runny nose"  States he had COVID at the end of dec  Was recently released to go back to work  States he was caught in the rain on Friday  Then sxs' became worse  Afebrile on arrival

## 2019-09-21 NOTE — ED Triage Notes (Signed)
Pt reports thinks he has a sinus infection. Pt reports sinus pressure and pain and nasal congestion since it rained the other day.

## 2019-09-21 NOTE — ED Provider Notes (Signed)
[facility] Emergency Department Provider Note   ____________________________________________   First MD Initiated Contact with Patient 09/21/19 1358     (approximate)  I have reviewed the triage vital signs and the nursing notes.   HISTORY  Chief Complaint Facial Pain and Nasal Congestion    HPI Matthew [name] is a 48 y.o. male patient presents with 1 week of sinus pressure nasal congestion.  Patient state he believes after being out in the rain last week.  Patient denies fever associated with complaint.  Patient stated cough secondary to postnasal drainage.  Patient denies recent travel or known exposure to COVID-19.  Patient rates his facial pain to 6/10.  Patient described as "pain/pressure.  No palliative measure for complaint.         Past Medical History:  Diagnosis Date  . Arthritis   . Hx of blood clots   . Sleep apnea     Patient Active Problem List   Diagnosis Date Noted  . Prediabetes 04/25/2019  . Wheezing on auscultation 04/25/2019  . Ingrown toenail of right foot 04/23/2019  . Atrial fibrillation ([place]) 03/19/2019  . DVT (deep venous thrombosis) ([place]) 03/19/2019  . Bilateral pneumonia 03/11/2019  . Acute DVT (deep venous thrombosis) ([place]) 02/17/2019  . DVT of lower extremity (deep venous thrombosis) ([place]) 11/19/2018    Past Surgical History:  Procedure Laterality Date  . IVC FILTER INSERTION Left 11/24/2018   Procedure: IVC FILTER INSERTION;  Surgeon: Algernon Huxley, MD;  Location: [place] [facility];  Service: Cardiovascular;  Laterality: Left;  . IVC FILTER INSERTION Right 02/18/2019   Procedure: IVC FILTER INSERTION WITH RIGHT LOWER EXTREMITY VENOUS LYSIS;  Surgeon: Katha Cabal, MD;  Location: [place] [facility];  Service: Cardiovascular;  Laterality: Right;  . IVC FILTER REMOVAL N/A 12/01/2018   Procedure: IVC FILTER REMOVAL;  Surgeon: Algernon Huxley, MD;  Location: [place] [facility];  Service: Cardiovascular;   Laterality: N/A;  . NO PAST SURGERIES      Prior to Admission medications   Medication Sig Start Date End Date Taking? Authorizing Provider  albuterol (ACCUNEB) 1.25 MG/3ML nebulizer solution Take 1 ampule by nebulization every 6 (six) hours as needed for wheezing.    [provider]  amoxicillin (AMOXIL) 875 MG tablet Take 1 tablet (875 mg total) by mouth 2 (two) times daily. 09/21/19   Sable Feil, PA-C  diltiazem (CARDIZEM CD) 120 MG 24 hr capsule Take 1 capsule (120 mg total) by mouth daily. 04/29/19 08/13/19  Iloabachie, Chioma E, NP  fexofenadine-pseudoephedrine (ALLEGRA-D) 60-120 MG 12 hr tablet Take 1 tablet by mouth 2 (two) times daily. 09/21/19   Sable Feil, PA-C  rivaroxaban (XARELTO) 20 MG TABS tablet Take 20 mg by mouth daily with supper.    [provider]    Allergies Patient has no known allergies.  No family history on file.  Social History Social History   Tobacco Use  . Smoking status: Current Every Day Smoker    Packs/day: 0.50    Types: Cigarettes  . Smokeless tobacco: Never Used  Substance Use Topics  . Alcohol use: Yes    Comment: occassional  . Drug use: Never    Review of Systems Constitutional: No fever/chills Eyes: No visual changes. ENT: No sore throat.  Nasal congestion and facial pain. Cardiovascular: Denies chest pain. Respiratory: Denies shortness of breath. Gastrointestinal: No abdominal pain.  No nausea, no vomiting.  No diarrhea.  No constipation. Genitourinary: Negative for  dysuria. Musculoskeletal: Negative for back pain. Skin: Negative for rash. Neurological: Negative for headaches, focal weakness or numbness.   ____________________________________________   PHYSICAL EXAM:  VITAL SIGNS: ED Triage Vitals  Enc Vitals Group     BP 09/21/19 1401 125/86     Pulse Rate 09/21/19 1401 61     Resp 09/21/19 1401 18     Temp 09/21/19 1401 98.6 F (37 C)     Temp Source 09/21/19 1401 Oral     SpO2 09/21/19  1401 97 %     Weight 09/21/19 1344 286 lb (129.7 kg)     Height 09/21/19 1344 5\' 7"  (1.702 m)     Head Circumference --      Peak Flow --      Pain Score 09/21/19 1344 6     Pain Loc --      Pain Edu? --      Excl. in GC? --    Constitutional: Alert and oriented. Well appearing and in no acute distress.  Morbid obesity. Nose: Edematous nasal turbinates.  Left maxillary guarding.  Mouth/Throat: Mucous membranes are moist.  Oropharynx non-erythematous.  Postnasal drainage. Neck: No stridor.  Hematological/Lymphatic/Immunilogical: No cervical lymphadenopathy. Cardiovascular: Normal rate, regular rhythm. Grossly normal heart sounds.  Good peripheral circulation. Respiratory: Normal respiratory effort.  No retractions. Lungs CTAB. Musculoskeletal: No lower extremity tenderness nor edema.  No joint effusions. Neurologic:  Normal speech and language. No gross focal neurologic deficits are appreciated. No gait instability. Skin:  Skin is warm, dry and intact. No rash noted. Psychiatric: Mood and affect are normal. Speech and behavior are normal.  ____________________________________________   LABS (all labs ordered are listed, but only abnormal results are displayed)  Labs Reviewed - No data to display ____________________________________________  EKG   ____________________________________________  RADIOLOGY  ED MD interpretation:    Official radiology report(s): No results found.  ____________________________________________   PROCEDURES  Procedure(s) performed (including Critical Care):  Procedures   ____________________________________________   INITIAL IMPRESSION / ASSESSMENT AND PLAN / ED COURSE  As part of my medical decision making, I reviewed the following data within the electronic MEDICAL RECORD NUMBER      Patient presents with 1 week of nasal congestion and facial pain.  Physical exam is consistent with left subacute maxillary sinusitis.  Patient given  discharge care instruction advised take medication as directed.  [facility]   Matthew [name] was evaluated in Emergency Department on 09/21/2019 for the symptoms described in the history of present illness. He was evaluated in the context of the global COVID-19 pandemic, which necessitated consideration that the patient might be at risk for infection with the SARS-CoV-2 virus that causes COVID-19. Institutional protocols and algorithms that pertain to the evaluation of patients at risk for COVID-19 are in a state of rapid change based on information released by regulatory bodies including the CDC and federal and state organizations. These policies and algorithms were followed during the patient's care in the ED.        ____________________________________________   FINAL CLINICAL IMPRESSION(S) / ED DIAGNOSES  Final diagnoses:  Subacute maxillary sinusitis     ED Discharge Orders         Ordered    amoxicillin (AMOXIL) 875 MG tablet  2 times daily     09/21/19 1419    fexofenadine-pseudoephedrine (ALLEGRA-D) 60-120 MG 12 hr tablet  2 times daily     09/21/19 1419  Note:  This document was prepared using Dragon voice recognition software and may include unintentional dictation errors.    Joni Reining, PA-C 09/21/19 1433    Emily Filbert, MD 09/21/19 [phone]

## 2019-10-02 ENCOUNTER — Emergency Department
Admission: EM | Admit: 2019-10-02 | Discharge: 2019-10-02 | Disposition: A | Discharge status: Home / Self Care | Payer: Self-pay | Attending: Emergency Medicine | Admitting: Emergency Medicine

## 2019-10-02 ENCOUNTER — Other Ambulatory Visit: Payer: Self-pay

## 2019-10-02 ENCOUNTER — Emergency Department: Payer: Self-pay

## 2019-10-02 DIAGNOSIS — Z79899 Other long term (current) drug therapy: Secondary | ICD-10-CM | POA: Insufficient documentation

## 2019-10-02 DIAGNOSIS — Z7901 Long term (current) use of anticoagulants: Secondary | ICD-10-CM | POA: Insufficient documentation

## 2019-10-02 DIAGNOSIS — F17210 Nicotine dependence, cigarettes, uncomplicated: Secondary | ICD-10-CM | POA: Insufficient documentation

## 2019-10-02 DIAGNOSIS — M79604 Pain in right leg: Secondary | ICD-10-CM | POA: Insufficient documentation

## 2019-10-02 MED ORDER — TRAMADOL HCL 50 MG PO TABS: 50.0000 mg | ORAL_TABLET | Freq: Four times a day (QID) | ORAL | 0 refills | Status: DC | PRN

## 2019-10-02 MED ORDER — TRAMADOL HCL 50 MG PO TABS: 50.0000 mg | ORAL_TABLET | Freq: Four times a day (QID) | ORAL | 0 refills | Status: AC | PRN

## 2019-10-02 MED ORDER — SULFAMETHOXAZOLE-TRIMETHOPRIM 800-160 MG PO TABS: 1.0000 | ORAL_TABLET | Freq: Two times a day (BID) | ORAL | 0 refills | Status: AC

## 2019-10-02 MED ORDER — SULFAMETHOXAZOLE-TRIMETHOPRIM 800-160 MG PO TABS: 1.0000 | ORAL_TABLET | Freq: Two times a day (BID) | ORAL | 0 refills | Status: DC

## 2019-10-02 NOTE — ED Provider Notes (Signed)
Emergency Department Provider Note  ____________________________________________  Time seen: Approximately 7:53 PM  I have reviewed the triage vital signs and the nursing notes.   HISTORY  Chief Complaint Foot Pain   Historian Patient    HPI Matthew [name] is a 49 y.o. male presents to the emergency department with acute right ankle pain.  Patient states that he sustained an inversion type injury while standing up from the toilet.  Patient also complains of some erythema along the right upper thigh.  He denies fever and chills at home.  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  He states he is supposed be taking Xarelto but states that he sometimes misses a dose for a couple of days at a time.  No chest pain, chest tightness or abdominal pain.  Past Medical History:  Diagnosis Date  . Arthritis   . Hx of blood clots   . Sleep apnea      Immunizations up to date:  Yes.     Past Medical History:  Diagnosis Date  . Arthritis   . Hx of blood clots   . Sleep apnea     Patient Active Problem List   Diagnosis Date Noted  . Prediabetes 04/25/2019  . Wheezing on auscultation 04/25/2019  . Ingrown toenail of right foot 04/23/2019  . Atrial fibrillation (HCC) 03/19/2019  . DVT (deep venous thrombosis) (HCC) 03/19/2019  . Bilateral pneumonia 03/11/2019  . Acute DVT (deep venous thrombosis) (HCC) 02/17/2019  . DVT of lower extremity (deep venous thrombosis) (HCC) 11/19/2018    Past Surgical History:  Procedure Laterality Date  . IVC FILTER INSERTION Left 11/24/2018   Procedure: IVC FILTER INSERTION;  Surgeon: Annice Needy, MD;  Location: [facility];  Service: Cardiovascular;  Laterality: Left;  . IVC FILTER INSERTION Right 02/18/2019   Procedure: IVC FILTER INSERTION WITH RIGHT LOWER EXTREMITY VENOUS LYSIS;  Surgeon: Renford Dills, MD;  Location: [facility];  Service: Cardiovascular;  Laterality:  Right;  . IVC FILTER REMOVAL N/A 12/01/2018   Procedure: IVC FILTER REMOVAL;  Surgeon: Annice Needy, MD;  Location: [facility];  Service: Cardiovascular;  Laterality: N/A;  . NO PAST SURGERIES      Prior to Admission medications   Medication Sig Start Date End Date Taking? Authorizing Provider  albuterol (ACCUNEB) 1.25 MG/3ML nebulizer solution Take 1 ampule by nebulization every 6 (six) hours as needed for wheezing.    [provider]  amoxicillin (AMOXIL) 875 MG tablet Take 1 tablet (875 mg total) by mouth 2 (two) times daily. 09/21/19   Joni Reining, PA-C  diltiazem (CARDIZEM CD) 120 MG 24 hr capsule Take 1 capsule (120 mg total) by mouth daily. 04/29/19 08/13/19  Iloabachie, Chioma E, NP  fexofenadine-pseudoephedrine (ALLEGRA-D) 60-120 MG 12 hr tablet Take 1 tablet by mouth 2 (two) times daily. 09/21/19   Joni Reining, PA-C  rivaroxaban (XARELTO) 20 MG TABS tablet Take 20 mg by mouth daily with supper.    [provider]  sulfamethoxazole-trimethoprim (BACTRIM DS) 800-160 MG tablet Take 1 tablet by mouth 2 (two) times daily for 7 days. 10/02/19 10/09/19  Orvil Feil, PA-C  traMADol (ULTRAM) 50 MG tablet Take 1 tablet (50 mg total) by mouth every 6 (six) hours as needed for up to 3 days. 10/02/19 10/05/19  Orvil Feil, PA-C    Allergies Patient has no known allergies.  No family history  on file.  Social History Social History   Tobacco Use  . Smoking status: Current Every Day Smoker    Packs/day: 0.50    Types: Cigarettes  . Smokeless tobacco: Never Used  Substance Use Topics  . Alcohol use: Yes    Comment: occassional  . Drug use: Never     Review of Systems  Constitutional: No fever/chills Eyes:  No discharge ENT: No upper respiratory complaints. Respiratory: no cough. No SOB/ use of accessory muscles to breath Gastrointestinal:   No nausea, no vomiting.  No diarrhea.  No constipation. Musculoskeletal: Patient has right ankle pain.   Skin: Patient has erythema of right thigh.    ____________________________________________   PHYSICAL EXAM:  VITAL SIGNS: ED Triage Vitals  Enc Vitals Group     BP 10/02/19 1552 131/78     Pulse Rate 10/02/19 1552 71     Resp 10/02/19 1552 16     Temp 10/02/19 1552 98.7 F (37.1 C)     Temp Source 10/02/19 1552 Oral     SpO2 10/02/19 1552 97 %     Weight 10/02/19 1555 265 lb (120.2 kg)     Height 10/02/19 1555 5\' 7"  (1.702 m)     Head Circumference --      Peak Flow --      Pain Score 10/02/19 1605 7     Pain Loc --      Pain Edu? --      Excl. in GC? --      Constitutional: Alert and oriented. Well appearing and in no acute distress. Eyes: Conjunctivae are normal. PERRL. EOMI. Head: Atraumatic. Cardiovascular: Normal rate, regular rhythm. Normal S1 and S2.  Good peripheral circulation. Respiratory: Normal respiratory effort without tachypnea or retractions. Lungs CTAB. Good air entry to the bases with no decreased or absent breath sounds Gastrointestinal: Bowel sounds x 4 quadrants. Soft and nontender to palpation. No guarding or rigidity. No distention. Musculoskeletal: Patient demonstrates limited range of motion at the right ankle, likely secondary to pain.  He has tenderness along the anterior talofibular ligament and the deltoid ligament.  Palpable dorsalis pedis pulse, right. Neurologic:  Normal for age. No gross focal neurologic deficits are appreciated.  Skin: Patient has 2 cm x 1 cm region of circumferential cellulitis along right proximal thigh. Psychiatric: Mood and affect are normal for age. Speech and behavior are normal.   ____________________________________________   LABS (all labs ordered are listed, but only abnormal results are displayed)  Labs Reviewed - No data to display ____________________________________________  EKG   ____________________________________________  RADIOLOGY 10/04/19, personally viewed and evaluated these  images (plain radiographs) as part of my medical decision making, as well as reviewing the written report by the radiologist.  Geraldo Pitter Venous Img Lower Unilateral Right  Result Date: 10/02/2019 CLINICAL DATA:  Right leg swelling 4 days. EXAM: RIGHT LOWER EXTREMITY VENOUS DOPPLER ULTRASOUND TECHNIQUE: Gray-scale sonography with graded compression, as well as color Doppler and duplex ultrasound were performed to evaluate the lower extremity deep venous systems from the level of the common femoral vein and including the common femoral, femoral, profunda femoral, popliteal and calf veins including the posterior tibial, peroneal and gastrocnemius veins when visible. The superficial great saphenous vein was also interrogated. Spectral Doppler was utilized to evaluate flow at rest and with distal augmentation maneuvers in the common femoral, femoral and popliteal veins. COMPARISON:  None. FINDINGS: Contralateral Common Femoral Vein: Respiratory phasicity is normal and symmetric with the symptomatic side.  No evidence of thrombus. Normal compressibility. Common Femoral Vein: No evidence of thrombus. Normal compressibility, respiratory phasicity and response to augmentation. Saphenofemoral Junction: No evidence of thrombus. Normal compressibility and flow on color Doppler imaging. Profunda Femoral Vein: No evidence of thrombus. Normal compressibility and flow on color Doppler imaging. Femoral Vein: No evidence of thrombus. Normal compressibility, respiratory phasicity and response to augmentation. Popliteal Vein: No evidence of thrombus. Normal compressibility, respiratory phasicity and response to augmentation. Calf Veins: No evidence of thrombus. Normal compressibility and flow on color Doppler imaging. Superficial Great Saphenous Vein: No evidence of thrombus. Normal compressibility. Venous Reflux:  None. Other Findings:  None. IMPRESSION: No evidence of deep venous thrombosis. Electronically Signed   By: Elberta Fortis M.D.    On: 10/02/2019 19:00   DG Foot Complete Right  Result Date: 10/02/2019 CLINICAL DATA:  49 year old male with right foot pain and numbness after sitting on the toilet. EXAM: RIGHT FOOT COMPLETE - 3+ VIEW COMPARISON:  None. FINDINGS: There is no evidence of fracture or dislocation. There is no evidence of arthropathy or other focal bone abnormality. Soft tissues are unremarkable. IMPRESSION: Negative. Electronically Signed   By: Malachy Moan M.D.   On: 10/02/2019 16:49    ____________________________________________    PROCEDURES  Procedure(s) performed:     Procedures     Medications - No data to display   ____________________________________________   INITIAL IMPRESSION / ASSESSMENT AND PLAN / ED COURSE  Pertinent labs & imaging results that were available during my care of the patient were reviewed by me and considered in my medical decision making (see chart for details).      Assessment and plan Pain of right lower extremity 49 year old male presents to the emergency department with multiple medical complaints.  He states that he is primarily being seen for right ankle pain after an inversion type injury while ambulating from the toilet.  He also states that he has had a small region of redness that is painful to the touch along right proximal thigh.  Vital signs were reassuring at triage.  On physical exam, patient had a 2 cm x 1 cm region of circumferential erythema of the right proximal thigh.  He did have tenderness to palpation along the anterior talofibular ligament and the deltoid ligament on the right.  He had palpable pulses of the right foot and had capillary refill less than 2 seconds.  Differential diagnosis included cellulitis, DVT and ankle sprain  Venous ultrasound revealed no evidence of thrombus.  Patient was discharged with Bactrim for cellulitis and a short course of tramadol for pain.  He was advised to follow-up with podiatry regarding ankle  pain.  Return precautions were given to return with new or worsening symptoms.  All patient questions were answered.  ____________________________________________  FINAL CLINICAL IMPRESSION(S) / ED DIAGNOSES  Final diagnoses:  Pain of right lower extremity      NEW MEDICATIONS STARTED DURING THIS VISIT:  ED Discharge Orders         Ordered    sulfamethoxazole-trimethoprim (BACTRIM DS) 800-160 MG tablet  2 times daily,   Status:  Discontinued     10/02/19 1934    traMADol (ULTRAM) 50 MG tablet  Every 6 hours PRN,   Status:  Discontinued     10/02/19 1936    traMADol (ULTRAM) 50 MG tablet  Every 6 hours PRN,   Status:  Discontinued     10/02/19 1937    sulfamethoxazole-trimethoprim (BACTRIM DS) 800-160 MG tablet  2  times daily     10/02/19 1939    traMADol (ULTRAM) 50 MG tablet  Every 6 hours PRN     10/02/19 1939              This chart was dictated using voice recognition software/Dragon. Despite best efforts to proofread, errors can occur which can change the meaning. Any change was purely unintentional.     Lannie Fields, PA-C 10/02/19 1959    Earleen Newport, MD 10/02/19 2225

## 2019-10-02 NOTE — ED Triage Notes (Signed)
FIRST NURSE NOTE- here for ankle pain. Pt reports he will sit and smoke in bathroom and play his game for sometimes 2-3 hours and Monday he went to get up and foot was asleep and somehow hurt his ankle.  Pt ambulatory into ED. NAD

## 2019-10-02 NOTE — ED Triage Notes (Signed)
Pt to the er for pain to the right foot. Pt says he was sitting on the toilet for an extended period of time and when he got up his foot was numb and now it hurts. This occurred Monday night.

## 2019-10-02 NOTE — ED Notes (Signed)
Pt not happy with the post op shoe. Shoe removed and ace wrap applied then the shoe reapplied. Pt wheeled out to the lobby to await his ride.

## 2019-10-29 ENCOUNTER — Encounter: Payer: Self-pay | Admitting: Emergency Medicine

## 2019-10-29 ENCOUNTER — Emergency Department: Payer: Self-pay

## 2019-10-29 ENCOUNTER — Inpatient Hospital Stay
Admission: EM | Admit: 2019-10-29 | Discharge: 2019-10-30 | DRG: 272 | Disposition: A | Discharge status: Home / Self Care | Payer: Self-pay | Attending: Internal Medicine | Admitting: Internal Medicine

## 2019-10-29 ENCOUNTER — Other Ambulatory Visit (INDEPENDENT_AMBULATORY_CARE_PROVIDER_SITE_OTHER): Payer: Self-pay | Admitting: Vascular Surgery

## 2019-10-29 ENCOUNTER — Other Ambulatory Visit: Payer: Self-pay

## 2019-10-29 DIAGNOSIS — Z720 Tobacco use: Secondary | ICD-10-CM

## 2019-10-29 DIAGNOSIS — I82502 Chronic embolism and thrombosis of unspecified deep veins of left lower extremity: Secondary | ICD-10-CM

## 2019-10-29 DIAGNOSIS — Z20822 Contact with and (suspected) exposure to covid-19: Secondary | ICD-10-CM | POA: Diagnosis present

## 2019-10-29 DIAGNOSIS — Z86718 Personal history of other venous thrombosis and embolism: Secondary | ICD-10-CM

## 2019-10-29 DIAGNOSIS — I82409 Acute embolism and thrombosis of unspecified deep veins of unspecified lower extremity: Secondary | ICD-10-CM | POA: Diagnosis present

## 2019-10-29 DIAGNOSIS — Z95828 Presence of other vascular implants and grafts: Secondary | ICD-10-CM

## 2019-10-29 DIAGNOSIS — I4891 Unspecified atrial fibrillation: Secondary | ICD-10-CM | POA: Diagnosis present

## 2019-10-29 DIAGNOSIS — I480 Paroxysmal atrial fibrillation: Secondary | ICD-10-CM | POA: Diagnosis present

## 2019-10-29 DIAGNOSIS — R7303 Prediabetes: Secondary | ICD-10-CM | POA: Diagnosis present

## 2019-10-29 DIAGNOSIS — I82411 Acute embolism and thrombosis of right femoral vein: Principal | ICD-10-CM | POA: Diagnosis present

## 2019-10-29 DIAGNOSIS — F17210 Nicotine dependence, cigarettes, uncomplicated: Secondary | ICD-10-CM | POA: Diagnosis present

## 2019-10-29 DIAGNOSIS — I824Y1 Acute embolism and thrombosis of unspecified deep veins of right proximal lower extremity: Secondary | ICD-10-CM

## 2019-10-29 DIAGNOSIS — Z9114 Patient's other noncompliance with medication regimen: Secondary | ICD-10-CM

## 2019-10-29 DIAGNOSIS — M1990 Unspecified osteoarthritis, unspecified site: Secondary | ICD-10-CM | POA: Diagnosis present

## 2019-10-29 DIAGNOSIS — G4730 Sleep apnea, unspecified: Secondary | ICD-10-CM | POA: Diagnosis present

## 2019-10-29 LAB — CBC
HCT: 56.9 % — ABNORMAL HIGH (ref 39.0–52.0)
Hemoglobin: 19 g/dL — ABNORMAL HIGH (ref 13.0–17.0)
MCH: 33.2 pg (ref 26.0–34.0)
MCHC: 33.4 g/dL (ref 30.0–36.0)
MCV: 99.5 fL (ref 80.0–100.0)
Platelets: 128 10*3/uL — ABNORMAL LOW (ref 150–400)
RBC: 5.72 MIL/uL (ref 4.22–5.81)
RDW: 14.6 % (ref 11.5–15.5)
WBC: 10.2 10*3/uL (ref 4.0–10.5)
nRBC: 0 % (ref 0.0–0.2)

## 2019-10-29 LAB — PROTIME-INR
INR: 1.1 (ref 0.8–1.2)
Prothrombin Time: 14 seconds (ref 11.4–15.2)

## 2019-10-29 LAB — COMPREHENSIVE METABOLIC PANEL
ALT: 15 U/L (ref 0–44)
AST: 22 U/L (ref 15–41)
Albumin: 3.5 g/dL (ref 3.5–5.0)
Alkaline Phosphatase: 151 U/L — ABNORMAL HIGH (ref 38–126)
Anion gap: 9 (ref 5–15)
BUN: 12 mg/dL (ref 6–20)
CO2: 30 mmol/L (ref 22–32)
Calcium: 8.9 mg/dL (ref 8.9–10.3)
Chloride: 98 mmol/L (ref 98–111)
Creatinine, Ser: 0.67 mg/dL (ref 0.61–1.24)
GFR calc Af Amer: >60 mL/min (ref >60)
GFR calc non Af Amer: >60 mL/min (ref >60)
Glucose, Bld: 114 mg/dL — ABNORMAL HIGH (ref 70–99)
Potassium: 4.4 mmol/L (ref 3.5–5.1)
Sodium: 137 mmol/L (ref 135–145)
Total Bilirubin: 1.4 mg/dL — ABNORMAL HIGH (ref 0.3–1.2)
Total Protein: 8.4 g/dL — ABNORMAL HIGH (ref 6.5–8.1)

## 2019-10-29 LAB — HEPARIN LEVEL (UNFRACTIONATED)
Heparin Unfractionated: <0.1 IU/mL — ABNORMAL LOW (ref 0.30–0.70)
Heparin Unfractionated: 0.49 IU/mL (ref 0.30–0.70)
Heparin Unfractionated: 0.27 IU/mL — ABNORMAL LOW (ref 0.30–0.70)

## 2019-10-29 LAB — RESPIRATORY PANEL BY RT PCR (FLU A&B, COVID)
Influenza A by PCR: NEGATIVE
Influenza B by PCR: NEGATIVE
SARS Coronavirus 2 by RT PCR: NEGATIVE

## 2019-10-29 LAB — APTT: aPTT: 29 seconds (ref 24–36)

## 2019-10-29 MED ORDER — ONDANSETRON HCL 4 MG PO TABS: 4.0000 mg | ORAL_TABLET | Freq: Four times a day (QID) | ORAL | Status: DC | PRN

## 2019-10-29 MED ORDER — PNEUMOCOCCAL VAC POLYVALENT 25 MCG/0.5ML IJ INJ: 0.5000 mL | INJECTION | INTRAMUSCULAR | Status: DC

## 2019-10-29 MED ORDER — ACETAMINOPHEN 650 MG RE SUPP: 650.0000 mg | Freq: Four times a day (QID) | RECTAL | Status: DC | PRN

## 2019-10-29 MED ORDER — INFLUENZA VAC SPLIT QUAD 0.5 ML IM SUSY: 0.5000 mL | PREFILLED_SYRINGE | INTRAMUSCULAR | Status: DC

## 2019-10-29 MED ORDER — MORPHINE SULFATE (PF) 2 MG/ML IV SOLN
2.0000 mg | INTRAVENOUS | Status: DC | PRN
  Administered 2019-10-29 – 2019-10-30 (×5): 2 mg via INTRAVENOUS
  Filled 2019-10-29 (×5): qty 1

## 2019-10-29 MED ORDER — NICOTINE 21 MG/24HR TD PT24
21.0000 mg | MEDICATED_PATCH | Freq: Every day | TRANSDERMAL | Status: DC
  Administered 2019-10-29 – 2019-10-30 (×2): 21 mg via TRANSDERMAL
  Filled 2019-10-29 (×2): qty 1

## 2019-10-29 MED ORDER — OXYCODONE-ACETAMINOPHEN 5-325 MG PO TABS
1.0000 | ORAL_TABLET | ORAL | Status: DC | PRN
  Filled 2019-10-29: qty 1

## 2019-10-29 MED ORDER — ONDANSETRON HCL 4 MG/2ML IJ SOLN
4.0000 mg | Freq: Four times a day (QID) | INTRAMUSCULAR | Status: DC | PRN
  Administered 2019-10-29: 20:00:00 4 mg via INTRAVENOUS
  Filled 2019-10-29 (×2): qty 2

## 2019-10-29 MED ORDER — HEPARIN (PORCINE) 25000 UT/250ML-% IV SOLN
1550.0000 [IU]/h | INTRAVENOUS | Status: DC
  Administered 2019-10-29 – 2019-10-30 (×2): 1400 [IU]/h via INTRAVENOUS
  Filled 2019-10-29 (×2): qty 250

## 2019-10-29 MED ORDER — SODIUM CHLORIDE 0.9 % IV SOLN: INTRAVENOUS | Status: DC

## 2019-10-29 MED ORDER — ALBUTEROL SULFATE (2.5 MG/3ML) 0.083% IN NEBU: 2.5000 mg | INHALATION_SOLUTION | Freq: Four times a day (QID) | RESPIRATORY_TRACT | Status: DC | PRN

## 2019-10-29 MED ORDER — ONDANSETRON HCL 4 MG/2ML IJ SOLN
4.0000 mg | Freq: Once | INTRAMUSCULAR | Status: AC
  Administered 2019-10-29: 09:00:00 4 mg via INTRAVENOUS
  Filled 2019-10-29: qty 2

## 2019-10-29 MED ORDER — MORPHINE SULFATE (PF) 4 MG/ML IV SOLN
4.0000 mg | Freq: Once | INTRAVENOUS | Status: AC
  Administered 2019-10-29: 09:00:00 4 mg via INTRAVENOUS
  Filled 2019-10-29: qty 1

## 2019-10-29 MED ORDER — HEPARIN BOLUS VIA INFUSION
5500.0000 [IU] | Freq: Once | INTRAVENOUS | Status: AC
  Administered 2019-10-29: 11:00:00 5500 [IU] via INTRAVENOUS
  Filled 2019-10-29: qty 5500

## 2019-10-29 MED ORDER — SENNOSIDES-DOCUSATE SODIUM 8.6-50 MG PO TABS: 1.0000 | ORAL_TABLET | Freq: Every evening | ORAL | Status: DC | PRN

## 2019-10-29 MED ORDER — ACETAMINOPHEN 325 MG PO TABS: 650.0000 mg | ORAL_TABLET | Freq: Four times a day (QID) | ORAL | Status: DC | PRN

## 2019-10-29 NOTE — Progress Notes (Signed)
ANTICOAGULATION CONSULT NOTE - Initial Consult  Pharmacy Consult for Heparin drip Indication: DVT  (supposed to be on Xarelto PTA but hasnt taken in 2 weeks)  No Known Allergies  Patient Measurements: Height: 5\' 7"  (170.2 cm) Weight: 260 lb (117.9 kg) IBW/kg (Calculated) : 66.1 Heparin Dosing Weight: 93.2 kg  Vital Signs: Temp: 98.9 F (37.2 C) (02/25 1238) Temp Source: Oral (02/25 1238) BP: 107/64 (02/25 1238) Pulse Rate: 85 (02/25 1238)  Labs: Recent Labs    10/29/19 0839 10/29/19 1645  HGB 19.0*  --   HCT 56.9*  --   PLT 128*  --   APTT 29  --   LABPROT 14.0  --   INR 1.1  --   HEPARINUNFRC <0.10* 0.49  CREATININE 0.67  --     Estimated Creatinine Clearance: 137.1 mL/min (by C-G formula based on SCr of 0.67 mg/dL).   Medical History: Past Medical History:  Diagnosis Date  . Arthritis   . Hx of blood clots   . Sleep apnea    patient states not anymore    Medications:  Scheduled:  . [START ON 10/30/2019] influenza vac split quadrivalent PF  0.5 mL Intramuscular Tomorrow-1000  . nicotine  21 mg Transdermal Daily  . [START ON 10/30/2019] pneumococcal 23 valent vaccine  0.5 mL Intramuscular Tomorrow-1000   Infusions:  . [START ON 10/30/2019] sodium chloride    . heparin 1,400 Units/hr (10/29/19 1600)    Assessment: 49 yo M w/ + DVT.  Pt has hx of DVT, on Xarelto PTA but states he has not taken in at least 2 weeks. Hgb 19.0  Plt 128  APTT 29  INR 1.1  HL <0.10  2/25 1645 HL 0.49   Goal of Therapy:  Heparin level 0.3-0.7 units/ml Monitor platelets by anticoagulation protocol: Yes   Plan:  Heparin level therapeutic. Continue heparin infusion at 1400 units/hrs. Recheck heparin level in 6 hours. CBC daily while on heparin.    3/25, PharmD, BCPS 10/29/2019,5:52 PM

## 2019-10-29 NOTE — Consult Note (Signed)
[facility] VE[facility] Vascular Consult Note  MRN : [id number]  Matthew [name] is a 49 y.o. ([birth year]) male who presents with chief complaint of  Chief Complaint  Patient presents with  . Leg Pain   History of Present Illness:  The patient is a 49 year old male well-known to our service who presented to the [facility] emergency department via EMS complaining of progressively worsening pain and swelling to the right lower extremity.  The patient is well-known to our service as he has been diagnosed with DVT in the past with subsequent IVC filter insertion and venous thrombectomy / thrombolysis.   Patient stopped his Xarelto approximately 2 weeks ago.  Patient states he "forgot" to take it.  Endorses a history of progressively worsening right lower extremity pain and swelling x2 days.  Pain and swelling were similar in nature to his previous DVT - is what prompted him to seek medical attention.  Denies fever, nausea vomiting.  Denies shortness of breath or chest pain.  Venous duplex on 10/29/19: 1. Examination is positive for extensive occlusive DVT involving the entirety of the right lower extremity venous system. 2. Given provided history of previous left lower extremity DVT, there is no evidence of acute or chronic DVT within the contralateral left common femoral vein.  Vascular surgery was consulted by Dr. Brien Few for possible vascular intervention.   Current Facility-Administered Medications  Medication Dose Route Frequency Provider Last Rate Last Admin  . [START ON 10/30/2019] 0.9 %  sodium chloride infusion   Intravenous Continuous Olanna Percifield A, PA-C      . acetaminophen (TYLENOL) tablet 650 mg  650 mg Oral Q6H PRN Lorretta Harp, MD       Or  . acetaminophen (TYLENOL) suppository 650 mg  650 mg Rectal Q6H PRN Lorretta Harp, MD      . albuterol (PROVENTIL) (2.5 MG/3ML) 0.083% nebulizer solution 2.5 mg  2.5 mg Nebulization Q6H PRN Lorretta Harp, MD      .  heparin ADULT infusion 100 units/mL (25000 units/243mL sodium chloride 0.45%)  1,400 Units/hr Intravenous Continuous Lorretta Harp, MD 14 mL/hr at 10/29/19 0928 1,400 Units/hr at 10/29/19 0928  . [START ON 10/30/2019] influenza vac split quadrivalent PF (FLUARIX) injection 0.5 mL  0.5 mL Intramuscular Tomorrow-1000 Lorretta Harp, MD      . morphine 2 MG/ML injection 2 mg  2 mg Intravenous Q4H PRN Lorretta Harp, MD   2 mg at 10/29/19 1055  . nicotine (NICODERM CQ - dosed in mg/24 hours) patch 21 mg  21 mg Transdermal Daily Lorretta Harp, MD      . ondansetron [facility]) tablet 4 mg  4 mg Oral Q6H PRN Lorretta Harp, MD       Or  . ondansetron (ZOFRAN) injection 4 mg  4 mg Intravenous Q6H PRN Lorretta Harp, MD      . oxyCODONE-acetaminophen (PERCOCET/ROXICET) 5-325 MG per tablet 1 tablet  1 tablet Oral Q4H PRN Lorretta Harp, MD      . senna-docusate (Senokot-S) tablet 1 tablet  1 tablet Oral QHS PRN Lorretta Harp, MD       Past Medical History:  Diagnosis Date  . Arthritis   . Hx of blood clots   . Sleep apnea    patient states not anymore   Past Surgical History:  Procedure Laterality Date  . IVC FILTER INSERTION Left 11/24/2018   Procedure: IVC FILTER INSERTION;  Surgeon: Annice Needy, MD;  Location: [facility] INVASIVE CV LAB;  Service: Cardiovascular;  Laterality:  Left;  . IVC FILTER INSERTION Right 02/18/2019   Procedure: IVC FILTER INSERTION WITH RIGHT LOWER EXTREMITY VENOUS LYSIS;  Surgeon: Katha Cabal, MD;  Location: [place] CV LAB;  Service: Cardiovascular;  Laterality: Right;  . IVC FILTER REMOVAL N/A 12/01/2018   Procedure: IVC FILTER REMOVAL;  Surgeon: Algernon Huxley, MD;  Location: [place] CV LAB;  Service: Cardiovascular;  Laterality: N/A;  . NO PAST SURGERIES     Social History Social History   Tobacco Use  . Smoking status: Current Every Day Smoker    Packs/day: 0.50    Types: Cigarettes  . Smokeless tobacco: Current User  Substance Use Topics  . Alcohol use: Yes    Comment: weekends  says every third weekend a 12 pack  . Drug use: Never   Family History History reviewed. No pertinent family history.  Denies family history of peripheral artery disease, venous disease or renal disease.  No Known Allergies  REVIEW OF SYSTEMS (Negative unless checked)  Constitutional: [] Weight loss  [] Fever  [] Chills Cardiac: [] Chest pain   [] Chest pressure   [] Palpitations   [] Shortness of breath when laying flat   [] Shortness of breath at rest   [] Shortness of breath with exertion. Vascular:  [] Pain in legs with walking   [] Pain in legs at rest   [] Pain in legs when laying flat   [] Claudication   [] Pain in feet when walking  [] Pain in feet at rest  [] Pain in feet when laying flat   [x] History of DVT   [] Phlebitis   [x] Swelling in legs   [] Varicose veins   [] Non-healing ulcers Pulmonary:   [] Uses home oxygen   [] Productive cough   [] Hemoptysis   [] Wheeze  [] COPD   [] Asthma Neurologic:  [] Dizziness  [] Blackouts   [] Seizures   [] History of stroke   [] History of TIA  [] Aphasia   [] Temporary blindness   [] Dysphagia   [] Weakness or numbness in arms   [] Weakness or numbness in legs Musculoskeletal:  [] Arthritis   [] Joint swelling   [] Joint pain   [] Low back pain Hematologic:  [] Easy bruising  [] Easy bleeding   [] Hypercoagulable state   [] Anemic  [] Hepatitis Gastrointestinal:  [] Blood in stool   [] Vomiting blood  [] Gastroesophageal reflux/heartburn   [] Difficulty swallowing. Genitourinary:  [] Chronic kidney disease   [] Difficult urination  [] Frequent urination  [] Burning with urination   [] Blood in urine Skin:  [] Rashes   [] Ulcers   [] Wounds Psychological:  [] History of anxiety   []  History of major depression.  Physical Examination  Vitals:   10/29/19 1130 10/29/19 1145 10/29/19 1200 10/29/19 1238  BP:    107/64  Pulse:  89 92 85  Resp: 20 17 17    Temp:    98.9 F (37.2 C)  TempSrc:    Oral  SpO2:  93% 94% 95%  Weight:      Height:       Body mass index is 40.72 kg/m. Gen:  WD/WN,  NAD Head: [place]/AT, No temporalis wasting. Prominent temp pulse not noted. Ear/Nose/Throat: Hearing grossly intact, nares w/o erythema or drainage, oropharynx w/o Erythema/Exudate Eyes: Sclera non-icteric, conjunctiva clear Neck: Trachea midline.  No JVD.  Pulmonary:  Good air movement, respirations not labored, equal bilaterally.  Cardiac: RRR, normal S1, S2. Vascular:  Vessel Right Left  Radial Palpable Palpable  Ulnar Palpable Palpable  Brachial Palpable Palpable  Carotid Palpable, without bruit Palpable, without bruit  Aorta Not palpable N/A  Femoral Palpable Palpable  Popliteal Palpable Palpable  PT Palpable Palpable  DP Palpable Palpable   Right Lower Extremity: Thigh soft.  Calf soft.  Extremity is edematous and erythematous.  There is no acute vascular compromise noted to the extremity at this time.  Tender to palpation.  Extremities warm distally toes.  Gastrointestinal: soft, non-tender/non-distended. No guarding/reflex.  Musculoskeletal: M/S 5/5 throughout.  Extremities without ischemic changes.  No deformity or atrophy. No edema. Neurologic: Sensation grossly intact in extremities.  Symmetrical.  Speech is fluent. Motor exam as listed above. Psychiatric: Judgment intact, Mood & affect appropriate for pt's clinical situation. Dermatologic: No rashes or ulcers noted.  No cellulitis or open wounds. Lymph : No Cervical, Axillary, or Inguinal lymphadenopathy.  CBC Lab Results  Component Value Date   WBC 10.2 10/29/2019   HGB 19.0 (H) 10/29/2019   HCT 56.9 (H) 10/29/2019   MCV 99.5 10/29/2019   PLT 128 (L) 10/29/2019   BMET    Component Value Date/Time   NA 137 10/29/2019 0839   NA 143 09/10/2018 0930   K 4.4 10/29/2019 0839   CL 98 10/29/2019 0839   CO2 30 10/29/2019 0839   GLUCOSE 114 (H) 10/29/2019 0839   BUN 12 10/29/2019 0839   BUN 8 09/10/2018 0930   CREATININE 0.67 10/29/2019 0839   CALCIUM 8.9 10/29/2019 0839   GFRNONAA >60 10/29/2019 0839   GFRAA >60  10/29/2019 0839   Estimated Creatinine Clearance: 137.1 mL/min (by C-G formula based on SCr of 0.67 mg/dL).  COAG Lab Results  Component Value Date   INR 1.1 10/29/2019   INR 1.1 03/04/2019   INR 1.1 02/17/2019   Radiology US Venous Img Lower Unilateral Right  Result Date: 10/29/2019 CLINICAL DATA:  Right lower extremity pain and edema. History of previous left lower extremity DVT. Evaluate for right lower extremity DVT. EXAM: RIGHT LOWER EXTREMITY VENOUS DOPPLER ULTRASOUND TECHNIQUE: Gray-scale sonography with graded compression, as well as color Doppler and duplex ultrasound were performed to evaluate the lower extremity deep venous systems from the level of the common femoral vein and including the common femoral, femoral, profunda femoral, popliteal and calf veins including the posterior tibial, peroneal and gastrocnemius veins when visible. The superficial great saphenous vein was also interrogated. Spectral Doppler was utilized to evaluate flow at rest and with distal augmentation maneuvers in the common femoral, femoral and popliteal veins. COMPARISON:  None. FINDINGS: Contralateral Common Femoral Vein: Respiratory phasicity is normal and symmetric with the symptomatic side. No evidence of acute or chronic thrombus. Normal compressibility. There is mixed echogenic occlusive DVT involving the right common femoral vein (image 8, extending to involve the saphenofemoral junction (image 12) as well as the imaged aspects of the right deep femoral vein (image 14). There is hypoechoic expansile occlusive thrombus involving the proximal (image 17), mid (image 20) and distal (image 24) aspects of the right femoral vein, extending to involve the proximal (image 26) and distal (image 29) aspects of the right popliteal vein. The tibial veins are not well imaged though likely also thrombosed. Other Findings:  None. IMPRESSION: 1. Examination is positive for extensive occlusive DVT involving the entirety of  the right lower extremity venous system. 2. Given provided history of previous left lower extremity DVT, there is no evidence of acute or chronic DVT within the contralateral left common femoral vein. Electronically Signed   By: Simonne Come M.D.   On: 10/29/2019 09:38   US Venous Img Lower Unilateral Right  Result Date: 10/02/2019 CLINICAL DATA:  Right leg swelling 4 days. EXAM: RIGHT LOWER EXTREMITY  VENOUS DOPPLER ULTRASOUND TECHNIQUE: Gray-scale sonography with graded compression, as well as color Doppler and duplex ultrasound were performed to evaluate the lower extremity deep venous systems from the level of the common femoral vein and including the common femoral, femoral, profunda femoral, popliteal and calf veins including the posterior tibial, peroneal and gastrocnemius veins when visible. The superficial great saphenous vein was also interrogated. Spectral Doppler was utilized to evaluate flow at rest and with distal augmentation maneuvers in the common femoral, femoral and popliteal veins. COMPARISON:  None. FINDINGS: Contralateral Common Femoral Vein: Respiratory phasicity is normal and symmetric with the symptomatic side. No evidence of thrombus. Normal compressibility. Common Femoral Vein: No evidence of thrombus. Normal compressibility, respiratory phasicity and response to augmentation. Saphenofemoral Junction: No evidence of thrombus. Normal compressibility and flow on color Doppler imaging. Profunda Femoral Vein: No evidence of thrombus. Normal compressibility and flow on color Doppler imaging. Femoral Vein: No evidence of thrombus. Normal compressibility, respiratory phasicity and response to augmentation. Popliteal Vein: No evidence of thrombus. Normal compressibility, respiratory phasicity and response to augmentation. Calf Veins: No evidence of thrombus. Normal compressibility and flow on color Doppler imaging. Superficial Great Saphenous Vein: No evidence of thrombus. Normal  compressibility. Venous Reflux:  None. Other Findings:  None. IMPRESSION: No evidence of deep venous thrombosis. Electronically Signed   By: Elberta Fortis M.D.   On: 10/02/2019 19:00   DG Foot Complete Right  Result Date: 10/02/2019 CLINICAL DATA:  49 year old male with right foot pain and numbness after sitting on the toilet. EXAM: RIGHT FOOT COMPLETE - 3+ VIEW COMPARISON:  None. FINDINGS: There is no evidence of fracture or dislocation. There is no evidence of arthropathy or other focal bone abnormality. Soft tissues are unremarkable. IMPRESSION: Negative. Electronically Signed   By: Malachy Moan M.D.   On: 10/02/2019 16:49   Assessment/Plan The patient is a 49 year old male well-known to our service who presented to the [facility] emergency department via EMS complaining of progressively worsening pain and swelling to the right lower extremity.  1. 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  Patient stopped taking his Xarelto approximately 2 weeks ago - stating he "forgot".  Patient now presents with extensive occlusive DVT to the right lower extremity.  Due to the extensive nature of the patient's DVT, discomfort and symptoms recommend undergoing a right lower extremity venous thrombectomy / thrombolysis.  Procedure, risks and benefits went to the patient.  All questions answered.  The patient wishes to proceed.  We will plan on this tomorrow with Dr. Wyn Quaker  2.  Anticoagulation: Patient has a history of DVT.  Patient stopped his Xarelto approximately two weeks ago stating he "forgot".  Had long discussion with the patient in regard to the importance of anticoagulation.  Agree with initiation of heparin and will transition the patient back to Xarelto after the procedure.  3.  Tobacco Abuse: We had a discussion for approximately three minutes regarding the absolute need for smoking cessation  due to the deleterious nature of tobacco on the vascular system. We discussed the tobacco use would diminish patency of any intervention, and likely significantly worsen progression of disease. We discussed multiple agents for quitting including replacement therapy or medications to reduce cravings such as Chantix. The patient voices their understanding of the importance of smoking cessation.   Discussed with Dr. Weldon Inches, PA-C  10/29/2019 1:08 PM  This note was created with  Dragon medical transcription system.  Any error is purely unintentional

## 2019-10-29 NOTE — ED Provider Notes (Signed)
[facility] Emergency Department Provider Note  Time seen: 8:39 AM  I have reviewed the triage vital signs and the nursing notes.   HISTORY  Chief Complaint Leg Pain   HPI Matthew [name] is a 49 y.o. male with a past medical history of prior left lower extremity DVT, IVC filter, states he has not been taking his Xarelto as he should, presents to the emergency department for 2 days of right lower extremity discomfort swelling and discoloration.  According to the patient since yesterday he has been experiencing pain in the right lower extremity with decreased range of motion due to discomfort and swelling.  Noted today that it appeared discolored and more painful so he presented to the emergency department for evaluation.  Denies any shortness of breath or chest pain.  Patient states he has an IVC filter for a prior left lower extremity DVT but was told that that DVT had since resolved on his most recent ultrasound.  Patient denies any history of right lower extremity DVT in the past.  Denies any recent illnesses fever cough congestion nausea vomiting diarrhea.   Past Medical History:  Diagnosis Date  . Arthritis   . Hx of blood clots   . Sleep apnea     Patient Active Problem List   Diagnosis Date Noted  . Prediabetes 04/25/2019  . Wheezing on auscultation 04/25/2019  . Ingrown toenail of right foot 04/23/2019  . Atrial fibrillation (HCC) 03/19/2019  . DVT (deep venous thrombosis) (HCC) 03/19/2019  . Bilateral pneumonia 03/11/2019  . Acute DVT (deep venous thrombosis) (HCC) 02/17/2019  . DVT of lower extremity (deep venous thrombosis) (HCC) 11/19/2018    Past Surgical History:  Procedure Laterality Date  . IVC FILTER INSERTION Left 11/24/2018   Procedure: IVC FILTER INSERTION;  Surgeon: Annice Needy, MD;  Location: [facility] INVASIVE CV LAB;  Service: Cardiovascular;  Laterality: Left;  . IVC FILTER INSERTION Right 02/18/2019   Procedure: IVC FILTER INSERTION  WITH RIGHT LOWER EXTREMITY VENOUS LYSIS;  Surgeon: Renford Dills, MD;  Location: [facility] INVASIVE CV LAB;  Service: Cardiovascular;  Laterality: Right;  . IVC FILTER REMOVAL N/A 12/01/2018   Procedure: IVC FILTER REMOVAL;  Surgeon: Annice Needy, MD;  Location: [facility] INVASIVE CV LAB;  Service: Cardiovascular;  Laterality: N/A;  . NO PAST SURGERIES      Prior to Admission medications   Medication Sig Start Date End Date Taking? Authorizing Provider  albuterol (ACCUNEB) 1.25 MG/3ML nebulizer solution Take 1 ampule by nebulization every 6 (six) hours as needed for wheezing.    [provider]  amoxicillin (AMOXIL) 875 MG tablet Take 1 tablet (875 mg total) by mouth 2 (two) times daily. 09/21/19   Joni Reining, PA-C  diltiazem (CARDIZEM CD) 120 MG 24 hr capsule Take 1 capsule (120 mg total) by mouth daily. 04/29/19 08/13/19  Iloabachie, Chioma E, NP  fexofenadine-pseudoephedrine (ALLEGRA-D) 60-120 MG 12 hr tablet Take 1 tablet by mouth 2 (two) times daily. 09/21/19   Joni Reining, PA-C  rivaroxaban (XARELTO) 20 MG TABS tablet Take 20 mg by mouth daily with supper.    [provider]    No Known Allergies  No family history on file.  Social History Social History   Tobacco Use  . Smoking status: Current Every Day Smoker    Packs/day: 0.50    Types: Cigarettes  . Smokeless tobacco: Never Used  Substance Use Topics  . Alcohol use: Yes    Comment: occassional  .  Drug use: Never    Review of Systems Constitutional: Negative for fever. Cardiovascular: Negative for chest pain. Respiratory: Negative for shortness of breath. Gastrointestinal: Negative for abdominal pain Musculoskeletal: Pain and swelling to right lower extremity Skin: Discoloration right lower extremity Neurological: Negative for headache All other ROS negative  ____________________________________________   PHYSICAL EXAM:  VITAL SIGNS: ED Triage Vitals  Enc Vitals Group     BP 10/29/19 0832  106/73     Pulse Rate 10/29/19 0832 78     Resp 10/29/19 0832 16     Temp 10/29/19 0832 98.8 F (37.1 C)     Temp Source 10/29/19 0832 Oral     SpO2 10/29/19 0832 97 %     Weight 10/29/19 0830 260 lb (117.9 kg)     Height 10/29/19 0830 5\' 7"  (1.702 m)     Head Circumference --      Peak Flow --      Pain Score 10/29/19 0829 10     Pain Loc --      Pain Edu? --      Excl. in [place]? --    Constitutional: Alert and oriented. Well appearing and in no distress. Eyes: Normal exam ENT      Head: Normocephalic and atraumatic.      Mouth/Throat: Mucous membranes are moist. Cardiovascular: Normal rate, regular rhythm.  Respiratory: Normal respiratory effort without tachypnea nor retractions. Breath sounds are clear  Gastrointestinal: Soft and nontender. No distention.   Musculoskeletal: Patient has significant 3+ edema to the right lower extremity with discoloration, extremity is warm with a 2+ DP pulse and sensation intact and normal per patient.  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 Neurologic:  Normal speech and language. No gross focal neurologic deficits Skin:  Skin is warm, dry and intact.  Psychiatric: Mood and affect are normal.   ____________________________________________  RADIOLOGY  Ultrasound shows significant right lower extremity DVT  ____________________________________________   INITIAL IMPRESSION / ASSESSMENT AND PLAN / ED COURSE  Pertinent labs & imaging results that were available during my care of the patient were reviewed by me and considered in my medical decision making (see chart for details).   Patient presents emergency department for 2 days of right lower extremity discomfort now with significant swelling and discoloration.  Clinical exam consistent with phlegmasia.  We will obtain an ultrasound, lab work, we will start on a heparin infusion.  Patient will likely require admission to the hospital.  Patient states he has had a  prior thrombectomy/thrombolytics for his left lower extremity DVT approximately 1 year ago by Dr. Corene Cornea dew.  We will admit to the hospital service for significant DVT.  Vascular surgery has been consulted but is currently in the OR.  EKG viewed and interpreted by myself shows atrial fibrillation at 93 bpm with a narrow QRS, normal axis, largely normal intervals with nonspecific ST changes.  Nithin Demeo was evaluated in Emergency Department on 10/29/2019 for the symptoms described in the history of present illness. He was evaluated in the context of the global COVID-19 pandemic, which necessitated consideration that the patient might be at risk for infection with the SARS-CoV-2 virus that causes COVID-19. Institutional protocols and algorithms that pertain to the evaluation of patients at risk for COVID-19 are in a state of rapid change based on information released by regulatory bodies including the CDC and federal and state organizations. These policies and algorithms were followed during the patient's care in the ED.  CRITICAL CARE Performed by: Minna Antis   Total critical care time: 30 minutes  Critical care time was exclusive of separately billable procedures and treating other patients.  Critical care was necessary to treat or prevent imminent or life-threatening deterioration.  Critical care was time spent personally by me on the following activities: development of treatment plan with patient and/or surrogate as well as nursing, discussions with consultants, evaluation of patient's response to treatment, examination of patient, obtaining history from patient or surrogate, ordering and performing treatments and interventions, ordering and review of laboratory studies, ordering and review of radiographic studies, pulse oximetry and re-evaluation of patient's condition.    ____________________________________________   FINAL CLINICAL IMPRESSION(S) / ED  DIAGNOSES  Phlegmasia cerulea dolens   Minna Antis, MD 10/29/19 1044

## 2019-10-29 NOTE — ED Triage Notes (Addendum)
Patient from home via [facility]. Patient reports pan and swelling in right leg that started yesterday. Patient's leg red and swollen. Pedal pulses palpable. History of DVT in left leg. Pt on Xarelto. Denies SOB and chest pain.

## 2019-10-29 NOTE — H&P (Addendum)
History and Physical    Matthew [name] TOI:[id number] DOB: 1970/10/29 DOA: 10/29/2019  Referring MD/NP/PA:   PCP: Patient, No Pcp Per   Patient coming from:  The patient is coming from home.  At baseline, pt is independent for most of ADL.        Chief Complaint: right lower leg pain and swelling  HPI: Matthew [name] is a 49 y.o. male with medical history significant of prediabetes, left lower extremity DVT (not compliance to Xarelto), IVC filter, atrial fibrillation, tobacco abuse, OSA, who presents with right lower leg pain and swelling.   Pt has hx of left leg DVT and is supposed to take Xarelto, but the patient has not taken this medications for more than 2 weeks. He developed left leg swelling and pain, which has been progressively worsening.  Left leg is also erythematous.  Patient does not have chest pain, shortness breath.  No cough, fever or chills.  No nausea vomiting, diarrhea, abdominal pain, symptoms of UTI or unilateral weakness.  ED Course: pt was found to have WBC 10.2, INR 1.01, pending RVP for Covid test, electrolytes renal function okay, temperature normal, blood pressure 106/73, heart rate 78, RR 16, oxygen saturation 97% on room air. Pt is admitted to med-surg bed as inpt. Dr. Lucky Cowboy of [facility] is consulted by EDP.   LE doppler showed: 1. Examination is positive for extensive occlusive DVT involving the intirety of the right lower extremity venous system. 2. Given provided history of previous left lower extremity DVT, there is no evidence of acute or chronic DVT within the contralateral left common femoral vein.    Review of Systems:   General: no fevers, chills, no body weight gain, has fatigue HEENT: no blurry vision, hearing changes or sore throat Respiratory: no dyspnea, coughing, wheezing CV: no chest pain, no palpitations GI: no nausea, vomiting, abdominal pain, diarrhea, constipation GU: no dysuria, burning on urination, increased urinary frequency, hematuria    Ext: has right leg pain and swelling Neuro: no unilateral weakness, numbness, or tingling, no vision change or hearing loss Skin: no rash, no skin tear. MSK: No muscle spasm, no deformity, no limitation of range of movement in spin Heme: No easy bruising.  Travel history: No recent long distant travel.  Allergy: No Known Allergies  Past Medical History:  Diagnosis Date  . Arthritis   . Hx of blood clots   . Sleep apnea     Past Surgical History:  Procedure Laterality Date  . IVC FILTER INSERTION Left 11/24/2018   Procedure: IVC FILTER INSERTION;  Surgeon: Algernon Huxley, MD;  Location: [facility];  Service: Cardiovascular;  Laterality: Left;  . IVC FILTER INSERTION Right 02/18/2019   Procedure: IVC FILTER INSERTION WITH RIGHT LOWER EXTREMITY VENOUS LYSIS;  Surgeon: Katha Cabal, MD;  Location: [place] [facility];  Service: Cardiovascular;  Laterality: Right;  . IVC FILTER REMOVAL N/A 12/01/2018   Procedure: IVC FILTER REMOVAL;  Surgeon: Algernon Huxley, MD;  Location: [facility];  Service: Cardiovascular;  Laterality: N/A;  . NO PAST SURGERIES      Social History:  reports that he has been smoking cigarettes. He has been smoking about 0.50 packs per day. He has never used smokeless tobacco. He reports current alcohol use. He reports that he does not use drugs.  Family History: No family history on file.  Reviewed with patient, but patient states that all family members are healthy, no significant medical history.  Prior to Admission  medications   Medication Sig Start Date End Date Taking? Authorizing Provider  albuterol (ACCUNEB) 1.25 MG/3ML nebulizer solution Take 1 ampule by nebulization every 6 (six) hours as needed for wheezing.    [provider]  amoxicillin (AMOXIL) 875 MG tablet Take 1 tablet (875 mg total) by mouth 2 (two) times daily. Patient not taking: Reported on 10/29/2019 09/21/19   Joni Reining, PA-C  diltiazem (CARDIZEM CD) 120 MG  24 hr capsule Take 1 capsule (120 mg total) by mouth daily. 04/29/19 08/13/19  Iloabachie, Chioma E, NP  fexofenadine-pseudoephedrine (ALLEGRA-D) 60-120 MG 12 hr tablet Take 1 tablet by mouth 2 (two) times daily. 09/21/19   Joni Reining, PA-C  rivaroxaban (XARELTO) 20 MG TABS tablet Take 20 mg by mouth daily with supper.    [provider]    Physical Exam: Vitals:   10/29/19 0830 10/29/19 0832  BP:  106/73  Pulse:  78  Resp:  16  Temp:  98.8 F (37.1 C)  TempSrc:  Oral  SpO2:  97%  Weight: 117.9 kg   Height: 5\' 7"  (1.702 m)    General: Not in acute distress HEENT:       Eyes: PERRL, EOMI, no scleral icterus.       ENT: No discharge from the ears and nose, no pharynx injection, no tonsillar enlargement.        Neck: No JVD, no bruit, no mass felt. Heme: No neck lymph node enlargement. Cardiac: S1/S2, RRR, No murmurs, No gallops or rubs. Respiratory:  No rales, wheezing, rhonchi or rubs. GI: Soft, nondistended, nontender, no rebound pain, no organomegaly, BS present. GU: No hematuria Ext: 1+DP/PT pulse bilaterally. Has swelling, erythema and tenderness in right lower leg Musculoskeletal: No joint deformities, No joint redness or warmth, no limitation of ROM in spin. Skin: No rashes.  Neuro: Alert, oriented X3, cranial nerves II-XII grossly intact, moves all extremities. Psych: Patient is not psychotic, no suicidal or hemocidal ideation.  Labs on Admission: I have personally reviewed following labs and imaging studies  CBC: Recent Labs  Lab 10/29/19 0839  WBC 10.2  HGB 19.0*  HCT 56.9*  MCV 99.5  PLT 128*   Basic Metabolic Panel: Recent Labs  Lab 10/29/19 0839  NA 137  K 4.4  CL 98  CO2 30  GLUCOSE 114*  BUN 12  CREATININE 0.67  CALCIUM 8.9   GFR: Estimated Creatinine Clearance: 137.1 mL/min (by C-G formula based on SCr of 0.67 mg/dL). Liver Function Tests: Recent Labs  Lab 10/29/19 0839  AST 22  ALT 15  ALKPHOS 151*  BILITOT 1.4*  PROT  8.4*  ALBUMIN 3.5   No results for input(s): LIPASE, AMYLASE in the last 168 hours. No results for input(s): AMMONIA in the last 168 hours. Coagulation Profile: Recent Labs  Lab 10/29/19 0839  INR 1.1   Cardiac Enzymes: No results for input(s): CKTOTAL, CKMB, CKMBINDEX, TROPONINI in the last 168 hours. BNP (last 3 results) No results for input(s): PROBNP in the last 8760 hours. HbA1C: No results for input(s): HGBA1C in the last 72 hours. CBG: No results for input(s): GLUCAP in the last 168 hours. Lipid Profile: No results for input(s): CHOL, HDL, LDLCALC, TRIG, CHOLHDL, LDLDIRECT in the last 72 hours. Thyroid Function Tests: No results for input(s): TSH, T4TOTAL, FREET4, T3FREE, THYROIDAB in the last 72 hours. Anemia Panel: No results for input(s): VITAMINB12, FOLATE, FERRITIN, TIBC, IRON, RETICCTPCT in the last 72 hours. Urine analysis:    Component Value Date/Time  COLORURINE STRAW (A) 06/03/2019 1426   APPEARANCEUR CLEAR (A) 06/03/2019 1426   APPEARANCEUR Clear 02/05/2019 1251   LABSPEC 1.005 06/03/2019 1426   PHURINE 6.0 06/03/2019 1426   GLUCOSEU NEGATIVE 06/03/2019 1426   HGBUR NEGATIVE 06/03/2019 1426   BILIRUBINUR NEGATIVE 06/03/2019 1426   BILIRUBINUR Negative 02/05/2019 1251   KETONESUR NEGATIVE 06/03/2019 1426   PROTEINUR NEGATIVE 06/03/2019 1426   NITRITE NEGATIVE 06/03/2019 1426   LEUKOCYTESUR NEGATIVE 06/03/2019 1426   Sepsis Labs: @LABRCNTIP (procalcitonin:4,lacticidven:4) )No results found for this or any previous visit (from the past 240 hour(s)).   Radiological Exams on Admission: Venous Img Lower Unilateral Right  Result Date: 10/29/2019 CLINICAL DATA:  Right lower extremity pain and edema. History of previous left lower extremity DVT. Evaluate for right lower extremity DVT. EXAM: RIGHT LOWER EXTREMITY VENOUS DOPPLER ULTRASOUND TECHNIQUE: Gray-scale sonography with graded compression, as well as color Doppler and duplex ultrasound were performed  to evaluate the lower extremity deep venous systems from the level of the common femoral vein and including the common femoral, femoral, profunda femoral, popliteal and calf veins including the posterior tibial, peroneal and gastrocnemius veins when visible. The superficial great saphenous vein was also interrogated. Spectral Doppler was utilized to evaluate flow at rest and with distal augmentation maneuvers in the common femoral, femoral and popliteal veins. COMPARISON:  None. FINDINGS: Contralateral Common Femoral Vein: Respiratory phasicity is normal and symmetric with the symptomatic side. No evidence of acute or chronic thrombus. Normal compressibility. There is mixed echogenic occlusive DVT involving the right common femoral vein (image 8, extending to involve the saphenofemoral junction (image 12) as well as the imaged aspects of the right deep femoral vein (image 14). There is hypoechoic expansile occlusive thrombus involving the proximal (image 17), mid (image 20) and distal (image 24) aspects of the right femoral vein, extending to involve the proximal (image 26) and distal (image 29) aspects of the right popliteal vein. The tibial veins are not well imaged though likely also thrombosed. Other Findings:  None. IMPRESSION: 1. Examination is positive for extensive occlusive DVT involving the entirety of the right lower extremity venous system. 2. Given provided history of previous left lower extremity DVT, there is no evidence of acute or chronic DVT within the contralateral left common femoral vein. Electronically Signed   By: 10/31/2019 M.D.   On: 10/29/2019 09:38     EKG: Independently reviewed.  Atrial fibrillation, QTc 488, LAD, poor R wave progression  Assessment/Plan Principal Problem:   DVT of lower extremity (deep venous thrombosis) (HCC) Active Problems:   Atrial fibrillation (HCC)   DVT (deep venous thrombosis) (HCC)   Tobacco abuse   DVT of lower extremity (deep venous  thrombosis) (right leg): Lower extremity venous Doppler showed extensive occlusive DVT involving the intirety of the right lower extremity venous system. This is due to noncompliance to taking Xarelto. No CP or SOB, low suspicions for PE.  Dr. 10/31/2019 of [facility] was consulted by EDP.  -admit to med-surg bed as inpt -heparin drip initiated -Hypercoag panel -pain control: When necessary Percocet and morphine - f/u Dr. Wyn Quaker recommendations -keep pt NPO now in case pt needs procedure (waiting for Dr. Driscilla Grammes of [facility] to see)  Atrial fibrillation [facility]): pt is supposed to take Cardzem, but he is not taking this med. HR is 78.  -will hold off Cardizem -Cardiac monitoring now  Tobacco abuse -nicotine patch   Inpatient status:  # Patient requires inpatient status due to high intensity of service, high  risk for further deterioration and high frequency of surveillance required.  I certify that at the point of admission it is my clinical judgment that the patient will require inpatient hospital care spanning beyond 2 midnights from the point of admission.  . This patient has multiple chronic comorbidities including prediabetes, left lower extremity DVT (not compliance to Xarelto), IVC filter, atrial fibrillation, tobacco abuse, OSA . Now patient has presenting with extensive right leg DVT . The worrisome physical exam findings include erythema, swelling and tenderness in the right leg . The initial radiographic and laboratory data are worrisome because of findings of lower extremity venous Doppler with extensive DVT in the right leg. . Current medical needs: please see my assessment and plan . Predictability of an adverse outcome (risk): Patient has multiple comorbidities as listed above. . Now presents with extensive right leg DVT. Pt will likely need thrombolytic procedure. Patient's presentation is highly complicated.  Patient is at high risk of developing complications, such as pulmonary embolism. Will need to  be treated in hospital for at least 2 days.          DVT ppx: on IV Heparin Code Status: Full code Family Communication: not done, no family member is at bed side.   Disposition Plan:  Anticipate discharge back to previous home environment Consults called:  Dr. Wyn Quaker of [facility] Admission status: Med-surg bed as inpt   Date of Service 10/29/2019    Lorretta Harp [facility]   If 7PM-7AM, please contact night-coverage [email or web address] 10/29/2019, 10:36 AM

## 2019-10-29 NOTE — ED Notes (Signed)
Patient O2 dropping into 80s after given morphine. Patient placed on 2L [place]. Improved to 94%

## 2019-10-29 NOTE — Progress Notes (Signed)
ANTICOAGULATION CONSULT NOTE - Initial Consult  Pharmacy Consult for Heparin drip Indication: DVT  (supposed to be on Xarelto PTA but hasnt taken in 2 weeks)  No Known Allergies  Patient Measurements: Height: 5\' 7"  (170.2 cm) Weight: 260 lb (117.9 kg) IBW/kg (Calculated) : 66.1 Heparin Dosing Weight: 93.2 kg  Vital Signs: Temp: 98.8 F (37.1 C) (02/25 0832) Temp Source: Oral (02/25 0832) BP: 106/73 (02/25 0832) Pulse Rate: 78 (02/25 0832)  Labs: Recent Labs    10/29/19 0839  HGB 19.0*  HCT 56.9*  PLT 128*  APTT 29  LABPROT 14.0  INR 1.1  HEPARINUNFRC <0.10*  CREATININE 0.67    Estimated Creatinine Clearance: 137.1 mL/min (by C-G formula based on SCr of 0.67 mg/dL).   Medical History: Past Medical History:  Diagnosis Date  . Arthritis   . Hx of blood clots   . Sleep apnea     Medications:  Scheduled:   Infusions:  . sodium chloride    . heparin 1,400 Units/hr (10/29/19 10/31/19)    Assessment: 49 yo M w/ + DVT.  Pt has hx of DVT, on Xarelto PTA but states he has not taken in at least 2 weeks. Hgb 19.0  Plt 128  APTT 29  INR 1.1  HL <0.10  Goal of Therapy:  Heparin level 0.3-0.7 units/ml Monitor platelets by anticoagulation protocol: Yes   Plan:  Give 5500 units bolus x 1 Start heparin infusion at 1400 units/hr Check anti-Xa level in 6 hours and daily while on heparin Continue to monitor H&H and platelets  Note: did not order Heparin bolus until found out that patient has not been taking his Xarelto.   Matthew [name] A 10/29/2019,11:00 AM

## 2019-10-30 ENCOUNTER — Encounter
Admission: EM | Disposition: A | Discharge status: Home / Self Care | Payer: Self-pay | Source: Home / Self Care | Attending: Internal Medicine

## 2019-10-30 ENCOUNTER — Other Ambulatory Visit (INDEPENDENT_AMBULATORY_CARE_PROVIDER_SITE_OTHER): Payer: Self-pay | Admitting: Vascular Surgery

## 2019-10-30 DIAGNOSIS — I82411 Acute embolism and thrombosis of right femoral vein: Secondary | ICD-10-CM

## 2019-10-30 DIAGNOSIS — I82421 Acute embolism and thrombosis of right iliac vein: Secondary | ICD-10-CM

## 2019-10-30 HISTORY — PX: PERIPHERAL VASCULAR THROMBECTOMY: CATH118306

## 2019-10-30 LAB — BASIC METABOLIC PANEL
Anion gap: 8 (ref 5–15)
BUN: 16 mg/dL (ref 6–20)
CO2: 30 mmol/L (ref 22–32)
Calcium: 8.6 mg/dL — ABNORMAL LOW (ref 8.9–10.3)
Chloride: 96 mmol/L — ABNORMAL LOW (ref 98–111)
Creatinine, Ser: 0.89 mg/dL (ref 0.61–1.24)
GFR calc Af Amer: >60 mL/min (ref >60)
GFR calc non Af Amer: >60 mL/min (ref >60)
Glucose, Bld: 109 mg/dL — ABNORMAL HIGH (ref 70–99)
Potassium: 4.8 mmol/L (ref 3.5–5.1)
Sodium: 134 mmol/L — ABNORMAL LOW (ref 135–145)

## 2019-10-30 LAB — CBC
HCT: 53.7 % — ABNORMAL HIGH (ref 39.0–52.0)
Hemoglobin: 17.7 g/dL — ABNORMAL HIGH (ref 13.0–17.0)
MCH: 33.7 pg (ref 26.0–34.0)
MCHC: 33 g/dL (ref 30.0–36.0)
MCV: 102.3 fL — ABNORMAL HIGH (ref 80.0–100.0)
Platelets: 125 10*3/uL — ABNORMAL LOW (ref 150–400)
RBC: 5.25 MIL/uL (ref 4.22–5.81)
RDW: 14.4 % (ref 11.5–15.5)
WBC: 9.8 10*3/uL (ref 4.0–10.5)
nRBC: 0 % (ref 0.0–0.2)

## 2019-10-30 LAB — HEPARIN LEVEL (UNFRACTIONATED): Heparin Unfractionated: 0.48 IU/mL (ref 0.30–0.70)

## 2019-10-30 LAB — ANTITHROMBIN III: AntiThromb III Func: 79 % (ref 75–120)

## 2019-10-30 SURGERY — PERIPHERAL VASCULAR THROMBECTOMY
Anesthesia: Moderate Sedation | Laterality: Right

## 2019-10-30 MED ORDER — CEFAZOLIN SODIUM-DEXTROSE 2-4 GM/100ML-% IV SOLN
INTRAVENOUS | Status: AC
  Administered 2019-10-30: 15:00:00 2 g via INTRAVENOUS
  Filled 2019-10-30: qty 100

## 2019-10-30 MED ORDER — FENTANYL CITRATE (PF) 100 MCG/2ML IJ SOLN
INTRAMUSCULAR | Status: AC
  Filled 2019-10-30: qty 2

## 2019-10-30 MED ORDER — METHYLPREDNISOLONE SODIUM SUCC 125 MG IJ SOLR: 125.0000 mg | Freq: Once | INTRAMUSCULAR | Status: DC | PRN

## 2019-10-30 MED ORDER — CEFAZOLIN SODIUM-DEXTROSE 2-4 GM/100ML-% IV SOLN
2.0000 g | Freq: Once | INTRAVENOUS | Status: AC
  Filled 2019-10-30: qty 100

## 2019-10-30 MED ORDER — HEPARIN SODIUM (PORCINE) 1000 UNIT/ML IJ SOLN
INTRAMUSCULAR | Status: DC | PRN
  Administered 2019-10-30: 3000 [IU] via INTRAVENOUS

## 2019-10-30 MED ORDER — MIDAZOLAM HCL 2 MG/ML PO SYRP: 8.0000 mg | ORAL_SOLUTION | Freq: Once | ORAL | Status: DC | PRN

## 2019-10-30 MED ORDER — HEPARIN BOLUS VIA INFUSION
2600.0000 [IU] | Freq: Once | INTRAVENOUS | Status: AC
  Administered 2019-10-30: 2600 [IU] via INTRAVENOUS
  Filled 2019-10-30: qty 2600

## 2019-10-30 MED ORDER — MIDAZOLAM HCL 2 MG/ML PO SYRP
8.0000 mg | ORAL_SOLUTION | Freq: Once | ORAL | Status: DC | PRN
  Filled 2019-10-30: qty 4

## 2019-10-30 MED ORDER — DIPHENHYDRAMINE HCL 50 MG/ML IJ SOLN: 50.0000 mg | Freq: Once | INTRAMUSCULAR | Status: DC | PRN

## 2019-10-30 MED ORDER — RIVAROXABAN 20 MG PO TABS: 20.0000 mg | ORAL_TABLET | Freq: Every day | ORAL | Status: DC

## 2019-10-30 MED ORDER — RIVAROXABAN 20 MG PO TABS: 20.0000 mg | ORAL_TABLET | Freq: Every day | ORAL | 2 refills | Status: DC

## 2019-10-30 MED ORDER — RIVAROXABAN 15 MG PO TABS
15.0000 mg | ORAL_TABLET | Freq: Two times a day (BID) | ORAL | Status: DC
  Administered 2019-10-30: 15 mg via ORAL
  Filled 2019-10-30 (×2): qty 1

## 2019-10-30 MED ORDER — SODIUM CHLORIDE 0.9 % IV SOLN: INTRAVENOUS | Status: DC

## 2019-10-30 MED ORDER — RIVAROXABAN 15 MG PO TABS: 15.0000 mg | ORAL_TABLET | Freq: Two times a day (BID) | ORAL | 0 refills | Status: DC

## 2019-10-30 MED ORDER — HEPARIN SODIUM (PORCINE) 1000 UNIT/ML IJ SOLN
INTRAMUSCULAR | Status: AC
  Filled 2019-10-30: qty 1

## 2019-10-30 MED ORDER — HYDROMORPHONE HCL 1 MG/ML IJ SOLN: 1.0000 mg | Freq: Once | INTRAMUSCULAR | Status: DC | PRN

## 2019-10-30 MED ORDER — MIDAZOLAM HCL 5 MG/5ML IJ SOLN
INTRAMUSCULAR | Status: AC
  Filled 2019-10-30: qty 5

## 2019-10-30 MED ORDER — ONDANSETRON HCL 4 MG/2ML IJ SOLN: 4.0000 mg | Freq: Four times a day (QID) | INTRAMUSCULAR | Status: DC | PRN

## 2019-10-30 MED ORDER — CEFAZOLIN SODIUM-DEXTROSE 2-4 GM/100ML-% IV SOLN: 2.0000 g | Freq: Once | INTRAVENOUS | Status: DC

## 2019-10-30 MED ORDER — FAMOTIDINE 20 MG PO TABS: 40.0000 mg | ORAL_TABLET | Freq: Once | ORAL | Status: DC | PRN

## 2019-10-30 MED ORDER — ALTEPLASE 2 MG IJ SOLR
INTRAMUSCULAR | Status: AC
  Filled 2019-10-30: qty 6

## 2019-10-30 MED ORDER — MIDAZOLAM HCL 2 MG/2ML IJ SOLN
INTRAMUSCULAR | Status: DC | PRN
  Administered 2019-10-30: 2 mg via INTRAVENOUS

## 2019-10-30 MED ORDER — FENTANYL CITRATE (PF) 100 MCG/2ML IJ SOLN
INTRAMUSCULAR | Status: DC | PRN
  Administered 2019-10-30: 50 ug via INTRAVENOUS

## 2019-10-30 MED ORDER — IODIXANOL 320 MG/ML IV SOLN
INTRAVENOUS | Status: DC | PRN
  Administered 2019-10-30: 35 mL via INTRA_ARTERIAL

## 2019-10-30 SURGICAL SUPPLY — 9 items
CANISTER PENUMBRA ENGINE (MISCELLANEOUS) ×2 IMPLANT
CANNULA 5F STIFF (CANNULA) ×2 IMPLANT
CATH BEACON 5 .035 65 KMP TIP (CATHETERS) ×2 IMPLANT
CATH INDIGO 12XTORQ 100 (CATHETERS) ×2 IMPLANT
CATH INDIGO SEP 12 (CATHETERS) ×2 IMPLANT
PACK ANGIOGRAPHY (CUSTOM PROCEDURE TRAY) ×3 IMPLANT
SHEATH PINNACLE 11FRX10 (SHEATH) ×2 IMPLANT
WIRE J 3MM .035X145CM (WIRE) ×2 IMPLANT
WIRE MAGIC TORQUE 260C (WIRE) ×2 IMPLANT

## 2019-10-30 NOTE — Progress Notes (Signed)
Matthew [name] to be D/C'd Home per MD order.  Discussed prescriptions and follow up appointments with the patient. Prescriptions given to patient, medication list explained in detail. Pt verbalized understanding.  Allergies as of 10/30/2019   No Known Allergies     Medication List    STOP taking these medications   amoxicillin 875 MG tablet Commonly known as: AMOXIL   diltiazem 120 MG 24 hr capsule Commonly known as: CARDIZEM CD     TAKE these medications   albuterol 1.25 MG/3ML nebulizer solution Commonly known as: ACCUNEB Take 1 ampule by nebulization every 6 (six) hours as needed for wheezing.   fexofenadine-pseudoephedrine 60-120 MG 12 hr tablet Commonly known as: ALLEGRA-D Take 1 tablet by mouth 2 (two) times daily.   Rivaroxaban 15 MG Tabs tablet Commonly known as: XARELTO Take 1 tablet (15 mg total) by mouth 2 (two) times daily. What changed:   medication strength  how much to take  when to take this   rivaroxaban 20 MG Tabs tablet Commonly known as: XARELTO Take 1 tablet (20 mg total) by mouth daily. Start taking on: November 20, 2019 What changed: You were already taking a medication with the same name, and this prescription was added. Make sure you understand how and when to take each.       Vitals:   10/30/19 1558 10/30/19 1650  BP: 90/63 90/60  Pulse: 76 92  Resp:  18  Temp:  98.2 F (36.8 C)  SpO2: 92% 92%    Skin clean, dry and intact without evidence of skin break down, no evidence of skin tears noted. IV catheter discontinued intact. Site without signs and symptoms of complications. Dressing and pressure applied. Pt denies pain at this time. No complaints noted.  An After Visit Summary was printed and given to the patient. Patient escorted via WC, and D/C home via private auto.  Madie Reno, RN

## 2019-10-30 NOTE — Progress Notes (Signed)
ANTICOAGULATION CONSULT NOTE   Pharmacy Consult for Heparin drip Indication: DVT  (supposed to be on Xarelto PTA but hasnt taken in 2 weeks)  No Known Allergies  Patient Measurements: Height: 5\' 7"  (170.2 cm) Weight: 260 lb (117.9 kg) IBW/kg (Calculated) : 66.1 Heparin Dosing Weight: 93.2 kg  Vital Signs: Temp: 98 F (36.7 C) (02/26 0358) Temp Source: Oral (02/26 0358) BP: 107/75 (02/26 0358) Pulse Rate: 86 (02/26 0413)  Labs: Recent Labs    10/29/19 0839 10/29/19 0839 10/29/19 1645 10/29/19 2312 10/30/19 0716  HGB 19.0*  --   --   --  17.7*  HCT 56.9*  --   --   --  53.7*  PLT 128*  --   --   --  125*  APTT 29  --   --   --   --   LABPROT 14.0  --   --   --   --   INR 1.1  --   --   --   --   HEPARINUNFRC <0.10*   < > 0.49 0.27* 0.48  CREATININE 0.67  --   --   --  0.89   < > = values in this interval not displayed.    Estimated Creatinine Clearance: 123.3 mL/min (by C-G formula based on SCr of 0.89 mg/dL).   Medical History: Past Medical History:  Diagnosis Date  . Arthritis   . Hx of blood clots   . Sleep apnea    patient states not anymore    Medications:  Scheduled:  . influenza vac split quadrivalent PF  0.5 mL Intramuscular Tomorrow-1000  . nicotine  21 mg Transdermal Daily  . pneumococcal 23 valent vaccine  0.5 mL Intramuscular Tomorrow-1000   Infusions:  . sodium chloride 75 mL/hr at 10/30/19 0005  . sodium chloride    .  ceFAZolin (ANCEF) IV    . heparin 1,550 Units/hr (10/30/19 0116)    Assessment: 49 yo M w/ + DVT.  Pt has hx of DVT, on Xarelto PTA but states he has not taken in at least 2 weeks. Hgb 19.0  Plt 128  APTT 29  INR 1.1  HL <0.10  2/25 1645 HL 0.49  02/25 @ 2300 HL 0.27 subtherapeutic. Will rebolus heparin 2600 units IV x 1 and increase rate to 1550 units/hr  Goal of Therapy:  Heparin level 0.3-0.7 units/ml Monitor platelets by anticoagulation protocol: Yes   Plan:  02/26 @ 0716 HL 0.48 therapeutic. Will continue rate  at 1550 units/hr and will check confirmatory level in 6 hours, will continue to monitor.  Aashka Salomone A, PharmD 10/30/2019,8:35 AM

## 2019-10-30 NOTE — Discharge Summary (Signed)
[facility] [place] [facility]   PATIENT NAME: Matthew [name]    MR#:  [id number]  DATE OF BIRTH:  15-Dec-1970  DATE OF ADMISSION:  10/29/2019 ADMITTING PHYSICIAN: Lorretta Harp, MD  DATE OF DISCHARGE: 10/30/2019  PRIMARY CARE PHYSICIAN: Patient, No Pcp Per    ADMISSION DIAGNOSIS:  DVT (deep venous thrombosis) (HCC) [I82.409] Acute deep vein thrombosis (DVT) of proximal vein of right lower extremity (HCC) [I82.4Y1]  DISCHARGE DIAGNOSIS:  Acute deep vein thrombosis of right lower extremity status post thrombectomy/thrombolysis by Dr Wyn Quaker Non compliance to Xarelto  SECONDARY DIAGNOSIS:   Past Medical History:  Diagnosis Date  . Arthritis   . Hx of blood clots   . Sleep apnea    patient states not anymore    HOSPITAL COURSE:   Matthew [name] is a 49 y.o. 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 This is due to noncompliance to taking Xarelto. No CP or SOB, low suspicions for PE.  -Vascular surgery consult with Dr. Wyn Quaker  -s/p Catheter directed thrombolysis with 6 mg of TPA to the right superficial femoral vein, common femoral vein, and iliac veins, Mechanical thrombectomy to right superficial femoral vein, common femoral vein, and iliac veins with the penumbra cat 12 device -Patent IVC filter (from the past) -heparin drip initiated--change to po xarelto -pain control: When necessary Percocet and morphine -patient desires to go home. Okay from vascular standpoint to go  home and follow-up as outpatient.  H/o Atrial fibrillation (HCC): - pt is supposed to take Cardzem, but he is not taking this med. HR is 78.  -will hold off Cardizem -bp soft  Tobacco abuse -nicotine patch  Pt will d/c to home. Rx sent to [facility] CONSULTS OBTAINED:    DRUG ALLERGIES:  No Known Allergies  DISCHARGE MEDICATIONS:   Allergies as of 10/30/2019   No Known Allergies     Medication List    STOP taking these medications   amoxicillin 875 MG tablet Commonly known as: AMOXIL   diltiazem 120 MG 24 hr capsule Commonly known as: CARDIZEM CD     TAKE these medications   albuterol 1.25 MG/3ML nebulizer solution Commonly known as: ACCUNEB Take 1 ampule by nebulization every 6 (six) hours as needed for wheezing.   fexofenadine-pseudoephedrine 60-120 MG 12 hr tablet Commonly known as: ALLEGRA-D Take 1 tablet by mouth 2 (two) times daily.   Rivaroxaban 15 MG Tabs tablet Commonly known as: XARELTO Take 1 tablet (15 mg total) by mouth 2 (two) times daily. What changed:   medication strength  how much to take  when to take this   rivaroxaban 20 MG Tabs tablet Commonly known as: XARELTO Take 1 tablet (20 mg total) by mouth daily. Start taking on: November 20, 2019 What changed: You were already taking a medication with the same name, and this prescription was added. Make sure you understand how and when to take each.       If you experience worsening of your admission symptoms, develop shortness of breath, life threatening emergency,  suicidal or homicidal thoughts you must seek medical attention immediately by calling 911 or calling your MD immediately  if symptoms less severe.  You Must read complete instructions/literature along with all the possible adverse reactions/side effects for all the Medicines you take and that have been prescribed to you. Take any new Medicines after you have completely understood and accept all the possible adverse reactions/side  effects.   Please note  You were cared for by a hospitalist during your hospital stay. If you have any questions about your discharge medications or the care you received while you were in the hospital after you are discharged, you can call the unit and asked to speak with the hospitalist on call if the hospitalist that took care of you is not available. Once you are discharged, your primary care physician will handle any further medical issues. Please note that NO REFILLS for any discharge medications will be authorized once you are discharged, as it is imperative that you return to your primary care physician (or establish a relationship with a primary care physician if you do not have one) for your aftercare needs so that they can reassess your need for medications and monitor your lab values. Today   SUBJECTIVE   Seen earlier--denies much complaints. Focus was to go home afte rhte procedure Admits to missing xarelto for 2 weeks  VITAL SIGNS:  Blood pressure 90/63, pulse 76, temperature 98.3 F (36.8 C), temperature source Oral, resp. rate 18, height 5\' 7"  (1.702 m), weight 118 kg, SpO2 92 %.  I/O:    Intake/Output Summary (Last 24 hours) at 10/30/2019 1605 Last data filed at 10/30/2019 1249 Gross per 24 hour  Intake 1373.52 ml  Output 700 ml  Net 673.52 ml    PHYSICAL EXAMINATION:  GENERAL:  49 y.o.-year-old patient lying in the bed with no acute distress.  EYES: Pupils equal, round, reactive to light and accommodation. No scleral icterus.  HEENT: Head atraumatic, normocephalic. Oropharynx and nasopharynx clear.  NECK:  Supple, no jugular venous distention. No thyroid enlargement, no tenderness.  LUNGS: Normal breath sounds bilaterally, no wheezing, rales,rhonchi or crepitation. No use of accessory muscles of respiration.  CARDIOVASCULAR: S1, S2 normal. No murmurs, rubs, or gallops.  ABDOMEN: Soft, non-tender, non-distended. Bowel sounds present. No organomegaly or mass.   EXTREMITIES: No pedal edema, cyanosis, or clubbing. Right leg swelling + NEUROLOGIC: Cranial nerves II through XII are intact. Muscle strength 5/5 in all extremities. Sensation intact. Gait not checked.  PSYCHIATRIC: The patient is alert and oriented x 3.  SKIN: No obvious rash, lesion, or ulcer.   DATA REVIEW:   CBC  Recent Labs  Lab 10/30/19 0716  WBC 9.8  HGB 17.7*  HCT 53.7*  PLT 125*    Chemistries  Recent Labs  Lab 10/29/19 0839 10/29/19 0839 10/30/19 0716  NA 137   < > 134*  K 4.4   < > 4.8  CL 98   < > 96*  CO2 30   < > 30  GLUCOSE 114*   < > 109*  BUN 12   < > 16  CREATININE 0.67   < > 0.89  CALCIUM 8.9   < > 8.6*  AST 22  --   --   ALT 15  --   --   ALKPHOS 151*  --   --   BILITOT 1.4*  --   --    < > = values in this interval not displayed.    Microbiology Results  Recent Results (from the past 240 hour(s))  Respiratory Panel by RT PCR (Flu A&B, Covid) - Nasopharyngeal Swab     Status: None   Collection Time: 10/29/19 10:09 AM   Specimen: Nasopharyngeal Swab  Result Value Ref Range Status   SARS Coronavirus 2 by RT PCR NEGATIVE NEGATIVE Final    Comment: (NOTE) SARS-CoV-2 target nucleic acids are NOT DETECTED. The SARS-CoV-2 RNA is generally detectable in upper respiratoy specimens during the acute phase of infection. The lowest concentration of SARS-CoV-2 viral copies this assay can detect is 131 copies/mL. A negative result does not preclude SARS-Cov-2 infection and should not be used as the sole basis for treatment or other patient management decisions. A negative result may occur with  improper specimen collection/handling, submission of specimen other than nasopharyngeal swab, presence of viral mutation(s) within the areas targeted by this assay, and inadequate number of viral copies (<131 copies/mL). A negative result must be combined with clinical observations, patient history, and epidemiological information. The expected result is  Negative. Fact Sheet for Patients:  https://www.moore.com/ Fact Sheet for Healthcare Providers:  https://www.young.biz/ This test is not yet ap proved or cleared by the [place] FDA and  has been authorized for detection and/or diagnosis of SARS-CoV-2 by FDA under an Emergency Use Authorization (EUA). This EUA will remain  in effect (meaning this test can be used) for the duration of the COVID-19 declaration under Section 564(b)(1) of the Act, 21 U.S.C. section 360bbb-3(b)(1), unless the authorization is terminated or revoked sooner.    Influenza A by PCR NEGATIVE NEGATIVE Final   Influenza B by PCR NEGATIVE NEGATIVE Final    Comment: (NOTE) The Xpert Xpress SARS-CoV-2/FLU/RSV assay is intended as an aid in  the diagnosis of influenza from Nasopharyngeal swab specimens and  should not be used as a sole basis for treatment. Nasal washings and  aspirates are unacceptable for Xpert Xpress SARS-CoV-2/FLU/RSV  testing. Fact Sheet for Patients: https://www.moore.com/ Fact Sheet for Healthcare Providers: https://www.young.biz/ This test is not yet approved or cleared by the [place] FDA and  has been authorized for detection and/or diagnosis of SARS-CoV-2 by  FDA under an Emergency Use Authorization (EUA). This EUA will remain  in effect (meaning this test can be used) for the duration of the  Covid-19 declaration under Section 564(b)(1) of the Act, 21  U.S.C. section 360bbb-3(b)(1), unless the authorization is  terminated or revoked. Performed at [facility], [address] [place], [place] [address]     RADIOLOGY:  PERIPHERAL VASCULAR CATHETERIZATION  Result Date: 10/30/2019 See op note  US Venous Img Lower Unilateral Right  Result Date: 10/29/2019 CLINICAL DATA:  Right lower extremity pain and edema. History of previous left lower extremity DVT. Evaluate for right lower extremity  DVT. EXAM: RIGHT LOWER EXTREMITY VENOUS DOPPLER ULTRASOUND TECHNIQUE: Gray-scale sonography with graded compression, as well as color Doppler and duplex ultrasound were performed to evaluate the lower extremity deep venous systems from the level of the common femoral vein and including the common femoral, femoral, profunda femoral, popliteal and calf veins including the posterior tibial, peroneal and gastrocnemius veins when visible. The superficial great saphenous vein was also interrogated. Spectral Doppler was utilized to evaluate flow at rest and with distal augmentation maneuvers in the common femoral, femoral and popliteal veins. COMPARISON:  None. FINDINGS: Contralateral Common Femoral Vein: Respiratory phasicity is normal and symmetric with the symptomatic side. No evidence of acute or chronic thrombus. Normal compressibility. There is mixed echogenic occlusive DVT involving  the right common femoral vein (image 8, extending to involve the saphenofemoral junction (image 12) as well as the imaged aspects of the right deep femoral vein (image 14). There is hypoechoic expansile occlusive thrombus involving the proximal (image 17), mid (image 20) and distal (image 24) aspects of the right femoral vein, extending to involve the proximal (image 26) and distal (image 29) aspects of the right popliteal vein. The tibial veins are not well imaged though likely also thrombosed. Other Findings:  None. IMPRESSION: 1. Examination is positive for extensive occlusive DVT involving the entirety of the right lower extremity venous system. 2. Given provided history of previous left lower extremity DVT, there is no evidence of acute or chronic DVT within the contralateral left common femoral vein. Electronically Signed   By: Simonne Come M.D.   On: 10/29/2019 09:38     CODE STATUS:     Code Status Orders  (From admission, onward)         Start     Ordered   10/29/19 1240  Full code  Continuous     10/29/19 1239         Code Status History    Date Active Date Inactive Code Status Order ID Comments User Context   03/11/2019 0056 03/14/2019 2005 Full Code [id number]  Pearletha Alfred, NP ED   02/17/2019 2013 02/19/2019 1555 Full Code [id number]  Enedina Finner, MD ED   11/19/2018 2234 11/20/2018 1643 Full Code [id number]  Oralia Manis, MD Inpatient   Advance Care Planning Activity       TOTAL TIME TAKING CARE OF THIS PATIENT: *35* minutes.    Enedina Finner M.D  [facility]    CC: Primary care physician; Patient, No Pcp Per

## 2019-10-30 NOTE — Progress Notes (Signed)
ANTICOAGULATION CONSULT NOTE - Initial Consult  Pharmacy Consult for Heparin drip Indication: DVT  (supposed to be on Xarelto PTA but hasnt taken in 2 weeks)  No Known Allergies  Patient Measurements: Height: 5\' 7"  (170.2 cm) Weight: 260 lb (117.9 kg) IBW/kg (Calculated) : 66.1 Heparin Dosing Weight: 93.2 kg  Vital Signs: Temp: 98.8 F (37.1 C) (02/25 2259) Temp Source: Oral (02/25 2259) BP: 86/70 (02/25 2259) Pulse Rate: 89 (02/25 2259)  Labs: Recent Labs    10/29/19 0839 10/29/19 1645 10/29/19 2312  HGB 19.0*  --   --   HCT 56.9*  --   --   PLT 128*  --   --   APTT 29  --   --   LABPROT 14.0  --   --   INR 1.1  --   --   HEPARINUNFRC <0.10* 0.49 0.27*  CREATININE 0.67  --   --     Estimated Creatinine Clearance: 137.1 mL/min (by C-G formula based on SCr of 0.67 mg/dL).   Medical History: Past Medical History:  Diagnosis Date  . Arthritis   . Hx of blood clots   . Sleep apnea    patient states not anymore    Medications:  Scheduled:  . heparin  2,600 Units Intravenous Once  . influenza vac split quadrivalent PF  0.5 mL Intramuscular Tomorrow-1000  . nicotine  21 mg Transdermal Daily  . pneumococcal 23 valent vaccine  0.5 mL Intramuscular Tomorrow-1000   Infusions:  . sodium chloride 75 mL/hr at 10/30/19 0005  . heparin 1,400 Units/hr (10/30/19 0038)    Assessment: 49 yo M w/ + DVT.  Pt has hx of DVT, on Xarelto PTA but states he has not taken in at least 2 weeks. Hgb 19.0  Plt 128  APTT 29  INR 1.1  HL <0.10  2/25 1645 HL 0.49   Goal of Therapy:  Heparin level 0.3-0.7 units/ml Monitor platelets by anticoagulation protocol: Yes   Plan:  02/25 @ 2300 HL 0.27 subtherapeutic. Will rebolus heparin 2600 units IV x 1 and increase rate to 1550 units/hr and will recheck HL at 0700, will continue to monitor.   3/25, PharmD, BCPS 10/30/2019,1:08 AM

## 2019-10-30 NOTE — Discharge Instructions (Signed)
Patient advised to be compliant with his blood pressure medicine  to avoid recurrent DVT

## 2019-10-30 NOTE — H&P (Signed)
[facility] History & Physical Update  The patient was interviewed and re-examined.  The patient's previous History and Physical has been reviewed and is unchanged.  There is no change in the plan of care. We plan to proceed with the scheduled procedure.  Festus Barren, MD  10/30/2019, 1:51 PM

## 2019-10-30 NOTE — Progress Notes (Signed)
ANTICOAGULATION CONSULT NOTE   Pharmacy Consult for Xarelto Indication: DVT    No Known Allergies  Patient Measurements: Height: 5\' 7"  (170.2 cm) Weight: 260 lb (117.9 kg) IBW/kg (Calculated) : 66.1 Heparin Dosing Weight: 93.2 kg  Vital Signs: Temp: 98.8 F (37.1 C) (02/26 1237) Temp Source: Oral (02/26 1237) BP: 99/63 (02/26 1237) Pulse Rate: 83 (02/26 1237)  Labs: Recent Labs    10/29/19 0839 10/29/19 0839 10/29/19 1645 10/29/19 2312 10/30/19 0716  HGB 19.0*  --   --   --  17.7*  HCT 56.9*  --   --   --  53.7*  PLT 128*  --   --   --  125*  APTT 29  --   --   --   --   LABPROT 14.0  --   --   --   --   INR 1.1  --   --   --   --   HEPARINUNFRC <0.10*   < > 0.49 0.27* 0.48  CREATININE 0.67  --   --   --  0.89   < > = values in this interval not displayed.    Estimated Creatinine Clearance: 123.3 mL/min (by C-G formula based on SCr of 0.89 mg/dL).   Medical History: Past Medical History:  Diagnosis Date  . Arthritis   . Hx of blood clots   . Sleep apnea    patient states not anymore    Medications:  Scheduled:  . influenza vac split quadrivalent PF  0.5 mL Intramuscular Tomorrow-1000  . nicotine  21 mg Transdermal Daily  . pneumococcal 23 valent vaccine  0.5 mL Intramuscular Tomorrow-1000   Infusions:  . sodium chloride Stopped (10/30/19 1104)  . sodium chloride 75 mL/hr at 10/30/19 1249  . ceFAZolin    .  ceFAZolin (ANCEF) IV    . heparin 1,550 Units/hr (10/30/19 1249)    Assessment: 49 yo M w/ + DVT.  Pt has hx of DVT, on Xarelto PTA but states he has not taken in at least 2 weeks. He was started on a heparin infusion up until now and  Is preparing for a right lower extremity venous thrombectomy / thrombolysis today with vascular surgery. He will start Xarelto after the procedure at the full treatment dose.  Venous duplex on 10/29/19: 1. Examination is positive for extensive occlusive DVT involving the entirety of the right lower extremity venous  system. 2. Given provided history of previous left lower extremity DVT, there is no evidence of acute or chronic DVT within the contralateral left common femoral vein.  Goal of Therapy:  Monitor platelets by anticoagulation protocol: Yes   Plan:   Heparin has been held for thrombectomy / thrombolysis today with vascular surgery  After returning from the procedure begin rivaroxaban 15 mg twice daily with food for 21 days followed by 20 mg once daily with food  Barring complications the patient is scheduled to be discharged later today  If patient is not discharged CBC in am: order in place  10/31/19, PharmD 10/30/2019,1:37 PM

## 2019-10-30 NOTE — Progress Notes (Signed)
Nutrition Brief Note  Patient identified on the Malnutrition Screening Tool (MST) Report  49 y.o. male with medical history significant of prediabetes, left lower extremity DVT (not compliance to Xarelto), IVC filter, atrial fibrillation, tobacco abuse, OSA, who presents with right lower leg pain and swelling.  Pt screened on MST report for 3/4 weight loss. Pt reports intentional weight loss. Pt has been trying to go outside more and not cook as much. Per chart, pt has lost 30lbs(10%) over the past year which is not significant. Pt currently NPO for procedure today but reports that his appetite has been good at home.   Wt Readings from Last 15 Encounters:  10/29/19 117.9 kg  10/02/19 120.2 kg  09/21/19 129.7 kg  08/13/19 129.7 kg  07/16/19 123.8 kg  06/27/19 136.1 kg  06/03/19 122.5 kg  04/23/19 123.8 kg  04/02/19 127 kg  03/11/19 127.2 kg  03/04/19 130 kg  02/18/19 131.6 kg  12/01/18 (!) 136.5 kg  11/24/18 (!) 136.5 kg  11/19/18 (!) 136.5 kg    Body mass index is 40.72 kg/m. Patient meets criteria for morbid obesity based on current BMI.    Labs and medications reviewed. Pt to discharge after procedure today.   No nutrition interventions warranted at this time. If nutrition issues arise, please consult RD.   Betsey Holiday MS, RD, LDN Contact information available in Amion

## 2019-10-30 NOTE — Op Note (Signed)
[facility]   OPERATIVE NOTE   PRE-OPERATIVE DIAGNOSIS: extensive RLE DVT  POST-OPERATIVE DIAGNOSIS: same   PROCEDURE: 1. US guidance for vascular access to right popliteal vein 2. Catheter placement into right common iliac vein from right popliteal approach 3. IVC gram and right lower extremity venogram 4.   Catheter directed thrombolysis with 6 mg of TPA to the right superficial femoral vein, common femoral vein, and iliac veins 5. Mechanical thrombectomy to right superficial femoral vein, common femoral vein, and iliac veins with the penumbra cat 12 device    SURGEON: Leotis Pain, MD  ASSISTANT(S): none  ANESTHESIA: local with moderate conscious sedation for 25 minutes using 2 mg of Versed and 50 mcg of Fentanyl  ESTIMATED BLOOD LOSS: 300 cc  FINDING(S): 1. Extensive thrombus from the mid to proximal superficial femoral vein through the common femoral vein and throughout both the external and common iliac veins.  The IVC filter was patent and there was a previous IVC filter in place.  SPECIMEN(S): none  INDICATIONS:  Patient is a 49 y.o. male who presents with pain and swelling in the right leg with an extensive right lower extremity DVT. Patient has marked leg swelling and pain. Venous intervention is performed to reduce the symtpoms and avoid long term postphlebitic symptoms.   DESCRIPTION: After obtaining full informed written consent, the patient was brought back to the vascular suite and placed supine upon the table.Moderate conscious sedation was administered during a face to face encounter with the patient throughout the procedure with my supervision of the RN administering medicines and monitoring the patient's vital signs, pulse oximetry, telemetry and mental status throughout from the start of the procedure until the patient was taken to the recovery room. After obtaining adequate anesthesia, the patient was prepped and draped in  the standard fashion.  The patient was then placed into the prone position. The right popliteal vein was then accessed under direct ultrasound guidance without difficulty with a micropuncture needle and a permanent image was recorded. I then upsized to an 11Fr sheath over a J wire. 3000 units of heparin were then given. Imaging showed extensive DVT with minimal flow starting in the mid to proximal superficial femoral vein and extending through the common femoral vein and the iliac veins. A Kumpe catheter and Magic tourque wire were then advanced into the CFV and images were performed. There was extensive thrombus within the common femoral vein and the iliac veins.  I was able to cross the thrombus and stenosis and advance into the proximal right common iliac vein and image the IVC which was patent.  There was a previous IVC filter in place with good flow through the IVC. I then used the Kumpe catheter and instilled 6 mg of tpa throughout the iliac veins, common femoral vein, and superficial femoral vein.  After this dwelled, I used the Penumbra Cat 12 catheter and evacuated about 300 cc of effluent with mechanical thrombectomy throughout the iliac veins, CFV, and SFV. Several passes were made and large chunks of thrombus were removed with each pass.  Imaging was performed between passes showing continued improvement until the final image showed only a small amount of residual thrombus in the common femoral vein, no significant residual thrombus within the superficial femoral vein, and no significant residual thrombus within the iliac veins.  The IVC and the IVC filter also remain patent.  I then elected to terminate the procedure. The sheath was removed and a dressing was placed.  She was taken to the recovery room in stable condition having tolerated the procedure well.   COMPLICATIONS: None  CONDITION: Stable  Festus Barren 10/30/2019 3:03 PM

## 2019-10-31 LAB — HOMOCYSTEINE: Homocysteine: 12.6 umol/L (ref 0.0–14.5)

## 2019-10-31 LAB — CARDIOLIPIN ANTIBODIES, IGG, IGM, IGA
Anticardiolipin IgA: <9 APL U/mL (ref 0–11)
Anticardiolipin IgG: <9 GPL U/mL (ref 0–14)
Anticardiolipin IgM: <9 MPL U/mL (ref 0–12)

## 2019-11-01 LAB — PROTEIN S ACTIVITY: Protein S Activity: 76 % (ref 63–140)

## 2019-11-01 LAB — PROTEIN C, TOTAL: Protein C, Total: 84 % (ref 60–150)

## 2019-11-01 LAB — PROTEIN C ACTIVITY: Protein C Activity: 93 % (ref 73–180)

## 2019-11-01 LAB — PROTEIN S, TOTAL: Protein S Ag, Total: 78 % (ref 60–150)

## 2019-11-02 ENCOUNTER — Other Ambulatory Visit: Payer: Self-pay

## 2019-11-02 ENCOUNTER — Emergency Department: Payer: Self-pay

## 2019-11-02 ENCOUNTER — Inpatient Hospital Stay
Admission: EM | Admit: 2019-11-02 | Discharge: 2019-11-06 | DRG: 271 | Disposition: A | Discharge status: Home / Self Care | Payer: Self-pay | Attending: Internal Medicine | Admitting: Internal Medicine

## 2019-11-02 ENCOUNTER — Encounter: Payer: Self-pay | Admitting: Cardiology

## 2019-11-02 DIAGNOSIS — I82421 Acute embolism and thrombosis of right iliac vein: Secondary | ICD-10-CM | POA: Diagnosis present

## 2019-11-02 DIAGNOSIS — Z9114 Patient's other noncompliance with medication regimen: Secondary | ICD-10-CM

## 2019-11-02 DIAGNOSIS — R062 Wheezing: Secondary | ICD-10-CM

## 2019-11-02 DIAGNOSIS — I82411 Acute embolism and thrombosis of right femoral vein: Principal | ICD-10-CM

## 2019-11-02 DIAGNOSIS — D696 Thrombocytopenia, unspecified: Secondary | ICD-10-CM | POA: Diagnosis present

## 2019-11-02 DIAGNOSIS — R7301 Impaired fasting glucose: Secondary | ICD-10-CM | POA: Diagnosis present

## 2019-11-02 DIAGNOSIS — K7469 Other cirrhosis of liver: Secondary | ICD-10-CM

## 2019-11-02 DIAGNOSIS — I824Y1 Acute embolism and thrombosis of unspecified deep veins of right proximal lower extremity: Secondary | ICD-10-CM

## 2019-11-02 DIAGNOSIS — J441 Chronic obstructive pulmonary disease with (acute) exacerbation: Secondary | ICD-10-CM

## 2019-11-02 DIAGNOSIS — D72828 Other elevated white blood cell count: Secondary | ICD-10-CM

## 2019-11-02 DIAGNOSIS — I82409 Acute embolism and thrombosis of unspecified deep veins of unspecified lower extremity: Secondary | ICD-10-CM | POA: Diagnosis present

## 2019-11-02 DIAGNOSIS — Z9119 Patient's noncompliance with other medical treatment and regimen: Secondary | ICD-10-CM

## 2019-11-02 DIAGNOSIS — D72829 Elevated white blood cell count, unspecified: Secondary | ICD-10-CM | POA: Diagnosis present

## 2019-11-02 DIAGNOSIS — F17210 Nicotine dependence, cigarettes, uncomplicated: Secondary | ICD-10-CM | POA: Diagnosis present

## 2019-11-02 DIAGNOSIS — Z95828 Presence of other vascular implants and grafts: Secondary | ICD-10-CM

## 2019-11-02 DIAGNOSIS — R7303 Prediabetes: Secondary | ICD-10-CM | POA: Diagnosis present

## 2019-11-02 DIAGNOSIS — Z6841 Body Mass Index (BMI) 40.0 and over, adult: Secondary | ICD-10-CM

## 2019-11-02 DIAGNOSIS — Z20822 Contact with and (suspected) exposure to covid-19: Secondary | ICD-10-CM | POA: Diagnosis present

## 2019-11-02 DIAGNOSIS — Z86718 Personal history of other venous thrombosis and embolism: Secondary | ICD-10-CM

## 2019-11-02 DIAGNOSIS — Z7901 Long term (current) use of anticoagulants: Secondary | ICD-10-CM

## 2019-11-02 DIAGNOSIS — I480 Paroxysmal atrial fibrillation: Secondary | ICD-10-CM

## 2019-11-02 DIAGNOSIS — R0602 Shortness of breath: Secondary | ICD-10-CM

## 2019-11-02 DIAGNOSIS — I472 Ventricular tachycardia: Secondary | ICD-10-CM | POA: Diagnosis present

## 2019-11-02 DIAGNOSIS — T380X5A Adverse effect of glucocorticoids and synthetic analogues, initial encounter: Secondary | ICD-10-CM | POA: Diagnosis present

## 2019-11-02 LAB — COMPREHENSIVE METABOLIC PANEL WITH GFR
ALT: 13 U/L (ref 0–44)
AST: 21 U/L (ref 15–41)
Albumin: 3 g/dL — ABNORMAL LOW (ref 3.5–5.0)
Alkaline Phosphatase: 129 U/L — ABNORMAL HIGH (ref 38–126)
Anion gap: 10 (ref 5–15)
BUN: 9 mg/dL (ref 6–20)
CO2: 30 mmol/L (ref 22–32)
Calcium: 8.8 mg/dL — ABNORMAL LOW (ref 8.9–10.3)
Chloride: 97 mmol/L — ABNORMAL LOW (ref 98–111)
Creatinine, Ser: 0.8 mg/dL (ref 0.61–1.24)
GFR calc Af Amer: >60 mL/min
GFR calc non Af Amer: >60 mL/min
Glucose, Bld: 109 mg/dL — ABNORMAL HIGH (ref 70–99)
Potassium: 4 mmol/L (ref 3.5–5.1)
Sodium: 137 mmol/L (ref 135–145)
Total Bilirubin: 2.1 mg/dL — ABNORMAL HIGH (ref 0.3–1.2)
Total Protein: 7.7 g/dL (ref 6.5–8.1)

## 2019-11-02 LAB — LUPUS ANTICOAGULANT PANEL
DRVVT: 39.5 s (ref 0.0–47.0)
PTT Lupus Anticoagulant: 55.4 s — ABNORMAL HIGH (ref 0.0–51.9)

## 2019-11-02 LAB — HEXAGONAL PHASE PHOSPHOLIPID: Hexagonal Phase Phospholipid: 0 s (ref 0–11)

## 2019-11-02 LAB — BETA-2-GLYCOPROTEIN I ABS, IGG/M/A
Beta-2 Glyco I IgG: <9 GPI IgG units (ref 0–20)
Beta-2-Glycoprotein I IgA: <9 GPI IgA units (ref 0–25)
Beta-2-Glycoprotein I IgM: <9 GPI IgM units (ref 0–32)

## 2019-11-02 LAB — CBC WITH DIFFERENTIAL/PLATELET
Abs Immature Granulocytes: 0.05 10*3/uL (ref 0.00–0.07)
Basophils Absolute: 0.1 10*3/uL (ref 0.0–0.1)
Basophils Relative: 1 %
Eosinophils Absolute: 0.2 10*3/uL (ref 0.0–0.5)
Eosinophils Relative: 2 %
HCT: 53.5 % — ABNORMAL HIGH (ref 39.0–52.0)
Hemoglobin: 18.2 g/dL — ABNORMAL HIGH (ref 13.0–17.0)
Immature Granulocytes: 0 %
Lymphocytes Relative: 19 %
Lymphs Abs: 2.6 10*3/uL (ref 0.7–4.0)
MCH: 33.3 pg (ref 26.0–34.0)
MCHC: 34 g/dL (ref 30.0–36.0)
MCV: 98 fL (ref 80.0–100.0)
Monocytes Absolute: 1.4 10*3/uL — ABNORMAL HIGH (ref 0.1–1.0)
Monocytes Relative: 10 %
Neutro Abs: 9.4 10*3/uL — ABNORMAL HIGH (ref 1.7–7.7)
Neutrophils Relative %: 68 %
Platelets: 155 10*3/uL (ref 150–400)
RBC: 5.46 MIL/uL (ref 4.22–5.81)
RDW: 14.2 % (ref 11.5–15.5)
WBC: 13.7 10*3/uL — ABNORMAL HIGH (ref 4.0–10.5)
nRBC: 0 % (ref 0.0–0.2)

## 2019-11-02 LAB — HEPARIN LEVEL (UNFRACTIONATED): Heparin Unfractionated: 2.56 IU/mL — ABNORMAL HIGH (ref 0.30–0.70)

## 2019-11-02 LAB — LACTIC ACID, PLASMA: Lactic Acid, Venous: 1.2 mmol/L (ref 0.5–1.9)

## 2019-11-02 LAB — PROTIME-INR
INR: 1.7 — ABNORMAL HIGH (ref 0.8–1.2)
Prothrombin Time: 20.2 s — ABNORMAL HIGH (ref 11.4–15.2)

## 2019-11-02 LAB — PTT-LA MIX: PTT-LA Mix: 49.8 s — ABNORMAL HIGH (ref 0.0–48.9)

## 2019-11-02 LAB — APTT: aPTT: 33 seconds (ref 24–36)

## 2019-11-02 MED ORDER — ONDANSETRON HCL 4 MG PO TABS: 4.0000 mg | ORAL_TABLET | Freq: Four times a day (QID) | ORAL | Status: DC | PRN

## 2019-11-02 MED ORDER — TRAMADOL HCL 50 MG PO TABS
50.0000 mg | ORAL_TABLET | Freq: Four times a day (QID) | ORAL | Status: DC | PRN
  Administered 2019-11-02 – 2019-11-04 (×3): 50 mg via ORAL
  Filled 2019-11-02 (×5): qty 1

## 2019-11-02 MED ORDER — HEPARIN BOLUS VIA INFUSION
5000.0000 [IU] | Freq: Once | INTRAVENOUS | Status: AC
  Administered 2019-11-02: 18:00:00 5000 [IU] via INTRAVENOUS
  Filled 2019-11-02: qty 5000

## 2019-11-02 MED ORDER — ACETAMINOPHEN 325 MG PO TABS: 650.0000 mg | ORAL_TABLET | Freq: Four times a day (QID) | ORAL | Status: DC | PRN

## 2019-11-02 MED ORDER — MORPHINE SULFATE (PF) 2 MG/ML IV SOLN
2.0000 mg | INTRAVENOUS | Status: DC | PRN
  Administered 2019-11-02 – 2019-11-03 (×2): 2 mg via INTRAVENOUS
  Filled 2019-11-02 (×2): qty 1

## 2019-11-02 MED ORDER — BISACODYL 5 MG PO TBEC: 5.0000 mg | DELAYED_RELEASE_TABLET | Freq: Every day | ORAL | Status: DC | PRN

## 2019-11-02 MED ORDER — MORPHINE SULFATE (PF) 4 MG/ML IV SOLN
4.0000 mg | Freq: Once | INTRAVENOUS | Status: AC
  Administered 2019-11-02: 16:00:00 4 mg via INTRAVENOUS
  Filled 2019-11-02: qty 1

## 2019-11-02 MED ORDER — ACETAMINOPHEN 650 MG RE SUPP: 650.0000 mg | Freq: Four times a day (QID) | RECTAL | Status: DC | PRN

## 2019-11-02 MED ORDER — ONDANSETRON HCL 4 MG/2ML IJ SOLN
4.0000 mg | Freq: Once | INTRAMUSCULAR | Status: AC
  Administered 2019-11-02: 16:00:00 4 mg via INTRAVENOUS
  Filled 2019-11-02: qty 2

## 2019-11-02 MED ORDER — ONDANSETRON HCL 4 MG/2ML IJ SOLN: 4.0000 mg | Freq: Four times a day (QID) | INTRAMUSCULAR | Status: DC | PRN

## 2019-11-02 MED ORDER — HEPARIN (PORCINE) 25000 UT/250ML-% IV SOLN
2450.0000 [IU]/h | INTRAVENOUS | Status: DC
  Administered 2019-11-02: 1500 [IU]/h via INTRAVENOUS
  Administered 2019-11-03: 22:00:00 1950 [IU]/h via INTRAVENOUS
  Administered 2019-11-03: 1650 [IU]/h via INTRAVENOUS
  Administered 2019-11-04: 20:00:00 2350 [IU]/h via INTRAVENOUS
  Administered 2019-11-05 – 2019-11-06 (×3): 2450 [IU]/h via INTRAVENOUS
  Filled 2019-11-02 (×8): qty 250

## 2019-11-02 MED ORDER — SODIUM CHLORIDE 0.9% FLUSH
3.0000 mL | Freq: Two times a day (BID) | INTRAVENOUS | Status: DC
  Administered 2019-11-02 – 2019-11-06 (×6): 3 mL via INTRAVENOUS

## 2019-11-02 NOTE — Progress Notes (Signed)
Cliffton Asters, NP notified via secure chat that patient had unsustained 6 beat run of V-tach during 8/10 pain rating, now in A-fib rhythm and asleep. Windy Carina, RN 9:18 PM;11/02/2019

## 2019-11-02 NOTE — Progress Notes (Signed)
Nurse reports 6 beat v tach run during acute RLE pain. No accompanied symptoms with v tach run.  Remains on tele on heparin drip in a fiv with v rate in 90's/ Review of records available in and outpatietn, no history found for rate controlling medication regimen.   Mag level pending

## 2019-11-02 NOTE — ED Triage Notes (Signed)
Patient presents to the ED via EMS with right knee pain since surgery to right knee on Friday.  Patient reports he was discharged without any pain medication and is having severe pain.  Patient states he had a similar surgery previously to his left knee and did not have any pain.  Patient has history of DVTs.

## 2019-11-02 NOTE — ED Notes (Signed)
See triage note  States he was admitted to hospital last week for clot to right leg  States he had the clot removed   States he is having pain from groin and it is radiating into lower leg   Leg is warm to touch with some redness and swelling

## 2019-11-02 NOTE — ED Notes (Signed)
IV team at bedside starting second PIV.

## 2019-11-02 NOTE — ED Notes (Signed)
Pt to ultrasound

## 2019-11-02 NOTE — ED Provider Notes (Signed)
[facility] Emergency Department Provider Note ____________________________________________  Time seen: Approximately 3:30 PM  I have reviewed the triage vital signs and the nursing notes.   HISTORY  Chief Complaint Knee Pain    HPI Matthew [name] is a 49 y.o. male who presents to the emergency department for evaluation and treatment of right lower extremity pain, swelling, and erythema. Significant past medical history of recent DVT with thrombectomy. Procedure was performed 3 days ago. He states that he was discharged home Friday afternoon and by Sunday morning, pain was intense and he was again unable to bear weight. He states that he has been compliant with his Xarelto since discharge. He denies chest pain or shortness of breath. No injury. No fever.   Past Medical History:  Diagnosis Date  . Arthritis   . Hx of blood clots   . Sleep apnea    patient states not anymore    Patient Active Problem List   Diagnosis Date Noted  . Tobacco abuse 10/29/2019  . Prediabetes 04/25/2019  . Wheezing on auscultation 04/25/2019  . Ingrown toenail of right foot 04/23/2019  . Atrial fibrillation (HCC) 03/19/2019  . DVT (deep venous thrombosis) (HCC) 03/19/2019  . Bilateral pneumonia 03/11/2019  . Acute DVT (deep venous thrombosis) (HCC) 02/17/2019  . DVT of lower extremity (deep venous thrombosis) (HCC) 11/19/2018    Past Surgical History:  Procedure Laterality Date  . IVC FILTER INSERTION Left 11/24/2018   Procedure: IVC FILTER INSERTION;  Surgeon: Annice Needy, MD;  Location: [facility] INVASIVE CV LAB;  Service: Cardiovascular;  Laterality: Left;  . IVC FILTER INSERTION Right 02/18/2019   Procedure: IVC FILTER INSERTION WITH RIGHT LOWER EXTREMITY VENOUS LYSIS;  Surgeon: Renford Dills, MD;  Location: [facility] INVASIVE CV LAB;  Service: Cardiovascular;  Laterality: Right;  . IVC FILTER REMOVAL N/A 12/01/2018   Procedure: IVC FILTER REMOVAL;  Surgeon: Annice Needy, MD;   Location: [facility] INVASIVE CV LAB;  Service: Cardiovascular;  Laterality: N/A;  . NO PAST SURGERIES    . PERIPHERAL VASCULAR THROMBECTOMY Right 10/30/2019   Procedure: PERIPHERAL VASCULAR THROMBECTOMY / THROMBOLYSIS;  Surgeon: Annice Needy, MD;  Location: [facility] INVASIVE CV LAB;  Service: Cardiovascular;  Laterality: Right;    Prior to Admission medications   Medication Sig Start Date End Date Taking? Authorizing Provider  albuterol (ACCUNEB) 1.25 MG/3ML nebulizer solution Take 1 ampule by nebulization every 6 (six) hours as needed for wheezing.    [provider]  fexofenadine-pseudoephedrine (ALLEGRA-D) 60-120 MG 12 hr tablet Take 1 tablet by mouth 2 (two) times daily. 09/21/19   Joni Reining, PA-C  Rivaroxaban (XARELTO) 15 MG TABS tablet Take 1 tablet (15 mg total) by mouth 2 (two) times daily. 10/30/19   Enedina Finner, MD  rivaroxaban (XARELTO) 20 MG TABS tablet Take 1 tablet (20 mg total) by mouth daily. 11/20/19   Enedina Finner, MD    Allergies Patient has no known allergies.  No family history on file.  Social History Social History   Tobacco Use  . Smoking status: Current Every Day Smoker    Packs/day: 0.50    Types: Cigarettes  . Smokeless tobacco: Current User  Substance Use Topics  . Alcohol use: Yes    Comment: weekends says every third weekend a 12 pack  . Drug use: Never    Review of Systems Constitutional: Negative for fever. Cardiovascular: Negative for chest pain. Respiratory: Negative for shortness of breath. Musculoskeletal: Positive for right lower extremity pain and swelling  Skin: Negative for open wounds or lesions.  Neurological: Negative for decrease in sensation  ____________________________________________   PHYSICAL EXAM:  VITAL SIGNS: ED Triage Vitals  Enc Vitals Group     BP 11/02/19 1351 102/72     Pulse Rate 11/02/19 1351 84     Resp 11/02/19 1351 18     Temp 11/02/19 1351 99 F (37.2 C)     Temp Source 11/02/19 1351 Oral     SpO2  11/02/19 1344 97 %     Weight 11/02/19 1352 257 lb (116.6 kg)     Height 11/02/19 1352 5\' 7"  (1.702 m)     Head Circumference --      Peak Flow --      Pain Score --      Pain Loc --      Pain Edu? --      Excl. in GC? --     Constitutional: Alert and oriented. Well appearing and in no acute distress. Eyes: Conjunctivae are clear without discharge or drainage Head: Atraumatic Neck: Supple Respiratory: No cough. Respirations are even and unlabored. Musculoskeletal: Right lower extremity diffusely edematous, erythematous, and tender.  Dorsalis pedis pulse is 1+ Neurologic: Motor and sensory function of the right lower extremity intact Skin: Right lower extremity diffusely erythematous without any open wound or lesion Psychiatric: Affect and behavior are appropriate.  ____________________________________________   LABS (all labs ordered are listed, but only abnormal results are displayed)  Labs Reviewed  COMPREHENSIVE METABOLIC PANEL - Abnormal; Notable for the following components:      Result Value   Chloride 97 (*)    Glucose, Bld 109 (*)    Calcium 8.8 (*)    Albumin 3.0 (*)    Alkaline Phosphatase 129 (*)    Total Bilirubin 2.1 (*)    All other components within normal limits  CBC WITH DIFFERENTIAL/PLATELET - Abnormal; Notable for the following components:   WBC 13.7 (*)    Hemoglobin 18.2 (*)    HCT 53.5 (*)    Neutro Abs 9.4 (*)    Monocytes Absolute 1.4 (*)    All other components within normal limits  PROTIME-INR - Abnormal; Notable for the following components:   Prothrombin Time 20.2 (*)    INR 1.7 (*)    All other components within normal limits  LACTIC ACID, PLASMA  LACTIC ACID, PLASMA  APTT  HEPARIN LEVEL (UNFRACTIONATED)   ____________________________________________  RADIOLOGY  Ultrasound of the right lower extremity shows an extensive, occlusive DVT  I, Leniyah Martell, personally viewed and evaluated these images (plain radiographs) as part of my  medical decision making, as well as reviewing the written report by the radiologist.  Venous Img Lower Unilateral Right  Result Date: 11/02/2019 CLINICAL DATA:  Pain, swelling the right lower extremity with thrombectomy 10/30/2019 EXAM: RIGHT LOWER EXTREMITY VENOUS DOPPLER ULTRASOUND TECHNIQUE: Gray-scale sonography with compression, as well as color and duplex ultrasound, were performed to evaluate the deep venous system(s) from the level of the common femoral vein through the popliteal and proximal calf veins. COMPARISON:  Ultrasound 10/29/2019 FINDINGS: VENOUS Echogenic, noncompressible largely occlusive thrombus is seen within the common femoral vein, saphenofemoral junction, profundus femoral vein, femoral vein, and popliteal vein. The calf veins are poorly visualized secondary to soft tissue swelling and edematous changes. Waveforms of clear largely diminished with lack of phasicity and minimal response to augmentation. Additional filling defect seen in the greater saphenous vein at the level of the calf and thigh. Limited views  of the contralateral common femoral vein are unremarkable. OTHER None. Limitations: Edematous changes of calf result in difficulty assessing the calf veins for thrombus. IMPRESSION: Instance of deep venous thrombosis extending throughout the vasculature from the right common femoral vein to the level of popliteal vein. Additional thrombus seen throughout the superficial greater saphenous vein as well. More distal evaluation the calf veins is limited due to edematous changes of the lower extremity. Critical Value/emergent results were called by telephone at the time of interpretation on 11/02/2019 at 3:33 pm to provider Matthew [name] , who verbally acknowledged these results. Electronically Signed   By: Lovena Le M.D.   On: 11/02/2019 15:34   ____________________________________________   PROCEDURES  Procedures  ____________________________________________   INITIAL  IMPRESSION / ASSESSMENT AND PLAN / ED COURSE  Matthew [name] is a 49 y.o. who presents to the emergency department for treatment and evaluation of right lower extremity pain 3 days after thrombectomy.  Plan will be to repeat ultrasound, provide pain control, check labs, and discuss with vascular.  Differential diagnosis includes but is not limited to: Occlusive DVT in the right lower extremity, cellulitis of the right lower extremity, peripheral edema of the right lower extremity  Ultrasound again showing an extensive, occlusive DVT throughout the right lower extremity. Awaiting labs and then will consult vascular with plan to admit via hospitalist service. Patient made aware and agrees with plan.  ----------------------------------------- 4:55 PM on 11/02/2019 ----------------------------------------- Consulted with Dr. Delana Meyer vascular.  He advises heparin drip and admission via hospitalist service.  Hospitalist consult has been entered.  ----------------------------------------- 5:15 PM on 11/02/2019 -----------------------------------------  Dr. Jimmye Norman with hospitalist service has accepted patient for admission. Continue to await Heparin from pharmacy. Patient states he is more comfortable after morphine. He is aware of confirmed admission.  Medications  heparin bolus via infusion 5,000 Units (has no administration in time range)  heparin ADULT infusion 100 units/mL (25000 units/283mL sodium chloride 0.45%) (has no administration in time range)  morphine 4 MG/ML injection 4 mg (4 mg Intravenous Given 11/02/19 1533)  ondansetron (ZOFRAN) injection 4 mg (4 mg Intravenous Given 11/02/19 1533)    Pertinent labs & imaging results that were available during my care of the patient were reviewed by me and considered in my medical decision making (see chart for details).   _________________________________________   FINAL CLINICAL IMPRESSION(S) / ED DIAGNOSES  Final diagnoses:  DVT of  deep femoral vein, right [facility])    ED Discharge Orders    None       If controlled substance prescribed during this visit, 12 month history viewed on the [place] prior to issuing an initial prescription for Schedule II or III opiod.   Victorino Dike, FNP 11/02/19 1716    Lavonia Drafts, MD 11/02/19 1929

## 2019-11-02 NOTE — Progress Notes (Signed)
ANTICOAGULATION CONSULT NOTE  Pharmacy Consult for heparin Indication: DVT  No Known Allergies  Patient Measurements: Height: 5\' 7"  (170.2 cm) Weight: 257 lb (116.6 kg) IBW/kg (Calculated) : 66.1 Heparin Dosing Weight: 92 kg  Vital Signs: Temp: 99 F (37.2 C) (03/01 1351) Temp Source: Oral (03/01 1351) BP: 102/72 (03/01 1351) Pulse Rate: 84 (03/01 1351)  Labs: Recent Labs    11/02/19 1534 11/02/19 1540  HGB 18.2*  --   HCT 53.5*  --   PLT 155  --   APTT  --  33  LABPROT  --  20.2*  INR  --  1.7*  HEPARINUNFRC  --  2.56*  CREATININE 0.80  --     Estimated Creatinine Clearance: 136.3 mL/min (by C-G formula based on SCr of 0.8 mg/dL).   Medical History: Past Medical History:  Diagnosis Date  . Arthritis   . Hx of blood clots   . 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 Patient received thrombolysis with tPA and mechanical thrombectomy and started on Xarelto after having been on heparin. Patient presents back with pain to right knee and groin. Ultrasound of the right lower extremity showed extensive DVT throughout vasculature. Per EDP consult with Vascular, start heparin drip. Pharmacy consulted. Patient reports compliance with Xarelto since recent discharge with last dose 3/1 at ~ 0700.  Goal of Therapy:  APTT 66-102 seconds Heparin level 0.3-0.7 units/ml Monitor platelets by anticoagulation protocol: Yes   Plan:  Heparin 5000 unit bolus followed by heparin drip at 1500 units/hr. It appears patient was therapeutic on heparin at 1550 units/hr on previous admission. Will follow APTT as patient has been on Xarelto and baseline HL 2.56. APTT ordered 3/2 at 0000. CBC daily while on heparin drip.  3/26, PharmD 11/02/2019,5:57 PM

## 2019-11-02 NOTE — ED Notes (Signed)
Attempted to start second IV, unsuccessful.

## 2019-11-02 NOTE — H&P (Addendum)
History and Physical    Matthew [name] DJM:[id number] DOB: 06/27/1971 DOA: 11/02/2019  PCP: Patient, No Pcp Per  Patient coming from: home   Chief Complaint: RLE pain  HPI: 49 y/o M w/ PMH of morbid obesity, DVTs on xarelto s/p IVC filter, a. Fib, pre-DM, tobacco use who presents w/ RLE pain x 4 days. The pain sharp, constant w/o radiation. Morphine makes the pain better and walking makes the pain worse. The severity is currently 8/10. Of note, pt was dx w/ RLE DVT on 10/29/19 s/p thrombectomy via Dr. Wyn Quaker & sent home on xarelto. Pt states he has been taking xarelto as prescribed and has not missed a dose. Pt denies long plane or car rides recently. Pt denies a sedentary lifestyle. Pt denies seeing a hematologist in the past.  Pt denies any dizziness/lightheadedness, fever, chills, sweating, chest pain, shortness of breath, nausea, vomiting, abd pain, dysuria, urinary frequency, urinary urgency, diarrhea, or constipation     Review of Systems: As per HPI otherwise 10 point review of systems negative.    Past Medical History:  Diagnosis Date  . Arthritis   . Hx of blood clots   . Sleep apnea    patient states not anymore    Past Surgical History:  Procedure Laterality Date  . IVC FILTER INSERTION Left 11/24/2018   Procedure: IVC FILTER INSERTION;  Surgeon: Annice Needy, MD;  Location: [facility] INVASIVE CV LAB;  Service: Cardiovascular;  Laterality: Left;  . IVC FILTER INSERTION Right 02/18/2019   Procedure: IVC FILTER INSERTION WITH RIGHT LOWER EXTREMITY VENOUS LYSIS;  Surgeon: Renford Dills, MD;  Location: [facility] INVASIVE CV LAB;  Service: Cardiovascular;  Laterality: Right;  . IVC FILTER REMOVAL N/A 12/01/2018   Procedure: IVC FILTER REMOVAL;  Surgeon: Annice Needy, MD;  Location: [facility] INVASIVE CV LAB;  Service: Cardiovascular;  Laterality: N/A;  . NO PAST SURGERIES    . PERIPHERAL VASCULAR THROMBECTOMY Right 10/30/2019   Procedure: PERIPHERAL VASCULAR THROMBECTOMY / THROMBOLYSIS;   Surgeon: Annice Needy, MD;  Location: [facility] INVASIVE CV LAB;  Service: Cardiovascular;  Laterality: Right;     reports that he has been smoking cigarettes. He has been smoking about 0.50 packs per day. He uses smokeless tobacco. He reports current alcohol use. He reports that he does not use drugs.  No Known Allergies  No family history on file.   Fam Hx: Pt refuses to answer any more questions secondary to RLE pain    Prior to Admission medications   Medication Sig Start Date End Date Taking? Authorizing Provider  rivaroxaban (XARELTO) 20 MG TABS tablet Take 1 tablet (20 mg total) by mouth daily. 11/20/19  Yes Enedina Finner, MD  Rivaroxaban (XARELTO) 15 MG TABS tablet Take 1 tablet (15 mg total) by mouth 2 (two) times daily. Patient not taking: Reported on 11/02/2019 10/30/19   Enedina Finner, MD    Physical Exam: Vitals:   11/02/19 1344 11/02/19 1351 11/02/19 1352  BP:  102/72   Pulse:  84   Resp:  18   Temp:  99 F (37.2 C)   TempSrc:  Oral   SpO2: 97% 95%   Weight:   116.6 kg  Height:   5\' 7"  (1.702 m)    Constitutional: NAD, calm, comfortable Vitals:   11/02/19 1344 11/02/19 1351 11/02/19 1352  BP:  102/72   Pulse:  84   Resp:  18   Temp:  99 F (37.2 C)   TempSrc:  Oral   SpO2:  97% 95%   Weight:   116.6 kg  Height:   5\' 7"  (1.702 m)   Eyes: PERRL, lids and conjunctivae normal ENMT: Mucous membranes are moist. Posterior pharynx clear of any exudate or lesions. Neck: normal, supple Respiratory: diminished breath sounds b/l, no wheezing, no crackles. Normal respiratory effort. No accessory muscle use.  Cardiovascular: irregularly irregular. No rubs / gallops.  B/L LE edema Abdomen: soft, no tenderness, obese. Hypoactive bowel sounds  Musculoskeletal: no cyanosis.  Skin: no rashes, lesions Neurologic: CN 2-12 grossly intact. Moves all 4 extremities  Psychiatric: Normal judgment and insight. Alert and oriented x 3. Agitated & frustrated     Labs on Admission: I have  personally reviewed following labs and imaging studies  CBC: Recent Labs  Lab 10/29/19 0839 10/30/19 0716 11/02/19 1534  WBC 10.2 9.8 13.7*  NEUTROABS  --   --  9.4*  HGB 19.0* 17.7* 18.2*  HCT 56.9* 53.7* 53.5*  MCV 99.5 102.3* 98.0  PLT 128* 125* 245   Basic Metabolic Panel: Recent Labs  Lab 10/29/19 0839 10/30/19 0716 11/02/19 1534  NA 137 134* 137  K 4.4 4.8 4.0  CL 98 96* 97*  CO2 30 30 30   GLUCOSE 114* 109* 109*  BUN 12 16 9   CREATININE 0.67 0.89 0.80  CALCIUM 8.9 8.6* 8.8*   GFR: Estimated Creatinine Clearance: 136.3 mL/min (by C-G formula based on SCr of 0.8 mg/dL). Liver Function Tests: Recent Labs  Lab 10/29/19 0839 11/02/19 1534  AST 22 21  ALT 15 13  ALKPHOS 151* 129*  BILITOT 1.4* 2.1*  PROT 8.4* 7.7  ALBUMIN 3.5 3.0*   No results for input(s): LIPASE, AMYLASE in the last 168 hours. No results for input(s): AMMONIA in the last 168 hours. Coagulation Profile: Recent Labs  Lab 10/29/19 0839 11/02/19 1540  INR 1.1 1.7*   Cardiac Enzymes: No results for input(s): CKTOTAL, CKMB, CKMBINDEX, TROPONINI in the last 168 hours. BNP (last 3 results) No results for input(s): PROBNP in the last 8760 hours. HbA1C: No results for input(s): HGBA1C in the last 72 hours. CBG: No results for input(s): GLUCAP in the last 168 hours. Lipid Profile: No results for input(s): CHOL, HDL, LDLCALC, TRIG, CHOLHDL, LDLDIRECT in the last 72 hours. Thyroid Function Tests: No results for input(s): TSH, T4TOTAL, FREET4, T3FREE, THYROIDAB in the last 72 hours. Anemia Panel: No results for input(s): VITAMINB12, FOLATE, FERRITIN, TIBC, IRON, RETICCTPCT in the last 72 hours. Urine analysis:    Component Value Date/Time   COLORURINE STRAW (A) 06/03/2019 1426   APPEARANCEUR CLEAR (A) 06/03/2019 1426   APPEARANCEUR Clear 02/05/2019 1251   LABSPEC 1.005 06/03/2019 1426   PHURINE 6.0 06/03/2019 1426   GLUCOSEU NEGATIVE 06/03/2019 1426   HGBUR NEGATIVE 06/03/2019 1426    BILIRUBINUR NEGATIVE 06/03/2019 1426   BILIRUBINUR Negative 02/05/2019 1251   KETONESUR NEGATIVE 06/03/2019 1426   PROTEINUR NEGATIVE 06/03/2019 1426   NITRITE NEGATIVE 06/03/2019 1426   LEUKOCYTESUR NEGATIVE 06/03/2019 1426    Radiological Exams on Admission: US Venous Img Lower Unilateral Right  Result Date: 11/02/2019 CLINICAL DATA:  Pain, swelling the right lower extremity with thrombectomy 10/30/2019 EXAM: RIGHT LOWER EXTREMITY VENOUS DOPPLER ULTRASOUND TECHNIQUE: Gray-scale sonography with compression, as well as color and duplex ultrasound, were performed to evaluate the deep venous system(s) from the level of the common femoral vein through the popliteal and proximal calf veins. COMPARISON:  Ultrasound 10/29/2019 FINDINGS: VENOUS Echogenic, noncompressible largely occlusive thrombus is seen within the common femoral vein, saphenofemoral junction,  profundus femoral vein, femoral vein, and popliteal vein. The calf veins are poorly visualized secondary to soft tissue swelling and edematous changes. Waveforms of clear largely diminished with lack of phasicity and minimal response to augmentation. Additional filling defect seen in the greater saphenous vein at the level of the calf and thigh. Limited views of the contralateral common femoral vein are unremarkable. OTHER None. Limitations: Edematous changes of calf result in difficulty assessing the calf veins for thrombus. IMPRESSION: Instance of deep venous thrombosis extending throughout the vasculature from the right common femoral vein to the level of popliteal vein. Additional thrombus seen throughout the superficial greater saphenous vein as well. More distal evaluation the calf veins is limited due to edematous changes of the lower extremity. Critical Value/emergent results were called by telephone at the time of interpretation on 11/02/2019 at 3:33 pm to provider CARI TRIPLETT , who verbally acknowledged these results. Electronically Signed   By:  Kreg Shropshire M.D.   On: 11/02/2019 15:34    EKG: Independently reviewed.   Assessment/Plan Active Problems:   * No active hospital problems. * RLE DVT: here last week w/ RLE s/p thrombectomy & continued on xarelto. Pt has been on xarelto for many years evidently. Consider possibility of failed xarelto and would recommend coumadin w/ lovenox bridge considering pt's BMI. Previous hypercoagulable work-up is pending. Etiology unclear. Denies long plane/car rides, denies sedentary lifestyle. Hx of non-compliance. Vascular surgery consulted. NPO after midnight . Tylenol, tramadol & morphine prn for pain  Morbid obesity: BMI 40.2. Would benefit greatly from weight loss.   Pre-DM: would benefit from weight loss. Carb modified diet  Leukocytosis: likely reactive. Will continue to monitor  A. fib: PAF vs chronic. Continue on tele. Unknown if pt is on rate controlling meds at med rec is not done. Pharmacy consulted to verify med rec     DVT prophylaxis: IV heparin drip Code Status: full  Family Communication:  Disposition Plan:  Consults called: vascular surgery Admission status:inpatient   Charise Killian MD [facility] Pager [phone]   If 7PM-7AM, please contact night-coverage [email or web address]   11/02/2019, 5:39 PM

## 2019-11-03 DIAGNOSIS — D696 Thrombocytopenia, unspecified: Secondary | ICD-10-CM

## 2019-11-03 LAB — APTT
aPTT: 53 seconds — ABNORMAL HIGH (ref 24–36)
aPTT: 54 seconds — ABNORMAL HIGH (ref 24–36)
aPTT: 55 seconds — ABNORMAL HIGH (ref 24–36)
aPTT: 58 seconds — ABNORMAL HIGH (ref 24–36)

## 2019-11-03 LAB — BASIC METABOLIC PANEL
Anion gap: 7 (ref 5–15)
BUN: 10 mg/dL (ref 6–20)
CO2: 31 mmol/L (ref 22–32)
Calcium: 8.5 mg/dL — ABNORMAL LOW (ref 8.9–10.3)
Chloride: 99 mmol/L (ref 98–111)
Creatinine, Ser: 0.66 mg/dL (ref 0.61–1.24)
GFR calc Af Amer: >60 mL/min (ref >60)
GFR calc non Af Amer: >60 mL/min (ref >60)
Glucose, Bld: 112 mg/dL — ABNORMAL HIGH (ref 70–99)
Potassium: 4.2 mmol/L (ref 3.5–5.1)
Sodium: 137 mmol/L (ref 135–145)

## 2019-11-03 LAB — CBC
HCT: 50.5 % (ref 39.0–52.0)
Hemoglobin: 17 g/dL (ref 13.0–17.0)
MCH: 33.3 pg (ref 26.0–34.0)
MCHC: 33.7 g/dL (ref 30.0–36.0)
MCV: 98.8 fL (ref 80.0–100.0)
Platelets: 145 10*3/uL — ABNORMAL LOW (ref 150–400)
RBC: 5.11 MIL/uL (ref 4.22–5.81)
RDW: 14.3 % (ref 11.5–15.5)
WBC: 13.7 10*3/uL — ABNORMAL HIGH (ref 4.0–10.5)
nRBC: 0 % (ref 0.0–0.2)

## 2019-11-03 LAB — FACTOR 5 LEIDEN

## 2019-11-03 LAB — MAGNESIUM: Magnesium: 1.5 mg/dL — ABNORMAL LOW (ref 1.7–2.4)

## 2019-11-03 LAB — PROTHROMBIN GENE MUTATION

## 2019-11-03 MED ORDER — HEPARIN BOLUS VIA INFUSION
1200.0000 [IU] | Freq: Once | INTRAVENOUS | Status: AC
  Administered 2019-11-03: 10:00:00 1200 [IU] via INTRAVENOUS
  Filled 2019-11-03: qty 1200

## 2019-11-03 MED ORDER — MAGNESIUM OXIDE 400 (241.3 MG) MG PO TABS
800.0000 mg | ORAL_TABLET | Freq: Two times a day (BID) | ORAL | Status: DC
  Administered 2019-11-04 – 2019-11-05 (×2): 800 mg via ORAL
  Filled 2019-11-03 (×4): qty 2

## 2019-11-03 MED ORDER — HEPARIN BOLUS VIA INFUSION
1000.0000 [IU] | Freq: Once | INTRAVENOUS | Status: AC
  Administered 2019-11-03: 01:00:00 1000 [IU] via INTRAVENOUS
  Filled 2019-11-03: qty 1000

## 2019-11-03 MED ORDER — NICOTINE 14 MG/24HR TD PT24
14.0000 mg | MEDICATED_PATCH | TRANSDERMAL | Status: DC
  Administered 2019-11-03 – 2019-11-06 (×4): 14 mg via TRANSDERMAL
  Filled 2019-11-03 (×5): qty 1

## 2019-11-03 MED ORDER — MORPHINE SULFATE (PF) 2 MG/ML IV SOLN
2.0000 mg | INTRAVENOUS | Status: DC | PRN
  Administered 2019-11-03 – 2019-11-04 (×8): 2 mg via INTRAVENOUS
  Filled 2019-11-03 (×8): qty 1

## 2019-11-03 MED ORDER — MAGNESIUM SULFATE 4 GM/100ML IV SOLN
4.0000 g | Freq: Once | INTRAVENOUS | Status: AC
  Administered 2019-11-03: 10:00:00 4 g via INTRAVENOUS
  Filled 2019-11-03: qty 100

## 2019-11-03 MED ORDER — HEPARIN BOLUS VIA INFUSION
1200.0000 [IU] | Freq: Once | INTRAVENOUS | Status: AC
  Administered 2019-11-03: 17:00:00 1200 [IU] via INTRAVENOUS
  Filled 2019-11-03: qty 1200

## 2019-11-03 MED ORDER — HEPARIN BOLUS VIA INFUSION
1200.0000 [IU] | Freq: Once | INTRAVENOUS | Status: AC
  Administered 2019-11-04: 1200 [IU] via INTRAVENOUS
  Filled 2019-11-03: qty 1200

## 2019-11-03 NOTE — Progress Notes (Signed)
[facility] Daily Progress Note   Subjective: The patient is a 49 year old male well-known to our service who presented to the [facility] emergency department complaining of pain and swelling to the right lower extremity. The patient is well-known to our service as he has been diagnosed with DVT in the past with subsequent IVC filter insertion and venous thrombectomy / thrombolysis. Most recent intervention was on 10/30/19:  1. US guidance for vascular access to right popliteal vein 2. Catheter placement into right common iliac vein from right popliteal approach 3. IVC gram and right lower extremity venogram 4.   Catheter directed thrombolysis with 6 mg of TPA to the right superficial femoral vein, common femoral vein, and iliac veins 5. Mechanical thrombectomy to right superficial femoral vein, common femoral vein, and iliac veins with the penumbra cat 12 device  Patient was discharged on Xarelto.   Venous Duplex 11/02/19: Instance of deep venous thrombosis extending throughout the vasculature from the right common femoral vein to the level of popliteal vein. Additional thrombus seen throughout the superficial greater saphenous vein as well.  Vascular Surgery was consulted by Dr. Mayford Knife  Objective: Vitals:   11/03/19 0431 11/03/19 0519 11/03/19 0727 11/03/19 0728  BP: (!) 123/96     Pulse: (!) 113 95    Resp: 20     Temp: 99.6 F (37.6 C)     TempSrc:      SpO2: (!) 83%  (!) 84% 93%  Weight:      Height:        Intake/Output Summary (Last 24 hours) at 11/03/2019 1014 Last data filed at 11/03/2019 0431 Gross per 24 hour  Intake 131.63 ml  Output 200 ml  Net -68.37 ml   Physical Exam: A&Ox3, NAD CV: RRR Pulmonary: CTA Bilaterally Abdomen: Soft, Nontender, Nondistended Vascular:  Right Lower Extremity: Thigh soft, Calf soft but edematous. No acute vascular compromise noted on exam at this time.   Laboratory: CBC    Component  Value Date/Time   WBC 13.7 (H) 11/03/2019 0024   HGB 17.0 11/03/2019 0024   HGB 17.8 (H) 09/10/2018 0930   HCT 50.5 11/03/2019 0024   HCT 51.3 (H) 09/10/2018 0930   PLT 145 (L) 11/03/2019 0024   PLT 233 09/10/2018 0930   BMET    Component Value Date/Time   NA 137 11/03/2019 0024   NA 143 09/10/2018 0930   K 4.2 11/03/2019 0024   CL 99 11/03/2019 0024   CO2 31 11/03/2019 0024   GLUCOSE 112 (H) 11/03/2019 0024   BUN 10 11/03/2019 0024   BUN 8 09/10/2018 0930   CREATININE 0.66 11/03/2019 0024   CALCIUM 8.5 (L) 11/03/2019 0024   GFRNONAA >60 11/03/2019 0024   GFRAA >60 11/03/2019 0024   Assessment/Planning: The patient is a 49 year old male with known history of DVT status post IVC filter insertion mass multiple lower extremity venous thrombectomy / thrombolysis who presents to the [facility] emergency department with recurrent right lower extremity DVT  1) Patient with recurrent right lower extremity DVT - unfortunately, there is a known history noncompliant with his medications.  Patient states he was taking Xarelto however this is highly unlikely.  Patient also questioning if our service actually conducted a venous thrombectomy/lysis last week.  I showed the patient the following picture:    Document Information Photos    10/30/2019 15:01  Attached To:  Hospital Encounter on 10/29/19  Source Information Pattye Meda, Ranae Plumber, PA-C  [facility]   Agree with initiation of heparin.  Hopefully, will be able to add patient to the schedule on Thursday with Dr. Lucky Cowboy for repeat right lower extremity venous thrombolysis / thrombectomy.  Discussed with Dr. Ellis Parents Jatavion Peaster PA-C 11/03/2019 10:14 AM

## 2019-11-03 NOTE — Progress Notes (Signed)
ANTICOAGULATION CONSULT NOTE  Pharmacy Consult for heparin Indication: DVT  No Known Allergies  Patient Measurements: Height: 5\' 7"  (170.2 cm) Weight: 257 lb (116.6 kg) IBW/kg (Calculated) : 66.1 Heparin Dosing Weight: 92 kg  Vital Signs: Temp: 99.8 F (37.7 C) (03/01 1910) Temp Source: Oral (03/01 1910) BP: 101/66 (03/01 1910) Pulse Rate: 85 (03/01 1910)  Labs: Recent Labs    11/02/19 1534 11/02/19 1540 11/03/19 0024  HGB 18.2*  --  17.0  HCT 53.5*  --  50.5  PLT 155  --  145*  APTT  --  33 54*  LABPROT  --  20.2*  --   INR  --  1.7*  --   HEPARINUNFRC  --  2.56*  --   CREATININE 0.80  --   --     Estimated Creatinine Clearance: 136.3 mL/min (by C-G formula based on SCr of 0.8 mg/dL).   Medical History: Past Medical History:  Diagnosis Date  . Arthritis   . Hx of blood clots   . 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 Patient received thrombolysis with tPA and mechanical thrombectomy and started on Xarelto after having been on heparin. Patient presents back with pain to right knee and groin. Ultrasound of the right lower extremity showed extensive DVT throughout vasculature. Per EDP consult with Vascular, start heparin drip. Pharmacy consulted. Patient reports compliance with Xarelto since recent discharge with last dose 3/1 at ~ 0700.  Goal of Therapy:  APTT 66-102 seconds Heparin level 0.3-0.7 units/ml Monitor platelets by anticoagulation protocol: Yes   Plan:  Heparin 5000 unit bolus followed by heparin drip at 1500 units/hr. It appears patient was therapeutic on heparin at 1550 units/hr on previous admission. Will follow APTT as patient has been on Xarelto and baseline HL 2.56. APTT ordered 3/2 at 0000. CBC daily while on heparin drip.  3/2 0024 aPTT 54, subtherapeutic.  H/H ok, PLTs dropped slightly.  Will rebolus with 1000 units x 1 and  increase infusion to 1650 units/hr, recheck aPTT 6 hours after rate increase.    3/26, PharmD 11/03/2019,1:06 AM

## 2019-11-03 NOTE — Progress Notes (Signed)
ANTICOAGULATION CONSULT NOTE  Pharmacy Consult for heparin Indication: DVT  No Known Allergies  Patient Measurements: Height: 5\' 7"  (170.2 cm) Weight: 257 lb (116.6 kg) IBW/kg (Calculated) : 66.1 Heparin Dosing Weight: 92 kg  Vital Signs: Temp: 98.3 F (36.8 C) (03/02 2319) Temp Source: Oral (03/02 2319) BP: 86/65 (03/02 2319) Pulse Rate: 83 (03/02 2319)  Labs: Recent Labs    11/02/19 1534 11/02/19 1540 11/02/19 1540 11/03/19 0024 11/03/19 0024 11/03/19 0715 11/03/19 1547 11/03/19 2257  HGB 18.2*  --   --  17.0  --   --   --   --   HCT 53.5*  --   --  50.5  --   --   --   --   PLT 155  --   --  145*  --   --   --   --   APTT  --  33   < > 54*   < > 55* 53* 58*  LABPROT  --  20.2*  --   --   --   --   --   --   INR  --  1.7*  --   --   --   --   --   --   HEPARINUNFRC  --  2.56*  --   --   --   --   --   --   CREATININE 0.80  --   --  0.66  --   --   --   --    < > = values in this interval not displayed.    Estimated Creatinine Clearance: 136.3 mL/min (by C-G formula based on SCr of 0.66 mg/dL).   Medical History: Past Medical History:  Diagnosis Date  . Arthritis   . Hx of blood clots   . 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 Patient received thrombolysis with tPA and mechanical thrombectomy and started on Xarelto after having been on heparin. Patient presents back with pain to right knee and groin. Ultrasound of the right lower extremity showed extensive DVT throughout vasculature. Per EDP consult with Vascular, start heparin drip. Pharmacy consulted. Patient reports compliance with Xarelto since recent discharge with last dose 3/1 at ~ 0700.  3/2 0024 aPTT 54, subtherapeutic.  H/H ok, PLTs dropped slightly.  Will rebolus with 1000 units x 1 and increase infusion to 1650 units/hr  3/2 0715 aPTT 55, subtherapeutic. Will rebolus with 1200  units x 1 and increase infusion to 1750 units/hr  Goal of Therapy:  APTT 66-102 seconds Heparin level 0.3-0.7 units/ml Monitor platelets by anticoagulation protocol: Yes   Plan:  3/2 @ 1547 aPTT 53, subtherapeutic. Will rebolus with 1200 units x 1 and increase infusion to 1950 units/hr. Platelet level has slightly dropped, however it looks at though patient as a history of low platelet counts.   3/2 2257 HL 58, subtherapeutic.  Will rebolus with 1200 units x 1 and increase infusion to 2150 units/hr.  Recheck aPTT 6 hours after rate increase.  Will recheck aPTT in am and continue to dose off of these levels until correlation is found with aPTT and HL, then will transition to HL dosing. Continue to monitor H&H and platelets with daily labs.  3/26, PharmD Clinical Pharmacist 11/03/2019 11:47 PM

## 2019-11-03 NOTE — TOC Initial Note (Signed)
Transition of Care [facility]) - Initial/Assessment Note    Patient Details  Name: Matthew [name] MRN: [id number] Date of Birth: 1971/09/03  Transition of Care [facility]) CM/SW Contact:    Chapman Fitch, RN Phone Number: 11/03/2019, 1:46 PM  Clinical Narrative:                 Patient admitted from home with DVT Patient lives at home with wife  Current with PCP at [facility] .  Patient states "I don't like it there, it's just a bunch of smart mouth people that work over there".  RNCN discussed options of sliding scale clinics.  Patient states he would not be able to afford a co pay at the sliding scale, so he will stay at [facility]    Patient obtains medications from [facility] .  Patient confirms that he has his medications in the home.    Patient has a long history of noncompliance with xarelto.  Patient states "I've been taking it like i'm supposed to this time".  When RNCM inquired about why patient stops taking the medications ie barriers, if it makes him feel bad he states "no I just don't take it because I don't want to".  RNCM discussed risks and potential negative outcomes of not taking medications as prescribed.  Patient was disinterested.   Expected Discharge Plan: Home/Self Care     Patient Goals and CMS Choice        Expected Discharge Plan and Services Expected Discharge Plan: Home/Self Care   Discharge Planning Services: CM Consult   Living arrangements for the past 2 months: Single Family Home                                      Prior Living Arrangements/Services Living arrangements for the past 2 months: Single Family Home Lives with:: Spouse Patient language and need for interpreter reviewed:: Yes        Need for Family Participation in Patient Care: No (Comment) Care giver support system in place?: Yes (comment)      Activities of Daily Living Home Assistive Devices/Equipment: None ADL Screening (condition at time of  admission) Patient's cognitive ability adequate to safely complete daily activities?: Yes Is the patient deaf or have difficulty hearing?: No Does the patient have difficulty seeing, even when wearing glasses/contacts?: No Does the patient have difficulty concentrating, remembering, or making decisions?: No Patient able to express need for assistance with ADLs?: Yes Does the patient have difficulty dressing or bathing?: No Independently performs ADLs?: Yes (appropriate for developmental age) Does the patient have difficulty walking or climbing stairs?: No Weakness of Legs: Right Weakness of Arms/Hands: None  Permission Sought/Granted                  Emotional Assessment     Affect (typically observed): Blunt Orientation: : Oriented to Self, Oriented to Place, Oriented to  Time, Oriented to Situation   Psych Involvement: No (comment)  Admission diagnosis:  DVT (deep venous thrombosis) (HCC) [I82.409] DVT of deep femoral vein, right (HCC) [I82.411] Patient Active Problem List   Diagnosis Date Noted  . Tobacco abuse 10/29/2019  . Prediabetes 04/25/2019  . Wheezing on auscultation 04/25/2019  . Ingrown toenail of right foot 04/23/2019  . Atrial fibrillation (HCC) 03/19/2019  . DVT (deep venous thrombosis) (HCC) 03/19/2019  . Bilateral pneumonia 03/11/2019  . Acute DVT (deep venous  thrombosis) ([place]) 02/17/2019  . DVT of lower extremity (deep venous thrombosis) ([place]) 11/19/2018   PCP:  Langston Reusing, NP Pharmacy:   [facility] [place] [address] [place] [address] Phone: [phone] Fax: [phone]     Social Determinants of Health (SDOH) Interventions    Readmission Risk Interventions Readmission Risk Prevention Plan 11/03/2019 11/20/2018  Transportation Screening Complete Complete  PCP or Specialist Appt within 3-5 Days - (No Data)  [place] or [place] - (No Data)  Social Work Consult  for [place] Planning/Counseling - (No Data)  Palliative Care Screening Not Applicable Not Applicable  Medication Review Press photographer) Complete Complete

## 2019-11-03 NOTE — Progress Notes (Signed)
Cliffton Asters, NP notified that CCMD called with a pair of PVCs; PR 8/10; too soon for pain medications; asymptomatic. New order written to decrease Morphine interval. Windy Carina, RN 4:06 AM 11/03/2019

## 2019-11-03 NOTE — Progress Notes (Addendum)
ANTICOAGULATION CONSULT NOTE  Pharmacy Consult for heparin Indication: DVT  No Known Allergies  Patient Measurements: Height: 5\' 7"  (170.2 cm) Weight: 257 lb (116.6 kg) IBW/kg (Calculated) : 66.1 Heparin Dosing Weight: 92 kg  Vital Signs: Temp: 99.6 F (37.6 C) (03/02 0431) BP: 123/96 (03/02 0431) Pulse Rate: 95 (03/02 0519)  Labs: Recent Labs    11/02/19 1534 11/02/19 1540 11/03/19 0024 11/03/19 0715  HGB 18.2*  --  17.0  --   HCT 53.5*  --  50.5  --   PLT 155  --  145*  --   APTT  --  33 54* 55*  LABPROT  --  20.2*  --   --   INR  --  1.7*  --   --   HEPARINUNFRC  --  2.56*  --   --   CREATININE 0.80  --  0.66  --     Estimated Creatinine Clearance: 136.3 mL/min (by C-G formula based on SCr of 0.66 mg/dL).   Medical History: Past Medical History:  Diagnosis Date  . Arthritis   . Hx of blood clots   . 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 Patient received thrombolysis with tPA and mechanical thrombectomy and started on Xarelto after having been on heparin. Patient presents back with pain to right knee and groin. Ultrasound of the right lower extremity showed extensive DVT throughout vasculature. Per EDP consult with Vascular, start heparin drip. Pharmacy consulted. Patient reports compliance with Xarelto since recent discharge with last dose 3/1 at ~ 0700.  3/2 0024 aPTT 54, subtherapeutic.  H/H ok, PLTs dropped slightly.  Will rebolus with 1000 units x 1 and increase infusion to 1650 units/hr   Goal of Therapy:  APTT 66-102 seconds Heparin level 0.3-0.7 units/ml Monitor platelets by anticoagulation protocol: Yes   Plan:  3/2 0715 aPTT 55, subtherapeutic. Will rebolus with 1200 units x 1 and increase infusion to 1750 units/hr. Platelet level has slightly dropped, however it looks at though patient as a history of low platelet counts.    Will recheck aPTT in 6 hours and continue to dose off of these levels until correlation is found with aPTT and HL, then will transition to HL dosing. Continue to monitor H&H and platelets with daily labs.  3/26, PharmD 11/03/2019,7:58 AM

## 2019-11-03 NOTE — Progress Notes (Signed)
PROGRESS NOTE    Matthew [name]  XTK:[id number] DOB: 18-Jun-1971 DOA: 11/02/2019 PCP: Patient, No Pcp Per      Assessment & Plan:   Active Problems:   DVT (deep venous thrombosis) (HCC)  Recurrent RLE DVT: here last week w/ RLE s/p thrombectomy & continued on xarelto. Pt has been on xarelto for many years evidently. Hx of IVC filter. Consider possibility of failed xarelto and would recommend coumadin w/ lovenox bridge considering pt's BMI but likely that pt would not be compliant. Previous hypercoagulable work-up is pending. Etiology unclear. Denies long plane/car rides, denies sedentary lifestyle. Hx of non-compliance. Vascular surgery following & recs apprec. Will need a repeat thrombectomy as per vascular surgery. Tylenol, tramadol & morphine prn for pain  Morbid obesity: BMI 40.2. Would benefit greatly from weight loss.   Pre-DM: would benefit from weight loss. Carb modified diet but pt refused and requested a regular diet   Hypomagnesemia: mg sulfate given. Will continue to monitor   Leukocytosis: likely reactive. Will continue to monitor  A. fib: PAF vs chronic. Continue on tele. Not on any rate controlling medications  Thrombocytopenia: etiology unclear. Will continue to monitor   Hyperbilirubinemia: etiology unclear, trending up. Will continue to monitor   DVT prophylaxis: IV heparin drip Code Status: full  Family Communication: Disposition Plan:    Consultants:   Vascular surgery   Procedures:   Antimicrobials:   Subjective: Pt c/o being hungry   Objective: Vitals:   11/03/19 0431 11/03/19 0519 11/03/19 0727 11/03/19 0728  BP: (!) 123/96     Pulse: (!) 113 95    Resp: 20     Temp: 99.6 F (37.6 C)     TempSrc:      SpO2: (!) 83%  (!) 84% 93%  Weight:      Height:        Intake/Output Summary (Last 24 hours) at 11/03/2019 0800 Last data filed at 11/03/2019 0431 Gross per 24 hour  Intake 131.63 ml  Output 200 ml  Net -68.37 ml   Filed  Weights   11/02/19 1352  Weight: 116.6 kg    Examination:  General exam: Appears calm and comfortable. Morbidly obese Respiratory system: diminished breath sounds b/l. No rales Cardiovascular system: S1 & S2 +. No rubs, gallops or clicks. B/l LE 2+ pitting edema Gastrointestinal system: Abdomen is obese, soft and nontender.  Hypoactive bowel sounds heard. Central nervous system: Alert and oriented. Moves all 4 extremities  Psychiatry: Judgement and insight appear normal. Agitated and frustrated     Data Reviewed: I have personally reviewed following labs and imaging studies  CBC: Recent Labs  Lab 10/29/19 0839 10/30/19 0716 11/02/19 1534 11/03/19 0024  WBC 10.2 9.8 13.7* 13.7*  NEUTROABS  --   --  9.4*  --   HGB 19.0* 17.7* 18.2* 17.0  HCT 56.9* 53.7* 53.5* 50.5  MCV 99.5 102.3* 98.0 98.8  PLT 128* 125* 155 145*   Basic Metabolic Panel: Recent Labs  Lab 10/29/19 0839 10/30/19 0716 11/02/19 1534 11/03/19 0024  NA 137 134* 137 137  K 4.4 4.8 4.0 4.2  CL 98 96* 97* 99  CO2 30 30 30 31   GLUCOSE 114* 109* 109* 112*  BUN 12 16 9 10   CREATININE 0.67 0.89 0.80 0.66  CALCIUM 8.9 8.6* 8.8* 8.5*  MG  --   --   --  1.5*   GFR: Estimated Creatinine Clearance: 136.3 mL/min (by C-G formula based on SCr of 0.66 mg/dL). Liver Function Tests: Recent  Labs  Lab 10/29/19 0839 11/02/19 1534  AST 22 21  ALT 15 13  ALKPHOS 151* 129*  BILITOT 1.4* 2.1*  PROT 8.4* 7.7  ALBUMIN 3.5 3.0*   No results for input(s): LIPASE, AMYLASE in the last 168 hours. No results for input(s): AMMONIA in the last 168 hours. Coagulation Profile: Recent Labs  Lab 10/29/19 0839 11/02/19 1540  INR 1.1 1.7*   Cardiac Enzymes: No results for input(s): CKTOTAL, CKMB, CKMBINDEX, TROPONINI in the last 168 hours. BNP (last 3 results) No results for input(s): PROBNP in the last 8760 hours. HbA1C: No results for input(s): HGBA1C in the last 72 hours. CBG: No results for input(s): GLUCAP in the  last 168 hours. Lipid Profile: No results for input(s): CHOL, HDL, LDLCALC, TRIG, CHOLHDL, LDLDIRECT in the last 72 hours. Thyroid Function Tests: No results for input(s): TSH, T4TOTAL, FREET4, T3FREE, THYROIDAB in the last 72 hours. Anemia Panel: No results for input(s): VITAMINB12, FOLATE, FERRITIN, TIBC, IRON, RETICCTPCT in the last 72 hours. Sepsis Labs: Recent Labs  Lab 11/02/19 1534  LATICACIDVEN 1.2    Recent Results (from the past 240 hour(s))  Respiratory Panel by RT PCR (Flu A&B, Covid) - Nasopharyngeal Swab     Status: None   Collection Time: 10/29/19 10:09 AM   Specimen: Nasopharyngeal Swab  Result Value Ref Range Status   SARS Coronavirus 2 by RT PCR NEGATIVE NEGATIVE Final    Comment: (NOTE) SARS-CoV-2 target nucleic acids are NOT DETECTED. The SARS-CoV-2 RNA is generally detectable in upper respiratoy specimens during the acute phase of infection. The lowest concentration of SARS-CoV-2 viral copies this assay can detect is 131 copies/mL. A negative result does not preclude SARS-Cov-2 infection and should not be used as the sole basis for treatment or other patient management decisions. A negative result may occur with  improper specimen collection/handling, submission of specimen other than nasopharyngeal swab, presence of viral mutation(s) within the areas targeted by this assay, and inadequate number of viral copies (<131 copies/mL). A negative result must be combined with clinical observations, patient history, and epidemiological information. The expected result is Negative. Fact Sheet for Patients:  https://www.moore.com/ Fact Sheet for Healthcare Providers:  https://www.young.biz/ This test is not yet ap proved or cleared by the [place] FDA and  has been authorized for detection and/or diagnosis of SARS-CoV-2 by FDA under an Emergency Use Authorization (EUA). This EUA will remain  in effect (meaning this test  can be used) for the duration of the COVID-19 declaration under Section 564(b)(1) of the Act, 21 U.S.C. section 360bbb-3(b)(1), unless the authorization is terminated or revoked sooner.    Influenza A by PCR NEGATIVE NEGATIVE Final   Influenza B by PCR NEGATIVE NEGATIVE Final    Comment: (NOTE) The Xpert Xpress SARS-CoV-2/FLU/RSV assay is intended as an aid in  the diagnosis of influenza from Nasopharyngeal swab specimens and  should not be used as a sole basis for treatment. Nasal washings and  aspirates are unacceptable for Xpert Xpress SARS-CoV-2/FLU/RSV  testing. Fact Sheet for Patients: https://www.moore.com/ Fact Sheet for Healthcare Providers: https://www.young.biz/ This test is not yet approved or cleared by the [place] FDA and  has been authorized for detection and/or diagnosis of SARS-CoV-2 by  FDA under an Emergency Use Authorization (EUA). This EUA will remain  in effect (meaning this test can be used) for the duration of the  Covid-19 declaration under Section 564(b)(1) of the Act, 21  U.S.C. section 360bbb-3(b)(1), unless the authorization is  terminated or revoked.  Performed at [facility], [address] [place], [place] [address]          Radiology Studies: US Venous Img Lower Unilateral Right  Result Date: 11/02/2019 CLINICAL DATA:  Pain, swelling the right lower extremity with thrombectomy 10/30/2019 EXAM: RIGHT LOWER EXTREMITY VENOUS DOPPLER ULTRASOUND TECHNIQUE: Gray-scale sonography with compression, as well as color and duplex ultrasound, were performed to evaluate the deep venous system(s) from the level of the common femoral vein through the popliteal and proximal calf veins. COMPARISON:  Ultrasound 10/29/2019 FINDINGS: VENOUS Echogenic, noncompressible largely occlusive thrombus is seen within the common femoral vein, saphenofemoral junction, profundus femoral vein, femoral vein, and popliteal vein.  The calf veins are poorly visualized secondary to soft tissue swelling and edematous changes. Waveforms of clear largely diminished with lack of phasicity and minimal response to augmentation. Additional filling defect seen in the greater saphenous vein at the level of the calf and thigh. Limited views of the contralateral common femoral vein are unremarkable. OTHER None. Limitations: Edematous changes of calf result in difficulty assessing the calf veins for thrombus. IMPRESSION: Instance of deep venous thrombosis extending throughout the vasculature from the right common femoral vein to the level of popliteal vein. Additional thrombus seen throughout the superficial greater saphenous vein as well. More distal evaluation the calf veins is limited due to edematous changes of the lower extremity. Critical Value/emergent results were called by telephone at the time of interpretation on 11/02/2019 at 3:33 pm to provider CARI TRIPLETT , who verbally acknowledged these results. Electronically Signed   By: Lovena Le M.D.   On: 11/02/2019 15:34        Scheduled Meds: . sodium chloride flush  3 mL Intravenous Q12H   Continuous Infusions: . heparin 1,650 Units/hr (11/03/19 0624)  . magnesium sulfate bolus IVPB       LOS: 1 day    Time spent: 33 mins     Wyvonnia Dusky, MD [facility] Pager [phone]  If 7PM-7AM, please contact night-coverage [email or web address] 11/03/2019, 8:00 AM

## 2019-11-03 NOTE — Progress Notes (Signed)
ANTICOAGULATION CONSULT NOTE  Pharmacy Consult for heparin Indication: DVT  No Known Allergies  Patient Measurements: Height: 5\' 7"  (170.2 cm) Weight: 257 lb (116.6 kg) IBW/kg (Calculated) : 66.1 Heparin Dosing Weight: 92 kg  Vital Signs: Temp: 98 F (36.7 C) (03/02 1204) Temp Source: Oral (03/02 1204) BP: 101/69 (03/02 1204) Pulse Rate: 78 (03/02 1204)  Labs: Recent Labs    11/02/19 1534 11/02/19 1540 11/02/19 1540 11/03/19 0024 11/03/19 0715 11/03/19 1547  HGB 18.2*  --   --  17.0  --   --   HCT 53.5*  --   --  50.5  --   --   PLT 155  --   --  145*  --   --   APTT  --  33   < > 54* 55* 53*  LABPROT  --  20.2*  --   --   --   --   INR  --  1.7*  --   --   --   --   HEPARINUNFRC  --  2.56*  --   --   --   --   CREATININE 0.80  --   --  0.66  --   --    < > = values in this interval not displayed.    Estimated Creatinine Clearance: 136.3 mL/min (by C-G formula based on SCr of 0.66 mg/dL).   Medical History: Past Medical History:  Diagnosis Date  . Arthritis   . Hx of blood clots   . 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 Patient received thrombolysis with tPA and mechanical thrombectomy and started on Xarelto after having been on heparin. Patient presents back with pain to right knee and groin. Ultrasound of the right lower extremity showed extensive DVT throughout vasculature. Per EDP consult with Vascular, start heparin drip. Pharmacy consulted. Patient reports compliance with Xarelto since recent discharge with last dose 3/1 at ~ 0700.  3/2 0024 aPTT 54, subtherapeutic.  H/H ok, PLTs dropped slightly.  Will rebolus with 1000 units x 1 and increase infusion to 1650 units/hr  3/2 0715 aPTT 55, subtherapeutic. Will rebolus with 1200 units x 1 and increase infusion to 1750 units/hr  Goal of Therapy:  APTT 66-102 seconds Heparin level 0.3-0.7  units/ml Monitor platelets by anticoagulation protocol: Yes   Plan:  3/2 @ 1547 aPTT 53, subtherapeutic. Will rebolus with 1200 units x 1 and increase infusion to 1950 units/hr. Platelet level has slightly dropped, however it looks at though patient as a history of low platelet counts.   Will recheck aPTT in 6 hours and continue to dose off of these levels until correlation is found with aPTT and HL, then will transition to HL dosing. Continue to monitor H&H and platelets with daily labs.  3/26, PharmD, BCPS Clinical Pharmacist 11/03/2019 4:27 PM

## 2019-11-04 ENCOUNTER — Inpatient Hospital Stay: Payer: Self-pay

## 2019-11-04 ENCOUNTER — Encounter: Payer: Self-pay | Admitting: Internal Medicine

## 2019-11-04 ENCOUNTER — Other Ambulatory Visit (INDEPENDENT_AMBULATORY_CARE_PROVIDER_SITE_OTHER): Payer: Self-pay | Admitting: Vascular Surgery

## 2019-11-04 DIAGNOSIS — I82401 Acute embolism and thrombosis of unspecified deep veins of right lower extremity: Secondary | ICD-10-CM

## 2019-11-04 DIAGNOSIS — I480 Paroxysmal atrial fibrillation: Secondary | ICD-10-CM

## 2019-11-04 LAB — BASIC METABOLIC PANEL
Anion gap: 8 (ref 5–15)
BUN: 13 mg/dL (ref 6–20)
CO2: 32 mmol/L (ref 22–32)
Calcium: 8.6 mg/dL — ABNORMAL LOW (ref 8.9–10.3)
Chloride: 93 mmol/L — ABNORMAL LOW (ref 98–111)
Creatinine, Ser: 0.67 mg/dL (ref 0.61–1.24)
GFR calc Af Amer: >60 mL/min (ref >60)
GFR calc non Af Amer: >60 mL/min (ref >60)
Glucose, Bld: 121 mg/dL — ABNORMAL HIGH (ref 70–99)
Potassium: 4.8 mmol/L (ref 3.5–5.1)
Sodium: 133 mmol/L — ABNORMAL LOW (ref 135–145)

## 2019-11-04 LAB — APTT
aPTT: 67 seconds — ABNORMAL HIGH (ref 24–36)
aPTT: 57 seconds — ABNORMAL HIGH (ref 24–36)
aPTT: 62 seconds — ABNORMAL HIGH (ref 24–36)

## 2019-11-04 LAB — CBC
HCT: 48.2 % (ref 39.0–52.0)
Hemoglobin: 16 g/dL (ref 13.0–17.0)
MCH: 33.1 pg (ref 26.0–34.0)
MCHC: 33.2 g/dL (ref 30.0–36.0)
MCV: 99.8 fL (ref 80.0–100.0)
Platelets: 153 10*3/uL (ref 150–400)
RBC: 4.83 MIL/uL (ref 4.22–5.81)
RDW: 13.9 % (ref 11.5–15.5)
WBC: 11.9 10*3/uL — ABNORMAL HIGH (ref 4.0–10.5)
nRBC: 0 % (ref 0.0–0.2)

## 2019-11-04 LAB — GLUCOSE, CAPILLARY: Glucose-Capillary: 106 mg/dL — ABNORMAL HIGH (ref 70–99)

## 2019-11-04 LAB — PROTIME-INR
INR: 1.2 (ref 0.8–1.2)
Prothrombin Time: 15 seconds (ref 11.4–15.2)

## 2019-11-04 MED ORDER — OXYCODONE HCL 5 MG PO TABS
10.0000 mg | ORAL_TABLET | ORAL | Status: DC | PRN
  Administered 2019-11-05: 10 mg via ORAL
  Filled 2019-11-04: qty 2

## 2019-11-04 MED ORDER — IPRATROPIUM-ALBUTEROL 0.5-2.5 (3) MG/3ML IN SOLN: 3.0000 mL | Freq: Four times a day (QID) | RESPIRATORY_TRACT | Status: DC | PRN

## 2019-11-04 MED ORDER — IOHEXOL 350 MG/ML SOLN
125.0000 mL | Freq: Once | INTRAVENOUS | Status: AC | PRN
  Administered 2019-11-04: 18:00:00 125 mL via INTRAVENOUS

## 2019-11-04 MED ORDER — HYDROCORTISONE 1 % EX CREA
1.0000 "application " | TOPICAL_CREAM | Freq: Three times a day (TID) | CUTANEOUS | Status: DC | PRN
  Filled 2019-11-04: qty 28

## 2019-11-04 MED ORDER — HYDROMORPHONE HCL 1 MG/ML IJ SOLN
0.5000 mg | Freq: Once | INTRAMUSCULAR | Status: AC
  Administered 2019-11-04: 0.5 mg via INTRAVENOUS
  Filled 2019-11-04: qty 0.5

## 2019-11-04 MED ORDER — SODIUM CHLORIDE 0.9 % IV BOLUS
250.0000 mL | Freq: Once | INTRAVENOUS | Status: AC
  Administered 2019-11-04: 250 mL via INTRAVENOUS

## 2019-11-04 MED ORDER — HYDROMORPHONE HCL 1 MG/ML IJ SOLN
0.5000 mg | INTRAMUSCULAR | Status: DC | PRN
  Administered 2019-11-04 – 2019-11-05 (×4): 0.5 mg via INTRAVENOUS
  Filled 2019-11-04 (×5): qty 0.5

## 2019-11-04 MED ORDER — OXYCODONE HCL 5 MG PO TABS
5.0000 mg | ORAL_TABLET | ORAL | Status: DC | PRN
  Administered 2019-11-04 (×2): 5 mg via ORAL
  Filled 2019-11-04 (×2): qty 1

## 2019-11-04 MED ORDER — HYDROMORPHONE HCL 1 MG/ML IJ SOLN: 1.0000 mg | Freq: Once | INTRAMUSCULAR | Status: DC

## 2019-11-04 MED ORDER — SODIUM CHLORIDE 0.9 % IV SOLN: INTRAVENOUS | Status: DC

## 2019-11-04 MED ORDER — HEPARIN BOLUS VIA INFUSION
1200.0000 [IU] | Freq: Once | INTRAVENOUS | Status: AC
  Administered 2019-11-04: 1200 [IU] via INTRAVENOUS
  Filled 2019-11-04: qty 1200

## 2019-11-04 NOTE — Progress Notes (Signed)
ANTICOAGULATION CONSULT NOTE  Pharmacy Consult for heparin Indication: DVT  No Known Allergies  Patient Measurements: Height: 5\' 7"  (170.2 cm) Weight: 257 lb (116.6 kg) IBW/kg (Calculated) : 66.1 Heparin Dosing Weight: 92 kg  Vital Signs: Temp: 99.3 F (37.4 C) (03/03 0509) Temp Source: Oral (03/03 0509) BP: 97/69 (03/03 0509) Pulse Rate: 79 (03/03 0509)  Labs: Recent Labs    11/02/19 1534 11/02/19 1534 11/02/19 1540 11/03/19 0024 11/03/19 0715 11/03/19 2257 11/04/19 0614 11/04/19 1156  HGB 18.2*   < >  --  17.0  --   --  16.0  --   HCT 53.5*  --   --  50.5  --   --  48.2  --   PLT 155  --   --  145*  --   --  153  --   APTT  --    < > 33 54*   < > 58* 67* 57*  LABPROT  --   --  20.2*  --   --   --   --  15.0  INR  --   --  1.7*  --   --   --   --  1.2  HEPARINUNFRC  --   --  2.56*  --   --   --   --   --   CREATININE 0.80  --   --  0.66  --   --  0.67  --    < > = values in this interval not displayed.    Estimated Creatinine Clearance: 136.3 mL/min (by C-G formula based on SCr of 0.67 mg/dL).   Medical History: Past Medical History:  Diagnosis Date  . Arthritis   . Hx of blood clots   . 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 Patient received thrombolysis with tPA and mechanical thrombectomy and started on Xarelto after having been on heparin. Patient presents back with pain to right knee and groin. Ultrasound of the right lower extremity showed extensive DVT throughout vasculature. Per EDP consult with Vascular, start heparin drip. Pharmacy consulted. Patient reports compliance with Xarelto since recent discharge with last dose 3/1 at ~ 0700.  3/2 0024 aPTT 54, subtherapeutic.  H/H ok, PLTs dropped slightly.  Will rebolus with 1000 units x 1 and increase infusion to 1650 units/hr  3/2 0715 aPTT 55, subtherapeutic. Will rebolus with 1200 units  x 1 and increase infusion to 1750 units/hr  3/2 @ 1547 aPTT 53, subtherapeutic. Will rebolus with 1200 units x 1 and increase infusion to 1950 units/hr. Platelet level has slightly dropped, however it looks at though patient as a history of low platelet counts.   3/2 2257 aPTT 58, subtherapeutic.  Will rebolus with 1200 units x 1 and increase infusion to 2150 units/hr.  Recheck aPTT 6 hours after rate increase.  3/3 0614 aPTT 67, therapeutic x 1.    Goal of Therapy:  APTT 66-102 seconds Heparin level 0.3-0.7 units/ml Monitor platelets by anticoagulation protocol: Yes   Plan:  3/3 1156 APTT 57 sec. Subtherapeutic. Will rebolus with 1200 units x 1, and will increase rate to 2350 units/hr. Will recheck APTT in 6 hours.   Will recheck aPTT in am and continue to dose off of these levels until correlation is found with aPTT and HL, then will transition to HL dosing. Continue to monitor H&H and platelets with  daily labs.  Pearla Dubonnet, PharmD Clinical Pharmacist 11/04/2019 1:12 PM

## 2019-11-04 NOTE — Progress Notes (Signed)
ANTICOAGULATION CONSULT NOTE  Pharmacy Consult for heparin Indication: DVT  No Known Allergies  Patient Measurements: Height: 5\' 7"  (170.2 cm) Weight: 257 lb (116.6 kg) IBW/kg (Calculated) : 66.1 Heparin Dosing Weight: 92 kg  Vital Signs: Temp: 99.3 F (37.4 C) (03/03 0509) Temp Source: Oral (03/03 0509) BP: 97/69 (03/03 0509) Pulse Rate: 79 (03/03 0509)  Labs: Recent Labs    11/02/19 1534 11/02/19 1534 11/02/19 1540 11/03/19 0024 11/03/19 0715 11/03/19 1547 11/03/19 2257 11/04/19 0614  HGB 18.2*   < >  --  17.0  --   --   --  16.0  HCT 53.5*  --   --  50.5  --   --   --  48.2  PLT 155  --   --  145*  --   --   --  153  APTT  --    < > 33 54*   < > 53* 58* 67*  LABPROT  --   --  20.2*  --   --   --   --   --   INR  --   --  1.7*  --   --   --   --   --   HEPARINUNFRC  --   --  2.56*  --   --   --   --   --   CREATININE 0.80  --   --  0.66  --   --   --  0.67   < > = values in this interval not displayed.    Estimated Creatinine Clearance: 136.3 mL/min (by C-G formula based on SCr of 0.67 mg/dL).   Medical History: Past Medical History:  Diagnosis Date  . Arthritis   . Hx of blood clots   . 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 Patient received thrombolysis with tPA and mechanical thrombectomy and started on Xarelto after having been on heparin. Patient presents back with pain to right knee and groin. Ultrasound of the right lower extremity showed extensive DVT throughout vasculature. Per EDP consult with Vascular, start heparin drip. Pharmacy consulted. Patient reports compliance with Xarelto since recent discharge with last dose 3/1 at ~ 0700.  3/2 0024 aPTT 54, subtherapeutic.  H/H ok, PLTs dropped slightly.  Will rebolus with 1000 units x 1 and increase infusion to 1650 units/hr  3/2 0715 aPTT 55, subtherapeutic. Will rebolus with 1200  units x 1 and increase infusion to 1750 units/hr  Goal of Therapy:  APTT 66-102 seconds Heparin level 0.3-0.7 units/ml Monitor platelets by anticoagulation protocol: Yes   Plan:  3/2 @ 1547 aPTT 53, subtherapeutic. Will rebolus with 1200 units x 1 and increase infusion to 1950 units/hr. Platelet level has slightly dropped, however it looks at though patient as a history of low platelet counts.   3/2 2257 aPTT 58, subtherapeutic.  Will rebolus with 1200 units x 1 and increase infusion to 2150 units/hr.  Recheck aPTT 6 hours after rate increase.  3/3 0614 aPTT 67, therapeutic x 1.  CBC ok. Continue current rate and recheck aPTT in 6 hrs to confirm.  Will recheck aPTT in am and continue to dose off of these levels until correlation is found with aPTT and HL, then will transition to HL dosing. Continue to monitor H&H and platelets with daily labs.  [id number], PharmD Clinical Pharmacist 11/04/2019 7:22 AM

## 2019-11-04 NOTE — Progress Notes (Signed)
ANTICOAGULATION CONSULT NOTE  Pharmacy Consult for heparin Indication: DVT  No Known Allergies  Patient Measurements: Height: 5\' 7"  (170.2 cm) Weight: 257 lb (116.6 kg) IBW/kg (Calculated) : 66.1 Heparin Dosing Weight: 92 kg  Vital Signs: Temp: 100 F (37.8 C) (03/03 2009) Temp Source: Oral (03/03 2009) BP: 104/72 (03/03 2009) Pulse Rate: 89 (03/03 2009)  Labs: Recent Labs    11/02/19 1534 11/02/19 1534 11/02/19 1540 11/03/19 0024 11/03/19 0715 11/04/19 0614 11/04/19 1156 11/04/19 2021  HGB 18.2*   < >  --  17.0  --  16.0  --   --   HCT 53.5*  --   --  50.5  --  48.2  --   --   PLT 155  --   --  145*  --  153  --   --   APTT  --    < > 33 54*   < > 67* 57* 62*  LABPROT  --   --  20.2*  --   --   --  15.0  --   INR  --   --  1.7*  --   --   --  1.2  --   HEPARINUNFRC  --   --  2.56*  --   --   --   --   --   CREATININE 0.80  --   --  0.66  --  0.67  --   --    < > = values in this interval not displayed.    Estimated Creatinine Clearance: 136.3 mL/min (by C-G formula based on SCr of 0.67 mg/dL).   Medical History: Past Medical History:  Diagnosis Date  . Arthritis   . Hx of blood clots   . 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 Patient received thrombolysis with tPA and mechanical thrombectomy and started on Xarelto after having been on heparin. Patient presents back with pain to right knee and groin. Ultrasound of the right lower extremity showed extensive DVT throughout vasculature. Per EDP consult with Vascular, start heparin drip. Pharmacy consulted. Patient reports compliance with Xarelto since recent discharge with last dose 3/1 at ~ 0700.  3/2 0024 aPTT 54, subtherapeutic.  H/H ok, PLTs dropped slightly.  Will rebolus with 1000 units x 1 and increase infusion to 1650 units/hr  3/2 0715 aPTT 55, subtherapeutic. Will rebolus with 1200 units  x 1 and increase infusion to 1750 units/hr  3/2 @ 1547 aPTT 53, subtherapeutic. Will rebolus with 1200 units x 1 and increase infusion to 1950 units/hr. Platelet level has slightly dropped, however it looks at though patient as a history of low platelet counts.   3/2 2257 aPTT 58, subtherapeutic.  Will rebolus with 1200 units x 1 and increase infusion to 2150 units/hr.  Recheck aPTT 6 hours after rate increase.  3/3 0614 aPTT 67, therapeutic x 1.    Goal of Therapy:  APTT 66-102 seconds Heparin level 0.3-0.7 units/ml Monitor platelets by anticoagulation protocol: Yes   Plan:  3/3 1156 APTT 57 sec. Subtherapeutic. Will rebolus with 1200 units x 1, and will increase rate to 2350 units/hr. Will recheck APTT in 6 hours.   3/3: aPTT @ 2021 = 62 Will increase drip rate to 2450 units/hr and recheck aPTT 6 hrs after rate change.   Will recheck aPTT in am and continue to dose off of  these levels until correlation is found with aPTT and HL, then will transition to HL dosing. Continue to monitor H&H and platelets with daily labs.  Scherrie Gerlach, PharmD Clinical Pharmacist 11/04/2019 10:28 PM

## 2019-11-04 NOTE — Progress Notes (Signed)
Patient ID: Rajan Burgard, male   DOB: 06-14-1971, 49 y.o.   MRN: [id number] [facility] PROGRESS NOTE  Timoteo Carreiro UYQ:[id number] DOB: 1971-02-01 DOA: 11/02/2019 PCP: Rolm Gala, NP  HPI/Subjective: Patient in a lot of pain with his right lower extremity.  Prior to coming in the last time he had not been taking his Xarelto.  He had his thrombectomy and went home on the Xarelto.  He came back with severe pain and swelling in his right leg.  Now he can hardly move it.  Objective: Vitals:   11/03/19 2319 11/04/19 0509  BP: (!) 86/65 97/69  Pulse: 83 79  Resp: 20 20  Temp: 98.3 F (36.8 C) 99.3 F (37.4 C)  SpO2: 95% 93%    Intake/Output Summary (Last 24 hours) at 11/04/2019 1246 Last data filed at 11/04/2019 0600 Gross per 24 hour  Intake 1109.71 ml  Output 400 ml  Net 709.71 ml   Filed Weights   11/02/19 1352  Weight: 116.6 kg    ROS: Review of Systems  Constitutional: Negative for chills and fever.  Eyes: Negative for blurred vision.  Respiratory: Negative for cough and shortness of breath.   Cardiovascular: Negative for chest pain.  Gastrointestinal: Negative for abdominal pain, constipation, diarrhea, nausea and vomiting.  Genitourinary: Negative for dysuria.  Musculoskeletal: Positive for joint pain.  Neurological: Negative for dizziness and headaches.   Exam: Physical Exam  Constitutional: He is oriented to person, place, and time.  HENT:  Nose: No mucosal edema.  Mouth/Throat: No oropharyngeal exudate or posterior oropharyngeal edema.  Eyes: Conjunctivae and lids are normal.  Neck: Carotid bruit is not present.  Cardiovascular: S1 normal and S2 normal. Exam reveals no gallop.  No murmur heard. Respiratory: No respiratory distress. He has no wheezes. He has no rhonchi. He has no rales.  GI: Soft. Bowel sounds are normal. There is no abdominal tenderness.  Musculoskeletal:     Right knee: Swelling present.     Right ankle: Swelling present.      Left ankle: No swelling.  Lymphadenopathy:    He has no cervical adenopathy.  Neurological: He is alert and oriented to person, place, and time. No cranial nerve deficit.  Skin: Skin is warm. No rash noted. Nails show no clubbing.  Psychiatric: He has a normal mood and affect.      Data Reviewed: Basic Metabolic Panel: Recent Labs  Lab 10/29/19 0839 10/30/19 0716 11/02/19 1534 11/03/19 0024 11/04/19 0614  NA 137 134* 137 137 133*  K 4.4 4.8 4.0 4.2 4.8  CL 98 96* 97* 99 93*  CO2 30 30 30 31  32  GLUCOSE 114* 109* 109* 112* 121*  BUN 12 16 9 10 13   CREATININE 0.67 0.89 0.80 0.66 0.67  CALCIUM 8.9 8.6* 8.8* 8.5* 8.6*  MG  --   --   --  1.5*  --    Liver Function Tests: Recent Labs  Lab 10/29/19 0839 11/02/19 1534  AST 22 21  ALT 15 13  ALKPHOS 151* 129*  BILITOT 1.4* 2.1*  PROT 8.4* 7.7  ALBUMIN 3.5 3.0*   CBC: Recent Labs  Lab 10/29/19 0839 10/30/19 0716 11/02/19 1534 11/03/19 0024 11/04/19 0614  WBC 10.2 9.8 13.7* 13.7* 11.9*  NEUTROABS  --   --  9.4*  --   --   HGB 19.0* 17.7* 18.2* 17.0 16.0  HCT 56.9* 53.7* 53.5* 50.5 48.2  MCV 99.5 102.3* 98.0 98.8 99.8  PLT 128* 125* 155 145* 153  BNP (last 3 results) Recent Labs    03/10/19 1940  BNP 41.0     Recent Results (from the past 240 hour(s))  Respiratory Panel by RT PCR (Flu A&B, Covid) - Nasopharyngeal Swab     Status: None   Collection Time: 10/29/19 10:09 AM   Specimen: Nasopharyngeal Swab  Result Value Ref Range Status   SARS Coronavirus 2 by RT PCR NEGATIVE NEGATIVE Final    Comment: (NOTE) SARS-CoV-2 target nucleic acids are NOT DETECTED. The SARS-CoV-2 RNA is generally detectable in upper respiratoy specimens during the acute phase of infection. The lowest concentration of SARS-CoV-2 viral copies this assay can detect is 131 copies/mL. A negative result does not preclude SARS-Cov-2 infection and should not be used as the sole basis for treatment or other patient management  decisions. A negative result may occur with  improper specimen collection/handling, submission of specimen other than nasopharyngeal swab, presence of viral mutation(s) within the areas targeted by this assay, and inadequate number of viral copies (<131 copies/mL). A negative result must be combined with clinical observations, patient history, and epidemiological information. The expected result is Negative. Fact Sheet for Patients:  https://www.moore.com/ Fact Sheet for Healthcare Providers:  https://www.young.biz/ This test is not yet ap proved or cleared by the [place] FDA and  has been authorized for detection and/or diagnosis of SARS-CoV-2 by FDA under an Emergency Use Authorization (EUA). This EUA will remain  in effect (meaning this test can be used) for the duration of the COVID-19 declaration under Section 564(b)(1) of the Act, 21 U.S.C. section 360bbb-3(b)(1), unless the authorization is terminated or revoked sooner.    Influenza A by PCR NEGATIVE NEGATIVE Final   Influenza B by PCR NEGATIVE NEGATIVE Final    Comment: (NOTE) The Xpert Xpress SARS-CoV-2/FLU/RSV assay is intended as an aid in  the diagnosis of influenza from Nasopharyngeal swab specimens and  should not be used as a sole basis for treatment. Nasal washings and  aspirates are unacceptable for Xpert Xpress SARS-CoV-2/FLU/RSV  testing. Fact Sheet for Patients: https://www.moore.com/ Fact Sheet for Healthcare Providers: https://www.young.biz/ This test is not yet approved or cleared by the [place] FDA and  has been authorized for detection and/or diagnosis of SARS-CoV-2 by  FDA under an Emergency Use Authorization (EUA). This EUA will remain  in effect (meaning this test can be used) for the duration of the  Covid-19 declaration under Section 564(b)(1) of the Act, 21  U.S.C. section 360bbb-3(b)(1), unless the authorization  is  terminated or revoked. Performed at [facility], [address] [place], [place] [address]      Studies: US Venous Img Lower Unilateral Right  Result Date: 11/02/2019 CLINICAL DATA:  Pain, swelling the right lower extremity with thrombectomy 10/30/2019 EXAM: RIGHT LOWER EXTREMITY VENOUS DOPPLER ULTRASOUND TECHNIQUE: Gray-scale sonography with compression, as well as color and duplex ultrasound, were performed to evaluate the deep venous system(s) from the level of the common femoral vein through the popliteal and proximal calf veins. COMPARISON:  Ultrasound 10/29/2019 FINDINGS: VENOUS Echogenic, noncompressible largely occlusive thrombus is seen within the common femoral vein, saphenofemoral junction, profundus femoral vein, femoral vein, and popliteal vein. The calf veins are poorly visualized secondary to soft tissue swelling and edematous changes. Waveforms of clear largely diminished with lack of phasicity and minimal response to augmentation. Additional filling defect seen in the greater saphenous vein at the level of the calf and thigh. Limited views of the contralateral common femoral vein are unremarkable. OTHER None. Limitations:  Edematous changes of calf result in difficulty assessing the calf veins for thrombus. IMPRESSION: Instance of deep venous thrombosis extending throughout the vasculature from the right common femoral vein to the level of popliteal vein. Additional thrombus seen throughout the superficial greater saphenous vein as well. More distal evaluation the calf veins is limited due to edematous changes of the lower extremity. Critical Value/emergent results were called by telephone at the time of interpretation on 11/02/2019 at 3:33 pm to provider CARI TRIPLETT , who verbally acknowledged these results. Electronically Signed   By: Lovena Le M.D.   On: 11/02/2019 15:34    Scheduled Meds: . magnesium oxide  800 mg Oral BID  . nicotine  14 mg Transdermal Q24H  .  sodium chloride flush  3 mL Intravenous Q12H   Continuous Infusions: . [START ON 11/05/2019] sodium chloride    . heparin 2,150 Units/hr (11/04/19 0600)    Assessment/Plan:  1. Recurrent right lower extremity DVT.  Severe right leg pain and swelling, With inability to walk on it or put any pressure on it.  Vascular surgery to do another procedure tomorrow.  I discussed with the patient about changing blood thinners over to Coumadin and he says he will not take that.  The only thing he will take a Xarelto.  Since he was not taking the Xarelto prior to coming in the previous time, I am not sure if I can call this a Xarelto failure or not.  Oxycodone orally and Dilaudid IV as needed for moderate and severe pain. 2. Paroxysmal atrial fibrillation.  Rate controlled.  Not on any rate controlling medications.  Will go on blood thinner upon discharge. 3. Hypomagnesemia on oral magnesium 4. Impaired fasting glucose.  Last hemoglobin A1c 5.9.  Patient not a diabetic.  Code Status:     Code Status Orders  (From admission, onward)         Start     Ordered   11/02/19 1744  Full code  Continuous     11/02/19 1745        Code Status History    Date Active Date Inactive Code Status Order ID Comments User Context   10/29/2019 1239 10/31/2019 0131 Full Code [id number]  Ivor Costa, MD Inpatient   03/11/2019 0056 03/14/2019 2005 Full Code [id number]  Mayer Camel, NP ED   02/17/2019 2013 02/19/2019 1555 Full Code [id number]  Fritzi Mandes, MD ED   11/19/2018 2234 11/20/2018 1643 Full Code [id number]  Lance Coon, MD Inpatient   Advance Care Planning Activity     Family Communication: Spoke with spouse on the phone Disposition Plan: Patient likely to have a vascular procedure tomorrow.  Will need to see if he is able to move his leg and walk on it prior to disposition.  Consultants:  Vascular surgery  Time spent: 29 minutes  [place]

## 2019-11-04 NOTE — Progress Notes (Signed)
[facility] Daily Progress Note   Subjective: Patient with continued RLE pain and swelling. States increased pain from knee to groin.  Objective: Vitals:   11/03/19 0728 11/03/19 1204 11/03/19 2319 11/04/19 0509  BP:  101/69 (!) 86/65 97/69  Pulse:  78 83 79  Resp:  16 20 20   Temp:  98 F (36.7 C) 98.3 F (36.8 C) 99.3 F (37.4 C)  TempSrc:  Oral Oral Oral  SpO2: 93% 96% 95% 93%  Weight:      Height:        Intake/Output Summary (Last 24 hours) at 11/04/2019 1507 Last data filed at 11/04/2019 0600 Gross per 24 hour  Intake 800.65 ml  Output 400 ml  Net 400.65 ml   Physical Exam: A&Ox3, NAD CV: RRR Pulmonary: CTA Bilaterally Abdomen: Soft, Nontender, Nondistended Vascular:  Right Lower Extremity: Extremity is moderately edematous however still soft.  Unable to palpate pedal pulses due to edema however the foot is warm and there is good capillary refill.  There is a bluish tint to the foot.   Laboratory: CBC    Component Value Date/Time   WBC 11.9 (H) 11/04/2019 0614   HGB 16.0 11/04/2019 0614   HGB 17.8 (H) 09/10/2018 0930   HCT 48.2 11/04/2019 0614   HCT 51.3 (H) 09/10/2018 0930   PLT 153 11/04/2019 0614   PLT 233 09/10/2018 0930   BMET    Component Value Date/Time   NA 133 (L) 11/04/2019 0614   NA 143 09/10/2018 0930   K 4.8 11/04/2019 0614   CL 93 (L) 11/04/2019 0614   CO2 32 11/04/2019 0614   GLUCOSE 121 (H) 11/04/2019 0614   BUN 13 11/04/2019 0614   BUN 8 09/10/2018 0930   CREATININE 0.67 11/04/2019 0614   CALCIUM 8.6 (L) 11/04/2019 0614   GFRNONAA >60 11/04/2019 0614   GFRAA >60 11/04/2019 01/04/2020   Assessment/Planning: The patient is a 49 year old male with a known history of DVT requiring endovascular venous thrombectomy / lysis with IVC filter placement.  Patient presented to [facility] emergency department for recurrent right lower extremity DVT.  1) patient complaining of worsening right lower  extremity discomfort.  Notes his discomfort is from the knee proximally to the groin.  He is also complaining of bilateral knee pain. 2) physical exam is stable when compared to yesterday. 3) patient with a past medical history of paroxysmal atrial fibrillation.  Will order CTA with bilateral runoff to rule out any arterial insufficiency. 4) patient has been preoped to undergo a right lower extremity venous thrombectomy / lysis tomorrow a.m. 5) continue heparin  Discussed with Dr. [facility] Lagretta Loseke PA-C 11/04/2019 3:07 PM

## 2019-11-05 ENCOUNTER — Encounter
Admission: EM | Disposition: A | Discharge status: Home / Self Care | Payer: Self-pay | Source: Home / Self Care | Attending: Internal Medicine

## 2019-11-05 ENCOUNTER — Encounter: Payer: Self-pay | Admitting: Internal Medicine

## 2019-11-05 DIAGNOSIS — I82411 Acute embolism and thrombosis of right femoral vein: Secondary | ICD-10-CM

## 2019-11-05 DIAGNOSIS — K7469 Other cirrhosis of liver: Secondary | ICD-10-CM

## 2019-11-05 DIAGNOSIS — I82421 Acute embolism and thrombosis of right iliac vein: Secondary | ICD-10-CM

## 2019-11-05 DIAGNOSIS — R062 Wheezing: Secondary | ICD-10-CM

## 2019-11-05 HISTORY — PX: PERIPHERAL VASCULAR THROMBECTOMY: CATH118306

## 2019-11-05 LAB — BASIC METABOLIC PANEL
Anion gap: 4 — ABNORMAL LOW (ref 5–15)
BUN: 12 mg/dL (ref 6–20)
CO2: 36 mmol/L — ABNORMAL HIGH (ref 22–32)
Calcium: 8.7 mg/dL — ABNORMAL LOW (ref 8.9–10.3)
Chloride: 92 mmol/L — ABNORMAL LOW (ref 98–111)
Creatinine, Ser: 0.78 mg/dL (ref 0.61–1.24)
GFR calc Af Amer: >60 mL/min (ref >60)
GFR calc non Af Amer: >60 mL/min (ref >60)
Glucose, Bld: 120 mg/dL — ABNORMAL HIGH (ref 70–99)
Potassium: 4.6 mmol/L (ref 3.5–5.1)
Sodium: 132 mmol/L — ABNORMAL LOW (ref 135–145)

## 2019-11-05 LAB — APTT: aPTT: 71 seconds — ABNORMAL HIGH (ref 24–36)

## 2019-11-05 LAB — CBC
HCT: 46.8 % (ref 39.0–52.0)
Hemoglobin: 15.2 g/dL (ref 13.0–17.0)
MCH: 32.8 pg (ref 26.0–34.0)
MCHC: 32.5 g/dL (ref 30.0–36.0)
MCV: 101.1 fL — ABNORMAL HIGH (ref 80.0–100.0)
Platelets: 161 10*3/uL (ref 150–400)
RBC: 4.63 MIL/uL (ref 4.22–5.81)
RDW: 13.8 % (ref 11.5–15.5)
WBC: 10.7 10*3/uL — ABNORMAL HIGH (ref 4.0–10.5)
nRBC: 0 % (ref 0.0–0.2)

## 2019-11-05 LAB — MAGNESIUM: Magnesium: 1.8 mg/dL (ref 1.7–2.4)

## 2019-11-05 LAB — HEPARIN LEVEL (UNFRACTIONATED)
Heparin Unfractionated: 0.5 IU/mL (ref 0.30–0.70)
Heparin Unfractionated: 0.23 IU/mL — ABNORMAL LOW (ref 0.30–0.70)
Heparin Unfractionated: 0.57 IU/mL (ref 0.30–0.70)

## 2019-11-05 SURGERY — PERIPHERAL VASCULAR THROMBECTOMY
Anesthesia: Moderate Sedation | Laterality: Right

## 2019-11-05 MED ORDER — MIDAZOLAM HCL 2 MG/ML PO SYRP: 8.0000 mg | ORAL_SOLUTION | Freq: Once | ORAL | Status: DC | PRN

## 2019-11-05 MED ORDER — DIPHENHYDRAMINE HCL 50 MG/ML IJ SOLN: 50.0000 mg | Freq: Once | INTRAMUSCULAR | Status: DC | PRN

## 2019-11-05 MED ORDER — FAMOTIDINE 20 MG PO TABS: 40.0000 mg | ORAL_TABLET | Freq: Once | ORAL | Status: DC | PRN

## 2019-11-05 MED ORDER — MIDAZOLAM HCL 5 MG/5ML IJ SOLN
INTRAMUSCULAR | Status: AC
  Filled 2019-11-05: qty 5

## 2019-11-05 MED ORDER — SODIUM CHLORIDE 0.9 % IV SOLN: INTRAVENOUS | Status: DC

## 2019-11-05 MED ORDER — IPRATROPIUM-ALBUTEROL 0.5-2.5 (3) MG/3ML IN SOLN
RESPIRATORY_TRACT | Status: AC
  Filled 2019-11-05: qty 3

## 2019-11-05 MED ORDER — ALTEPLASE 2 MG IJ SOLR
INTRAMUSCULAR | Status: DC | PRN
  Administered 2019-11-05: 4 mg

## 2019-11-05 MED ORDER — CEFAZOLIN SODIUM-DEXTROSE 2-4 GM/100ML-% IV SOLN
INTRAVENOUS | Status: AC
  Filled 2019-11-05: qty 100

## 2019-11-05 MED ORDER — IPRATROPIUM-ALBUTEROL 0.5-2.5 (3) MG/3ML IN SOLN
3.0000 mL | Freq: Four times a day (QID) | RESPIRATORY_TRACT | Status: DC
  Filled 2019-11-05: qty 3

## 2019-11-05 MED ORDER — METHYLPREDNISOLONE SODIUM SUCC 125 MG IJ SOLR
125.0000 mg | Freq: Once | INTRAMUSCULAR | Status: AC | PRN
  Administered 2019-11-05: 125 mg via INTRAVENOUS
  Filled 2019-11-05: qty 2

## 2019-11-05 MED ORDER — BUDESONIDE 0.5 MG/2ML IN SUSP
0.5000 mg | Freq: Two times a day (BID) | RESPIRATORY_TRACT | Status: DC
  Filled 2019-11-05: qty 2

## 2019-11-05 MED ORDER — HEPARIN SODIUM (PORCINE) 1000 UNIT/ML IJ SOLN
INTRAMUSCULAR | Status: AC
  Filled 2019-11-05: qty 1

## 2019-11-05 MED ORDER — HYDROMORPHONE HCL 1 MG/ML IJ SOLN: 1.0000 mg | Freq: Once | INTRAMUSCULAR | Status: DC | PRN

## 2019-11-05 MED ORDER — FENTANYL CITRATE (PF) 100 MCG/2ML IJ SOLN
INTRAMUSCULAR | Status: AC
  Filled 2019-11-05: qty 2

## 2019-11-05 MED ORDER — HEPARIN SODIUM (PORCINE) 1000 UNIT/ML IJ SOLN
INTRAMUSCULAR | Status: DC | PRN
  Administered 2019-11-05: 3000 [IU] via INTRAVENOUS

## 2019-11-05 MED ORDER — METHYLPREDNISOLONE SODIUM SUCC 125 MG IJ SOLR
40.0000 mg | Freq: Every day | INTRAMUSCULAR | Status: DC
  Administered 2019-11-06: 05:00:00 40 mg via INTRAVENOUS
  Filled 2019-11-05: qty 2

## 2019-11-05 MED ORDER — MIDAZOLAM HCL 2 MG/2ML IJ SOLN
INTRAMUSCULAR | Status: DC | PRN
  Administered 2019-11-05: 2 mg via INTRAVENOUS

## 2019-11-05 MED ORDER — ONDANSETRON HCL 4 MG/2ML IJ SOLN: 4.0000 mg | Freq: Four times a day (QID) | INTRAMUSCULAR | Status: DC | PRN

## 2019-11-05 MED ORDER — CEFAZOLIN SODIUM-DEXTROSE 2-4 GM/100ML-% IV SOLN
2.0000 g | Freq: Once | INTRAVENOUS | Status: DC
  Administered 2019-11-05: 2 g via INTRAVENOUS
  Filled 2019-11-05: qty 100

## 2019-11-05 MED ORDER — IODIXANOL 320 MG/ML IV SOLN
INTRAVENOUS | Status: DC | PRN
  Administered 2019-11-05: 45 mL

## 2019-11-05 MED ORDER — FENTANYL CITRATE (PF) 100 MCG/2ML IJ SOLN
INTRAMUSCULAR | Status: DC | PRN
  Administered 2019-11-05: 50 ug via INTRAVENOUS

## 2019-11-05 SURGICAL SUPPLY — 16 items
BALLN ULTRVRSE 12X80X75 (BALLOONS) ×3
BALLOON ULTRVRSE 12X80X75 (BALLOONS) IMPLANT
CANISTER PENUMBRA ENGINE (MISCELLANEOUS) ×2 IMPLANT
CANNULA 5F STIFF (CANNULA) ×2 IMPLANT
CATH BEACON 5 .035 65 KMP TIP (CATHETERS) ×2 IMPLANT
CATH INDIGO 12XTORQ 100 (CATHETERS) ×2 IMPLANT
COVER PROBE U/S 5X48 (MISCELLANEOUS) ×2 IMPLANT
DEVICE PRESTO INFLATION (MISCELLANEOUS) ×2 IMPLANT
PACK ANGIOGRAPHY (CUSTOM PROCEDURE TRAY) ×3 IMPLANT
SHEATH PINNACLE 11FRX10 (SHEATH) ×2 IMPLANT
SUT PROLENE 0 CT 1 30 (SUTURE) ×2 IMPLANT
SYR MEDRAD MARK 7 150ML (SYRINGE) ×2 IMPLANT
TUBING CONTRAST HIGH PRESS 72 (TUBING) ×3 IMPLANT
WIRE GUIDERIGHT .035X150 (WIRE) ×3 IMPLANT
WIRE MAGIC TORQUE 260C (WIRE) ×2 IMPLANT
WIRE SPARTACORE .014X190CM (WIRE) IMPLANT

## 2019-11-05 NOTE — Op Note (Signed)
[name]   OPERATIVE NOTE   PRE-OPERATIVE DIAGNOSIS: extensive, recurrent right lower extremity DVT  POST-OPERATIVE DIAGNOSIS: same   PROCEDURE: 1.   US guidance for vascular access to right popliteal vein 2.   Catheter placement into right common iliac vein from right popliteal approach 3.   IVC gram and right lower extremity venogram 4.   Catheter directed thrombolysis with 4 mg of TPA to the right common femoral vein and iliac veins 5.   Mechanical thrombectomy to right superficial femoral vein, common femoral vein, external and common iliac veins with penumbra cat 12 device 6.   PTA of right superficial femoral vein and common femoral vein with 12 mm balloon 7.   PTA of right external and common iliac veins with follow-up mm balloon   SURGEON: Leotis Pain, MD  ASSISTANT(S): none  ANESTHESIA: local with moderate conscious sedation for 30 minutes using 2 mg of Versed and 50 mcg of Fentanyl  ESTIMATED BLOOD LOSS: 400 cc  FINDING(S): 1.  Extensive recurrent thrombosis of the right superficial femoral vein, common femoral vein, external iliac vein, and common iliac vein  SPECIMEN(S):  none  INDICATIONS:    Patient is a 49 y.o. male who presents with recurrent thrombosis in the venous system of the right lower extremity after previous intervention about a week ago.  Patient has marked leg swelling and pain.  Compliance was questionable.  Venous intervention is performed to reduce the symtpoms and avoid long term postphlebitic symptoms.    DESCRIPTION: After obtaining full informed written consent, the patient was brought back to the vascular suite and placed supine upon the table. Moderate conscious sedation was administered during a face to face encounter with the patient throughout the procedure with my supervision of the RN administering medicines and monitoring the patient's vital signs, pulse oximetry, telemetry and mental status throughout from the  start of the procedure until the patient was taken to the recovery room.  After obtaining adequate anesthesia, the patient was prepped and draped in the standard fashion. The patient was then placed into the prone position.  The right popliteal vein was then accessed under direct ultrasound guidance without difficulty with a micropuncture needle and a permanent image was recorded.  I then upsized to an 11Fr sheath over a J wire.  3000 units of heparin were then given.  Imaging showed extensive DVT with minimal flow starting in the mid superficial femoral vein in the mid thigh with thrombus advancing up into the iliac veins.  A Kumpe catheter and Magic tourque wire were then advanced into the CFV and images were performed.  There was significant thrombus burden in the common femoral vein and the iliac veins.  I was able to cross the thrombus and stenosis and advance into the right common iliac vein where the thrombus was still present and image the IVC which was patent.  The previous IVC filter was in place and did not have significant thrombus burden within it.  I then used the Kumpe catheter and instilled 4 mg of tpa throughout the right iliac veins and common femoral veins.  After this dwelled, I used the Penumbra Cat 12 catheter and evacuated about 200- 250 cc of effluent with mechanical thrombectomy throughout the iliac veins, CFV, and SFV.  This had mild to moderate improvement.  I then treated the popliteal SFV and CFV with an 8 cm length 12 mm diameter angioplasty balloon to open a channel.  2 inflations were done to  6 atm in the mid to proximal SFV and common femoral vein.  This resulted in some resolution of the thrombus and improved flow.  I then turned my attention to the iliac veins.  The stenosis/occlusion and thrombus was treated with a 8 cm length 12 mm diameter angioplasty balloon.  Again 2 inflations up to 6 to 8 atm were done to go both in the right external iliac vein and the common iliac vein.   Following this there was significant improvement particularly the superficial femoral vein where the residual thrombus was very minimal.  There is still some residual thrombus in the common femoral vein and iliac veins.  2 more passes with the penumbra cat 12 device were performed and another 150 cc of effluent was returned.  Completion imaging following this showed no significant residual thrombus within the iliac veins, mild to moderate residual thrombus in the common femoral vein.  I felt we had markedly debulk the clot and at this point he will need to continue anticoagulation if he hopes to keep this patent.  I then elected to terminate the procedure.  The sheath was removed and a dressing was placed.  She was taken to the recovery room in stable condition having tolerated the procedure well.    COMPLICATIONS: None  CONDITION: Stable  Leotis Pain 11/05/2019 1:35 PM

## 2019-11-05 NOTE — Progress Notes (Signed)
ANTICOAGULATION CONSULT NOTE  Pharmacy Consult for heparin Indication: DVT  No Known Allergies  Patient Measurements: Height: 5\' 7"  (170.2 cm) Weight: 257 lb (116.6 kg) IBW/kg (Calculated) : 66.1 Heparin Dosing Weight: 92 kg  Vital Signs: Temp: 98.9 F (37.2 C) (03/04 1451) Temp Source: Oral (03/04 1451) BP: 99/77 (03/04 1451) Pulse Rate: 80 (03/04 1451)  Labs: Recent Labs    11/03/19 0024 11/03/19 0715 11/04/19 3557 11/04/19 3220 11/04/19 1156 11/04/19 2021 11/05/19 0625 11/05/19 1508  HGB 17.0  --  16.0  --   --   --  15.2  --   HCT 50.5  --  48.2  --   --   --  46.8  --   PLT 145*  --  153  --   --   --  161  --   APTT 54*   < > 67*   < > 57* 62* 71*  --   LABPROT  --   --   --   --  15.0  --   --   --   INR  --   --   --   --  1.2  --   --   --   HEPARINUNFRC  --   --   --   --   --   --  0.50 0.23*  CREATININE 0.66  --  0.67  --   --   --  0.78  --    < > = values in this interval not displayed.    Estimated Creatinine Clearance: 136.3 mL/min (by C-G formula based on SCr of 0.78 mg/dL).   Medical History: Past Medical History:  Diagnosis Date  . Arthritis   . Hx of blood clots   . 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 Patient received thrombolysis with tPA and mechanical thrombectomy and started on Xarelto after having been on heparin. Patient presents back with pain to right knee and groin. Ultrasound of the right lower extremity showed extensive DVT throughout vasculature. Per EDP consult with Vascular, start heparin drip. Pharmacy consulted. Patient reports compliance with Xarelto since recent discharge with last dose 3/1 at ~ 0700.  3/2 0024 aPTT 54, subtherapeutic.  H/H ok, PLTs dropped slightly.  Will rebolus with 1000 units x 1 and increase infusion to 1650 units/hr  3/2 0715 aPTT 55, subtherapeutic. Will rebolus with 1200 units x  1 and increase infusion to 1750 units/hr  3/2 @ 1547 aPTT 53, subtherapeutic. Will rebolus with 1200 units x 1 and increase infusion to 1950 units/hr. Platelet level has slightly dropped, however it looks at though patient as a history of low platelet counts.   3/2 2257 aPTT 58, subtherapeutic.  Will rebolus with 1200 units x 1 and increase infusion to 2150 units/hr.  Recheck aPTT 6 hours after rate increase.  3/3 0614 aPTT 67, therapeutic x 1.    3/3 1156 APTT 57 sec. Subtherapeutic. Will rebolus with 1200 units x 1, and will increase rate to 2350 units/hr. Will recheck APTT in 6 hours.   3/3: aPTT @ 2021 = 62 subtherapeutic. Will increase drip rate to 2450 units/hr  3/4@0625 : HL 0.50, APTT 71 sec. Therapeutic x 1. Both levels are currently correlating. Will transition to dosing based off of HL. Platelets and Hgb remain stable. Will order confirmatory level in 6 hours.  Goal of Therapy:  APTT 66-102 seconds Heparin level 0.3-0.7 units/ml Monitor platelets by anticoagulation protocol: Yes   Plan:  3/4 1508 HL 0.23 subtherapeutic. Spoke with nurse. Heparin still paused after procedure. Will restart heparin at 2450 units/hr. Patient previously therapeutic at this rate. Subtherapeutic level likely reflective of pause in heparin for approximately 4 hours. Will order repeat HL for 2200.  Continue to monitor H&H and platelets with daily labs.  Pricilla Riffle, PharmD Clinical Pharmacist 11/05/2019 3:53 PM

## 2019-11-05 NOTE — Progress Notes (Signed)
Patient ID: Matthew [name], male   DOB: 07-28-1971, 49 y.o.   MRN: [id number] [facility] PROGRESS NOTE  Matthew [name] TML:[id number] DOB: Feb 26, 1971 DOA: 11/02/2019 PCP: Rolm Gala, NP  HPI/Subjective: Patient complains of severe pain and he can hardly move.  He states he can hardly get up off the bed.  He states that he is short of breath.  He has some cough.  He has a history of smoking.  His leg is so tender that he hardly wants to move it.  Objective: Vitals:   11/05/19 1306 11/05/19 1316  BP:    Pulse:    Resp:    Temp:    SpO2: 98% 98%    Intake/Output Summary (Last 24 hours) at 11/05/2019 1319 Last data filed at 11/05/2019 1200 Gross per 24 hour  Intake 864.63 ml  Output 1075 ml  Net -210.37 ml   Filed Weights   11/02/19 1352  Weight: 116.6 kg    ROS: Review of Systems  Constitutional: Negative for chills and fever.  Eyes: Negative for blurred vision.  Respiratory: Positive for cough and shortness of breath.   Cardiovascular: Negative for chest pain.  Gastrointestinal: Negative for abdominal pain, constipation, diarrhea, nausea and vomiting.  Genitourinary: Negative for dysuria.  Musculoskeletal: Positive for back pain and joint pain.  Neurological: Negative for dizziness and headaches.   Exam: Physical Exam  Constitutional: He is oriented to person, place, and time.  HENT:  Nose: No mucosal edema.  Mouth/Throat: No oropharyngeal exudate or posterior oropharyngeal edema.  Eyes: Conjunctivae and lids are normal.  Neck: Carotid bruit is not present.  Cardiovascular: S1 normal and S2 normal. Exam reveals no gallop.  No murmur heard. Respiratory: No respiratory distress. He has decreased breath sounds in the right lower field and the left lower field. He has wheezes in the right lower field and the left lower field. He has no rhonchi. He has no rales.  GI: Soft. Bowel sounds are normal. There is no abdominal tenderness.  Musculoskeletal:     Right  knee: Swelling present.     Right ankle: Swelling present.     Left ankle: No swelling.  Lymphadenopathy:    He has no cervical adenopathy.  Neurological: He is alert and oriented to person, place, and time.  Skin: Skin is warm. No rash noted. Nails show no clubbing.  Psychiatric: He has a normal mood and affect.      Data Reviewed: Basic Metabolic Panel: Recent Labs  Lab 10/30/19 0716 11/02/19 1534 11/03/19 0024 11/04/19 0614 11/05/19 0625  NA 134* 137 137 133* 132*  K 4.8 4.0 4.2 4.8 4.6  CL 96* 97* 99 93* 92*  CO2 30 30 31  32 36*  GLUCOSE 109* 109* 112* 121* 120*  BUN 16 9 10 13 12   CREATININE 0.89 0.80 0.66 0.67 0.78  CALCIUM 8.6* 8.8* 8.5* 8.6* 8.7*  MG  --   --  1.5*  --  1.8   Liver Function Tests: Recent Labs  Lab 11/02/19 1534  AST 21  ALT 13  ALKPHOS 129*  BILITOT 2.1*  PROT 7.7  ALBUMIN 3.0*   CBC: Recent Labs  Lab 10/30/19 0716 11/02/19 1534 11/03/19 0024 11/04/19 0614 11/05/19 0625  WBC 9.8 13.7* 13.7* 11.9* 10.7*  NEUTROABS  --  9.4*  --   --   --   HGB 17.7* 18.2* 17.0 16.0 15.2  HCT 53.7* 53.5* 50.5 48.2 46.8  MCV 102.3* 98.0 98.8 99.8 101.1*  PLT 125* 155 145*  153 161   BNP (last 3 results) Recent Labs    03/10/19 1940  BNP 41.0     Recent Results (from the past 240 hour(s))  Respiratory Panel by RT PCR (Flu A&B, Covid) - Nasopharyngeal Swab     Status: None   Collection Time: 10/29/19 10:09 AM   Specimen: Nasopharyngeal Swab  Result Value Ref Range Status   SARS Coronavirus 2 by RT PCR NEGATIVE NEGATIVE Final    Comment: (NOTE) SARS-CoV-2 target nucleic acids are NOT DETECTED. The SARS-CoV-2 RNA is generally detectable in upper respiratoy specimens during the acute phase of infection. The lowest concentration of SARS-CoV-2 viral copies this assay can detect is 131 copies/mL. A negative result does not preclude SARS-Cov-2 infection and should not be used as the sole basis for treatment or other patient management  decisions. A negative result may occur with  improper specimen collection/handling, submission of specimen other than nasopharyngeal swab, presence of viral mutation(s) within the areas targeted by this assay, and inadequate number of viral copies (<131 copies/mL). A negative result must be combined with clinical observations, patient history, and epidemiological information. The expected result is Negative. Fact Sheet for Patients:  https://www.moore.com/ Fact Sheet for Healthcare Providers:  https://www.young.biz/ This test is not yet ap proved or cleared by the [place] FDA and  has been authorized for detection and/or diagnosis of SARS-CoV-2 by FDA under an Emergency Use Authorization (EUA). This EUA will remain  in effect (meaning this test can be used) for the duration of the COVID-19 declaration under Section 564(b)(1) of the Act, 21 U.S.C. section 360bbb-3(b)(1), unless the authorization is terminated or revoked sooner.    Influenza A by PCR NEGATIVE NEGATIVE Final   Influenza B by PCR NEGATIVE NEGATIVE Final    Comment: (NOTE) The Xpert Xpress SARS-CoV-2/FLU/RSV assay is intended as an aid in  the diagnosis of influenza from Nasopharyngeal swab specimens and  should not be used as a sole basis for treatment. Nasal washings and  aspirates are unacceptable for Xpert Xpress SARS-CoV-2/FLU/RSV  testing. Fact Sheet for Patients: https://www.moore.com/ Fact Sheet for Healthcare Providers: https://www.young.biz/ This test is not yet approved or cleared by the [place] FDA and  has been authorized for detection and/or diagnosis of SARS-CoV-2 by  FDA under an Emergency Use Authorization (EUA). This EUA will remain  in effect (meaning this test can be used) for the duration of the  Covid-19 declaration under Section 564(b)(1) of the Act, 21  U.S.C. section 360bbb-3(b)(1), unless the authorization  is  terminated or revoked. Performed at [facility], [address], [place], [place] [address]      Studies: CT ANGIO AO+BIFEM W & OR WO CONTRAST  Result Date: 11/04/2019 CLINICAL DATA:  Arterial embolism. Pain in the right lower extremity. EXAM: CT ANGIOGRAPHY OF ABDOMINAL AORTA WITH ILIOFEMORAL RUNOFF TECHNIQUE: Multidetector CT imaging of the abdomen, pelvis and lower extremities was performed using the standard protocol during bolus administration of intravenous contrast. Multiplanar CT image reconstructions and MIPs were obtained to evaluate the vascular anatomy. CONTRAST:  OMNIPAQUE IOHEXOL 350 MG/ML SOLN COMPARISON:  CT PE study dated March 10, 2019. FINDINGS: VASCULAR Aorta: There are mild atherosclerotic changes throughout the abdominal aorta without evidence for an aneurysm or stenosis. Celiac: Patent without evidence of aneurysm, dissection, vasculitis or significant stenosis. SMA: Patent without evidence of aneurysm, dissection, vasculitis or significant stenosis. Renals: Both renal arteries are patent without evidence of aneurysm, dissection, vasculitis, fibromuscular dysplasia or significant stenosis. IMA: Patent without  evidence of aneurysm, dissection, vasculitis or significant stenosis. RIGHT Lower Extremity Inflow: Common, internal and external iliac arteries are patent without evidence of aneurysm, dissection, vasculitis or significant stenosis. Outflow: Common, superficial and profunda femoral arteries and the popliteal artery are patent without evidence of aneurysm, dissection, vasculitis or significant stenosis. Runoff: Patent three vessel runoff to the ankle. LEFT Lower Extremity Inflow: Common, internal and external iliac arteries are patent without evidence of aneurysm, dissection, vasculitis or significant stenosis. Outflow: Common, superficial and profunda femoral arteries and the popliteal artery are patent without evidence of aneurysm, dissection, vasculitis or  significant stenosis. Runoff: Patent three vessel runoff to the ankle. Veins: There are extensive filling defects within the right common femoral vein and right great saphenous vein. There is extensive stranding about the right superficial femoral vein throughout the majority of its course likely related to acute thrombosis. There is a pocket of gas within the right common femoral vein which may related to recent surgical intervention. The right external iliac vein and common iliac vein both appear to be patent. There is suboptimal opacification of the venous structures of the left lower extremity however there may be some thrombus within the left great saphenous vein. Extensive collateral veins versus varicose veins are noted involving the left lower extremity. Review of the MIP images confirms the above findings. NON-VASCULAR Lower chest: The lung bases are suboptimally evaluated secondary to extensive motion artifact.Heart is significantly enlarged. There is reflux of contrast in the IVC consistent with underlying cardiac dysfunction. Hepatobiliary: There is probable hepatic steatosis. The liver surface appears mildly nodular. Cholelithiasis without acute inflammation.There is no biliary ductal dilation. Pancreas: Normal contours without ductal dilatation. No peripancreatic fluid collection. Spleen: No splenic laceration or hematoma. Adrenals/Urinary Tract: --Adrenal glands: No adrenal hemorrhage. --Right kidney/ureter: No hydronephrosis or perinephric hematoma. --Left kidney/ureter: No hydronephrosis or perinephric hematoma. --Urinary bladder: There is mild bladder wall thickening which is felt to be secondary to underdistention. Stomach/Bowel: --Stomach/Duodenum: There multiple hyperdense structure near the GE junction which are stable from prior study. --Small bowel: No dilatation or inflammation. --Colon: No focal abnormality. --Appendix: Normal. Lymphatic: --No retroperitoneal lymphadenopathy. --No  mesenteric lymphadenopathy. --No pelvic or inguinal lymphadenopathy. Reproductive: Unremarkable Other: No ascites or free air. The abdominal wall is normal. Musculoskeletal. There is extensive asymmetric right lower extremity edema. There is a moderate-sized right-sided joint effusion with synovial enhancement. There are old healed fractures of the left distal tibia and fibula. There are degenerative changes of both mortise joints. Degenerative changes are noted of both knees. IMPRESSION: VASCULAR 1. No acute arterial abnormality. No evidence for an arterial embolism. Minimal atherosclerotic changes are noted. There is a normal 3 vessel runoff bilaterally. 2. Extensive right lower extremity venous thrombosis is again noted. There is a pocket of gas within the right common femoral vein which may related to recent surgical intervention and should be correlated clinically. 3. There is extensive collateral veins versus varicose veins involving the left lower extremity. 4. Well-positioned IVC filter in place. NON-VASCULAR 1. There is probable hepatic steatosis with mildly nodular liver surface, raising concern for underlying cirrhosis. 2. There is cholelithiasis without acute inflammation. 3. There is a moderate-sized right-sided joint effusion with synovial enhancement. Correlation with patient's symptoms and physical exam is recommended. If there is clinical concern for septic arthritis, follow-up with joint aspiration is recommended. 4. Extensive asymmetric right lower extremity edema, presumably related to the patient's known extensive venous thrombosis. 5. Old healed fractures of the left distal tibia and fibula. 6. Cardiomegaly  with reflux of contrast into the IVC consistent with underlying cardiac dysfunction. 7. Aortic Atherosclerosis (ICD10-I70.0). Electronically Signed   By: Constance Holster M.D.   On: 11/04/2019 20:24    Scheduled Meds: . [MAR Hold] budesonide (PULMICORT) nebulizer solution  0.5 mg  Nebulization BID  . fentaNYL      . heparin      . [MAR Hold] ipratropium-albuterol  3 mL Nebulization Q6H  . [MAR Hold] magnesium oxide  800 mg Oral BID  . midazolam      . [MAR Hold] nicotine  14 mg Transdermal Q24H  . [MAR Hold] sodium chloride flush  3 mL Intravenous Q12H   Continuous Infusions: . sodium chloride 75 mL/hr at 11/05/19 0400  . sodium chloride 75 mL/hr at 11/05/19 1124  . ceFAZolin    .  ceFAZolin (ANCEF) IV    . heparin Stopped (11/05/19 1226)    Assessment/Plan:  1. Recurrent right lower extremity DVT.  Severe right leg pain and swelling, With inability to walk on it or put any pressure on it.  Vascular surgery to do procedure today. Oxycodone orally and Dilaudid IV as needed for moderate and severe pain.  Patient states he will only take Xarelto for blood thinning.  Continue heparin drip for right now. 2. Wheeze and history of smoking.  Low grade fever. Likely mild COPD exacerbation.  We will continue Solu-Medrol daily.  Patient ordered for Solu-Medrol this morning prior to procedure.  We will give nebulizer treatments DuoNeb and budesonide.  Patient on preop procedure antibiotics.  Will get chest x-ray for tomorrow morning. 3. Paroxysmal atrial fibrillation.  Rate controlled.  Not on any rate controlling medications.  On heparin drip right now.  Likely Xarelto upon discharge 4. Nodular contour of the liver which can be consistent with cirrhosis.  Advised the patient not to drink any alcohol.  Send off hepatitis profiles, ANA and AMA. 5. Hypomagnesemia on oral magnesium 6. Impaired fasting glucose.  Last hemoglobin A1c 5.9.  Patient not a diabetic.  Code Status:     Code Status Orders  (From admission, onward)         Start     Ordered   11/02/19 1744  Full code  Continuous     11/02/19 1745        Code Status History    Date Active Date Inactive Code Status Order ID Comments User Context   10/29/2019 1239 10/31/2019 0131 Full Code [id number]  Ivor Costa,  MD Inpatient   03/11/2019 0056 03/14/2019 2005 Full Code [id number]  Mayer Camel, NP ED   02/17/2019 2013 02/19/2019 1555 Full Code [id number]  Fritzi Mandes, MD ED   11/19/2018 2234 11/20/2018 1643 Full Code [id number]  Lance Coon, MD Inpatient   Advance Care Planning Activity     Family Communication: Spoke with spouse on the phone Disposition Plan: Patient will have vascular procedure for the right leg this morning.  We will need to see if the patient is able to bear weight on his leg prior to disposition.  Patient will need continued treatment for shortness of breath and wheeze.  Likely will need another 2 days in the hospital.  Consultants:  Vascular surgery  Time spent: 28 minutes  [place]

## 2019-11-05 NOTE — Progress Notes (Signed)
ANTICOAGULATION CONSULT NOTE  Pharmacy Consult for heparin Indication: DVT  No Known Allergies  Patient Measurements: Height: 5\' 7"  (170.2 cm) Weight: 257 lb (116.6 kg) IBW/kg (Calculated) : 66.1 Heparin Dosing Weight: 92 kg  Vital Signs: Temp: 99.9 F (37.7 C) (03/04 0705) Temp Source: Oral (03/04 0705) BP: 107/70 (03/04 0705) Pulse Rate: 86 (03/04 0705)  Labs: Recent Labs    11/02/19 1534 11/02/19 1540 11/03/19 0024 11/03/19 0715 11/04/19 01/04/20 11/04/19 01/04/20 11/04/19 1156 11/04/19 2021 11/05/19 0625  HGB   < >  --  17.0  --  16.0  --   --   --  15.2  HCT   < >  --  50.5  --  48.2  --   --   --  46.8  PLT   < >  --  145*  --  153  --   --   --  161  APTT   < > 33 54*   < > 67*   < > 57* 62* 71*  LABPROT  --  20.2*  --   --   --   --  15.0  --   --   INR  --  1.7*  --   --   --   --  1.2  --   --   HEPARINUNFRC  --  2.56*  --   --   --   --   --   --  0.50  CREATININE   < >  --  0.66  --  0.67  --   --   --  0.78   < > = values in this interval not displayed.    Estimated Creatinine Clearance: 136.3 mL/min (by C-G formula based on SCr of 0.78 mg/dL).   Medical History: Past Medical History:  Diagnosis Date  . Arthritis   . Hx of blood clots   . 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 Patient received thrombolysis with tPA and mechanical thrombectomy and started on Xarelto after having been on heparin. Patient presents back with pain to right knee and groin. Ultrasound of the right lower extremity showed extensive DVT throughout vasculature. Per EDP consult with Vascular, start heparin drip. Pharmacy consulted. Patient reports compliance with Xarelto since recent discharge with last dose 3/1 at ~ 0700.  3/2 0024 aPTT 54, subtherapeutic.  H/H ok, PLTs dropped slightly.  Will rebolus with 1000 units x 1 and increase infusion to 1650 units/hr  3/2  0715 aPTT 55, subtherapeutic. Will rebolus with 1200 units x 1 and increase infusion to 1750 units/hr  3/2 @ 1547 aPTT 53, subtherapeutic. Will rebolus with 1200 units x 1 and increase infusion to 1950 units/hr. Platelet level has slightly dropped, however it looks at though patient as a history of low platelet counts.   3/2 2257 aPTT 58, subtherapeutic.  Will rebolus with 1200 units x 1 and increase infusion to 2150 units/hr.  Recheck aPTT 6 hours after rate increase.  3/3 0614 aPTT 67, therapeutic x 1.    3/3 1156 APTT 57 sec. Subtherapeutic. Will rebolus with 1200 units x 1, and will increase rate to 2350 units/hr. Will recheck APTT in 6 hours.   3/3: aPTT @ 2021 = 62 subtherapeutic. Will increase drip rate to 2450 units/hr  Goal of Therapy:  APTT 66-102 seconds Heparin level 0.3-0.7 units/ml Monitor platelets by  anticoagulation protocol: Yes   Plan:  3/4@0625 : HL 0.50, APTT 71 sec. Therapeutic x 1. Both levels are currently correlating. Will transition to dosing based off of HL. Platelets and Hgb remain stable. Will order confirmatory level in 6 hours.  Continue to monitor H&H and platelets with daily labs.  Pearla Dubonnet, PharmD Clinical Pharmacist 11/05/2019 7:31 AM

## 2019-11-05 NOTE — H&P (Signed)
[facility] History & Physical Update  The patient was interviewed and re-examined.  The patient's previous History and Physical has been reviewed and is unchanged.  There is no change in the plan of care. We plan to proceed with the scheduled procedure.  Festus Barren, MD  11/05/2019, 12:21 PM

## 2019-11-05 NOTE — Progress Notes (Signed)
ANTICOAGULATION CONSULT NOTE  Pharmacy Consult for heparin Indication: DVT  No Known Allergies  Patient Measurements: Height: 5\' 7"  (170.2 cm) Weight: 257 lb (116.6 kg) IBW/kg (Calculated) : 66.1 Heparin Dosing Weight: 92 kg  Vital Signs: Temp: 98.6 F (37 C) (03/04 2032) Temp Source: Oral (03/04 2032) BP: 108/77 (03/04 2032) Pulse Rate: 78 (03/04 2032)  Labs: Recent Labs    11/03/19 0024 11/03/19 0715 11/04/19 0962 11/04/19 8366 11/04/19 1156 11/04/19 2021 11/05/19 0625 11/05/19 1508 11/05/19 2209  HGB 17.0  --  16.0  --   --   --  15.2  --   --   HCT 50.5  --  48.2  --   --   --  46.8  --   --   PLT 145*  --  153  --   --   --  161  --   --   APTT 54*   < > 67*   < > 57* 62* 71*  --   --   LABPROT  --   --   --   --  15.0  --   --   --   --   INR  --   --   --   --  1.2  --   --   --   --   HEPARINUNFRC  --   --   --   --   --   --  0.50 0.23* 0.57  CREATININE 0.66  --  0.67  --   --   --  0.78  --   --    < > = values in this interval not displayed.    Estimated Creatinine Clearance: 136.3 mL/min (by C-G formula based on SCr of 0.78 mg/dL).   Medical History: Past Medical History:  Diagnosis Date  . Arthritis   . Hx of blood clots   . 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 Patient received thrombolysis with tPA and mechanical thrombectomy and started on Xarelto after having been on heparin. Patient presents back with pain to right knee and groin. Ultrasound of the right lower extremity showed extensive DVT throughout vasculature. Per EDP consult with Vascular, start heparin drip. Pharmacy consulted. Patient reports compliance with Xarelto since recent discharge with last dose 3/1 at ~ 0700.  3/2 0024 aPTT 54, subtherapeutic.  H/H ok, PLTs dropped slightly.  Will rebolus with 1000 units x 1 and increase infusion to 1650 units/hr  3/2 0715  aPTT 55, subtherapeutic. Will rebolus with 1200 units x 1 and increase infusion to 1750 units/hr  3/2 @ 1547 aPTT 53, subtherapeutic. Will rebolus with 1200 units x 1 and increase infusion to 1950 units/hr. Platelet level has slightly dropped, however it looks at though patient as a history of low platelet counts.   3/2 2257 aPTT 58, subtherapeutic.  Will rebolus with 1200 units x 1 and increase infusion to 2150 units/hr.  Recheck aPTT 6 hours after rate increase.  3/3 0614 aPTT 67, therapeutic x 1.    3/3 1156 APTT 57 sec. Subtherapeutic. Will rebolus with 1200 units x 1, and will increase rate to 2350 units/hr. Will recheck APTT in 6 hours.   3/3: aPTT @ 2021 = 62 subtherapeutic. Will increase drip rate to 2450 units/hr  3/4@0625 : HL 0.50, APTT 71 sec. Therapeutic x 1. Both levels are currently correlating. Will  transition to dosing based off of HL. Platelets and Hgb remain stable. Will order confirmatory level in 6 hours.  03/04 2209 Hl 0.57 therapeutic  Goal of Therapy:  APTT 66-102 seconds Heparin level 0.3-0.7 units/ml Monitor platelets by anticoagulation protocol: Yes   Plan:  3/4 2209 HL 0.57 therapeutic x 1. Continue heparin at 2450 units/hr. F/u HL in am to confirm  Continue to monitor H&H and platelets with daily labs.  Wayland Denis, PharmD Clinical Pharmacist 11/05/2019 10:41 PM

## 2019-11-06 ENCOUNTER — Encounter: Payer: Self-pay | Admitting: Cardiology

## 2019-11-06 ENCOUNTER — Inpatient Hospital Stay: Payer: Self-pay

## 2019-11-06 DIAGNOSIS — Z6841 Body Mass Index (BMI) 40.0 and over, adult: Secondary | ICD-10-CM

## 2019-11-06 DIAGNOSIS — I82411 Acute embolism and thrombosis of right femoral vein: Principal | ICD-10-CM

## 2019-11-06 DIAGNOSIS — R0602 Shortness of breath: Secondary | ICD-10-CM

## 2019-11-06 DIAGNOSIS — J441 Chronic obstructive pulmonary disease with (acute) exacerbation: Secondary | ICD-10-CM

## 2019-11-06 LAB — HEPATITIS B CORE ANTIBODY, TOTAL: Hep B Core Total Ab: NONREACTIVE

## 2019-11-06 LAB — CBC
HCT: 47.1 % (ref 39.0–52.0)
Hemoglobin: 16.2 g/dL (ref 13.0–17.0)
MCH: 33.8 pg (ref 26.0–34.0)
MCHC: 34.4 g/dL (ref 30.0–36.0)
MCV: 98.3 fL (ref 80.0–100.0)
Platelets: 182 10*3/uL (ref 150–400)
RBC: 4.79 MIL/uL (ref 4.22–5.81)
RDW: 13.1 % (ref 11.5–15.5)
WBC: 15.6 10*3/uL — ABNORMAL HIGH (ref 4.0–10.5)
nRBC: 0 % (ref 0.0–0.2)

## 2019-11-06 LAB — COMPREHENSIVE METABOLIC PANEL
ALT: 16 U/L (ref 0–44)
AST: 26 U/L (ref 15–41)
Albumin: 2.7 g/dL — ABNORMAL LOW (ref 3.5–5.0)
Alkaline Phosphatase: 156 U/L — ABNORMAL HIGH (ref 38–126)
Anion gap: 8 (ref 5–15)
BUN: 12 mg/dL (ref 6–20)
CO2: 32 mmol/L (ref 22–32)
Calcium: 8.8 mg/dL — ABNORMAL LOW (ref 8.9–10.3)
Chloride: 96 mmol/L — ABNORMAL LOW (ref 98–111)
Creatinine, Ser: 0.68 mg/dL (ref 0.61–1.24)
GFR calc Af Amer: >60 mL/min (ref >60)
GFR calc non Af Amer: >60 mL/min (ref >60)
Glucose, Bld: 211 mg/dL — ABNORMAL HIGH (ref 70–99)
Potassium: 4.5 mmol/L (ref 3.5–5.1)
Sodium: 136 mmol/L (ref 135–145)
Total Bilirubin: 1 mg/dL (ref 0.3–1.2)
Total Protein: 7.5 g/dL (ref 6.5–8.1)

## 2019-11-06 LAB — HEPATITIS C ANTIBODY: HCV Ab: NONREACTIVE

## 2019-11-06 LAB — HEPARIN LEVEL (UNFRACTIONATED): Heparin Unfractionated: 0.57 IU/mL (ref 0.30–0.70)

## 2019-11-06 MED ORDER — RIVAROXABAN (XARELTO) VTE STARTER PACK (15 & 20 MG): ORAL_TABLET | ORAL | 0 refills | Status: DC

## 2019-11-06 MED ORDER — RIVAROXABAN 20 MG PO TABS: 20.0000 mg | ORAL_TABLET | Freq: Every day | ORAL | Status: DC

## 2019-11-06 MED ORDER — HEPARIN BOLUS VIA INFUSION
4000.0000 [IU] | Freq: Once | INTRAVENOUS | Status: AC
  Administered 2019-11-06: 4000 [IU] via INTRAVENOUS
  Filled 2019-11-06: qty 4000

## 2019-11-06 MED ORDER — ALBUTEROL SULFATE HFA 108 (90 BASE) MCG/ACT IN AERS: 2.0000 | INHALATION_SPRAY | Freq: Four times a day (QID) | RESPIRATORY_TRACT | 0 refills | Status: AC | PRN

## 2019-11-06 MED ORDER — RIVAROXABAN 15 MG PO TABS
15.0000 mg | ORAL_TABLET | ORAL | Status: AC
  Administered 2019-11-06: 18:00:00 15 mg via ORAL
  Filled 2019-11-06: qty 1

## 2019-11-06 MED ORDER — RIVAROXABAN 15 MG PO TABS
15.0000 mg | ORAL_TABLET | Freq: Two times a day (BID) | ORAL | Status: DC
  Filled 2019-11-06 (×2): qty 1

## 2019-11-06 MED ORDER — HEPARIN (PORCINE) 25000 UT/250ML-% IV SOLN
2450.0000 [IU]/h | INTRAVENOUS | Status: DC
  Administered 2019-11-06: 13:00:00 2450 [IU]/h via INTRAVENOUS
  Filled 2019-11-06: qty 250

## 2019-11-06 MED ORDER — BUDESONIDE 0.5 MG/2ML IN SUSP: 0.5000 mg | Freq: Two times a day (BID) | RESPIRATORY_TRACT | Status: DC | PRN

## 2019-11-06 MED ORDER — RIVAROXABAN 15 MG PO TABS: 15.0000 mg | ORAL_TABLET | Freq: Two times a day (BID) | ORAL | Status: DC

## 2019-11-06 MED ORDER — PREDNISONE 10 MG PO TABS: ORAL_TABLET | ORAL | 0 refills | Status: AC

## 2019-11-06 MED ORDER — NICOTINE 14 MG/24HR TD PT24: MEDICATED_PATCH | TRANSDERMAL | 0 refills | Status: DC

## 2019-11-06 MED ORDER — IPRATROPIUM-ALBUTEROL 0.5-2.5 (3) MG/3ML IN SOLN: 3.0000 mL | Freq: Four times a day (QID) | RESPIRATORY_TRACT | Status: DC | PRN

## 2019-11-06 NOTE — Progress Notes (Signed)
Patient refused head-to-toe assessment. Patient removed dressing from right lower extremity against MD's and RN's advice. MD aware.   11/06/2019 10:26 AM  Leonides Cave, RN

## 2019-11-06 NOTE — Progress Notes (Signed)
ANTICOAGULATION CONSULT NOTE  Pharmacy Consult for Rivaroxaban Dosing Indication: DVT  No Known Allergies  Patient Measurements: Height: 5\' 7"  (170.2 cm) Weight: 257 lb (116.6 kg) IBW/kg (Calculated) : 66.1 Heparin Dosing Weight: 92 kg  Vital Signs: Temp: 98.4 F (36.9 C) (03/05 0455) Temp Source: Oral (03/05 0455) BP: 118/73 (03/05 0455) Pulse Rate: 79 (03/05 0455)  Labs: Recent Labs    11/04/19 01/04/20 11/04/19 01/04/20 11/04/19 1156 11/04/19 2021 11/05/19 0625 11/05/19 0625 11/05/19 1508 11/05/19 2209 11/06/19 0426  HGB 16.0   < >  --   --  15.2  --   --   --  16.2  HCT 48.2  --   --   --  46.8  --   --   --  47.1  PLT 153  --   --   --  161  --   --   --  182  APTT 67*   < > 57* 62* 71*  --   --   --   --   LABPROT  --   --  15.0  --   --   --   --   --   --   INR  --   --  1.2  --   --   --   --   --   --   HEPARINUNFRC  --   --   --   --  0.50   < > 0.23* 0.57 0.57  CREATININE 0.67  --   --   --  0.78  --   --   --  0.68   < > = values in this interval not displayed.    Estimated Creatinine Clearance: 136.3 mL/min (by C-G formula based on SCr of 0.68 mg/dL).   Medical History: Past Medical History:  Diagnosis Date  . Arthritis   . Hx of blood clots   . 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 Patient received thrombolysis with tPA and mechanical thrombectomy and started on Xarelto after having been on heparin. Patient presents back with pain to right knee and groin. Ultrasound of the right lower extremity showed extensive DVT throughout vasculature. Per EDP consult with Vascular, start heparin drip. Pharmacy consulted. Patient reports compliance with Xarelto since recent discharge with last dose 3/1 at ~ 0700.  3/5 0426 HL 0.57, therapeutic x 2.  CBC stable.  Continue current rate of 2450 units/hr  Transitioning from Heparin Drip to Rivaroxaban prior  to discharge.  Goal of Therapy:  Heparin level 0.3-0.7 units/ml Monitor platelets by anticoagulation protocol: Yes   Plan:  Will initiate patient on Rivaroxaban 15mg  PO BID with meals x 21 days, then 20mg  PO Daily.  Continue to monitor H&H and platelets with daily labs.  3/26, PharmD Clinical Pharmacist 11/06/2019 8:53 AM

## 2019-11-06 NOTE — Progress Notes (Signed)
Patient ID: Matthew [name], male   DOB: 01/26/1971, 49 y.o.   MRN: [id number] [facility] PROGRESS NOTE  Karim Aiello JOI:[id number] DOB: 1970-12-25 DOA: 11/02/2019 PCP: Rolm Gala, NP  HPI/Subjective: Patient was very upset this morning and wanted to go home immediately.  I told him we still had quite a few things to do including getting him off the oxygen, changing the heparin drip over to Xarelto and getting his medications from medication management.  Vascular surgery did recommend staying another day on the heparin drip.  Patient's late morning had decided now to stay.  Objective: Vitals:   11/05/19 2032 11/06/19 0455  BP: 108/77 118/73  Pulse: 78 79  Resp:  20  Temp: 98.6 F (37 C) 98.4 F (36.9 C)  SpO2: 94% 94%    Intake/Output Summary (Last 24 hours) at 11/06/2019 1255 Last data filed at 11/06/2019 0900 Gross per 24 hour  Intake 1448.1 ml  Output 3075 ml  Net -1626.9 ml   Filed Weights   11/02/19 1352  Weight: 116.6 kg    ROS: Review of Systems  Constitutional: Negative for fever.  Respiratory: Negative for cough and shortness of breath.   Cardiovascular: Negative for chest pain.  Gastrointestinal: Negative for abdominal pain.  Genitourinary: Negative for dysuria.  Musculoskeletal: Negative for back pain and joint pain.  Neurological: Negative for dizziness.   Exam: Physical Exam  Constitutional: He is oriented to person, place, and time.  HENT:  Nose: No mucosal edema.  Mouth/Throat: No oropharyngeal exudate or posterior oropharyngeal edema.  Eyes: Conjunctivae and lids are normal.  Neck: Carotid bruit is not present.  Cardiovascular: S1 normal and S2 normal. Exam reveals no gallop.  No murmur heard. Respiratory: No respiratory distress. He has decreased breath sounds in the right lower field and the left lower field. He has no wheezes. He has no rhonchi. He has no rales.  GI: Soft. Bowel sounds are normal. There is no abdominal tenderness.   Musculoskeletal:     Right knee: Swelling present.     Right ankle: Swelling present.     Left ankle: No swelling.  Lymphadenopathy:    He has no cervical adenopathy.  Neurological: He is alert and oriented to person, place, and time.  Skin: Skin is warm. No rash noted. Nails show no clubbing.  Psychiatric: He has a normal mood and affect.      Data Reviewed: Basic Metabolic Panel: Recent Labs  Lab 11/02/19 1534 11/03/19 0024 11/04/19 0614 11/05/19 0625 11/06/19 0426  NA 137 137 133* 132* 136  K 4.0 4.2 4.8 4.6 4.5  CL 97* 99 93* 92* 96*  CO2 30 31 32 36* 32  GLUCOSE 109* 112* 121* 120* 211*  BUN 9 10 13 12 12   CREATININE 0.80 0.66 0.67 0.78 0.68  CALCIUM 8.8* 8.5* 8.6* 8.7* 8.8*  MG  --  1.5*  --  1.8  --    Liver Function Tests: Recent Labs  Lab 11/02/19 1534 11/06/19 0426  AST 21 26  ALT 13 16  ALKPHOS 129* 156*  BILITOT 2.1* 1.0  PROT 7.7 7.5  ALBUMIN 3.0* 2.7*   CBC: Recent Labs  Lab 11/02/19 1534 11/03/19 0024 11/04/19 0614 11/05/19 0625 11/06/19 0426  WBC 13.7* 13.7* 11.9* 10.7* 15.6*  NEUTROABS 9.4*  --   --   --   --   HGB 18.2* 17.0 16.0 15.2 16.2  HCT 53.5* 50.5 48.2 46.8 47.1  MCV 98.0 98.8 99.8 101.1* 98.3  PLT 155 145*  153 161 182   BNP (last 3 results) Recent Labs    03/10/19 1940  BNP 41.0     Recent Results (from the past 240 hour(s))  Respiratory Panel by RT PCR (Flu A&B, Covid) - Nasopharyngeal Swab     Status: None   Collection Time: 10/29/19 10:09 AM   Specimen: Nasopharyngeal Swab  Result Value Ref Range Status   SARS Coronavirus 2 by RT PCR NEGATIVE NEGATIVE Final    Comment: (NOTE) SARS-CoV-2 target nucleic acids are NOT DETECTED. The SARS-CoV-2 RNA is generally detectable in upper respiratoy specimens during the acute phase of infection. The lowest concentration of SARS-CoV-2 viral copies this assay can detect is 131 copies/mL. A negative result does not preclude SARS-Cov-2 infection and should not be used as  the sole basis for treatment or other patient management decisions. A negative result may occur with  improper specimen collection/handling, submission of specimen other than nasopharyngeal swab, presence of viral mutation(s) within the areas targeted by this assay, and inadequate number of viral copies (<131 copies/mL). A negative result must be combined with clinical observations, patient history, and epidemiological information. The expected result is Negative. Fact Sheet for Patients:  https://www.moore.com/ Fact Sheet for Healthcare Providers:  https://www.young.biz/ This test is not yet ap proved or cleared by the [place] FDA and  has been authorized for detection and/or diagnosis of SARS-CoV-2 by FDA under an Emergency Use Authorization (EUA). This EUA will remain  in effect (meaning this test can be used) for the duration of the COVID-19 declaration under Section 564(b)(1) of the Act, 21 U.S.C. section 360bbb-3(b)(1), unless the authorization is terminated or revoked sooner.    Influenza A by PCR NEGATIVE NEGATIVE Final   Influenza B by PCR NEGATIVE NEGATIVE Final    Comment: (NOTE) The Xpert Xpress SARS-CoV-2/FLU/RSV assay is intended as an aid in  the diagnosis of influenza from Nasopharyngeal swab specimens and  should not be used as a sole basis for treatment. Nasal washings and  aspirates are unacceptable for Xpert Xpress SARS-CoV-2/FLU/RSV  testing. Fact Sheet for Patients: https://www.moore.com/ Fact Sheet for Healthcare Providers: https://www.young.biz/ This test is not yet approved or cleared by the [place] FDA and  has been authorized for detection and/or diagnosis of SARS-CoV-2 by  FDA under an Emergency Use Authorization (EUA). This EUA will remain  in effect (meaning this test can be used) for the duration of the  Covid-19 declaration under Section 564(b)(1) of the Act, 21   U.S.C. section 360bbb-3(b)(1), unless the authorization is  terminated or revoked. Performed at [facility], [address], [place], [place] [address]      Studies: CT ANGIO AO+BIFEM W & OR WO CONTRAST  Result Date: 11/04/2019 CLINICAL DATA:  Arterial embolism. Pain in the right lower extremity. EXAM: CT ANGIOGRAPHY OF ABDOMINAL AORTA WITH ILIOFEMORAL RUNOFF TECHNIQUE: Multidetector CT imaging of the abdomen, pelvis and lower extremities was performed using the standard protocol during bolus administration of intravenous contrast. Multiplanar CT image reconstructions and MIPs were obtained to evaluate the vascular anatomy. CONTRAST:  OMNIPAQUE IOHEXOL 350 MG/ML SOLN COMPARISON:  CT PE study dated March 10, 2019. FINDINGS: VASCULAR Aorta: There are mild atherosclerotic changes throughout the abdominal aorta without evidence for an aneurysm or stenosis. Celiac: Patent without evidence of aneurysm, dissection, vasculitis or significant stenosis. SMA: Patent without evidence of aneurysm, dissection, vasculitis or significant stenosis. Renals: Both renal arteries are patent without evidence of aneurysm, dissection, vasculitis, fibromuscular dysplasia or significant stenosis. IMA: Patent  without evidence of aneurysm, dissection, vasculitis or significant stenosis. RIGHT Lower Extremity Inflow: Common, internal and external iliac arteries are patent without evidence of aneurysm, dissection, vasculitis or significant stenosis. Outflow: Common, superficial and profunda femoral arteries and the popliteal artery are patent without evidence of aneurysm, dissection, vasculitis or significant stenosis. Runoff: Patent three vessel runoff to the ankle. LEFT Lower Extremity Inflow: Common, internal and external iliac arteries are patent without evidence of aneurysm, dissection, vasculitis or significant stenosis. Outflow: Common, superficial and profunda femoral arteries and the popliteal artery are patent  without evidence of aneurysm, dissection, vasculitis or significant stenosis. Runoff: Patent three vessel runoff to the ankle. Veins: There are extensive filling defects within the right common femoral vein and right great saphenous vein. There is extensive stranding about the right superficial femoral vein throughout the majority of its course likely related to acute thrombosis. There is a pocket of gas within the right common femoral vein which may related to recent surgical intervention. The right external iliac vein and common iliac vein both appear to be patent. There is suboptimal opacification of the venous structures of the left lower extremity however there may be some thrombus within the left great saphenous vein. Extensive collateral veins versus varicose veins are noted involving the left lower extremity. Review of the MIP images confirms the above findings. NON-VASCULAR Lower chest: The lung bases are suboptimally evaluated secondary to extensive motion artifact.Heart is significantly enlarged. There is reflux of contrast in the IVC consistent with underlying cardiac dysfunction. Hepatobiliary: There is probable hepatic steatosis. The liver surface appears mildly nodular. Cholelithiasis without acute inflammation.There is no biliary ductal dilation. Pancreas: Normal contours without ductal dilatation. No peripancreatic fluid collection. Spleen: No splenic laceration or hematoma. Adrenals/Urinary Tract: --Adrenal glands: No adrenal hemorrhage. --Right kidney/ureter: No hydronephrosis or perinephric hematoma. --Left kidney/ureter: No hydronephrosis or perinephric hematoma. --Urinary bladder: There is mild bladder wall thickening which is felt to be secondary to underdistention. Stomach/Bowel: --Stomach/Duodenum: There multiple hyperdense structure near the GE junction which are stable from prior study. --Small bowel: No dilatation or inflammation. --Colon: No focal abnormality. --Appendix: Normal.  Lymphatic: --No retroperitoneal lymphadenopathy. --No mesenteric lymphadenopathy. --No pelvic or inguinal lymphadenopathy. Reproductive: Unremarkable Other: No ascites or free air. The abdominal wall is normal. Musculoskeletal. There is extensive asymmetric right lower extremity edema. There is a moderate-sized right-sided joint effusion with synovial enhancement. There are old healed fractures of the left distal tibia and fibula. There are degenerative changes of both mortise joints. Degenerative changes are noted of both knees. IMPRESSION: VASCULAR 1. No acute arterial abnormality. No evidence for an arterial embolism. Minimal atherosclerotic changes are noted. There is a normal 3 vessel runoff bilaterally. 2. Extensive right lower extremity venous thrombosis is again noted. There is a pocket of gas within the right common femoral vein which may related to recent surgical intervention and should be correlated clinically. 3. There is extensive collateral veins versus varicose veins involving the left lower extremity. 4. Well-positioned IVC filter in place. NON-VASCULAR 1. There is probable hepatic steatosis with mildly nodular liver surface, raising concern for underlying cirrhosis. 2. There is cholelithiasis without acute inflammation. 3. There is a moderate-sized right-sided joint effusion with synovial enhancement. Correlation with patient's symptoms and physical exam is recommended. If there is clinical concern for septic arthritis, follow-up with joint aspiration is recommended. 4. Extensive asymmetric right lower extremity edema, presumably related to the patient's known extensive venous thrombosis. 5. Old healed fractures of the left distal tibia and fibula. 6.  Cardiomegaly with reflux of contrast into the IVC consistent with underlying cardiac dysfunction. 7. Aortic Atherosclerosis (ICD10-I70.0). Electronically Signed   By: Constance Holster M.D.   On: 11/04/2019 20:24   PERIPHERAL VASCULAR  CATHETERIZATION  Result Date: 11/05/2019 See op note   Scheduled Meds: . magnesium oxide  800 mg Oral BID  . methylPREDNISolone (SOLU-MEDROL) injection  40 mg Intravenous Daily  . nicotine  14 mg Transdermal Q24H  . sodium chloride flush  3 mL Intravenous Q12H   Continuous Infusions: . heparin 2,450 Units/hr (11/06/19 1243)    Assessment/Plan:  1. Recurrent right lower extremity DVT.  Vascular surgery did thrombolysis and thrombectomy on 11/05/2019.  Patient wanted to leave this morning.  Vascular surgery wanted to keep him another day on heparin drip then convert him over to Xarelto.  Patient late this morning decided to stay.  Case discussed with nursing staff, vascular surgery and pharmacy to keep on heparin drip today.  We will switch over to Xarelto tomorrow.  Patient took off the pressure Ace bandage applied by vascular surgery yesterday and is anticipation of going home this morning.  I ordered TED hose. 2. Mild COPD exacerbation.  Improved with steroids.  Wheeze is gone.  Patient was able to come off oxygen this morning. 3. Paroxysmal atrial fibrillation.  Rate controlled.  Not on any rate controlling medications.  Patient on IV heparin now and Xarelto upon discharge 4. Nodular contour of the liver which can be consistent with cirrhosis.  So far hepatitis C and hepatitis B core antibody negative. 5. Impaired fasting glucose.  Last hemoglobin A1c 5.9.  Patient not a diabetic.  Code Status:     Code Status Orders  (From admission, onward)         Start     Ordered   11/02/19 1744  Full code  Continuous     11/02/19 1745        Code Status History    Date Active Date Inactive Code Status Order ID Comments User Context   10/29/2019 1239 10/31/2019 0131 Full Code [id number]  Ivor Costa, MD Inpatient   03/11/2019 0056 03/14/2019 2005 Full Code [id number]  Mayer Camel, NP ED   02/17/2019 2013 02/19/2019 1555 Full Code [id number]  Fritzi Mandes, MD ED   11/19/2018 2234 11/20/2018 1643  Full Code [id number]  Lance Coon, MD Inpatient   Advance Care Planning Activity     Family Communication: Spoke with spouse on the phone Disposition Plan: This morning patient wanted to leave immediately.  Vascular surgery wanted another day on IV heparin.  Patient now agreeable to stay today.  We will switch over to oral Xarelto tomorrow morning and hopefully discharge tomorrow morning.  Consultants:  Vascular surgery  Time spent: 35 minutes.  Case discussed with vascular surgery, transitional care team, nursing staff and pharmacy  [facility]  [facility]

## 2019-11-06 NOTE — Progress Notes (Addendum)
ANTICOAGULATION CONSULT NOTE  Pharmacy Consult for heparin Indication: DVT  No Known Allergies  Patient Measurements: Height: 5\' 7"  (170.2 cm) Weight: 257 lb (116.6 kg) IBW/kg (Calculated) : 66.1 Heparin Dosing Weight: 92 kg  Vital Signs: Temp: 98.4 F (36.9 C) (03/05 0455) Temp Source: Oral (03/05 0455) BP: 118/73 (03/05 0455) Pulse Rate: 79 (03/05 0455)  Labs: Recent Labs    11/04/19 01/04/20 11/04/19 01/04/20 11/04/19 1156 11/04/19 2021 11/05/19 0625 11/05/19 0625 11/05/19 1508 11/05/19 2209 11/06/19 0426  HGB 16.0   < >  --   --  15.2  --   --   --  16.2  HCT 48.2  --   --   --  46.8  --   --   --  47.1  PLT 153  --   --   --  161  --   --   --  182  APTT 67*   < > 57* 62* 71*  --   --   --   --   LABPROT  --   --  15.0  --   --   --   --   --   --   INR  --   --  1.2  --   --   --   --   --   --   HEPARINUNFRC  --   --   --   --  0.50   < > 0.23* 0.57 0.57  CREATININE 0.67  --   --   --  0.78  --   --   --  0.68   < > = values in this interval not displayed.    Estimated Creatinine Clearance: 136.3 mL/min (by C-G formula based on SCr of 0.68 mg/dL).   Medical History: Past Medical History:  Diagnosis Date  . Arthritis   . Hx of blood clots   . 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 Patient received thrombolysis with tPA and mechanical thrombectomy and started on Xarelto after having been on heparin. Patient presents back with pain to right knee and groin. Ultrasound of the right lower extremity showed extensive DVT throughout vasculature. Per EDP consult with Vascular, start heparin drip. Pharmacy consulted. Patient reports compliance with Xarelto since recent discharge with last dose 3/1 at ~ 0700.  3/2 0024 aPTT 54, subtherapeutic.  H/H ok, PLTs dropped slightly.  Will rebolus with 1000 units x 1 and increase infusion to 1650 units/hr  3/2 0715  aPTT 55, subtherapeutic. Will rebolus with 1200 units x 1 and increase infusion to 1750 units/hr  3/2 @ 1547 aPTT 53, subtherapeutic. Will rebolus with 1200 units x 1 and increase infusion to 1950 units/hr. Platelet level has slightly dropped, however it looks at though patient as a history of low platelet counts.   3/2 2257 aPTT 58, subtherapeutic.  Will rebolus with 1200 units x 1 and increase infusion to 2150 units/hr.  Recheck aPTT 6 hours after rate increase.  3/3 0614 aPTT 67, therapeutic x 1.    3/3 1156 APTT 57 sec. Subtherapeutic. Will rebolus with 1200 units x 1, and will increase rate to 2350 units/hr. Will recheck APTT in 6 hours.   3/3: aPTT @ 2021 = 62 subtherapeutic. Will increase drip rate to 2450 units/hr  3/4@0625 : HL 0.50, APTT 71 sec. Therapeutic x 1. Both levels are currently correlating. Will transition  to dosing based off of HL. Platelets and Hgb remain stable. Will order confirmatory level in 6 hours.  03/04 2209 Hl 0.57 therapeutic  Goal of Therapy:  APTT 66-102 seconds Heparin level 0.3-0.7 units/ml Monitor platelets by anticoagulation protocol: Yes   Plan:  3/4 2209 HL 0.57 therapeutic x 1. Continue heparin at 2450 units/hr. F/u HL in am to confirm  3/5 0426 HL 0.57, therapeutic x 2.  CBC stable.  Continue current rate of 2450 units/hr, recheck HL and CBC qam  Continue to monitor H&H and platelets with daily labs.  Ena Dawley, PharmD Clinical Pharmacist 11/06/2019 5:25 AM

## 2019-11-06 NOTE — Progress Notes (Signed)
[facility] Daily Progress Note   Subjective: 1 Day Post-Op: 1. US guidance for vascular access to right popliteal vein 2. Catheter placement into right common iliac vein from right popliteal approach 3. IVC gram and right lower extremity venogram 4.  Catheter directed thrombolysis with 4 mg of TPA to the right common femoral vein and iliac veins 5. Mechanical thrombectomy to right superficial femoral vein, common femoral vein, external and common iliac veins with penumbra cat 12 device 6. PTA of right superficial femoral vein and common femoral vein with 12 mm balloon 7. PTA of right external and common iliac veins with follow-up mm balloon  No issues overnight.   Objective: Vitals:   11/05/19 1420 11/05/19 1451 11/05/19 2032 11/06/19 0455  BP: 90/72 99/77 108/77 118/73  Pulse: 83 80 78 79  Resp: 18 15  20   Temp:  98.9 F (37.2 C) 98.6 F (37 C) 98.4 F (36.9 C)  TempSrc:  Oral Oral Oral  SpO2: 98% 95% 94% 94%  Weight:      Height:        Intake/Output Summary (Last 24 hours) at 11/06/2019 1016 Last data filed at 11/06/2019 0900 Gross per 24 hour  Intake 1448.1 ml  Output 3450 ml  Net -2001.9 ml   Physical Exam: A&Ox3, NAD CV: RRR Pulmonary: CTA Bilaterally Abdomen: Soft, Nontender, Nondistended Vascular:  Right Lower Extremity: Patient had removed dressing. Moderate edema, bluish tint to foot.   Laboratory: CBC    Component Value Date/Time   WBC 15.6 (H) 11/06/2019 0426   HGB 16.2 11/06/2019 0426   HGB 17.8 (H) 09/10/2018 0930   HCT 47.1 11/06/2019 0426   HCT 51.3 (H) 09/10/2018 0930   PLT 182 11/06/2019 0426   PLT 233 09/10/2018 0930   BMET    Component Value Date/Time   NA 136 11/06/2019 0426   NA 143 09/10/2018 0930   K 4.5 11/06/2019 0426   CL 96 (L) 11/06/2019 0426   CO2 32 11/06/2019 0426   GLUCOSE 211 (H) 11/06/2019 0426   BUN 12 11/06/2019 0426   BUN 8 09/10/2018 0930   CREATININE 0.68 11/06/2019 0426   CALCIUM 8.8 (L) 11/06/2019 0426   GFRNONAA >60 11/06/2019 0426   GFRAA >60 11/06/2019 0426   Assessment/Planning: The patient is a 49 year old male with a known history of DVT requiring endovascular venous thrombectomy / lysis with IVC filter placement.  Patient presented to [facility] emergency department for recurrent right lower extremity DVT s/p RLE thrombectomy / thrombolysis - POD#1  1) Patient has removed the compression wrap from his RLE on his own. Explained the importance of the compression dressing in regard to controlling swelling / improving discomfort. Plan was to keep compression dressing in place until discharge. Refused me replacing it stating he is going home.   2) Explained the importance of continue IV heparin for one more day before transitioning to PO oral anticoagulation (due to the reoccurrence of his DVT). Again, the patient refused stating he was going home as he has been in the hospital since Monday. The patient also stating the staff here are "assholes".   Will sign off at this time.  Discussed with Dr. Friday Maddalynn Barnard PA-C 11/06/2019 10:16 AM

## 2019-11-06 NOTE — Progress Notes (Signed)
ANTICOAGULATION CONSULT NOTE  Pharmacy Consult for heparin Indication: DVT  No Known Allergies  Patient Measurements: Height: 5\' 7"  (170.2 cm) Weight: 257 lb (116.6 kg) IBW/kg (Calculated) : 66.1 Heparin Dosing Weight: 92 kg  Vital Signs: Temp: 98.4 F (36.9 C) (03/05 0455) Temp Source: Oral (03/05 0455) BP: 118/73 (03/05 0455) Pulse Rate: 79 (03/05 0455)  Labs: Recent Labs    11/04/19 01/04/20 11/04/19 01/04/20 11/04/19 1156 11/04/19 2021 11/05/19 0625 11/05/19 0625 11/05/19 1508 11/05/19 2209 11/06/19 0426  HGB 16.0   < >  --   --  15.2  --   --   --  16.2  HCT 48.2  --   --   --  46.8  --   --   --  47.1  PLT 153  --   --   --  161  --   --   --  182  APTT 67*   < > 57* 62* 71*  --   --   --   --   LABPROT  --   --  15.0  --   --   --   --   --   --   INR  --   --  1.2  --   --   --   --   --   --   HEPARINUNFRC  --   --   --   --  0.50   < > 0.23* 0.57 0.57  CREATININE 0.67  --   --   --  0.78  --   --   --  0.68   < > = values in this interval not displayed.    Estimated Creatinine Clearance: 136.3 mL/min (by C-G formula based on SCr of 0.68 mg/dL).   Medical History: Past Medical History:  Diagnosis Date  . Arthritis   . Hx of blood clots   . 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 Patient received thrombolysis with tPA and mechanical thrombectomy and started on Xarelto after having been on heparin. Patient presents back with pain to right knee and groin. Ultrasound of the right lower extremity showed extensive DVT throughout vasculature. Per EDP consult with Vascular, start heparin drip. Pharmacy consulted. Patient reports compliance with Xarelto since recent discharge with last dose 3/1 at ~ 0700.  3/2 0024 aPTT 54, subtherapeutic.  H/H ok, PLTs dropped slightly.  Will rebolus with 1000 units x 1 and increase infusion to 1650 units/hr  3/2 0715  aPTT 55, subtherapeutic. Will rebolus with 1200 units x 1 and increase infusion to 1750 units/hr  3/2 @ 1547 aPTT 53, subtherapeutic. Will rebolus with 1200 units x 1 and increase infusion to 1950 units/hr. Platelet level has slightly dropped, however it looks at though patient as a history of low platelet counts.   3/2 2257 aPTT 58, subtherapeutic.  Will rebolus with 1200 units x 1 and increase infusion to 2150 units/hr.  Recheck aPTT 6 hours after rate increase.  3/3 0614 aPTT 67, therapeutic x 1.    3/3 1156 APTT 57 sec. Subtherapeutic. Will rebolus with 1200 units x 1, and will increase rate to 2350 units/hr. Will recheck APTT in 6 hours.   3/3: aPTT @ 2021 = 62 subtherapeutic. Will increase drip rate to 2450 units/hr  3/4@0625 : HL 0.50, APTT 71 sec. Therapeutic x 1. Both levels are currently correlating. Will transition  to dosing based off of HL. Platelets and Hgb remain stable. Will order confirmatory level in 6 hours.  03/04 2209 Hl 0.57 therapeutic  3/5 0426 HL 0.57, therapeutic x 2.  CBC stable.    Goal of Therapy:  APTT 66-102 seconds Heparin level 0.3-0.7 units/ml Monitor platelets by anticoagulation protocol: Yes   Plan:  Heparin drip history as above, but stopped this am 3/5 as patient was going to leave and restart Xarelto, but patient is now staying x 1 more day - no doses of Xarelto given (though were ordered) and heparin drip was stopped ~ 2 hours per nursing  Will rebolus 4000 units (due to held infusion) and then  Continue current rate of 2450 units/hr, recheck HL in 6 hours and CBC qam  Continue to monitor H&H and platelets with daily labs.  Lu Duffel, PharmD Clinical Pharmacist 11/06/2019 11:55 AM

## 2019-11-06 NOTE — Discharge Summary (Signed)
[facility] NAME: Matthew [name]    MR#:  [id number]  DATE OF BIRTH:  1970-11-19  DATE OF ADMISSION:  11/02/2019 ADMITTING PHYSICIAN: Wyvonnia Dusky, MD  DATE OF DISCHARGE: 11/06/2019  PRIMARY CARE PHYSICIAN: Langston Reusing, NP    ADMISSION DIAGNOSIS:  DVT (deep venous thrombosis) (HCC) [I82.409] DVT of deep femoral vein, right (HCC) [I82.411]  DISCHARGE DIAGNOSIS:  Active Problems:   DVT of deep femoral vein, right (HCC)   AF (paroxysmal atrial fibrillation) (HCC)   DVT (deep venous thrombosis) (HCC)   Wheeze   Hypomagnesemia   Other cirrhosis of liver (HCC)   COPD with acute exacerbation ([place])   SECONDARY DIAGNOSIS:   Past Medical History:  Diagnosis Date  . Arthritis   . Hx of blood clots   . Sleep apnea    patient states not anymore    HOSPITAL COURSE:   1.  Recurrent right lower extremity DVT.  The patient was admitted to the hospital and started on heparin drip.  Vascular surgery did a thrombolysis and thrombectomy on 11/05/2019.  The patient wanted to leave earlier this morning.  Vascular surgery wanted to stay another day on IV heparin drip.  The patient was initially agreeable to this but then changed his mind and wanted to go home this evening.  The patient was given a stat dose of Xarelto.  Medication management had already brought up the Xarelto starter pack.  And I recommended taking the 21 days of 50 mg twice a day and then 20 mg daily after that.  The patient took off his pressure Ace bandage done by vascular surgery after the procedure this morning.  I did prescribe TED hose.  The patient did not want to use any other anticoagulation besides the Xarelto.  We talked about changing anticoagulation over to Coumadin but he stated he would not take that because it is rat poison.  The patient did not want to take Eliquis secondary to cost. 2.  Mild COPD exacerbation.  This has improved with steroids.  His  wheeze is gone.  I was able to prescribe a few more doses of steroid upon going home.  Patient was tapered off the oxygen this morning. 3.  Paroxysmal atrial fibrillation.  The rate is controlled.  He is not on any rate controlling medications.  Patient was on IV heparin while here and will be on Xarelto upon going home. 4.  Nodular contour of the liver which is consistent with cirrhosis.  Hepatitis C is negative and hepatitis B core antibody is also negative.  Recommend following up with gastroenterology as outpatient. 5.  Impaired fasting glucose.  Last hemoglobin A1c 5.9.  Patient not a diabetic.  Sugars high secondary to steroids. 6.  Obesity with a BMI of 40.25  DISCHARGE CONDITIONS:   Fair.  CONSULTS OBTAINED:  Vascular surgery  DRUG ALLERGIES:  No Known Allergies  DISCHARGE MEDICATIONS:   Allergies as of 11/06/2019   No Known Allergies     Medication List    STOP taking these medications   Rivaroxaban 15 MG Tabs tablet Commonly known as: XARELTO Replaced by: Rivaroxaban 15 & 20 MG Tbpk   rivaroxaban 20 MG Tabs tablet Commonly known as: XARELTO     TAKE these medications   albuterol 108 (90 Base) MCG/ACT inhaler Commonly known as: VENTOLIN HFA Inhale 2 puffs into the lungs every 6 (six) hours as needed for wheezing or shortness of breath.  nicotine 14 mg/24hr patch Commonly known as: NICODERM CQ - dosed in mg/24 hours One 14mg  patch chest wall daily (okay to substitute generic)   predniSONE 10 MG tablet Commonly known as: DELTASONE 4 tabs po daily for three more days then stop Start taking on: November 07, 2019   Rivaroxaban 15 & 20 MG Tbpk Follow package directions: Take one 15mg  tablet by mouth twice a day. On day 22, switch to one 20mg  tablet once a day. Take with food. Replaces: Rivaroxaban 15 MG Tabs tablet        DISCHARGE INSTRUCTIONS:   Follow-up with vascular surgery as scheduled on 11/23/2019 Follow-up PMD 5 days   If you experience worsening of  your admission symptoms, develop shortness of breath, life threatening emergency, suicidal or homicidal thoughts you must seek medical attention immediately by calling 911 or calling your MD immediately  if symptoms less severe.  You Must read complete instructions/literature along with all the possible adverse reactions/side effects for all the Medicines you take and that have been prescribed to you. Take any new Medicines after you have completely understood and accept all the possible adverse reactions/side effects.   Please note  You were cared for by a hospitalist during your hospital stay. If you have any questions about your discharge medications or the care you received while you were in the hospital after you are discharged, you can call the unit and asked to speak with the hospitalist on call if the hospitalist that took care of you is not available. Once you are discharged, your primary care physician will handle any further medical issues. Please note that NO REFILLS for any discharge medications will be authorized once you are discharged, as it is imperative that you return to your primary care physician (or establish a relationship with a primary care physician if you do not have one) for your aftercare needs so that they can reassess your need for medications and monitor your lab values.    Today   CHIEF COMPLAINT:   Chief Complaint  Patient presents with  . Knee Pain    HISTORY OF PRESENT ILLNESS:  Matthew [name]  is a 49 y.o. male came in with leg pain   VITAL SIGNS:  Blood pressure 118/73, pulse 79, temperature 98.4 F (36.9 C), temperature source Oral, resp. rate 20, height 5\' 7"  (1.702 m), weight 116.6 kg, SpO2 94 %.  I/O:    Intake/Output Summary (Last 24 hours) at 11/06/2019 1757 Last data filed at 11/06/2019 0900 Gross per 24 hour  Intake 650 ml  Output 3075 ml  Net -2425 ml     DATA REVIEW:   CBC Recent Labs  Lab 11/06/19 0426  WBC 15.6*  HGB 16.2   HCT 47.1  PLT 182    Chemistries  Recent Labs  Lab 11/05/19 0625 11/05/19 0625 11/06/19 0426  NA 132*   < > 136  K 4.6   < > 4.5  CL 92*   < > 96*  CO2 36*   < > 32  GLUCOSE 120*   < > 211*  BUN 12   < > 12  CREATININE 0.78   < > 0.68  CALCIUM 8.7*   < > 8.8*  MG 1.8  --   --   AST  --   --  26  ALT  --   --  16  ALKPHOS  --   --  156*  BILITOT  --   --  1.0   < > =  values in this interval not displayed.    Microbiology Results  Results for orders placed or performed during the hospital encounter of 10/29/19  Respiratory Panel by RT PCR (Flu A&B, Covid) - Nasopharyngeal Swab     Status: None   Collection Time: 10/29/19 10:09 AM   Specimen: Nasopharyngeal Swab  Result Value Ref Range Status   SARS Coronavirus 2 by RT PCR NEGATIVE NEGATIVE Final    Comment: (NOTE) SARS-CoV-2 target nucleic acids are NOT DETECTED. The SARS-CoV-2 RNA is generally detectable in upper respiratoy specimens during the acute phase of infection. The lowest concentration of SARS-CoV-2 viral copies this assay can detect is 131 copies/mL. A negative result does not preclude SARS-Cov-2 infection and should not be used as the sole basis for treatment or other patient management decisions. A negative result may occur with  improper specimen collection/handling, submission of specimen other than nasopharyngeal swab, presence of viral mutation(s) within the areas targeted by this assay, and inadequate number of viral copies (<131 copies/mL). A negative result must be combined with clinical observations, patient history, and epidemiological information. The expected result is Negative. Fact Sheet for Patients:  https://www.moore.com/ Fact Sheet for Healthcare Providers:  https://www.young.biz/ This test is not yet ap proved or cleared by the [place] FDA and  has been authorized for detection and/or diagnosis of SARS-CoV-2 by FDA under an Emergency Use  Authorization (EUA). This EUA will remain  in effect (meaning this test can be used) for the duration of the COVID-19 declaration under Section 564(b)(1) of the Act, 21 U.S.C. section 360bbb-3(b)(1), unless the authorization is terminated or revoked sooner.    Influenza A by PCR NEGATIVE NEGATIVE Final   Influenza B by PCR NEGATIVE NEGATIVE Final    Comment: (NOTE) The Xpert Xpress SARS-CoV-2/FLU/RSV assay is intended as an aid in  the diagnosis of influenza from Nasopharyngeal swab specimens and  should not be used as a sole basis for treatment. Nasal washings and  aspirates are unacceptable for Xpert Xpress SARS-CoV-2/FLU/RSV  testing. Fact Sheet for Patients: https://www.moore.com/ Fact Sheet for Healthcare Providers: https://www.young.biz/ This test is not yet approved or cleared by the [place] FDA and  has been authorized for detection and/or diagnosis of SARS-CoV-2 by  FDA under an Emergency Use Authorization (EUA). This EUA will remain  in effect (meaning this test can be used) for the duration of the  Covid-19 declaration under Section 564(b)(1) of the Act, 21  U.S.C. section 360bbb-3(b)(1), unless the authorization is  terminated or revoked. Performed at [facility], [address] [place], [place] [address]     RADIOLOGY:  CT ANGIO AO+BIFEM W & OR WO CONTRAST  Result Date: 11/04/2019 CLINICAL DATA:  Arterial embolism. Pain in the right lower extremity. EXAM: CT ANGIOGRAPHY OF ABDOMINAL AORTA WITH ILIOFEMORAL RUNOFF TECHNIQUE: Multidetector CT imaging of the abdomen, pelvis and lower extremities was performed using the standard protocol during bolus administration of intravenous contrast. Multiplanar CT image reconstructions and MIPs were obtained to evaluate the vascular anatomy. CONTRAST:  OMNIPAQUE IOHEXOL 350 MG/ML SOLN COMPARISON:  CT PE study dated March 10, 2019. FINDINGS: VASCULAR Aorta: There are mild  atherosclerotic changes throughout the abdominal aorta without evidence for an aneurysm or stenosis. Celiac: Patent without evidence of aneurysm, dissection, vasculitis or significant stenosis. SMA: Patent without evidence of aneurysm, dissection, vasculitis or significant stenosis. Renals: Both renal arteries are patent without evidence of aneurysm, dissection, vasculitis, fibromuscular dysplasia or significant stenosis. IMA: Patent without evidence of aneurysm, dissection, vasculitis  or significant stenosis. RIGHT Lower Extremity Inflow: Common, internal and external iliac arteries are patent without evidence of aneurysm, dissection, vasculitis or significant stenosis. Outflow: Common, superficial and profunda femoral arteries and the popliteal artery are patent without evidence of aneurysm, dissection, vasculitis or significant stenosis. Runoff: Patent three vessel runoff to the ankle. LEFT Lower Extremity Inflow: Common, internal and external iliac arteries are patent without evidence of aneurysm, dissection, vasculitis or significant stenosis. Outflow: Common, superficial and profunda femoral arteries and the popliteal artery are patent without evidence of aneurysm, dissection, vasculitis or significant stenosis. Runoff: Patent three vessel runoff to the ankle. Veins: There are extensive filling defects within the right common femoral vein and right great saphenous vein. There is extensive stranding about the right superficial femoral vein throughout the majority of its course likely related to acute thrombosis. There is a pocket of gas within the right common femoral vein which may related to recent surgical intervention. The right external iliac vein and common iliac vein both appear to be patent. There is suboptimal opacification of the venous structures of the left lower extremity however there may be some thrombus within the left great saphenous vein. Extensive collateral veins versus varicose veins are  noted involving the left lower extremity. Review of the MIP images confirms the above findings. NON-VASCULAR Lower chest: The lung bases are suboptimally evaluated secondary to extensive motion artifact.Heart is significantly enlarged. There is reflux of contrast in the IVC consistent with underlying cardiac dysfunction. Hepatobiliary: There is probable hepatic steatosis. The liver surface appears mildly nodular. Cholelithiasis without acute inflammation.There is no biliary ductal dilation. Pancreas: Normal contours without ductal dilatation. No peripancreatic fluid collection. Spleen: No splenic laceration or hematoma. Adrenals/Urinary Tract: --Adrenal glands: No adrenal hemorrhage. --Right kidney/ureter: No hydronephrosis or perinephric hematoma. --Left kidney/ureter: No hydronephrosis or perinephric hematoma. --Urinary bladder: There is mild bladder wall thickening which is felt to be secondary to underdistention. Stomach/Bowel: --Stomach/Duodenum: There multiple hyperdense structure near the GE junction which are stable from prior study. --Small bowel: No dilatation or inflammation. --Colon: No focal abnormality. --Appendix: Normal. Lymphatic: --No retroperitoneal lymphadenopathy. --No mesenteric lymphadenopathy. --No pelvic or inguinal lymphadenopathy. Reproductive: Unremarkable Other: No ascites or free air. The abdominal wall is normal. Musculoskeletal. There is extensive asymmetric right lower extremity edema. There is a moderate-sized right-sided joint effusion with synovial enhancement. There are old healed fractures of the left distal tibia and fibula. There are degenerative changes of both mortise joints. Degenerative changes are noted of both knees. IMPRESSION: VASCULAR 1. No acute arterial abnormality. No evidence for an arterial embolism. Minimal atherosclerotic changes are noted. There is a normal 3 vessel runoff bilaterally. 2. Extensive right lower extremity venous thrombosis is again noted. There  is a pocket of gas within the right common femoral vein which may related to recent surgical intervention and should be correlated clinically. 3. There is extensive collateral veins versus varicose veins involving the left lower extremity. 4. Well-positioned IVC filter in place. NON-VASCULAR 1. There is probable hepatic steatosis with mildly nodular liver surface, raising concern for underlying cirrhosis. 2. There is cholelithiasis without acute inflammation. 3. There is a moderate-sized right-sided joint effusion with synovial enhancement. Correlation with patient's symptoms and physical exam is recommended. If there is clinical concern for septic arthritis, follow-up with joint aspiration is recommended. 4. Extensive asymmetric right lower extremity edema, presumably related to the patient's known extensive venous thrombosis. 5. Old healed fractures of the left distal tibia and fibula. 6. Cardiomegaly with reflux of contrast into  the IVC consistent with underlying cardiac dysfunction. 7. Aortic Atherosclerosis (ICD10-I70.0). Electronically Signed   By: Katherine Mantle M.D.   On: 11/04/2019 20:24   PERIPHERAL VASCULAR CATHETERIZATION  Result Date: 11/05/2019 See op note    Patient told me he is going home.  I did discuss his case earlier with his wife.  CODE STATUS:     Code Status Orders  (From admission, onward)         Start     Ordered   11/02/19 1744  Full code  Continuous     11/02/19 1745        Code Status History    Date Active Date Inactive Code Status Order ID Comments User Context   10/29/2019 1239 10/31/2019 0131 Full Code [id number]  Lorretta Harp, MD Inpatient   03/11/2019 0056 03/14/2019 2005 Full Code [id number]  Pearletha Alfred, NP ED   02/17/2019 2013 02/19/2019 1555 Full Code [id number]  Enedina Finner, MD ED   11/19/2018 2234 11/20/2018 1643 Full Code [id number]  Oralia Manis, MD Inpatient   Advance Care Planning Activity      TOTAL TIME TAKING CARE OF THIS PATIENT: 38  minutes.    Alford Highland M.D on 11/06/2019 at 5:57 PM  Between 7am to 6pm - Pager - [phone]  After 6pm go to [email or web address] - password [email or web address]  [facility]  CC: Primary care physician; Rolm Gala, NP

## 2019-11-06 NOTE — Progress Notes (Signed)
PT Cancellation Note  Patient Details Name: Matthew [name] MRN: [id number] DOB: Nov 06, 1970   Cancelled Treatment:    Reason Eval/Treat Not Completed: Other (comment).  Nurse reports pt is now going to stay and heparin drip restarted and TED hose ordered and donned.  Nurse recommending holding PT today and re-attempt PT evaluation tomorrow.  Will re-attempt PT evaluation tomorrow as able.  Hendricks Limes, PT 11/06/19, 2:09 PM

## 2019-11-06 NOTE — Progress Notes (Signed)
Matthew [name] to be D/C'd Home per MD order.  Discussed prescriptions and follow up appointments with the patient. Prescriptions given to patient, medication list explained in detail. Pt verbalized understanding.  Allergies as of 11/06/2019   No Known Allergies     Medication List    STOP taking these medications   Rivaroxaban 15 MG Tabs tablet Commonly known as: XARELTO Replaced by: Rivaroxaban 15 & 20 MG Tbpk   rivaroxaban 20 MG Tabs tablet Commonly known as: XARELTO     TAKE these medications   albuterol 108 (90 Base) MCG/ACT inhaler Commonly known as: VENTOLIN HFA Inhale 2 puffs into the lungs every 6 (six) hours as needed for wheezing or shortness of breath.   nicotine 14 mg/24hr patch Commonly known as: NICODERM CQ - dosed in mg/24 hours One 14mg  patch chest wall daily (okay to substitute generic)   predniSONE 10 MG tablet Commonly known as: DELTASONE 4 tabs po daily for three more days then stop Start taking on: November 07, 2019   Rivaroxaban 15 & 20 MG Tbpk Follow package directions: Take one 15mg  tablet by mouth twice a day. On day 22, switch to one 20mg  tablet once a day. Take with food. Replaces: Rivaroxaban 15 MG Tabs tablet       Vitals:   11/05/19 2032 11/06/19 0455  BP: 108/77 118/73  Pulse: 78 79  Resp:  20  Temp: 98.6 F (37 C) 98.4 F (36.9 C)  SpO2: 94% 94%    Skin clean, dry and intact without evidence of skin break down, no evidence of skin tears noted. IV catheter discontinued intact. Site without signs and symptoms of complications. Dressing and pressure applied. Pt denies pain at this time. No complaints noted.  An After Visit Summary was printed and given to the patient. Patient escorted via WC, and D/C home via private auto.  01/05/20 11/06/2019 7:48 PM

## 2019-11-06 NOTE — Progress Notes (Signed)
PT Cancellation Note  Patient Details Name: Matthew [name] MRN: [id number] DOB: May 01, 1971   Cancelled Treatment:    Reason Eval/Treat Not Completed: Other (comment).  PT consult received.  Chart reviewed.  Pt eating breakfast upon PT arrival.  Therapist told pt she would come back later since pt was eating breakfast but pt reporting wanting to discharge today and reported he would participate in therapy now.  Pt reporting needing to remove R LE dressing before getting up.  Therapist advised pt that he should keep R LE dressing on until verified with Vascular.  Pt reporting he could not get OOB with R LE dressing on d/t restricting movement.  Therapist told pt she needed to clarify whether pt should have R LE dressing removed and pt reported he was going to take it off anyway and that he knew what he was doing and was allowed to do it.  D/t pt adamant on taking dressing off, therapist notified pt's nurse immediately to let her know and pt's nurse reported she would go in to talk with pt (and would verify if it was appropriate to take it off).  Will re-attempt PT evaluation at a later date/time.  Hendricks Limes, PT 11/06/19, 11:57 AM

## 2019-11-06 NOTE — TOC Transition Note (Addendum)
Transition of Care [facility]) - CM/SW Discharge Note   Patient Details  Name: Matthew [name] MRN: [id number] Date of Birth: [birth year]  [facility] CM/SW Contact:  Chapman Fitch, RN Phone Number: 11/06/2019, 10:30 AM   Clinical Narrative:    Patient to discharge home today Prescriptions have been electronically sent to Medication Management  RNCM confirmed that [facility]  Have all of his prescriptions in stock  Patient to pick up medications after discharge   Update: discharge canceled.  Keri from [facility]  To deliver medications to beside RN today so patient will have them for discharge over the weekend.   Final next level of care: Home/Self Care Barriers to Discharge: No Barriers Identified   Patient Goals and CMS Choice        Discharge Placement                       Discharge Plan and Services   Discharge Planning Services: CM Consult                                 Social Determinants of Health (SDOH) Interventions     Readmission Risk Interventions Readmission Risk Prevention Plan 11/03/2019 11/20/2018  Transportation Screening Complete Complete  PCP or Specialist Appt within 3-5 Days - (No Data)  HRI or Home Care Consult - (No Data)  Social Work Consult for Recovery Care Planning/Counseling - (No Data)  Palliative Care Screening Not Applicable Not Applicable  Medication Review [employer]) Complete Complete

## 2019-11-07 LAB — HEPATITIS B SURFACE ANTIBODY, QUANTITATIVE: Hep B S AB Quant (Post): <3.1 m[IU]/mL — ABNORMAL LOW (ref >9.9)

## 2019-11-09 LAB — MITOCHONDRIAL ANTIBODIES: Mitochondrial M2 Ab, IgG: <20 Units (ref 0.0–20.0)

## 2019-11-19 ENCOUNTER — Telehealth: Payer: Self-pay | Admitting: Gerontology

## 2019-11-19 NOTE — Telephone Encounter (Signed)
Pt was called on 3/17 and 3/18 to schedule a follow up appt. Both calls could not be completed and we have been unable to reach pt

## 2019-11-23 ENCOUNTER — Other Ambulatory Visit: Payer: Self-pay

## 2019-11-23 ENCOUNTER — Ambulatory Visit (INDEPENDENT_AMBULATORY_CARE_PROVIDER_SITE_OTHER): Payer: Self-pay | Admitting: Nurse Practitioner

## 2019-11-23 ENCOUNTER — Ambulatory Visit (INDEPENDENT_AMBULATORY_CARE_PROVIDER_SITE_OTHER): Payer: Self-pay

## 2019-11-23 ENCOUNTER — Encounter (INDEPENDENT_AMBULATORY_CARE_PROVIDER_SITE_OTHER): Payer: Self-pay | Admitting: Nurse Practitioner

## 2019-11-23 VITALS — BP 114/73 | HR 84 | Ht 67.0 in | Wt 280.0 lb

## 2019-11-23 DIAGNOSIS — Z86718 Personal history of other venous thrombosis and embolism: Secondary | ICD-10-CM

## 2019-11-23 DIAGNOSIS — R600 Localized edema: Secondary | ICD-10-CM

## 2019-11-23 DIAGNOSIS — I82411 Acute embolism and thrombosis of right femoral vein: Secondary | ICD-10-CM

## 2019-11-23 DIAGNOSIS — J441 Chronic obstructive pulmonary disease with (acute) exacerbation: Secondary | ICD-10-CM

## 2019-11-23 DIAGNOSIS — Z720 Tobacco use: Secondary | ICD-10-CM

## 2019-11-27 ENCOUNTER — Encounter (INDEPENDENT_AMBULATORY_CARE_PROVIDER_SITE_OTHER): Payer: Self-pay | Admitting: Nurse Practitioner

## 2019-11-27 NOTE — Progress Notes (Signed)
SUBJECTIVE:  Patient ID: Matthew [name], male    DOB: [birth year], 49 y.o.   MRN: [id number] Chief Complaint  Patient presents with  . Follow-up    U/S Follow up    HPI  Matthew [name] is a 49 y.o. male that initially presented to [facility] on 10/29/2019 with pain and swelling and was found to have extensive DVT extending from his iliac to popliteal veins.  The patient underwent thrombectomy on 10/30/2019.  The patient had an IVC filter in place that was patent.  The patient subsequently presented to [facility] again on 11/02/2019 with a recurrence of this right lower extremity DVT.  The patient underwent thrombectomy again as well as angioplasty of the iliac and femoral veins.  The patient states that he was on Xarelto and was compliant however the patient has had history of noncompliance in the past.  Today the patient presents and is having swelling of his right lower extremity however it is not as painful.  The patient does report taking his Xarelto as prescribed.  The patient underwent noninvasive studies today which revealed an occlusive thrombus from the common femoral vein, saphenofemoral junction as well as the proximal superficial femoral vein.  There is partial compression in the mid superficial femoral vein as well as the popliteal vein.  Due to extensive swelling the popliteal veins are not well visualized.  Past Medical History:  Diagnosis Date  . Arthritis   . Hx of blood clots   . Sleep apnea    patient states not anymore    Past Surgical History:  Procedure Laterality Date  . IVC FILTER INSERTION Left 11/24/2018   Procedure: IVC FILTER INSERTION;  Surgeon: Annice Needy, MD;  Location: [facility] INVASIVE CV LAB;  Service: Cardiovascular;  Laterality: Left;  . IVC FILTER INSERTION Right 02/18/2019   Procedure: IVC FILTER INSERTION WITH RIGHT LOWER EXTREMITY VENOUS LYSIS;  Surgeon: Renford Dills, MD;  Location: [facility] INVASIVE CV  LAB;  Service: Cardiovascular;  Laterality: Right;  . IVC FILTER REMOVAL N/A 12/01/2018   Procedure: IVC FILTER REMOVAL;  Surgeon: Annice Needy, MD;  Location: [facility] INVASIVE CV LAB;  Service: Cardiovascular;  Laterality: N/A;  . NO PAST SURGERIES    . PERIPHERAL VASCULAR THROMBECTOMY Right 10/30/2019   Procedure: PERIPHERAL VASCULAR THROMBECTOMY / THROMBOLYSIS;  Surgeon: Annice Needy, MD;  Location: [facility] INVASIVE CV LAB;  Service: Cardiovascular;  Laterality: Right;  . PERIPHERAL VASCULAR THROMBECTOMY Right 11/05/2019   Procedure: PERIPHERAL VASCULAR THROMBECTOMY / THROMBOLYSIS;  Surgeon: Annice Needy, MD;  Location: [facility] INVASIVE CV LAB;  Service: Cardiovascular;  Laterality: Right;    Social History   Socioeconomic History  . Marital status: Married    Spouse name: Elease Hashimoto  . Number of children: 4  . Years of education: Not on file  . Highest education level: Not on file  Occupational History  . Not on file  Tobacco Use  . Smoking status: Current Every Day Smoker    Packs/day: 0.50    Types: Cigarettes  . Smokeless tobacco: Current User  Substance and Sexual Activity  . Alcohol use: Yes    Comment: weekends says every third weekend a 12 pack  . Drug use: Never  . Sexual activity: Yes  Other Topics Concern  . Not on file  Social History Narrative  . Not on file   Social Determinants of Health   Financial Resource Strain: Low Risk   . Difficulty of Paying Living Expenses:  Not hard at all  Food Insecurity: No Food Insecurity  . Worried About Programme researcher, broadcasting/film/video in the Last Year: Never true  . Ran Out of Food in the Last Year: Never true  Transportation Needs: No Transportation Needs  . Lack of Transportation (Medical): No  . Lack of Transportation (Non-Medical): No  Physical Activity:   . Days of Exercise per Week:   . Minutes of Exercise per Session:   Stress: No Stress Concern Present  . Feeling of Stress : Only a little  Social Connections: Unknown  . Frequency of  Communication with Friends and Family: More than three times a week  . Frequency of Social Gatherings with Friends and Family: Not on file  . Attends Religious Services: Not on file  . Active Member of Clubs or Organizations: Not on file  . Attends Banker Meetings: Not on file  . Marital Status: Not on file  Intimate Partner Violence: Not At Risk  . Fear of Current or Ex-Partner: No  . Emotionally Abused: No  . Physically Abused: No  . Sexually Abused: No    History reviewed. No pertinent family history.  No Known Allergies   Review of Systems   Review of Systems: Negative Unless Checked Constitutional: [] Weight loss  [] Fever  [] Chills Cardiac: [] Chest pain   []  Atrial Fibrillation  [] Palpitations   [] Shortness of breath when laying flat   [] Shortness of breath with exertion. [] Shortness of breath at rest Vascular:  [] Pain in legs with walking   [] Pain in legs with standing [] Pain in legs when laying flat   [] Claudication    [] Pain in feet when laying flat    [x] History of DVT   [] Phlebitis   [x] Swelling in legs   [] Varicose veins   [] Non-healing ulcers Pulmonary:   [] Uses home oxygen   [] Productive cough   [] Hemoptysis   [] Wheeze  [x] COPD   [] Asthma Neurologic:  [] Dizziness   [] Seizures  [] Blackouts [] History of stroke   [] History of TIA  [] Aphasia   [] Temporary Blindness   [] Weakness or numbness in arm   [] Weakness or numbness in leg Musculoskeletal:   [] Joint swelling   [] Joint pain   [] Low back pain  []  History of Knee Replacement [] Arthritis [] back Surgeries  []  Spinal Stenosis    Hematologic:  [] Easy bruising  [] Easy bleeding   [] Hypercoagulable state   [] Anemic Gastrointestinal:  [] Diarrhea   [] Vomiting  [] Gastroesophageal reflux/heartburn   [] Difficulty swallowing. [] Abdominal pain Genitourinary:  [] Chronic kidney disease   [] Difficult urination  [] Anuric   [] Blood in urine [] Frequent urination  [] Burning with urination   [] Hematuria Skin:  [] Rashes   [] Ulcers  [] Wounds Psychological:  [] History of anxiety   []  History of major depression  []  Memory Difficulties      OBJECTIVE:   Physical Exam  BP 114/73   Pulse 84   Ht 5\' 7"  (1.702 m)   Wt 280 lb (127 kg)   BMI 43.85 kg/m   Gen: WD/WN, NAD Head: [place]/AT, No temporalis wasting.  Ear/Nose/Throat: Hearing grossly intact, nares w/o erythema or drainage Eyes: PER, EOMI, sclera nonicteric.  Neck: Supple, no masses.  No JVD.  Pulmonary:  Good air movement, no use of accessory muscles.  Cardiac: RRR Vascular: 3+ hard edema right leg, 2+ left Vessel Right Left  Radial Palpable Palpable  Gastrointestinal: soft, non-distended. No guarding/no peritoneal signs.  Musculoskeletal: M/S 5/5 throughout.  No deformity or atrophy.  Neurologic: Pain and light touch intact in extremities.  Symmetrical.  Speech is fluent. Motor exam as listed above. Psychiatric: Judgment intact, Mood & affect appropriate for pt's clinical situation. Dermatologic: No Venous rashes. No Ulcers Noted.  No changes consistent with cellulitis. Lymph : No Cervical lymphadenopathy, no lichenification or skin changes of chronic lymphedema.       ASSESSMENT AND PLAN:  1. Acute deep vein thrombosis (DVT) of femoral vein of right lower extremity (HCC) Recommend:   No surgery or intervention at this point in time.  IVC filter is not indicated at present.  Patient's duplex ultrasound of the venous system shows DVT from the popliteal to the femoral veins.  The patient is continued on anticoagulation   Elevation was stressed, use of a recliner was discussed.  The patient was placed in [place] wraps on his right lower extremity due to extensive swelling.  Patient will present to the office on a weekly basis to change his wraps.  We will reevaluate in 4 weeks to evaluate lower extremity swelling.  We will also repeat ultrasound in 6 to 8 weeks.  The patient will continue anticoagulation for now as there have not been any problems or  complications at this point.    2. Tobacco abuse Smoking cessation was discussed, 3-10 minutes spent on this topic specifically   3. COPD with acute exacerbation ([place]) Continue pulmonary medications and aerosols as already ordered, these medications have been reviewed and there are no changes at this time.     Current Outpatient Medications on File Prior to Visit  Medication Sig Dispense Refill  . albuterol (VENTOLIN HFA) 108 (90 Base) MCG/ACT inhaler Inhale 2 puffs into the lungs every 6 (six) hours as needed for wheezing or shortness of breath. 18 g 0  . nicotine (NICODERM CQ - DOSED IN MG/24 HOURS) 14 mg/24hr patch One 14mg  patch chest wall daily (okay to substitute generic) (Patient not taking: Reported on 11/23/2019) 28 patch 0  . Rivaroxaban 15 & 20 MG TBPK Follow package directions: Take one 15mg  tablet by mouth twice a day. On day 22, switch to one 20mg  tablet once a day. Take with food. (Patient not taking: Reported on 11/23/2019) 51 each 0   No current facility-administered medications on file prior to visit.    There are no Patient Instructions on file for this visit. No follow-ups on file.   Kris Hartmann, NP  This note was completed with Sales executive.  Any errors are purely unintentional.

## 2019-11-30 ENCOUNTER — Other Ambulatory Visit: Payer: Self-pay

## 2019-11-30 ENCOUNTER — Encounter (INDEPENDENT_AMBULATORY_CARE_PROVIDER_SITE_OTHER): Payer: Self-pay

## 2019-11-30 ENCOUNTER — Ambulatory Visit (INDEPENDENT_AMBULATORY_CARE_PROVIDER_SITE_OTHER): Payer: Self-pay | Admitting: Nurse Practitioner

## 2019-11-30 VITALS — BP 130/80 | HR 87 | Ht 67.0 in | Wt 281.0 lb

## 2019-11-30 DIAGNOSIS — I824Y1 Acute embolism and thrombosis of unspecified deep veins of right proximal lower extremity: Secondary | ICD-10-CM

## 2019-11-30 NOTE — Progress Notes (Signed)
History of Present Illness  There is no documented history at this time  Assessments & Plan   There are no diagnoses linked to this encounter.    Additional instructions  Subjective:  Patient presents with venous ulcer of the Left lower extremity.    Procedure:  3 layer unna wrap was placed Left lower extremity.   Plan:   Follow up in one week.  

## 2019-12-07 ENCOUNTER — Ambulatory Visit (INDEPENDENT_AMBULATORY_CARE_PROVIDER_SITE_OTHER): Payer: Self-pay | Admitting: Vascular Surgery

## 2019-12-07 ENCOUNTER — Other Ambulatory Visit: Payer: Self-pay

## 2019-12-07 ENCOUNTER — Encounter (INDEPENDENT_AMBULATORY_CARE_PROVIDER_SITE_OTHER): Payer: Self-pay

## 2019-12-07 DIAGNOSIS — I83009 Varicose veins of unspecified lower extremity with ulcer of unspecified site: Secondary | ICD-10-CM

## 2019-12-07 DIAGNOSIS — L97909 Non-pressure chronic ulcer of unspecified part of unspecified lower leg with unspecified severity: Secondary | ICD-10-CM

## 2019-12-07 NOTE — Progress Notes (Signed)
History of Present Illness  There is no documented history at this time  Assessments & Plan   There are no diagnoses linked to this encounter.    Additional instructions  Subjective:  Patient presents with venous ulcer of the Bilateral lower extremity.    Procedure:  3 layer unna wrap was placed Bilateral lower extremity.   Plan:   Follow up in one week.  

## 2019-12-08 ENCOUNTER — Ambulatory Visit (INDEPENDENT_AMBULATORY_CARE_PROVIDER_SITE_OTHER): Payer: Self-pay | Admitting: Nurse Practitioner

## 2019-12-08 ENCOUNTER — Encounter (INDEPENDENT_AMBULATORY_CARE_PROVIDER_SITE_OTHER): Payer: Self-pay

## 2019-12-08 VITALS — BP 132/83 | HR 87 | Resp 16 | Ht 63.0 in

## 2019-12-08 DIAGNOSIS — I824Y1 Acute embolism and thrombosis of unspecified deep veins of right proximal lower extremity: Secondary | ICD-10-CM

## 2019-12-08 NOTE — Progress Notes (Signed)
History of Present Illness  There is no documented history at this time  Assessments & Plan   There are no diagnoses linked to this encounter.    Additional instructions  Subjective:  Patient presents with venous ulcer of the Bilateral lower extremity.    Procedure:  3 layer unna wrap was placed Bilateral lower extremity.   Plan:   Follow up in one week.  

## 2019-12-09 ENCOUNTER — Encounter (INDEPENDENT_AMBULATORY_CARE_PROVIDER_SITE_OTHER): Payer: Self-pay | Admitting: Vascular Surgery

## 2019-12-09 DIAGNOSIS — L97909 Non-pressure chronic ulcer of unspecified part of unspecified lower leg with unspecified severity: Secondary | ICD-10-CM | POA: Insufficient documentation

## 2019-12-09 DIAGNOSIS — I83009 Varicose veins of unspecified lower extremity with ulcer of unspecified site: Secondary | ICD-10-CM | POA: Insufficient documentation

## 2019-12-10 ENCOUNTER — Telehealth: Payer: Self-pay | Admitting: Pharmacy Technician

## 2019-12-10 NOTE — Telephone Encounter (Signed)
Received updated proof of income.  Patient eligible to receive medication assistance at [facility] until time for re-certification in 9359, and as long as eligibility requirements continue to be met.  [facility]

## 2019-12-11 ENCOUNTER — Other Ambulatory Visit: Payer: Self-pay

## 2019-12-11 ENCOUNTER — Emergency Department
Admission: EM | Admit: 2019-12-11 | Discharge: 2019-12-11 | Disposition: A | Discharge status: Home / Self Care | Payer: Self-pay | Attending: Emergency Medicine | Admitting: Emergency Medicine

## 2019-12-11 DIAGNOSIS — F17210 Nicotine dependence, cigarettes, uncomplicated: Secondary | ICD-10-CM | POA: Insufficient documentation

## 2019-12-11 DIAGNOSIS — X58XXXA Exposure to other specified factors, initial encounter: Secondary | ICD-10-CM | POA: Insufficient documentation

## 2019-12-11 DIAGNOSIS — Y999 Unspecified external cause status: Secondary | ICD-10-CM | POA: Insufficient documentation

## 2019-12-11 DIAGNOSIS — Y939 Activity, unspecified: Secondary | ICD-10-CM | POA: Insufficient documentation

## 2019-12-11 DIAGNOSIS — Y929 Unspecified place or not applicable: Secondary | ICD-10-CM | POA: Insufficient documentation

## 2019-12-11 DIAGNOSIS — J449 Chronic obstructive pulmonary disease, unspecified: Secondary | ICD-10-CM | POA: Insufficient documentation

## 2019-12-11 DIAGNOSIS — T24202A Burn of second degree of unspecified site of left lower limb, except ankle and foot, initial encounter: Secondary | ICD-10-CM | POA: Insufficient documentation

## 2019-12-11 DIAGNOSIS — X16XXXA Contact with hot heating appliances, radiators and pipes, initial encounter: Secondary | ICD-10-CM | POA: Insufficient documentation

## 2019-12-11 MED ORDER — OXYCODONE-ACETAMINOPHEN 5-325 MG PO TABS
1.0000 | ORAL_TABLET | Freq: Once | ORAL | Status: AC
  Administered 2019-12-11: 1 via ORAL
  Filled 2019-12-11: qty 1

## 2019-12-11 MED ORDER — SILVER SULFADIAZINE 1 % EX CREA: TOPICAL_CREAM | Freq: Once | CUTANEOUS | Status: AC

## 2019-12-11 MED ORDER — SILVER SULFADIAZINE 1 % EX CREA: TOPICAL_CREAM | CUTANEOUS | 1 refills | Status: DC

## 2019-12-11 MED ORDER — TETANUS-DIPHTH-ACELL PERTUSSIS 5-2.5-18.5 LF-MCG/0.5 IM SUSP
0.5000 mL | Freq: Once | INTRAMUSCULAR | Status: AC
  Administered 2019-12-11: 0.5 mL via INTRAMUSCULAR
  Filled 2019-12-11: qty 0.5
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
## 2019-12-11 NOTE — Discharge Instructions (Signed)
Please apply silvadene to help with pain and to prevent infection. Please keep wound covered. Please call primary care or wound center on Monday for a follow up appointment.
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
## 2019-12-11 NOTE — ED Notes (Signed)
Pt reports 8/10 pain, provider msg'd; pt reports wife coming to pick him up

## 2019-12-11 NOTE — ED Notes (Signed)
No peripheral IV placed this visit.   Discharge instructions reviewed with patient. Questions fielded by this RN. Patient verbalizes understanding of instructions. Patient discharged home in stable condition per [place], [place]. No acute distress noted at time of discharge.

## 2019-12-14 ENCOUNTER — Encounter (INDEPENDENT_AMBULATORY_CARE_PROVIDER_SITE_OTHER): Payer: Self-pay

## 2019-12-15 ENCOUNTER — Ambulatory Visit (INDEPENDENT_AMBULATORY_CARE_PROVIDER_SITE_OTHER): Payer: Self-pay | Admitting: Nurse Practitioner

## 2019-12-15 ENCOUNTER — Encounter (INDEPENDENT_AMBULATORY_CARE_PROVIDER_SITE_OTHER): Payer: Self-pay

## 2019-12-15 ENCOUNTER — Other Ambulatory Visit: Payer: Self-pay

## 2019-12-15 VITALS — BP 126/79 | HR 73 | Resp 16 | Wt 285.0 lb

## 2019-12-15 DIAGNOSIS — I824Y1 Acute embolism and thrombosis of unspecified deep veins of right proximal lower extremity: Secondary | ICD-10-CM

## 2019-12-15 NOTE — Progress Notes (Signed)
History of Present Illness  There is no documented history at this time  Assessments & Plan   There are no diagnoses linked to this encounter.    Additional instructions  Subjective:  Patient presents with venous ulcer of the Right lower extremity.    Procedure:  3 layer unna wrap was placed Right lower extremity.   Plan:   Follow up in one week.   

## 2019-12-17 ENCOUNTER — Ambulatory Visit (INDEPENDENT_AMBULATORY_CARE_PROVIDER_SITE_OTHER): Payer: Self-pay | Admitting: Gastroenterology

## 2019-12-17 ENCOUNTER — Other Ambulatory Visit: Payer: Self-pay

## 2019-12-17 VITALS — BP 116/78 | HR 85 | Temp 98.2°F | Ht 67.0 in | Wt 289.0 lb

## 2019-12-17 DIAGNOSIS — R932 Abnormal findings on diagnostic imaging of liver and biliary tract: Secondary | ICD-10-CM

## 2019-12-17 DIAGNOSIS — Z1211 Encounter for screening for malignant neoplasm of colon: Secondary | ICD-10-CM

## 2019-12-17 NOTE — Progress Notes (Signed)
Wyline Mood MD, MRCP(U.K) [address]  [address]  [place], [place] [address]  Main: [phone]  Fax: [phone]   Gastroenterology Consultation  Referring Provider:     Rolm Gala, NP Primary Care Physician:  Rolm Gala, NP Primary Gastroenterologist:  Dr. Wyline Mood  Reason for Consultation:    Possible liver cirrhosis        HPI:   Matthew [name] is a 49 y.o. y/o male referred for consultation & management  by  Rolm Gala, NP.     He was admitted and monitored 2021 with a DVT and during that time had imaging in the form of a CT angiogram which demonstrated hepatic steatosis and the surface of the liver appearing mildly nodular..  Plan at the time of discharge was to follow-up as an outpatient.  Review of labs indicate he had an albumin of 2.7 during the hospitalization alkaline phosphatase of 156 and normal transaminases.  Antimitochondrial antibody was negative.  Smooth muscle antibody was not checked.  Hepatitis B core total antibody hepatitis C antibody hepatitis B surface antibody were negative.  PT/INR was 1.2.  A month prior his total albumin of 3.5.  He denies any family history of liver disease.  Denies any excess alcohol consumption but he drinks a 12 pack of beer per once a week for many years.  Denies any illegal drug use.  Did not serve in the [employer].  Has 1  tattoo. Past Medical History:  Diagnosis Date  . Arthritis   . Hx of blood clots   . Sleep apnea    patient states not anymore    Past Surgical History:  Procedure Laterality Date  . IVC FILTER INSERTION Left 11/24/2018   Procedure: IVC FILTER INSERTION;  Surgeon: Annice Needy, MD;  Location: [facility];  Service: Cardiovascular;  Laterality: Left;  . IVC FILTER INSERTION Right 02/18/2019   Procedure: IVC FILTER INSERTION WITH RIGHT LOWER EXTREMITY VENOUS LYSIS;  Surgeon: Renford Dills, MD;  Location: [facility];  Service:  Cardiovascular;  Laterality: Right;  . IVC FILTER REMOVAL N/A 12/01/2018   Procedure: IVC FILTER REMOVAL;  Surgeon: Annice Needy, MD;  Location: [facility];  Service: Cardiovascular;  Laterality: N/A;  . NO PAST SURGERIES    . PERIPHERAL VASCULAR THROMBECTOMY Right 10/30/2019   Procedure: PERIPHERAL VASCULAR THROMBECTOMY / THROMBOLYSIS;  Surgeon: Annice Needy, MD;  Location: [facility];  Service: Cardiovascular;  Laterality: Right;  . PERIPHERAL VASCULAR THROMBECTOMY Right 11/05/2019   Procedure: PERIPHERAL VASCULAR THROMBECTOMY / THROMBOLYSIS;  Surgeon: Annice Needy, MD;  Location: [facility];  Service: Cardiovascular;  Laterality: Right;    Prior to Admission medications   Medication Sig Start Date End Date Taking? Authorizing Provider  albuterol (VENTOLIN HFA) 108 (90 Base) MCG/ACT inhaler Inhale 2 puffs into the lungs every 6 (six) hours as needed for wheezing or shortness of breath. 11/06/19   Alford [place], MD  nicotine (NICODERM CQ - DOSED IN MG/24 HOURS) 14 mg/24hr patch One 14mg  patch chest wall daily (okay to substitute generic) Patient not taking: Reported on 11/23/2019 11/06/19   01/06/20, MD  Rivaroxaban 15 & 20 MG TBPK Follow package directions: Take one 15mg  tablet by mouth twice a day. On day 22, switch to one 20mg  tablet once a day. Take with food. Patient not taking: Reported on 11/23/2019 11/06/19   , MD  silver sulfADIAZINE (SILVADENE) 1 %  cream Apply to affected area daily 12/11/19 12/10/20  Laban Emperor, PA-C    No family history on file.   Social History   Tobacco Use  . Smoking status: Current Every Day Smoker    Packs/day: 0.50    Types: Cigarettes  . Smokeless tobacco: Current User  Substance Use Topics  . Alcohol use: Yes    Comment: weekends says every third weekend a 12 pack  . Drug use: Never    Allergies as of 12/17/2019 - Review Complete 12/15/2019  Allergen Reaction Noted  . Amoxicillin Rash 12/11/2019     Review of Systems:    All systems reviewed and negative except where noted in HPI.   Physical Exam:  There were no vitals taken for this visit. No LMP for male patient. Psych:  Alert and cooperative. Normal mood and affect. General:   Alert,  Well-developed, well-nourished, pleasant and cooperative in NAD Head:  Normocephalic and atraumatic. Eyes:  Sclera clear, no icterus.   Conjunctiva pink. Ears:  Normal auditory acuity. Lungs:  Respirations even and unlabored.  Clear throughout to auscultation.   No wheezes, crackles, or rhonchi. No acute distress. Heart:  Regular rate and rhythm; no murmurs, clicks, rubs, or gallops. Abdomen:  Normal bowel sounds.  No bruits.  Soft, non-tender and non-distended without masses, hepatosplenomegaly or hernias noted.  No guarding or rebound tenderness.    Neurologic:  Alert and oriented x3;  grossly normal neurologically. Psych:  Alert and cooperative. Normal mood and affect.  Imaging Studies: VAS [place] LOWER EXTREMITY VENOUS (DVT)  Result Date: 11/24/2019  Lower Venous DVTStudy Risk Factors: DVT Right lower extremity. Comparison Study: 11/02/2019 Performing Technologist: Charlane Ferretti RT (R)(VS)  Examination Guidelines: A complete evaluation includes B-mode imaging, spectral Doppler, color Doppler, and power Doppler as needed of all accessible portions of each vessel. Bilateral testing is considered an integral part of a complete examination. Limited examinations for reoccurring indications may be performed as noted. The reflux portion of the exam is performed with the patient in reverse Trendelenburg.  +---------+---------------+---------+-----------+----------+-------------------+ RIGHT    CompressibilityPhasicitySpontaneityPropertiesThrombus Aging      +---------+---------------+---------+-----------+----------+-------------------+ CFV      None                                                              +---------+---------------+---------+-----------+----------+-------------------+ SFJ      None                                                             +---------+---------------+---------+-----------+----------+-------------------+ FV Prox  None                                                             +---------+---------------+---------+-----------+----------+-------------------+ FV Mid   None                                                             +---------+---------------+---------+-----------+----------+-------------------+  FV DistalPartial                                                          +---------+---------------+---------+-----------+----------+-------------------+ PFV      Full                                                             +---------+---------------+---------+-----------+----------+-------------------+ POP      Partial                                                          +---------+---------------+---------+-----------+----------+-------------------+ PTV                                                   Not visualized well +---------+---------------+---------+-----------+----------+-------------------+ GSV      None                                                             +---------+---------------+---------+-----------+----------+-------------------+ SSV      Full                                                             +---------+---------------+---------+-----------+----------+-------------------+  Summary: RIGHT: - Findings appear essentially unchanged compared to previous examination. - Occlusive thrombus is visualized in the CFV, SFJ, and proximal SFV. Partial compression in the mid SFV to popliteal vein. Posterior tibial vein not visualized well due to leg swelling. The right GSV appears non compressable from the SFJ to distal and appears to have partial compression at the level of the knee.   *See table(s) above for measurements and observations. Electronically signed by Festus Barren MD on 11/24/2019 at 3:07:36 PM.    Final     Assessment and Plan:   Matthew [name] is a 49 y.o. y/o male has been referred for possible cirrhosis of the liver incidentally found on imaging when he was admitted to the hospital in March 2021 with a DVT.  Looking back at his labs 6 months back he is albumin was almost normal with a normal platelet count.  But in February 2021 his platelet count of 128 borderline low.  INR has been normal.  I will proceed with evaluation with an elastography to determine his level of fibrosis and obtain a full liver work-up.   Plan 1.  Full autoimmune and viral hepatitis work-up 2.  Liver elastography. 3.  Limit the use of alcohol 4.  Patient information will be provided for NAFLD 5.  If the patient has features suggestive of cirrhosis on the elastography will then need surveillance ultrasound of his liver and upper endoscopy to screen for esophageal varices. 6.  Never had a colonoscopy average risk for colon cancer agreeable to screening  I have discussed alternative options, risks & benefits,  which include, but are not limited to, bleeding, infection, perforation,respiratory complication & drug reaction.  The patient agrees with this plan & written consent will be obtained.     Follow up in 6 to 8 weeks  Dr Wyline Mood MD,MRCP(U.K)

## 2019-12-17 NOTE — Patient Instructions (Addendum)
Nonalcoholic Fatty Liver Disease Diet, Adult Nonalcoholic fatty liver disease is a condition that causes fat to build up in and around the liver. The disease makes it harder for the liver to work the way that it should. Following a healthy diet can help to keep nonalcoholic fatty liver disease under control. It can also help to prevent or improve conditions that are associated with the disease, such as heart disease, diabetes, high blood pressure, and abnormal cholesterol levels. Along with regular exercise, this diet:  Promotes weight loss.  Helps to control blood sugar levels.  Helps to improve the way that the body uses insulin. What are tips for following this plan? Reading food labels Always check food labels for:  The amount of saturated fat in a food. You should limit your intake of saturated fat. Saturated fat is found in foods that come from animals, including meat and dairy products such as butter, cheese, and whole milk.  The amount of fiber in a food. You should choose high-fiber foods such as fruits, vegetables, and whole grains. Try to get 25-30 grams (g) of fiber a day.  Cooking  When cooking, use heart-healthy oils that are high in monounsaturated fats. These include olive oil, canola oil, and avocado oil.  Limit frying or deep-frying foods. Cook foods using healthy methods such as baking, boiling, steaming, and grilling instead. Meal planning  You may want to keep track of how many calories you take in. Eating the right amount of calories will help you achieve a healthy weight. Meeting with a registered dietitian can help you get started.  Limit how often you eat takeout and fast food. These foods are usually very high in fat, salt, and sugar.  Use the glycemic index (GI) to plan your meals. The index tells you how quickly a food will raise your blood sugar. Choose low-GI foods (GI less than 55). These foods take a longer time to raise blood sugar. A registered dietitian  can help you identify foods lower on the GI scale. Lifestyle  You may want to follow a Mediterranean diet. This diet includes a lot of vegetables, lean meats or fish, whole grains, fruits, and healthy oils and fats. What foods can I eat?  Fruits Bananas. Apples. Oranges. Grapes. Papaya. Mango. Pomegranate. Kiwi. Grapefruit. Cherries. Vegetables Lettuce. Spinach. Peas. Beets. Cauliflower. Cabbage. Broccoli. Carrots. Tomatoes. Squash. Eggplant. Herbs. Peppers. Onions. Cucumbers. Brussels sprouts. Yams and sweet potatoes. Beans. Lentils. Grains Whole wheat or whole-grain foods, including breads, crackers, cereals, and pasta. Stone-ground whole wheat. Unsweetened oatmeal. Bulgur. Barley. Quinoa. Brown or wild rice. Corn or whole wheat flour tortillas. Meats and other proteins Lean meats. Poultry. Tofu. Seafood and shellfish. Dairy Low-fat or fat-free dairy products, such as yogurt, cottage cheese, or cheese. Beverages Water. Sugar-free drinks. Tea. Coffee. Low-fat or skim milk. Milk alternatives, such as soy or almond milk. Real fruit juice. Fats and oils Avocado. Canola or olive oil. Nuts and nut butters. Seeds. Seasonings and condiments Mustard. Relish. Low-fat, low-sugar ketchup and barbecue sauce. Low-fat or fat-free mayonnaise. Sweets and desserts Sugar-free sweets. The items listed above may not be a complete list of foods and beverages you can eat. Contact a dietitian for more information. What foods should I limit or avoid? Meats and other proteins Limit red meat to 1-2 times a week. Dairy Full-fat dairy. Fats and oils Palm oil and coconut oil. Fried foods. Other foods Processed foods. Foods that contain a lot of salt or sodium. Sweets and desserts Sweets that contain   sugar. Beverages Sweetened drinks, such as sweet tea, milkshakes, iced sweet drinks, and sodas. Alcohol. The items listed above may not be a complete list of foods and beverages you should avoid. Contact a  dietitian for more information. Where to find more information The National Institute of Diabetes and Digestive and Kidney Diseases: niddk.nih.gov Summary  Nonalcoholic fatty liver disease is a condition that causes fat to build up in and around the liver.  Following a healthy diet can help to keep nonalcoholic fatty liver disease under control. Your diet should be rich in fruits, vegetables, whole grains, and lean proteins.  Limit your intake of saturated fat. Saturated fat is found in foods that come from animals, including meat and dairy products such as butter, cheese, and whole milk.  This diet promotes weight loss, helps to control blood sugar levels, and helps to improve the way that the body uses insulin. This information is not intended to replace advice given to you by your health care provider. Make sure you discuss any questions you have with your health care provider. Document Revised: 12/12/2018 Document Reviewed: 09/11/2018 Elsevier Patient Education  2020 [employer]  

## 2019-12-19 ENCOUNTER — Other Ambulatory Visit: Payer: Self-pay

## 2019-12-19 ENCOUNTER — Emergency Department: Payer: Self-pay

## 2019-12-19 ENCOUNTER — Observation Stay
Admission: EM | Admit: 2019-12-19 | Discharge: 2019-12-20 | Discharge status: Against Medical Advice | Payer: Self-pay | Attending: Internal Medicine | Admitting: Internal Medicine

## 2019-12-19 DIAGNOSIS — Z7901 Long term (current) use of anticoagulants: Secondary | ICD-10-CM | POA: Insufficient documentation

## 2019-12-19 DIAGNOSIS — Z86718 Personal history of other venous thrombosis and embolism: Secondary | ICD-10-CM | POA: Insufficient documentation

## 2019-12-19 DIAGNOSIS — Z8616 Personal history of COVID-19: Secondary | ICD-10-CM | POA: Insufficient documentation

## 2019-12-19 DIAGNOSIS — J9600 Acute respiratory failure, unspecified whether with hypoxia or hypercapnia: Secondary | ICD-10-CM | POA: Diagnosis present

## 2019-12-19 DIAGNOSIS — R7303 Prediabetes: Secondary | ICD-10-CM | POA: Insufficient documentation

## 2019-12-19 DIAGNOSIS — J449 Chronic obstructive pulmonary disease, unspecified: Secondary | ICD-10-CM | POA: Insufficient documentation

## 2019-12-19 DIAGNOSIS — I480 Paroxysmal atrial fibrillation: Secondary | ICD-10-CM | POA: Insufficient documentation

## 2019-12-19 DIAGNOSIS — I824Y1 Acute embolism and thrombosis of unspecified deep veins of right proximal lower extremity: Secondary | ICD-10-CM

## 2019-12-19 DIAGNOSIS — Z880 Allergy status to penicillin: Secondary | ICD-10-CM | POA: Insufficient documentation

## 2019-12-19 DIAGNOSIS — Z95818 Presence of other cardiac implants and grafts: Secondary | ICD-10-CM | POA: Insufficient documentation

## 2019-12-19 DIAGNOSIS — Z9981 Dependence on supplemental oxygen: Secondary | ICD-10-CM | POA: Insufficient documentation

## 2019-12-19 DIAGNOSIS — E669 Obesity, unspecified: Secondary | ICD-10-CM | POA: Insufficient documentation

## 2019-12-19 DIAGNOSIS — Z720 Tobacco use: Secondary | ICD-10-CM

## 2019-12-19 DIAGNOSIS — J189 Pneumonia, unspecified organism: Secondary | ICD-10-CM | POA: Insufficient documentation

## 2019-12-19 DIAGNOSIS — I82409 Acute embolism and thrombosis of unspecified deep veins of unspecified lower extremity: Secondary | ICD-10-CM | POA: Diagnosis present

## 2019-12-19 DIAGNOSIS — A419 Sepsis, unspecified organism: Principal | ICD-10-CM | POA: Insufficient documentation

## 2019-12-19 DIAGNOSIS — J9621 Acute and chronic respiratory failure with hypoxia: Secondary | ICD-10-CM | POA: Diagnosis present

## 2019-12-19 DIAGNOSIS — J9601 Acute respiratory failure with hypoxia: Secondary | ICD-10-CM | POA: Insufficient documentation

## 2019-12-19 DIAGNOSIS — G4733 Obstructive sleep apnea (adult) (pediatric): Secondary | ICD-10-CM

## 2019-12-19 DIAGNOSIS — F17210 Nicotine dependence, cigarettes, uncomplicated: Secondary | ICD-10-CM | POA: Insufficient documentation

## 2019-12-19 DIAGNOSIS — Z20822 Contact with and (suspected) exposure to covid-19: Secondary | ICD-10-CM | POA: Insufficient documentation

## 2019-12-19 LAB — COMPREHENSIVE METABOLIC PANEL
ALT: 16 U/L (ref 0–44)
AST: 19 U/L (ref 15–41)
Albumin: 3.4 g/dL — ABNORMAL LOW (ref 3.5–5.0)
Alkaline Phosphatase: 149 U/L — ABNORMAL HIGH (ref 38–126)
Anion gap: 10 (ref 5–15)
BUN: 7 mg/dL (ref 6–20)
CO2: 28 mmol/L (ref 22–32)
Calcium: 8.9 mg/dL (ref 8.9–10.3)
Chloride: 99 mmol/L (ref 98–111)
Creatinine, Ser: 0.84 mg/dL (ref 0.61–1.24)
GFR calc Af Amer: >60 mL/min (ref >60)
GFR calc non Af Amer: >60 mL/min (ref >60)
Glucose, Bld: 107 mg/dL — ABNORMAL HIGH (ref 70–99)
Potassium: 4.7 mmol/L (ref 3.5–5.1)
Sodium: 137 mmol/L (ref 135–145)
Total Bilirubin: 0.7 mg/dL (ref 0.3–1.2)
Total Protein: 8 g/dL (ref 6.5–8.1)

## 2019-12-19 LAB — CBC WITH DIFFERENTIAL/PLATELET
Abs Immature Granulocytes: 0.02 10*3/uL (ref 0.00–0.07)
Basophils Absolute: 0.1 10*3/uL (ref 0.0–0.1)
Basophils Relative: 1 %
Eosinophils Absolute: 0.1 10*3/uL (ref 0.0–0.5)
Eosinophils Relative: 2 %
HCT: 52.3 % — ABNORMAL HIGH (ref 39.0–52.0)
Hemoglobin: 17.1 g/dL — ABNORMAL HIGH (ref 13.0–17.0)
Immature Granulocytes: 0 %
Lymphocytes Relative: 27 %
Lymphs Abs: 2 10*3/uL (ref 0.7–4.0)
MCH: 33.3 pg (ref 26.0–34.0)
MCHC: 32.7 g/dL (ref 30.0–36.0)
MCV: 101.9 fL — ABNORMAL HIGH (ref 80.0–100.0)
Monocytes Absolute: 0.7 10*3/uL (ref 0.1–1.0)
Monocytes Relative: 9 %
Neutro Abs: 4.6 10*3/uL (ref 1.7–7.7)
Neutrophils Relative %: 61 %
Platelets: 164 10*3/uL (ref 150–400)
RBC: 5.13 MIL/uL (ref 4.22–5.81)
RDW: 15.3 % (ref 11.5–15.5)
WBC: 7.5 10*3/uL (ref 4.0–10.5)
nRBC: 0.4 % — ABNORMAL HIGH (ref 0.0–0.2)

## 2019-12-19 LAB — PROTIME-INR
INR: 1.1 (ref 0.8–1.2)
Prothrombin Time: 13.9 seconds (ref 11.4–15.2)

## 2019-12-19 LAB — POC SARS CORONAVIRUS 2 AG: SARS Coronavirus 2 Ag: NEGATIVE

## 2019-12-19 LAB — LACTIC ACID, PLASMA: Lactic Acid, Venous: 1.4 mmol/L (ref 0.5–1.9)

## 2019-12-19 MED ORDER — ACETAMINOPHEN 325 MG PO TABS
650.0000 mg | ORAL_TABLET | Freq: Once | ORAL | Status: AC | PRN
  Administered 2019-12-19: 650 mg via ORAL

## 2019-12-19 MED ORDER — SODIUM CHLORIDE 0.9 % IV BOLUS
1000.0000 mL | Freq: Once | INTRAVENOUS | Status: AC
  Administered 2019-12-20: 1000 mL via INTRAVENOUS

## 2019-12-19 MED ORDER — SODIUM CHLORIDE 0.9 % IV SOLN
500.0000 mg | INTRAVENOUS | Status: DC
  Administered 2019-12-19: 23:00:00 500 mg via INTRAVENOUS
  Filled 2019-12-19 (×2): qty 500

## 2019-12-19 MED ORDER — ACETAMINOPHEN 325 MG PO TABS
ORAL_TABLET | ORAL | Status: AC
  Filled 2019-12-19: qty 2

## 2019-12-19 MED ORDER — SODIUM CHLORIDE 0.9 % IV SOLN
2.0000 g | INTRAVENOUS | Status: DC
  Administered 2019-12-19: 2 g via INTRAVENOUS
  Filled 2019-12-19 (×2): qty 20

## 2019-12-19 NOTE — ED Notes (Addendum)
POC covid test in process.

## 2019-12-19 NOTE — Progress Notes (Signed)
CODE SEPSIS - PHARMACY COMMUNICATION  **Broad Spectrum Antibiotics should be administered within 1 hour of Sepsis diagnosis**  Time Code Sepsis Called/Page Received: 2159  Antibiotics Ordered: Rocephin and Zithromax  Time of 1st antibiotic administration: 2211  Additional action taken by pharmacy: n/a  If necessary, Name of Provider/Nurse Contacted: n/a    Wayland Denis ,PharmD Clinical Pharmacist  12/19/2019  9:59 PM

## 2019-12-19 NOTE — ED Triage Notes (Signed)
Patient c/o fever at home beginning this am and general malaise. Patient's wife dx with COVID. Patient reports fever of 102.7 at home.

## 2019-12-19 NOTE — ED Notes (Addendum)
Mild audible wheezes noted bilaterally, Pt c/o SOB and cough. Pt denies any pain. Pt dyspneic at rest. 2 liter's [place] applied.

## 2019-12-19 NOTE — ED Notes (Signed)
Patient reports he is supposed to wear 2L oxygen via [place] at home. Patient placed on 2L [place]. Patient's oxygen saturation increased to 98% on 2L.

## 2019-12-19 NOTE — ED Provider Notes (Signed)
[facility] Emergency Department Provider Note  ____________________________________________  Time seen: Approximately 11:09 PM  I have reviewed the triage vital signs and the nursing notes.   HISTORY  Chief Complaint Fever  Level 5 Caveat: Portions of the History and Physical including HPI and review of systems are unable to be completely obtained due to patient being a poor historian    HPI Matthew [name] is a 49 y.o. male with a history of blood clots COPD atrial fibrillation and obesity, on chronic Eliquis, who comes ED complaining of shortness of breath , cough, weakness, fever.  Reports he does not have any known lung disease, does not use oxygen at home.  Reports that his wife had Covid in December 2020 and he had Covid in January 2021, and both fully recovered from since then.  Reports compliance with his medications including his anticoagulation.  Denies any recent trauma, no vomiting diarrhea chest pain or abdominal pain.  Shortness of breath is constant, no aggravating or alleviating factors.  Has not taken anything for the fever at home.  Notably, the history he provides me is inconsistent with information he provided to triage and nursing.   Past Medical History:  Diagnosis Date  . Arthritis   . Hx of blood clots   . Sleep apnea    patient states not anymore     Patient Active Problem List   Diagnosis Date Noted  . Venous ulcer (HCC) 12/09/2019  . COPD with acute exacerbation (HCC)   . Class 3 severe obesity without serious comorbidity with body mass index (BMI) of 40.0 to 44.9 in adult [facility])   . Other cirrhosis of liver (HCC)   . Hypomagnesemia   . Tobacco abuse 10/29/2019  . Prediabetes 04/25/2019  . Wheeze 04/25/2019  . Ingrown toenail of right foot 04/23/2019  . AF (paroxysmal atrial fibrillation) (HCC) 03/19/2019  . DVT (deep venous thrombosis) (HCC) 03/19/2019  . Bilateral pneumonia 03/11/2019  . Acute DVT (deep venous  thrombosis) (HCC) 02/17/2019  . DVT of deep femoral vein, right (HCC) 11/19/2018     Past Surgical History:  Procedure Laterality Date  . IVC FILTER INSERTION Left 11/24/2018   Procedure: IVC FILTER INSERTION;  Surgeon: Annice Needy, MD;  Location: [facility] INVASIVE CV LAB;  Service: Cardiovascular;  Laterality: Left;  . IVC FILTER INSERTION Right 02/18/2019   Procedure: IVC FILTER INSERTION WITH RIGHT LOWER EXTREMITY VENOUS LYSIS;  Surgeon: Renford Dills, MD;  Location: [facility] INVASIVE CV LAB;  Service: Cardiovascular;  Laterality: Right;  . IVC FILTER REMOVAL N/A 12/01/2018   Procedure: IVC FILTER REMOVAL;  Surgeon: Annice Needy, MD;  Location: [facility] INVASIVE CV LAB;  Service: Cardiovascular;  Laterality: N/A;  . NO PAST SURGERIES    . PERIPHERAL VASCULAR THROMBECTOMY Right 10/30/2019   Procedure: PERIPHERAL VASCULAR THROMBECTOMY / THROMBOLYSIS;  Surgeon: Annice Needy, MD;  Location: [facility] INVASIVE CV LAB;  Service: Cardiovascular;  Laterality: Right;  . PERIPHERAL VASCULAR THROMBECTOMY Right 11/05/2019   Procedure: PERIPHERAL VASCULAR THROMBECTOMY / THROMBOLYSIS;  Surgeon: Annice Needy, MD;  Location: [facility] INVASIVE CV LAB;  Service: Cardiovascular;  Laterality: Right;     Prior to Admission medications   Medication Sig Start Date End Date Taking? Authorizing Provider  albuterol (VENTOLIN HFA) 108 (90 Base) MCG/ACT inhaler Inhale 2 puffs into the lungs every 6 (six) hours as needed for wheezing or shortness of breath. Patient not taking: Reported on 12/17/2019 11/06/19   Alford Highland, MD  nicotine (NICODERM CQ -  DOSED IN MG/24 HOURS) 14 mg/24hr patch One 14mg  patch chest wall daily (okay to substitute generic) Patient not taking: Reported on 11/23/2019 11/06/19   01/06/20, MD  Rivaroxaban 15 & 20 MG TBPK Follow package directions: Take one 15mg  tablet by mouth twice a day. On day 22, switch to one 20mg  tablet once a day. Take with food. 11/06/19   , MD  silver sulfADIAZINE  (SILVADENE) 1 % cream Apply to affected area daily Patient not taking: Reported on 12/17/2019 12/11/19 12/10/20  12/19/2019, PA-C     Allergies Amoxicillin   No family history on file.  Social History Social History   Tobacco Use  . Smoking status: Current Every Day Smoker    Packs/day: 0.50    Types: Cigarettes  . Smokeless tobacco: Current User  Substance Use Topics  . Alcohol use: Yes    Comment: weekends says every third weekend a 12 pack  . Drug use: Never    Review of Systems  Constitutional:   Positive fever and chills.  ENT:   No sore throat. No rhinorrhea. Cardiovascular:   No chest pain or syncope. Respiratory: Positive shortness of breath and cough. Gastrointestinal:   Negative for abdominal pain, vomiting and diarrhea.  Musculoskeletal:   Negative for focal pain or swelling All other systems reviewed and are negative except as documented above in ROS and HPI.  ____________________________________________   PHYSICAL EXAM:  VITAL SIGNS: ED Triage Vitals  Enc Vitals Group     BP 12/19/19 1907 124/76     Pulse Rate 12/19/19 1907 (!) 115     Resp 12/19/19 1907 (!) 22     Temp 12/19/19 1907 (!) 102.6 F (39.2 C)     Temp Source 12/19/19 2136 Oral     SpO2 12/19/19 1907 (!) 84 %     Weight 12/19/19 1910 288 lb 12.8 oz (131 kg)     Height 12/19/19 1910 5\' 7"  (1.702 m)     Head Circumference --      Peak Flow --      Pain Score 12/19/19 1909 0     Pain Loc --      Pain Edu? --      Excl. in GC? --     Vital signs reviewed, nursing assessments reviewed.   Constitutional:   Alert and oriented.  Ill-appearing Eyes:   Conjunctivae are normal. EOMI. PERRL. ENT      Head:   Normocephalic and atraumatic.      Nose:   Wearing a mask.      Mouth/Throat:   Wearing a mask.      Neck:   No meningismus. Full ROM. Hematological/Lymphatic/Immunilogical:   No cervical lymphadenopathy. Cardiovascular:   Irregularly irregular rhythm, heart rate 1 10-1 20.  Symmetric bilateral radial and DP pulses.  No murmurs. Cap refill less than 2 seconds. Respiratory: Tachypnea, no accessory muscle use.  Coarse breath sounds diffusely.  No focal crackles. Gastrointestinal:   Soft and nontender. Non distended. There is no CVA tenderness.  No rebound, rigidity, or guarding.  Musculoskeletal:   Normal range of motion in all extremities. No joint effusions.  No lower extremity tenderness.  No edema. Neurologic:   Normal speech and language.  Motor grossly intact. No acute focal neurologic deficits are appreciated.  Skin:    Skin is warm, dry and intact. No rash noted.  No petechiae, purpura, or bullae.  ____________________________________________    LABS (pertinent positives/negatives) (all labs ordered are listed, but  only abnormal results are displayed) Labs Reviewed  COMPREHENSIVE METABOLIC PANEL - Abnormal; Notable for the following components:      Result Value   Glucose, Bld 107 (*)    Albumin 3.4 (*)    Alkaline Phosphatase 149 (*)    All other components within normal limits  CBC WITH DIFFERENTIAL/PLATELET - Abnormal; Notable for the following components:   Hemoglobin 17.1 (*)    HCT 52.3 (*)    MCV 101.9 (*)    nRBC 0.4 (*)    All other components within normal limits  CULTURE, BLOOD (ROUTINE X 2)  CULTURE, BLOOD (ROUTINE X 2)  RESPIRATORY PANEL BY RT PCR (FLU A&B, COVID)  LACTIC ACID, PLASMA  PROTIME-INR  URINALYSIS, COMPLETE (UACMP) WITH MICROSCOPIC  POC SARS CORONAVIRUS 2 AG -  ED  POC SARS CORONAVIRUS 2 AG   ____________________________________________   EKG    ____________________________________________    RADIOLOGY  DG Chest 2 View  Result Date: 12/19/2019 CLINICAL DATA:  Suspected sepsis. Fever. Malaise. Wife diagnosed with COVID. EXAM: CHEST - 2 VIEW COMPARISON:  03/12/2019 FINDINGS: Cardiomegaly, unchanged. Mild interstitial opacities that appears similar to prior exam and are likely chronic. No confluent airspace  disease. No pleural fluid or pneumothorax. No acute osseous abnormalities are seen. IMPRESSION: No acute findings or change from prior exam.  Chronic cardiomegaly. Electronically Signed   By: Narda Rutherford M.D.   On: 12/19/2019 19:55    ____________________________________________   PROCEDURES .Critical Care Performed by: Sharman Cheek, MD Authorized by: Sharman Cheek, MD   Critical care provider statement:    Critical care time (minutes):  35   Critical care time was exclusive of:  Separately billable procedures and treating other patients   Critical care was necessary to treat or prevent imminent or life-threatening deterioration of the following conditions:  Sepsis and respiratory failure   Critical care was time spent personally by me on the following activities:  Development of treatment plan with patient or surrogate, discussions with consultants, evaluation of patient's response to treatment, examination of patient, obtaining history from patient or surrogate, ordering and performing treatments and interventions, ordering and review of laboratory studies, ordering and review of radiographic studies, pulse oximetry, re-evaluation of patient's condition and review of old charts Comments:        .1-3 Lead EKG Interpretation Performed by: Sharman Cheek, MD Authorized by: Sharman Cheek, MD     Interpretation: abnormal     ECG rate:  115   ECG rate assessment: tachycardic     Rhythm: atrial fibrillation     Ectopy: none     Conduction: normal      ____________________________________________  DIFFERENTIAL DIAGNOSIS   COVID-19, pneumonia, pulmonary edema, pleural effusion, sepsis  CLINICAL IMPRESSION / ASSESSMENT AND PLAN / ED COURSE  Medications ordered in the ED: Medications  cefTRIAXone (ROCEPHIN) 2 g in sodium chloride 0.9 % 100 mL IVPB (2 g Intravenous New Bag/Given 12/19/19 2211)  azithromycin (ZITHROMAX) 500 mg in sodium chloride 0.9 % 250 mL IVPB  (has no administration in time range)  acetaminophen (TYLENOL) tablet 650 mg (650 mg Oral Given 12/19/19 1918)    Pertinent labs & imaging results that were available during my care of the patient were reviewed by me and considered in my medical decision making (see chart for details).  Matthew [name] was evaluated in Emergency Department on 12/19/2019 for the symptoms described in the history of present illness. He was evaluated in the context of the global COVID-19 pandemic, which necessitated  consideration that the patient might be at risk for infection with the SARS-CoV-2 virus that causes COVID-19. Institutional protocols and algorithms that pertain to the evaluation of patients at risk for COVID-19 are in a state of rapid change based on information released by regulatory bodies including the CDC and federal and state organizations. These policies and algorithms were followed during the patient's care in the ED.     Clinical Course as of Dec 19 2307  Sat Dec 19, 2019  2157 Patient presents with fever, shortness of breath.  Found to have tachycardia tachypnea and hypoxia.  Being maintained on 2 L nasal cannula for acute respiratory failure.  He appears somewhat confused, possibly delirium.  Code sepsis initiated, empiric antibiotics with ceftriaxone and azithromycin for community-acquired pneumonia.  Will check Covid test.  Patient will need to be admitted due to hypoxia.   [PS]    Clinical Course User Index [PS] Carrie Mew, MD     ____________________________________________   FINAL CLINICAL IMPRESSION(S) / ED DIAGNOSES    Final diagnoses:  Sepsis, due to unspecified organism, unspecified whether acute organ dysfunction present [facility])  Community acquired pneumonia, unspecified laterality     ED Discharge Orders    None      Portions of this note were generated with dragon dictation software. Dictation errors may occur despite best attempts at proofreading.    Carrie Mew, MD 12/19/19 [phone]

## 2019-12-19 NOTE — H&P (Signed)
History and Physical        Hospital Admission Note Date: 12/19/2019  Patient name: Matthew [name] Medical record number: [id number] Date of birth: September 30, 1970 Age: 49 y.o. Gender: male  PCP: Patient, No Pcp Per    Patient coming from: Home   I have reviewed all records in the [facility].    Chief Complaint:  Fever  Level 5 Caveat: Portions of the History and Physical including HPI and review of systems are unable to be completely obtained due to patient being a poor historian   HPI: Matthew [name] is 49 y.o. male with PMH of DVT on Eliquis, COPD, Afib, OSA who presents for fever. Patient also endorses some SOB and cough. Denies sore throat, chest pain, worsening of lower extremity edema, body aches, chills. Denies known lung disease. Reports he has home O2 but doesn't know why it was prescribed. He does not use it due to the electrical current not being strong enough in his apartment. He also has a history of OSA and has a CPAP but doesn't use it anymore because he was tired of it. Reports compliance with anticoagulation.   Denies that he has had COVID. Reports his wife was diagnosed with COVID about two weeks ago and just returned to work. He received his first COVID vaccination two weeks ago. Not sure which one he received but he is scheduled for a second dose.   Notably, the history he provides me is inconsistent with information he provided to triage, nursing, and to ED provider.    ED work-up/course:  Patient presents with fever, shortness of breath.  Found to have tachycardia tachypnea and hypoxia.  Being maintained on 2 L nasal cannula for acute respiratory failure.  He appears somewhat confused, possibly delirium.  Code sepsis initiated, empiric antibiotics with ceftriaxone and azithromycin for community-acquired pneumonia.  Will check Covid test.  Patient will need to be admitted due to hypoxia.   Review of  Systems: Positives marked in 'bold' Constitutional: Denies fever, chills, diaphoresis, poor appetite and fatigue.  HEENT: Denies photophobia, eye pain, redness, hearing loss, ear pain, congestion, sore throat, rhinorrhea, sneezing, mouth sores, trouble swallowing, neck pain, neck stiffness and tinnitus.   Respiratory: Denies SOB, DOE, cough, chest tightness,  and wheezing.   Cardiovascular: Denies chest pain, palpitations and leg swelling.  Gastrointestinal: Denies nausea, vomiting, abdominal pain, diarrhea, constipation, blood in stool and abdominal distention.  Genitourinary: Denies dysuria, urgency, frequency, hematuria, flank pain and difficulty urinating.  Musculoskeletal: Denies myalgias, back pain, joint swelling, arthralgias and gait problem.  Skin: Denies pallor, rash and wound.  Neurological: Denies dizziness, seizures, syncope, weakness, light-headedness, numbness and headaches.  Hematological: Denies adenopathy. Easy bruising, personal or family bleeding history  Psychiatric/Behavioral: Denies suicidal ideation, mood changes, confusion, nervousness, sleep disturbance and agitation  Past Medical History: Past Medical History:  Diagnosis Date  . Arthritis   . Hx of blood clots   . Sleep apnea    patient states not anymore    Past Surgical History:  Procedure Laterality Date  . IVC FILTER INSERTION Left 11/24/2018   Procedure: IVC FILTER INSERTION;  Surgeon: Annice Needy, MD;  Location: [facility] INVASIVE CV  LAB;  Service: Cardiovascular;  Laterality: Left;  . IVC FILTER INSERTION Right 02/18/2019   Procedure: IVC FILTER INSERTION WITH RIGHT LOWER EXTREMITY VENOUS LYSIS;  Surgeon: Renford Dills, MD;  Location: [facility] INVASIVE CV LAB;  Service: Cardiovascular;  Laterality: Right;  . IVC FILTER REMOVAL N/A 12/01/2018   Procedure: IVC FILTER REMOVAL;  Surgeon: Annice Needy, MD;  Location: [facility] INVASIVE CV LAB;  Service: Cardiovascular;  Laterality: N/A;  . NO PAST SURGERIES    .  PERIPHERAL VASCULAR THROMBECTOMY Right 10/30/2019   Procedure: PERIPHERAL VASCULAR THROMBECTOMY / THROMBOLYSIS;  Surgeon: Annice Needy, MD;  Location: [facility] INVASIVE CV LAB;  Service: Cardiovascular;  Laterality: Right;  . PERIPHERAL VASCULAR THROMBECTOMY Right 11/05/2019   Procedure: PERIPHERAL VASCULAR THROMBECTOMY / THROMBOLYSIS;  Surgeon: Annice Needy, MD;  Location: [facility] INVASIVE CV LAB;  Service: Cardiovascular;  Laterality: Right;    Medications: Prior to Admission medications   Medication Sig Start Date End Date Taking? Authorizing Provider  albuterol (VENTOLIN HFA) 108 (90 Base) MCG/ACT inhaler Inhale 2 puffs into the lungs every 6 (six) hours as needed for wheezing or shortness of breath. Patient not taking: Reported on 12/17/2019 11/06/19   Alford Highland, MD  nicotine (NICODERM CQ - DOSED IN MG/24 HOURS) 14 mg/24hr patch One 14mg  patch chest wall daily (okay to substitute generic) Patient not taking: Reported on 11/23/2019 11/06/19   01/06/20, MD  Rivaroxaban 15 & 20 MG TBPK Follow package directions: Take one 15mg  tablet by mouth twice a day. On day 22, switch to one 20mg  tablet once a day. Take with food. 11/06/19   , MD  silver sulfADIAZINE (SILVADENE) 1 % cream Apply to affected area daily Patient not taking: Reported on 12/17/2019 12/11/19 12/10/20  12/19/2019, PA-C    Allergies:   Allergies  Allergen Reactions  . Amoxicillin Rash    Social History:  reports that he has been smoking cigarettes. He has been smoking about 0.50 packs per day. He uses smokeless tobacco. He reports current alcohol use. He reports that he does not use drugs.  Family History: No family history on file.  Physical Exam: Blood pressure 98/83, pulse 97, temperature 100 F (37.8 C), temperature source Oral, resp. rate (!) 24, height 5\' 7"  (1.702 m), weight 131 kg, SpO2 95 %. General: Alert, awake, oriented x3, in no acute distress. Eyes: pink conjunctiva,anicteric sclera, pupils  equal and reactive to light and accomodation, HEENT: normocephalic, atraumatic, oropharynx clear Neck: supple, no masses or lymphadenopathy, no goiter, no bruits, no JVD CVS: Irregularly irregular, without murmurs, rubs or gallops.  Resp : Coarse breath sounds throughout with occasional wheeze and poor air movement. Tachypnea present. Tolono in place.  GI : Soft, nontender, nondistended, positive bowel sounds, no masses. No hepatomegaly. No hernia.  Musculoskeletal: No clubbing or cyanosis, positive pedal pulses. No contracture. ROM intact. Right LE is wrapped (managed by Dr. 02/10/20).  Neuro: Grossly intact, no focal neurological deficits, strength 5/5 upper and lower extremities bilaterally Psych: alert and oriented x 3, normal mood and affect Skin: no rashes or lesions, warm and dry   LABS on Admission: I have personally reviewed all the labs and imagings below    Basic Metabolic Panel: Recent Labs  Lab 12/19/19 1935  NA 137  K 4.7  CL 99  CO2 28  GLUCOSE 107*  BUN 7  CREATININE 0.84  CALCIUM 8.9   Liver Function Tests: Recent Labs  Lab 12/19/19 1935  AST 19  ALT 16  ALKPHOS 149*  BILITOT 0.7  PROT 8.0  ALBUMIN 3.4*   No results for input(s): LIPASE, AMYLASE in the last 168 hours. No results for input(s): AMMONIA in the last 168 hours. CBC: Recent Labs  Lab 12/19/19 1935  WBC 7.5  NEUTROABS 4.6  HGB 17.1*  HCT 52.3*  MCV 101.9*  PLT 164   Cardiac Enzymes: Recent Labs  Lab 12/17/19 1427  CKTOTAL 39*   BNP: Invalid input(s): POCBNP CBG: No results for input(s): GLUCAP in the last 168 hours.  Radiological Exams on Admission:  DG Chest 2 View  Result Date: 12/19/2019 CLINICAL DATA:  Suspected sepsis. Fever. Malaise. Wife diagnosed with COVID. EXAM: CHEST - 2 VIEW COMPARISON:  03/12/2019 FINDINGS: Cardiomegaly, unchanged. Mild interstitial opacities that appears similar to prior exam and are likely chronic. No confluent airspace disease. No pleural fluid or  pneumothorax. No acute osseous abnormalities are seen. IMPRESSION: No acute findings or change from prior exam.  Chronic cardiomegaly. Electronically Signed   By: Keith Rake M.D.   On: 12/19/2019 19:55      EKG: Independently reviewed. Tachycardia. Afib.    Assessment/Plan Active Problems:   AF (paroxysmal atrial fibrillation) (HCC)   DVT (deep venous thrombosis) (HCC)   Tobacco abuse   COPD (chronic obstructive pulmonary disease) (HCC)   Sepsis ([place])   Community acquired pneumonia   Acute respiratory failure (HCC)   OSA (obstructive sleep apnea)  Acute Respiratory Failure/Sepsis/CAP  Patient presenting with T102.6, pulse ox 84% on RA (stable on 2L [place] at admit), tachypnea. Point of care COVID test neg. CXR without acute findings. CBC without leukocytosis. No lactic acidosis. It is possible that patient has acutely decompensated in the setting of chronic respiratory failure; however infectious/viral etiology remains concern due to fever. CXR is clear however bacterial CAP remains possibility and may "fluff out" on imaging with fluids.  -admit to MedSurg, tele with continuous pulse ox  -COVID and influenza pcr pending -continue CTX and Azithromycin for now for presumed CAP, d/c as able  -obtain PCT baseline and trend  -albuterol prn  -give 1L NS bolus -consider repeat CXR during hospitalization -continue O2, wean as tolerated  -given presentation and h/o prior VTE, obtain CTA chest  -blood cultures pending  -repeat CBC in AM   Afib with Prior H/o VTE  Without RPR. Patient reports compliance with Eliquis.  -continue anticoagulation   COPD  Patient only has albuterol listed on medication list. Unknown what his baseline respiratory status is as patient is a very poor historian.  -manage acute resp failure as above   OSA  -CPAP QHS    DVT prophylaxis: Eliquis   CODE STATUS: FULL   Consults called: NONE   Admission status: Observation   The medical decision making  on this patient was of high complexity and the patient is at high risk for clinical deterioration, therefore this is a level 3 admission.  Severity of Illness:     Moderate  The appropriate patient status for this patient is OBSERVATION. Observation status is judged to be reasonable and necessary in order to provide the required intensity of service to ensure the patient's safety. The patient's presenting symptoms, physical exam findings, and initial radiographic and laboratory data in the context of their medical condition is felt to place them at decreased risk for further clinical deterioration. Furthermore, it is anticipated that the patient will be medically stable for discharge from the hospital within 2 midnights of admission. The following factors support the patient status  of observation.   " The patient's presenting symptoms include fever, SOB. " The physical exam findings include coarse breath sounds with wheezes, tachypnea, tachycardia. " The initial radiographic and laboratory data are normal CXR.     Time Spent on Admission: 42 minutes      De Hollingshead D.O.  [facility] 12/19/2019, 11:47 PM

## 2019-12-19 NOTE — ED Triage Notes (Signed)
FIRST NURSE NOTE: Pt arrived via EMS from home with c/o fever x 2 days, fever of 102 with EMS, pt has not taken any meds to reduce fever due to not having meds at home.  Pt also has been non-compliant with using oxygen at home sats with EMS were 88-90% Pt was ambulate without assistance when EMS truck arrived.   BP 113-70  Per EMS pt's wife recently had COVID but has been fully recovered.

## 2019-12-20 ENCOUNTER — Encounter: Payer: Self-pay | Admitting: Internal Medicine

## 2019-12-20 ENCOUNTER — Observation Stay: Payer: Self-pay

## 2019-12-20 DIAGNOSIS — G4733 Obstructive sleep apnea (adult) (pediatric): Secondary | ICD-10-CM

## 2019-12-20 LAB — BASIC METABOLIC PANEL
Anion gap: 6 (ref 5–15)
BUN: 7 mg/dL (ref 6–20)
CO2: 34 mmol/L — ABNORMAL HIGH (ref 22–32)
Calcium: 8.6 mg/dL — ABNORMAL LOW (ref 8.9–10.3)
Chloride: 97 mmol/L — ABNORMAL LOW (ref 98–111)
Creatinine, Ser: 0.92 mg/dL (ref 0.61–1.24)
GFR calc Af Amer: >60 mL/min (ref >60)
GFR calc non Af Amer: >60 mL/min (ref >60)
Glucose, Bld: 113 mg/dL — ABNORMAL HIGH (ref 70–99)
Potassium: 4.6 mmol/L (ref 3.5–5.1)
Sodium: 137 mmol/L (ref 135–145)

## 2019-12-20 LAB — CBC
HCT: 50 % (ref 39.0–52.0)
Hemoglobin: 16.7 g/dL (ref 13.0–17.0)
MCH: 33.8 pg (ref 26.0–34.0)
MCHC: 33.4 g/dL (ref 30.0–36.0)
MCV: 101.2 fL — ABNORMAL HIGH (ref 80.0–100.0)
Platelets: 152 10*3/uL (ref 150–400)
RBC: 4.94 MIL/uL (ref 4.22–5.81)
RDW: 15.3 % (ref 11.5–15.5)
WBC: 7.4 10*3/uL (ref 4.0–10.5)
nRBC: 0 % (ref 0.0–0.2)

## 2019-12-20 LAB — PROCALCITONIN: Procalcitonin: <0.1 ng/mL

## 2019-12-20 LAB — RESPIRATORY PANEL BY RT PCR (FLU A&B, COVID)
Influenza A by PCR: NEGATIVE
Influenza B by PCR: NEGATIVE
SARS Coronavirus 2 by RT PCR: NEGATIVE

## 2019-12-20 LAB — HIV ANTIBODY (ROUTINE TESTING W REFLEX): HIV Screen 4th Generation wRfx: NONREACTIVE

## 2019-12-20 MED ORDER — ACETAMINOPHEN 325 MG PO TABS
650.0000 mg | ORAL_TABLET | Freq: Four times a day (QID) | ORAL | Status: AC | PRN
  Administered 2019-12-20: 650 mg via ORAL
  Filled 2019-12-20: qty 2

## 2019-12-20 MED ORDER — IOHEXOL 350 MG/ML SOLN
100.0000 mL | Freq: Once | INTRAVENOUS | Status: AC | PRN
  Administered 2019-12-20: 100 mL via INTRAVENOUS

## 2019-12-20 MED ORDER — ALBUTEROL SULFATE HFA 108 (90 BASE) MCG/ACT IN AERS
2.0000 | INHALATION_SPRAY | Freq: Four times a day (QID) | RESPIRATORY_TRACT | Status: DC | PRN
  Filled 2019-12-20: qty 6.7

## 2019-12-20 MED ORDER — RIVAROXABAN (XARELTO) VTE STARTER PACK (15 & 20 MG): 20.0000 mg | ORAL_TABLET | Freq: Every day | ORAL | Status: DC

## 2019-12-20 MED ORDER — RIVAROXABAN 15 MG PO TABS
15.0000 mg | ORAL_TABLET | Freq: Two times a day (BID) | ORAL | Status: DC
  Filled 2019-12-20 (×2): qty 1

## 2019-12-20 MED ORDER — RIVAROXABAN 20 MG PO TABS: 20.0000 mg | ORAL_TABLET | Freq: Every day | ORAL | Status: DC

## 2019-12-20 NOTE — ED Notes (Signed)
Report called to the floor.

## 2019-12-20 NOTE — Discharge Summary (Signed)
Matthew [name] DVV:[id number] DOB: 06-05-1971 DOA: 12/19/2019  PCP: Patient, No Pcp Per  Admit date: 12/19/2019 Discharge date: 12/20/2019 he signed out AMA  Admitted From: Home  Disposition: Patient signed out AMA    Discharge Condition:Stable CODE STATUS: Full   Brief/Interim Summary: Matthew [name] is 49 y.o. male with PMH of DVT on Eliquis, COPD, Afib, OSA who presents for fever. Patient also endorses some SOB and cough. Denies sore throat, chest pain, worsening of lower extremity edema, body aches, chills. Denies known lung disease. Reports he has home O2 but doesn't know why it was prescribed. He does not use it due to the electrical current not being strong enough in his apartment. He also has a history of OSA and has a CPAP but doesn't use it anymore because he was tired of it. Reports compliance with anticoagulation.  His Covid test was negative.  He was admitted to the hospital service.  He underwent a CTA that revealed Occlusion of the right middle lobe pulmonary artery at its origin, with a small amount of thrombus extending into the right lower lobar artery.Markedly enlarged main pulmonary artery, consistent with pulmonary hypertension (please see full results below). This a.m. I went to see the patient.  He was seen and examined.  I was discussing the CT scan results with the patient.  However patient was angry that he had to stay in the hospital and refused to stay for further treatment.  Explained to the patient that even though he is on anticoagulation at home that we will need to get a hematologist consult and place him on another anticoagulation since he did get pulmonary embolism with Xarelto.  Patient stated he did not want to stay and he will follow-up with his primary care.  I did discuss complications of pulmonary embolism and if he signed out AMA that even death may occur.  He still decided to sign AGAINST MEDICAL ADVICE.  He stated he has Xarelto at home and he will follow-up  with his primary care.  He stated the only reason he came to the hospital was because his primary care office was closed this weekend.  Patient proceeded to sign out AMA with the nursing staff.  Discharge Diagnoses:  Active Problems:   AF (paroxysmal atrial fibrillation) (HCC)   DVT (deep venous thrombosis) (HCC)   Tobacco abuse   COPD (chronic obstructive pulmonary disease) (HCC)   Sepsis ([place])   Community acquired pneumonia   Acute respiratory failure (HCC)   OSA (obstructive sleep apnea)    Discharge Instructions     Allergies  Allergen Reactions  . Amoxicillin Rash    Consultations:  None   Procedures/Studies: DG Chest 2 View  Result Date: 12/19/2019 CLINICAL DATA:  Suspected sepsis. Fever. Malaise. Wife diagnosed with COVID. EXAM: CHEST - 2 VIEW COMPARISON:  03/12/2019 FINDINGS: Cardiomegaly, unchanged. Mild interstitial opacities that appears similar to prior exam and are likely chronic. No confluent airspace disease. No pleural fluid or pneumothorax. No acute osseous abnormalities are seen. IMPRESSION: No acute findings or change from prior exam.  Chronic cardiomegaly. Electronically Signed   By: Keith Rake M.D.   On: 12/19/2019 19:55   CT Angio Chest PE W and/or Wo Contrast  Result Date: 12/20/2019 CLINICAL DATA:  Fever and hypoxia. Recent COVID-19 exposure. EXAM: CT ANGIOGRAPHY CHEST WITH CONTRAST TECHNIQUE: Multidetector CT imaging of the chest was performed using the standard protocol during bolus administration of intravenous contrast. Multiplanar CT image reconstructions and MIPs were obtained to evaluate the  vascular anatomy. CONTRAST:  OMNIPAQUE IOHEXOL 350 MG/ML SOLN COMPARISON:  None. FINDINGS: Cardiovascular: Contrast injection is sufficient to demonstrate satisfactory opacification of the pulmonary arteries to the segmental level. The right middle lobe pulmonary artery is occluded at its origin. There is a small amount of thrombus extending into the  right lower lobar artery. No left-sided filling defect. The size of the main pulmonary artery is markedly enlarged, measuring 4.6 cm. Moderate cardiomegaly. The right ventricle is mildly enlarged relative to the left with a ratio 1.3. The course and caliber of the aorta are normal. There is no atherosclerotic calcification. Opacification decreased due to pulmonary arterial phase contrast bolus timing. Mediastinum/Nodes: No mediastinal, hilar or axillary lymphadenopathy. Normal visualized thyroid. Thoracic esophageal course is normal. Lungs/Pleura: Mild multifocal opacity in the right lower lobe. No pleural effusion or pneumothorax. Airways are patent. No pulmonary infarct. Upper Abdomen: Contrast bolus timing is not optimized for evaluation of the abdominal organs. Cholelithiasis. Musculoskeletal: No chest wall abnormality. No bony spinal canal stenosis. Review of the MIP images confirms the above findings. IMPRESSION: 1. Occlusion of the right middle lobe pulmonary artery at its origin, with a small amount of thrombus extending into the right lower lobar artery. 2. Markedly enlarged main pulmonary artery, consistent with pulmonary hypertension. 3. RV/LV ratio of 1.3 may indicate a degree of right heart strain. This may be due to underlying chronic pulmonary hypertension. 4. Mild multifocal opacity in the right lower lobe may indicate aspiration or pneumonia. Critical Value/emergent results were called by telephone at the time of interpretation on 12/20/2019 at 1:18 am to provider Dr. Manson Passey, who verbally acknowledged these results. Electronically Signed   By: Deatra Robinson M.D.   On: 12/20/2019 01:19   VAS [place] LOWER EXTREMITY VENOUS (DVT)  Result Date: 11/24/2019  Lower Venous DVTStudy Risk Factors: DVT Right lower extremity. Comparison Study: 11/02/2019 Performing Technologist: Reece Agar RT (R)(VS)  Examination Guidelines: A complete evaluation includes B-mode imaging, spectral Doppler, color Doppler, and  power Doppler as needed of all accessible portions of each vessel. Bilateral testing is considered an integral part of a complete examination. Limited examinations for reoccurring indications may be performed as noted. The reflux portion of the exam is performed with the patient in reverse Trendelenburg.  +---------+---------------+---------+-----------+----------+-------------------+ RIGHT    CompressibilityPhasicitySpontaneityPropertiesThrombus Aging      +---------+---------------+---------+-----------+----------+-------------------+ CFV      None                                                             +---------+---------------+---------+-----------+----------+-------------------+ SFJ      None                                                             +---------+---------------+---------+-----------+----------+-------------------+ FV Prox  None                                                             +---------+---------------+---------+-----------+----------+-------------------+  FV Mid   None                                                             +---------+---------------+---------+-----------+----------+-------------------+ FV DistalPartial                                                          +---------+---------------+---------+-----------+----------+-------------------+ PFV      Full                                                             +---------+---------------+---------+-----------+----------+-------------------+ POP      Partial                                                          +---------+---------------+---------+-----------+----------+-------------------+ PTV                                                   Not visualized well +---------+---------------+---------+-----------+----------+-------------------+ GSV      None                                                              +---------+---------------+---------+-----------+----------+-------------------+ SSV      Full                                                             +---------+---------------+---------+-----------+----------+-------------------+  Summary: RIGHT: - Findings appear essentially unchanged compared to previous examination. - Occlusive thrombus is visualized in the CFV, SFJ, and proximal SFV. Partial compression in the mid SFV to popliteal vein. Posterior tibial vein not visualized well due to leg swelling. The right GSV appears non compressable from the SFJ to distal and appears to have partial compression at the level of the knee.  *See table(s) above for measurements and observations. Electronically signed by Festus BarrenJason Dew MD on 11/24/2019 at 3:07:36 PM.    Final        Subjective: Has no complaints.  He is angry for being in the hospital and wants to sign out AMA.  He denies having any issues with shortness of breath or chest pain  Discharge Exam: Vitals:   12/20/19 0610 12/20/19 0755  BP:  104/81  Pulse:  73  Resp:  Temp:    SpO2: 92% 97%   Vitals:   12/20/19 0000 12/20/19 0340 12/20/19 0610 12/20/19 0755  BP: 95/82 114/84  104/81  Pulse: (!) 107 (!) 102  73  Resp: (!) 23 20    Temp:  100 F (37.8 C)    TempSrc:  Oral    SpO2: 94% 93% 92% 97%  Weight:      Height:        General: Pt is alert, awake, not in acute distress Cardiovascular: RRR, S1/S2 +, no rubs, no gallops Respiratory: CTA bilaterally, no wheezing, no rhonchi Abdominal: Soft, NT, ND, bowel sounds + Extremities: Positive edema with chronic skin changes bilaterally    The results of significant diagnostics from this hospitalization (including imaging, microbiology, ancillary and laboratory) are listed below for reference.     Microbiology: Recent Results (from the past 240 hour(s))  Culture, blood (Routine x 2)     Status: None (Preliminary result)   Collection Time: 12/19/19  7:35 PM   Specimen:  BLOOD  Result Value Ref Range Status   Specimen Description BLOOD RIGHT ANTECUBITAL  Final   Special Requests   Final    BOTTLES DRAWN AEROBIC AND ANAEROBIC Blood Culture results may not be optimal due to an excessive volume of blood received in culture bottles   Culture   Final    NO GROWTH < 12 HOURS Performed at [facility], [address] [place], [place] [address]    Report Status PENDING  Incomplete  Culture, blood (Routine x 2)     Status: None (Preliminary result)   Collection Time: 12/19/19  7:36 PM   Specimen: BLOOD  Result Value Ref Range Status   Specimen Description BLOOD BLOOD RIGHT HAND  Final   Special Requests   Final    BOTTLES DRAWN AEROBIC AND ANAEROBIC Blood Culture adequate volume   Culture   Final    NO GROWTH < 12 HOURS Performed at [facility], [address], [place], [place] [address]    Report Status PENDING  Incomplete  Respiratory Panel by RT PCR (Flu A&B, Covid) - Nasopharyngeal Swab     Status: None   Collection Time: 12/19/19 11:26 PM   Specimen: Nasopharyngeal Swab  Result Value Ref Range Status   SARS Coronavirus 2 by RT PCR NEGATIVE NEGATIVE Final    Comment: (NOTE) SARS-CoV-2 target nucleic acids are NOT DETECTED. The SARS-CoV-2 RNA is generally detectable in upper respiratoy specimens during the acute phase of infection. The lowest concentration of SARS-CoV-2 viral copies this assay can detect is 131 copies/mL. A negative result does not preclude SARS-Cov-2 infection and should not be used as the sole basis for treatment or other patient management decisions. A negative result may occur with  improper specimen collection/handling, submission of specimen other than nasopharyngeal swab, presence of viral mutation(s) within the areas targeted by this assay, and inadequate number of viral copies (<131 copies/mL). A negative result must be combined with clinical observations, patient history, and epidemiological  information. The expected result is Negative. Fact Sheet for Patients:  https://www.moore.com/ Fact Sheet for Healthcare Providers:  https://www.young.biz/ This test is not yet ap proved or cleared by the [place] FDA and  has been authorized for detection and/or diagnosis of SARS-CoV-2 by FDA under an Emergency Use Authorization (EUA). This EUA will remain  in effect (meaning this test can be used) for the duration of the COVID-19 declaration under Section 564(b)(1) of the Act, 21 U.S.C. section 360bbb-3(b)(1), unless  the authorization is terminated or revoked sooner.    Influenza A by PCR NEGATIVE NEGATIVE Final   Influenza B by PCR NEGATIVE NEGATIVE Final    Comment: (NOTE) The Xpert Xpress SARS-CoV-2/FLU/RSV assay is intended as an aid in  the diagnosis of influenza from Nasopharyngeal swab specimens and  should not be used as a sole basis for treatment. Nasal washings and  aspirates are unacceptable for Xpert Xpress SARS-CoV-2/FLU/RSV  testing. Fact Sheet for Patients: https://www.moore.com/ Fact Sheet for Healthcare Providers: https://www.young.biz/ This test is not yet approved or cleared by the [place] FDA and  has been authorized for detection and/or diagnosis of SARS-CoV-2 by  FDA under an Emergency Use Authorization (EUA). This EUA will remain  in effect (meaning this test can be used) for the duration of the  Covid-19 declaration under Section 564(b)(1) of the Act, 21  U.S.C. section 360bbb-3(b)(1), unless the authorization is  terminated or revoked. Performed at [facility], [address] [place], [place] [address]      Labs: BNP (last 3 results) Recent Labs    03/10/19 1940  BNP 41.0   Basic Metabolic Panel: Recent Labs  Lab 12/19/19 1935 12/20/19 0432  NA 137 137  K 4.7 4.6  CL 99 97*  CO2 28 34*  GLUCOSE 107* 113*  BUN 7 7  CREATININE 0.84 0.92   CALCIUM 8.9 8.6*   Liver Function Tests: Recent Labs  Lab 12/19/19 1935  AST 19  ALT 16  ALKPHOS 149*  BILITOT 0.7  PROT 8.0  ALBUMIN 3.4*   No results for input(s): LIPASE, AMYLASE in the last 168 hours. No results for input(s): AMMONIA in the last 168 hours. CBC: Recent Labs  Lab 12/19/19 1935 12/20/19 0432  WBC 7.5 7.4  NEUTROABS 4.6  --   HGB 17.1* 16.7  HCT 52.3* 50.0  MCV 101.9* 101.2*  PLT 164 152   Cardiac Enzymes: Recent Labs  Lab 12/17/19 1427  CKTOTAL 39*   BNP: Invalid input(s): POCBNP CBG: No results for input(s): GLUCAP in the last 168 hours. D-Dimer No results for input(s): DDIMER in the last 72 hours. Hgb A1c No results for input(s): HGBA1C in the last 72 hours. Lipid Profile No results for input(s): CHOL, HDL, LDLCALC, TRIG, CHOLHDL, LDLDIRECT in the last 72 hours. Thyroid function studies No results for input(s): TSH, T4TOTAL, T3FREE, THYROIDAB in the last 72 hours.  Invalid input(s): FREET3 Anemia work up Recent Labs    12/17/19 1427  FERRITIN 111  TIBC 288  IRON 125   Urinalysis    Component Value Date/Time   COLORURINE STRAW (A) 06/03/2019 1426   APPEARANCEUR CLEAR (A) 06/03/2019 1426   APPEARANCEUR Clear 02/05/2019 1251   LABSPEC 1.005 06/03/2019 1426   PHURINE 6.0 06/03/2019 1426   GLUCOSEU NEGATIVE 06/03/2019 1426   HGBUR NEGATIVE 06/03/2019 1426   BILIRUBINUR NEGATIVE 06/03/2019 1426   BILIRUBINUR Negative 02/05/2019 1251   KETONESUR NEGATIVE 06/03/2019 1426   PROTEINUR NEGATIVE 06/03/2019 1426   NITRITE NEGATIVE 06/03/2019 1426   LEUKOCYTESUR NEGATIVE 06/03/2019 1426   Sepsis Labs Invalid input(s): PROCALCITONIN,  WBC,  LACTICIDVEN Microbiology Recent Results (from the past 240 hour(s))  Culture, blood (Routine x 2)     Status: None (Preliminary result)   Collection Time: 12/19/19  7:35 PM   Specimen: BLOOD  Result Value Ref Range Status   Specimen Description BLOOD RIGHT ANTECUBITAL  Final   Special Requests    Final    BOTTLES DRAWN AEROBIC AND ANAEROBIC Blood  Culture results may not be optimal due to an excessive volume of blood received in culture bottles   Culture   Final    NO GROWTH < 12 HOURS Performed at [facility], [address] [place], [place] [address]    Report Status PENDING  Incomplete  Culture, blood (Routine x 2)     Status: None (Preliminary result)   Collection Time: 12/19/19  7:36 PM   Specimen: BLOOD  Result Value Ref Range Status   Specimen Description BLOOD BLOOD RIGHT HAND  Final   Special Requests   Final    BOTTLES DRAWN AEROBIC AND ANAEROBIC Blood Culture adequate volume   Culture   Final    NO GROWTH < 12 HOURS Performed at [facility], [address], [place], [place] [address]    Report Status PENDING  Incomplete  Respiratory Panel by RT PCR (Flu A&B, Covid) - Nasopharyngeal Swab     Status: None   Collection Time: 12/19/19 11:26 PM   Specimen: Nasopharyngeal Swab  Result Value Ref Range Status   SARS Coronavirus 2 by RT PCR NEGATIVE NEGATIVE Final    Comment: (NOTE) SARS-CoV-2 target nucleic acids are NOT DETECTED. The SARS-CoV-2 RNA is generally detectable in upper respiratoy specimens during the acute phase of infection. The lowest concentration of SARS-CoV-2 viral copies this assay can detect is 131 copies/mL. A negative result does not preclude SARS-Cov-2 infection and should not be used as the sole basis for treatment or other patient management decisions. A negative result may occur with  improper specimen collection/handling, submission of specimen other than nasopharyngeal swab, presence of viral mutation(s) within the areas targeted by this assay, and inadequate number of viral copies (<131 copies/mL). A negative result must be combined with clinical observations, patient history, and epidemiological information. The expected result is Negative. Fact Sheet for Patients:  https://www.moore.com/ Fact  Sheet for Healthcare Providers:  https://www.young.biz/ This test is not yet ap proved or cleared by the [place] FDA and  has been authorized for detection and/or diagnosis of SARS-CoV-2 by FDA under an Emergency Use Authorization (EUA). This EUA will remain  in effect (meaning this test can be used) for the duration of the COVID-19 declaration under Section 564(b)(1) of the Act, 21 U.S.C. section 360bbb-3(b)(1), unless the authorization is terminated or revoked sooner.    Influenza A by PCR NEGATIVE NEGATIVE Final   Influenza B by PCR NEGATIVE NEGATIVE Final    Comment: (NOTE) The Xpert Xpress SARS-CoV-2/FLU/RSV assay is intended as an aid in  the diagnosis of influenza from Nasopharyngeal swab specimens and  should not be used as a sole basis for treatment. Nasal washings and  aspirates are unacceptable for Xpert Xpress SARS-CoV-2/FLU/RSV  testing. Fact Sheet for Patients: https://www.moore.com/ Fact Sheet for Healthcare Providers: https://www.young.biz/ This test is not yet approved or cleared by the [place] FDA and  has been authorized for detection and/or diagnosis of SARS-CoV-2 by  FDA under an Emergency Use Authorization (EUA). This EUA will remain  in effect (meaning this test can be used) for the duration of the  Covid-19 declaration under Section 564(b)(1) of the Act, 21  U.S.C. section 360bbb-3(b)(1), unless the authorization is  terminated or revoked. Performed at [facility], [address], [place], [place] [address]      Time coordinating discharge: Over 30 minutes  SIGNED:   Lynn Ito, MD  [facility] 12/20/2019, 12:30 PM Pager   If 7PM-7AM, please contact night-coverage [email or web address] Password [email or web address]

## 2019-12-20 NOTE — ED Notes (Signed)
Pt back from ct

## 2019-12-20 NOTE — Progress Notes (Signed)
Patient leaving AMA, Dr. Marylu Lund discussed with the patient that he needed to stay so we could care for him. Patient refused stating he did not want to stay. I got the patient to sign the AMA forms.    Matthew [name] A and O x4. VSS. Pt tolerating diet well. No complaints of nausea or vomiting. IV removed intact. Pt voices understanding of leaving AMA and the need to follow up with PCP. Patient discharged via wheelchair with RN.

## 2019-12-21 ENCOUNTER — Encounter (INDEPENDENT_AMBULATORY_CARE_PROVIDER_SITE_OTHER): Payer: Self-pay | Admitting: Nurse Practitioner

## 2019-12-21 ENCOUNTER — Other Ambulatory Visit: Payer: Self-pay

## 2019-12-21 ENCOUNTER — Ambulatory Visit (INDEPENDENT_AMBULATORY_CARE_PROVIDER_SITE_OTHER): Payer: Self-pay | Admitting: Nurse Practitioner

## 2019-12-21 VITALS — BP 111/75 | HR 89 | Ht 67.0 in | Wt 280.0 lb

## 2019-12-21 DIAGNOSIS — I480 Paroxysmal atrial fibrillation: Secondary | ICD-10-CM

## 2019-12-21 DIAGNOSIS — I824Y1 Acute embolism and thrombosis of unspecified deep veins of right proximal lower extremity: Secondary | ICD-10-CM

## 2019-12-21 DIAGNOSIS — Z720 Tobacco use: Secondary | ICD-10-CM

## 2019-12-21 DIAGNOSIS — I2699 Other pulmonary embolism without acute cor pulmonale: Secondary | ICD-10-CM

## 2019-12-22 ENCOUNTER — Other Ambulatory Visit: Payer: Self-pay

## 2019-12-22 ENCOUNTER — Encounter: Payer: Self-pay | Admitting: Emergency Medicine

## 2019-12-22 ENCOUNTER — Encounter (INDEPENDENT_AMBULATORY_CARE_PROVIDER_SITE_OTHER): Payer: Self-pay | Admitting: Nurse Practitioner

## 2019-12-22 ENCOUNTER — Inpatient Hospital Stay
Admission: EM | Admit: 2019-12-22 | Discharge: 2019-12-29 | DRG: 175 | Disposition: A | Discharge status: Home Health | Payer: Self-pay | Attending: Internal Medicine | Admitting: Internal Medicine

## 2019-12-22 ENCOUNTER — Encounter: Payer: Self-pay | Admitting: Gerontology

## 2019-12-22 ENCOUNTER — Ambulatory Visit: Payer: Self-pay | Admitting: Gerontology

## 2019-12-22 ENCOUNTER — Emergency Department: Payer: Self-pay

## 2019-12-22 DIAGNOSIS — Z20822 Contact with and (suspected) exposure to covid-19: Secondary | ICD-10-CM | POA: Diagnosis present

## 2019-12-22 DIAGNOSIS — Y92009 Unspecified place in unspecified non-institutional (private) residence as the place of occurrence of the external cause: Secondary | ICD-10-CM

## 2019-12-22 DIAGNOSIS — I482 Chronic atrial fibrillation, unspecified: Secondary | ICD-10-CM | POA: Diagnosis present

## 2019-12-22 DIAGNOSIS — I480 Paroxysmal atrial fibrillation: Secondary | ICD-10-CM | POA: Diagnosis present

## 2019-12-22 DIAGNOSIS — Z5329 Procedure and treatment not carried out because of patient's decision for other reasons: Secondary | ICD-10-CM | POA: Diagnosis present

## 2019-12-22 DIAGNOSIS — Z86718 Personal history of other venous thrombosis and embolism: Secondary | ICD-10-CM

## 2019-12-22 DIAGNOSIS — J441 Chronic obstructive pulmonary disease with (acute) exacerbation: Secondary | ICD-10-CM | POA: Diagnosis present

## 2019-12-22 DIAGNOSIS — I5021 Acute systolic (congestive) heart failure: Secondary | ICD-10-CM | POA: Diagnosis present

## 2019-12-22 DIAGNOSIS — Z95828 Presence of other vascular implants and grafts: Secondary | ICD-10-CM

## 2019-12-22 DIAGNOSIS — I824Y1 Acute embolism and thrombosis of unspecified deep veins of right proximal lower extremity: Secondary | ICD-10-CM

## 2019-12-22 DIAGNOSIS — D696 Thrombocytopenia, unspecified: Secondary | ICD-10-CM | POA: Diagnosis present

## 2019-12-22 DIAGNOSIS — I272 Pulmonary hypertension, unspecified: Secondary | ICD-10-CM | POA: Diagnosis present

## 2019-12-22 DIAGNOSIS — J9602 Acute respiratory failure with hypercapnia: Secondary | ICD-10-CM | POA: Diagnosis present

## 2019-12-22 DIAGNOSIS — I2699 Other pulmonary embolism without acute cor pulmonale: Principal | ICD-10-CM | POA: Diagnosis present

## 2019-12-22 DIAGNOSIS — Z880 Allergy status to penicillin: Secondary | ICD-10-CM

## 2019-12-22 DIAGNOSIS — Z91128 Patient's intentional underdosing of medication regimen for other reason: Secondary | ICD-10-CM

## 2019-12-22 DIAGNOSIS — Z6841 Body Mass Index (BMI) 40.0 and over, adult: Secondary | ICD-10-CM

## 2019-12-22 DIAGNOSIS — E875 Hyperkalemia: Secondary | ICD-10-CM | POA: Diagnosis not present

## 2019-12-22 DIAGNOSIS — R0602 Shortness of breath: Secondary | ICD-10-CM | POA: Insufficient documentation

## 2019-12-22 DIAGNOSIS — I4891 Unspecified atrial fibrillation: Secondary | ICD-10-CM

## 2019-12-22 DIAGNOSIS — G4733 Obstructive sleep apnea (adult) (pediatric): Secondary | ICD-10-CM | POA: Diagnosis present

## 2019-12-22 DIAGNOSIS — Z7901 Long term (current) use of anticoagulants: Secondary | ICD-10-CM

## 2019-12-22 DIAGNOSIS — K7460 Unspecified cirrhosis of liver: Secondary | ICD-10-CM | POA: Diagnosis present

## 2019-12-22 DIAGNOSIS — M1990 Unspecified osteoarthritis, unspecified site: Secondary | ICD-10-CM | POA: Diagnosis present

## 2019-12-22 DIAGNOSIS — J9621 Acute and chronic respiratory failure with hypoxia: Secondary | ICD-10-CM | POA: Diagnosis present

## 2019-12-22 DIAGNOSIS — F17210 Nicotine dependence, cigarettes, uncomplicated: Secondary | ICD-10-CM | POA: Diagnosis present

## 2019-12-22 DIAGNOSIS — G9349 Other encephalopathy: Secondary | ICD-10-CM | POA: Diagnosis present

## 2019-12-22 DIAGNOSIS — Z79899 Other long term (current) drug therapy: Secondary | ICD-10-CM

## 2019-12-22 DIAGNOSIS — I071 Rheumatic tricuspid insufficiency: Secondary | ICD-10-CM | POA: Diagnosis present

## 2019-12-22 DIAGNOSIS — Z720 Tobacco use: Secondary | ICD-10-CM

## 2019-12-22 DIAGNOSIS — I878 Other specified disorders of veins: Secondary | ICD-10-CM | POA: Diagnosis present

## 2019-12-22 DIAGNOSIS — Z9114 Patient's other noncompliance with medication regimen: Secondary | ICD-10-CM

## 2019-12-22 DIAGNOSIS — T45516A Underdosing of anticoagulants, initial encounter: Secondary | ICD-10-CM | POA: Diagnosis present

## 2019-12-22 LAB — BASIC METABOLIC PANEL
Anion gap: 9 (ref 5–15)
BUN: 12 mg/dL (ref 6–20)
CO2: 31 mmol/L (ref 22–32)
Calcium: 8.5 mg/dL — ABNORMAL LOW (ref 8.9–10.3)
Chloride: 98 mmol/L (ref 98–111)
Creatinine, Ser: 0.93 mg/dL (ref 0.61–1.24)
GFR calc Af Amer: >60 mL/min (ref >60)
GFR calc non Af Amer: >60 mL/min (ref >60)
Glucose, Bld: 122 mg/dL — ABNORMAL HIGH (ref 70–99)
Potassium: 4.2 mmol/L (ref 3.5–5.1)
Sodium: 138 mmol/L (ref 135–145)

## 2019-12-22 LAB — CBC WITH DIFFERENTIAL/PLATELET
Abs Immature Granulocytes: 0.04 10*3/uL (ref 0.00–0.07)
Basophils Absolute: 0.1 10*3/uL (ref 0.0–0.1)
Basophils Relative: 1 %
Eosinophils Absolute: 0 10*3/uL (ref 0.0–0.5)
Eosinophils Relative: 1 %
HCT: 48.5 % (ref 39.0–52.0)
Hemoglobin: 16 g/dL (ref 13.0–17.0)
Immature Granulocytes: 1 %
Lymphocytes Relative: 40 %
Lymphs Abs: 3.1 10*3/uL (ref 0.7–4.0)
MCH: 33.1 pg (ref 26.0–34.0)
MCHC: 33 g/dL (ref 30.0–36.0)
MCV: 100.2 fL — ABNORMAL HIGH (ref 80.0–100.0)
Monocytes Absolute: 1 10*3/uL (ref 0.1–1.0)
Monocytes Relative: 13 %
Neutro Abs: 3.5 10*3/uL (ref 1.7–7.7)
Neutrophils Relative %: 44 %
Platelets: 134 10*3/uL — ABNORMAL LOW (ref 150–400)
RBC: 4.84 MIL/uL (ref 4.22–5.81)
RDW: 15.1 % (ref 11.5–15.5)
WBC: 7.8 10*3/uL (ref 4.0–10.5)
nRBC: 0.4 % — ABNORMAL HIGH (ref 0.0–0.2)

## 2019-12-22 LAB — RESP PANEL BY RT PCR (RSV, FLU A&B, COVID)
Influenza A by PCR: NEGATIVE
Influenza B by PCR: NEGATIVE
Respiratory Syncytial Virus by PCR: POSITIVE — AB
SARS Coronavirus 2 by RT PCR: NEGATIVE

## 2019-12-22 LAB — PROTIME-INR
INR: 2 — ABNORMAL HIGH (ref 0.8–1.2)
Prothrombin Time: 22.9 seconds — ABNORMAL HIGH (ref 11.4–15.2)

## 2019-12-22 LAB — APTT
aPTT: 39 seconds — ABNORMAL HIGH (ref 24–36)
aPTT: 100 seconds — ABNORMAL HIGH (ref 24–36)

## 2019-12-22 LAB — RESPIRATORY PANEL BY RT PCR (FLU A&B, COVID)
Influenza A by PCR: NEGATIVE
Influenza B by PCR: NEGATIVE
SARS Coronavirus 2 by RT PCR: NEGATIVE

## 2019-12-22 LAB — BRAIN NATRIURETIC PEPTIDE: B Natriuretic Peptide: 146 pg/mL — ABNORMAL HIGH (ref 0.0–100.0)

## 2019-12-22 LAB — TROPONIN I (HIGH SENSITIVITY)
Troponin I (High Sensitivity): 56 ng/L — ABNORMAL HIGH (ref <18)
Troponin I (High Sensitivity): 59 ng/L — ABNORMAL HIGH (ref <18)

## 2019-12-22 LAB — HEPARIN LEVEL (UNFRACTIONATED): Heparin Unfractionated: >3.6 IU/mL — ABNORMAL HIGH (ref 0.30–0.70)

## 2019-12-22 MED ORDER — SODIUM CHLORIDE 0.9% FLUSH
3.0000 mL | Freq: Two times a day (BID) | INTRAVENOUS | Status: DC
  Administered 2019-12-23 – 2019-12-28 (×10): 3 mL via INTRAVENOUS

## 2019-12-22 MED ORDER — SODIUM CHLORIDE 0.9 % IV SOLN: 250.0000 mL | INTRAVENOUS | Status: DC | PRN

## 2019-12-22 MED ORDER — DILTIAZEM HCL ER COATED BEADS 120 MG PO CP24
120.0000 mg | ORAL_CAPSULE | Freq: Once | ORAL | Status: AC
  Administered 2019-12-22: 120 mg via ORAL
  Filled 2019-12-22: qty 1

## 2019-12-22 MED ORDER — HEPARIN BOLUS VIA INFUSION
5000.0000 [IU] | Freq: Once | INTRAVENOUS | Status: AC
  Administered 2019-12-22: 5000 [IU] via INTRAVENOUS
  Filled 2019-12-22: qty 5000

## 2019-12-22 MED ORDER — MORPHINE SULFATE (PF) 4 MG/ML IV SOLN
4.0000 mg | Freq: Once | INTRAVENOUS | Status: AC
  Administered 2019-12-22: 4 mg via INTRAVENOUS
  Filled 2019-12-22: qty 1

## 2019-12-22 MED ORDER — AZITHROMYCIN 250 MG PO TABS
500.0000 mg | ORAL_TABLET | Freq: Every day | ORAL | Status: AC
  Administered 2019-12-22 – 2019-12-24 (×3): 500 mg via ORAL
  Filled 2019-12-22: qty 2
  Filled 2019-12-22 (×2): qty 1

## 2019-12-22 MED ORDER — HEPARIN (PORCINE) 25000 UT/250ML-% IV SOLN
1650.0000 [IU]/h | INTRAVENOUS | Status: DC
  Administered 2019-12-22 – 2019-12-24 (×4): 1500 [IU]/h via INTRAVENOUS
  Administered 2019-12-25: 1650 [IU]/h via INTRAVENOUS
  Filled 2019-12-22 (×5): qty 250

## 2019-12-22 MED ORDER — FUROSEMIDE 10 MG/ML IJ SOLN
40.0000 mg | Freq: Once | INTRAMUSCULAR | Status: AC
  Administered 2019-12-22: 40 mg via INTRAVENOUS
  Filled 2019-12-22: qty 4

## 2019-12-22 MED ORDER — SODIUM CHLORIDE 0.9% FLUSH
3.0000 mL | INTRAVENOUS | Status: DC | PRN
  Administered 2019-12-24: 3 mL via INTRAVENOUS

## 2019-12-22 MED ORDER — NICOTINE 14 MG/24HR TD PT24
14.0000 mg | MEDICATED_PATCH | Freq: Every day | TRANSDERMAL | Status: DC
  Administered 2019-12-22 – 2019-12-29 (×8): 14 mg via TRANSDERMAL
  Filled 2019-12-22 (×8): qty 1

## 2019-12-22 MED ORDER — ACETAMINOPHEN 650 MG RE SUPP: 650.0000 mg | Freq: Four times a day (QID) | RECTAL | Status: DC | PRN

## 2019-12-22 MED ORDER — ONDANSETRON HCL 4 MG/2ML IJ SOLN: 4.0000 mg | Freq: Four times a day (QID) | INTRAMUSCULAR | Status: DC | PRN

## 2019-12-22 MED ORDER — ALBUTEROL (5 MG/ML) CONTINUOUS INHALATION SOLN
10.0000 mg/h | INHALATION_SOLUTION | Freq: Once | RESPIRATORY_TRACT | Status: AC
  Administered 2019-12-22: 10 mg/h via RESPIRATORY_TRACT
  Filled 2019-12-22: qty 20

## 2019-12-22 MED ORDER — SODIUM CHLORIDE 0.9 % IV SOLN: INTRAVENOUS | Status: DC

## 2019-12-22 MED ORDER — DILTIAZEM HCL 25 MG/5ML IV SOLN
10.0000 mg | Freq: Once | INTRAVENOUS | Status: AC
  Administered 2019-12-22: 10 mg via INTRAVENOUS
  Filled 2019-12-22: qty 5

## 2019-12-22 MED ORDER — DILTIAZEM HCL-DEXTROSE 125-5 MG/125ML-% IV SOLN (PREMIX)
5.0000 mg/h | INTRAVENOUS | Status: DC
  Administered 2019-12-22: 5 mg/h via INTRAVENOUS
  Administered 2019-12-23: 2.5 mg/h via INTRAVENOUS
  Filled 2019-12-22 (×2): qty 125

## 2019-12-22 MED ORDER — DILTIAZEM HCL ER COATED BEADS 120 MG PO CP24
120.0000 mg | ORAL_CAPSULE | Freq: Every day | ORAL | Status: DC
  Filled 2019-12-22: qty 1

## 2019-12-22 MED ORDER — IPRATROPIUM-ALBUTEROL 0.5-2.5 (3) MG/3ML IN SOLN
3.0000 mL | Freq: Four times a day (QID) | RESPIRATORY_TRACT | Status: DC | PRN
  Administered 2019-12-23: 3 mL via RESPIRATORY_TRACT
  Filled 2019-12-22: qty 3

## 2019-12-22 MED ORDER — ALBUTEROL SULFATE (2.5 MG/3ML) 0.083% IN NEBU
INHALATION_SOLUTION | RESPIRATORY_TRACT | Status: AC
  Filled 2019-12-22: qty 12

## 2019-12-22 MED ORDER — METHYLPREDNISOLONE SODIUM SUCC 40 MG IJ SOLR
40.0000 mg | Freq: Four times a day (QID) | INTRAMUSCULAR | Status: DC
  Administered 2019-12-22 – 2019-12-25 (×10): 40 mg via INTRAVENOUS
  Filled 2019-12-22 (×10): qty 1

## 2019-12-22 MED ORDER — ONDANSETRON HCL 4 MG PO TABS: 4.0000 mg | ORAL_TABLET | Freq: Four times a day (QID) | ORAL | Status: DC | PRN

## 2019-12-22 MED ORDER — ACETAMINOPHEN 325 MG PO TABS: 650.0000 mg | ORAL_TABLET | Freq: Four times a day (QID) | ORAL | Status: DC | PRN

## 2019-12-22 MED ORDER — PREDNISONE 20 MG PO TABS
40.0000 mg | ORAL_TABLET | Freq: Every day | ORAL | Status: DC
  Administered 2019-12-22: 40 mg via ORAL
  Filled 2019-12-22: qty 2

## 2019-12-22 NOTE — ED Notes (Signed)
Per lab patient is RSV+, COVID and flu negative. Dr Mayford Knife notified via secure chat.

## 2019-12-22 NOTE — Consult Note (Signed)
[facility] Vascular Consult Note  MRN : [id number]  Matthew [name] is a 49 y.o. (August 18, 1971) male who presents with chief complaint of  Chief Complaint  Patient presents with  . Shortness of Breath   History of Present Illness:  The patient is a 49 year old male well-known to our service for recurrent DVT on chronic anticoagulation mostly noncompliant status post thrombolysis and thrombectomy of the right lower extremity most recently on November 05, 2019.  The patient has multiple medical issues (see below).  The patient notes progressively worsening shortness of breath x2 weeks.  The patient had sought medical attention 2 days ago in our emergency department and was found to have PE however left AGAINST MEDICAL ADVICE.  Patient historically has been noncompliant with oral anticoagulation however he states he has been taking it.  Patient does have an IVC filter in place.  Denies any lower extremity pain or discomfort.  Patient is also in A. fib with RVR.  He has been admitted to the ICU and started on heparin drip.  Vascular surgery was consulted by Dr. Karna Christmas for possible vascular intervention.  Current Facility-Administered Medications  Medication Dose Route Frequency Provider Last Rate Last Admin  . 0.9 %  sodium chloride infusion  250 mL Intravenous PRN Agbata, Tochukwu, MD      . acetaminophen (TYLENOL) tablet 650 mg  650 mg Oral Q6H PRN Agbata, Tochukwu, MD       Or  . acetaminophen (TYLENOL) suppository 650 mg  650 mg Rectal Q6H PRN Agbata, Tochukwu, MD      . albuterol (PROVENTIL) (2.5 MG/3ML) 0.083% nebulizer solution           . azithromycin (ZITHROMAX) tablet 500 mg  500 mg Oral Daily Vida Rigger, MD      . Melene Muller ON 12/23/2019] diltiazem (CARDIZEM CD) 24 hr capsule 120 mg  120 mg Oral Daily Agbata, Tochukwu, MD      . diltiazem (CARDIZEM) 125 mg in dextrose 5% 125 mL (1 mg/mL) infusion  5-15 mg/hr Intravenous Titrated Agbata, Tochukwu, MD 15 mL/hr  at 12/22/19 1756 15 mg/hr at 12/22/19 1756  . heparin ADULT infusion 100 units/mL (25000 units/255mL sodium chloride 0.45%)  1,500 Units/hr Intravenous Continuous Agbata, Tochukwu, MD 15 mL/hr at 12/22/19 1415 1,500 Units/hr at 12/22/19 1415  . ipratropium-albuterol (DUONEB) 0.5-2.5 (3) MG/3ML nebulizer solution 3 mL  3 mL Nebulization Q6H PRN Agbata, Tochukwu, MD      . methylPREDNISolone sodium succinate (SOLU-MEDROL) 40 mg/mL injection 40 mg  40 mg Intravenous Q6H Aleskerov, Fuad, MD   Stopped at 12/22/19 1723  . nicotine (NICODERM CQ - dosed in mg/24 hours) patch 14 mg  14 mg Transdermal Daily Agbata, Tochukwu, MD   14 mg at 12/22/19 1617  . ondansetron (ZOFRAN) tablet 4 mg  4 mg Oral Q6H PRN Agbata, Tochukwu, MD       Or  . ondansetron (ZOFRAN) injection 4 mg  4 mg Intravenous Q6H PRN Agbata, Tochukwu, MD      . sodium chloride flush (NS) 0.9 % injection 3 mL  3 mL Intravenous Q12H Agbata, Tochukwu, MD      . sodium chloride flush (NS) 0.9 % injection 3 mL  3 mL Intravenous PRN Agbata, Tochukwu, MD       Current Outpatient Medications  Medication Sig Dispense Refill  . Rivaroxaban 15 & 20 MG TBPK Follow package directions: Take one 15mg  tablet by mouth twice a day. On day 22, switch to one 20mg  tablet once  a day. Take with food. (Patient taking differently: Take 20 mg by mouth daily. Follow package directions: Take one  tablet by mouth twice a day. On day 22, switch to one  tablet once a day. Take with food.) 51 each 0  . albuterol (VENTOLIN HFA) 108 (90 Base) MCG/ACT inhaler Inhale 2 puffs into the lungs every 6 (six) hours as needed for wheezing or shortness of breath. 18 g 0  . nicotine (NICODERM CQ - DOSED IN MG/24 HOURS) 14 mg/24hr patch One  patch chest wall daily (okay to substitute generic) (Patient not taking: Reported on 11/23/2019) 28 patch 0   Past Medical History:  Diagnosis Date  . Arthritis   . Hx of blood clots   . Sleep apnea    patient states not anymore    Past Surgical History:  Procedure Laterality Date  . IVC FILTER INSERTION Left 11/24/2018   Procedure: IVC FILTER INSERTION;  Surgeon: Annice Needy, MD;  Location: [facility];  Service: Cardiovascular;  Laterality: Left;  . IVC FILTER INSERTION Right 02/18/2019   Procedure: IVC FILTER INSERTION WITH RIGHT LOWER EXTREMITY VENOUS LYSIS;  Surgeon: Renford Dills, MD;  Location: [facility];  Service: Cardiovascular;  Laterality: Right;  . IVC FILTER REMOVAL N/A 12/01/2018   Procedure: IVC FILTER REMOVAL;  Surgeon: Annice Needy, MD;  Location: [facility];  Service: Cardiovascular;  Laterality: N/A;  . NO PAST SURGERIES    . PERIPHERAL VASCULAR THROMBECTOMY Right 10/30/2019   Procedure: PERIPHERAL VASCULAR THROMBECTOMY / THROMBOLYSIS;  Surgeon: Annice Needy, MD;  Location: [facility];  Service: Cardiovascular;  Laterality: Right;  . PERIPHERAL VASCULAR THROMBECTOMY Right 11/05/2019   Procedure: PERIPHERAL VASCULAR THROMBECTOMY / THROMBOLYSIS;  Surgeon: Annice Needy, MD;  Location: [facility];  Service: Cardiovascular;  Laterality: Right;   Social History Social History   Tobacco Use  . Smoking status: Current Every Day Smoker    Packs/day: 0.50    Types: Cigarettes  . Smokeless tobacco: Current User  Substance Use Topics  . Alcohol use: Yes    Comment: weekends says every third weekend a 12 pack  . Drug use: Never   Family History History reviewed. No pertinent family history.  Denies family history of peripheral artery disease, venous disease or renal disease.  Allergies  Allergen Reactions  . Amoxicillin Rash   REVIEW OF SYSTEMS (Negative unless checked)  Constitutional: Weight loss  Fever  Chills Cardiac: Chest pain   Chest pressure   Palpitations   Shortness of breath when laying flat   Shortness of breath at rest   Shortness of breath with exertion. Vascular:  Pain in legs with walking   Pain in legs at  rest   Pain in legs when laying flat   Claudication   Pain in feet when walking  Pain in feet at rest  Pain in feet when laying flat   History of DVT   Phlebitis   Swelling in legs   Varicose veins   Non-healing ulcers Pulmonary:   Uses home oxygen   Productive cough   Hemoptysis   Wheeze  COPD   Asthma Neurologic:  Dizziness  Blackouts   Seizures   History of stroke   History of TIA  Aphasia   Temporary blindness   Dysphagia   Weakness or numbness in arms   Weakness or numbness in legs Musculoskeletal:  Arthritis   Joint swelling   Joint pain     Low back pain Hematologic:  [] Easy bruising  [] Easy bleeding   [] Hypercoagulable state   [] Anemic  [] Hepatitis Gastrointestinal:  [] Blood in stool   [] Vomiting blood  [] Gastroesophageal reflux/heartburn   [] Difficulty swallowing. Genitourinary:  [] Chronic kidney disease   [] Difficult urination  [] Frequent urination  [] Burning with urination   [] Blood in urine Skin:  [] Rashes   [] Ulcers   [] Wounds Psychological:  [] History of anxiety   []  History of major depression.  Physical Examination  Vitals:   12/22/19 1623 12/22/19 1630 12/22/19 1700 12/22/19 1730  BP:  136/70 110/86 (!) 124/97  Pulse: 70 (!) 145  (!) 56  Resp: (!) 27 (!) 28 (!) 23 (!) 25  Temp:      TempSrc:      SpO2: 91% (!) 89% 95% 92%  Weight:      Height:       Body mass index is 44.32 kg/m. Gen:  WD/WN, on BiPAP however is alert and conversing. Head: [place]/AT, No temporalis wasting. Prominent temp pulse not noted. Ear/Nose/Throat: Hearing grossly intact, nares w/o erythema or drainage, oropharynx w/o Erythema/Exudate Eyes: Sclera non-icteric, conjunctiva clear Neck: Trachea midline.  No JVD.  Pulmonary:  Good air movement, respirations labored, equal bilaterally.  Cardiac: Tachycardic Vascular:  Vessel Right Left  Radial Palpable Palpable  Ulnar Palpable Palpable  Brachial Palpable Palpable  Carotid Palpable, without  bruit Palpable, without bruit  Aorta Not palpable N/A  Femoral Palpable Palpable  Popliteal Palpable Palpable  PT Non-Palpable Non-Palpable  DP Non-Palpable Non-Palpable   Bilateral lower extremity: Mild to moderate edema which is generally his baseline.  Venous stasis skin changes noted.  Hard to palpate pedal pulses which is his baseline however the extremities are warm distally to toes.  Motor/sensory is intact.  Gastrointestinal: soft, non-tender/non-distended. No guarding/reflex.  Musculoskeletal: M/S 5/5 throughout.  Extremities without ischemic changes.  No deformity or atrophy.  Neurologic: Sensation grossly intact in extremities.  Symmetrical.  Speech is fluent. Motor exam as listed above. Psychiatric: Judgment intact, Mood & affect appropriate for pt's clinical situation. Dermatologic: No rashes or ulcers noted.  No cellulitis or open wounds. Lymph : No Cervical, Axillary, or Inguinal lymphadenopathy.  CBC Lab Results  Component Value Date   WBC 7.8 12/22/2019   HGB 16.0 12/22/2019   HCT 48.5 12/22/2019   MCV 100.2 (H) 12/22/2019   PLT 134 (L) 12/22/2019   BMET    Component Value Date/Time   NA 138 12/22/2019 1136   NA 143 09/10/2018 0930   K 4.2 12/22/2019 1136   CL 98 12/22/2019 1136   CO2 31 12/22/2019 1136   GLUCOSE 122 (H) 12/22/2019 1136   BUN 12 12/22/2019 1136   BUN 8 09/10/2018 0930   CREATININE 0.93 12/22/2019 1136   CALCIUM 8.5 (L) 12/22/2019 1136   GFRNONAA >60 12/22/2019 1136   GFRAA >60 12/22/2019 1136   Estimated Creatinine Clearance: 123.7 mL/min (by C-G formula based on SCr of 0.93 mg/dL).  COAG Lab Results  Component Value Date   INR 2.0 (H) 12/22/2019   INR 1.1 12/19/2019   INR 1.2 11/04/2019   Radiology DG Chest 2 View  Result Date: 12/19/2019 CLINICAL DATA:  Suspected sepsis. Fever. Malaise. Wife diagnosed with COVID. EXAM: CHEST - 2 VIEW COMPARISON:  03/12/2019 FINDINGS: Cardiomegaly, unchanged. Mild interstitial opacities that  appears similar to prior exam and are likely chronic. No confluent airspace disease. No pleural fluid or pneumothorax. No acute osseous abnormalities are seen. IMPRESSION: No acute findings or change from prior exam.  Chronic cardiomegaly. Electronically Signed   By: Keith Rake M.D.   On: 12/19/2019 19:55   CT Angio Chest PE W and/or Wo Contrast  Result Date: 12/20/2019 CLINICAL DATA:  Fever and hypoxia. Recent COVID-19 exposure. EXAM: CT ANGIOGRAPHY CHEST WITH CONTRAST TECHNIQUE: Multidetector CT imaging of the chest was performed using the standard protocol during bolus administration of intravenous contrast. Multiplanar CT image reconstructions and MIPs were obtained to evaluate the vascular anatomy. CONTRAST:  136mL OMNIPAQUE IOHEXOL 350 MG/ML SOLN COMPARISON:  None. FINDINGS: Cardiovascular: Contrast injection is sufficient to demonstrate satisfactory opacification of the pulmonary arteries to the segmental level. The right middle lobe pulmonary artery is occluded at its origin. There is a small amount of thrombus extending into the right lower lobar artery. No left-sided filling defect. The size of the main pulmonary artery is markedly enlarged, measuring 4.6 cm. Moderate cardiomegaly. The right ventricle is mildly enlarged relative to the left with a ratio 1.3. The course and caliber of the aorta are normal. There is no atherosclerotic calcification. Opacification decreased due to pulmonary arterial phase contrast bolus timing. Mediastinum/Nodes: No mediastinal, hilar or axillary lymphadenopathy. Normal visualized thyroid. Thoracic esophageal course is normal. Lungs/Pleura: Mild multifocal opacity in the right lower lobe. No pleural effusion or pneumothorax. Airways are patent. No pulmonary infarct. Upper Abdomen: Contrast bolus timing is not optimized for evaluation of the abdominal organs. Cholelithiasis. Musculoskeletal: No chest wall abnormality. No bony spinal canal stenosis. Review of the MIP  images confirms the above findings. IMPRESSION: 1. Occlusion of the right middle lobe pulmonary artery at its origin, with a small amount of thrombus extending into the right lower lobar artery. 2. Markedly enlarged main pulmonary artery, consistent with pulmonary hypertension. 3. RV/LV ratio of 1.3 may indicate a degree of right heart strain. This may be due to underlying chronic pulmonary hypertension. 4. Mild multifocal opacity in the right lower lobe may indicate aspiration or pneumonia. Critical Value/emergent results were called by telephone at the time of interpretation on 12/20/2019 at 1:18 am to provider Dr. Owens Shark, who verbally acknowledged these results. Electronically Signed   By: Ulyses Jarred M.D.   On: 12/20/2019 01:19   DG Chest Port 1 View  Result Date: 12/22/2019 CLINICAL DATA:  Shortness of breath and decreased oxygen saturation today. EXAM: PORTABLE CHEST 1 VIEW COMPARISON:  PA and lateral chest 12/19/2019 and 03/12/2019. CT chest 12/20/2019. FINDINGS: There is cardiomegaly and mild interstitial edema. No consolidative process, pneumothorax or effusion. No acute or focal bony abnormality. IMPRESSION: Cardiomegaly and mild interstitial pulmonary edema. Electronically Signed   By: Inge Rise M.D.   On: 12/22/2019 12:16   VAS [place] LOWER EXTREMITY VENOUS (DVT)  Result Date: 11/24/2019  Lower Venous DVTStudy Risk Factors: DVT Right lower extremity. Comparison Study: 11/02/2019 Performing Technologist: Charlane Ferretti RT (R)(VS)  Examination Guidelines: A complete evaluation includes B-mode imaging, spectral Doppler, color Doppler, and power Doppler as needed of all accessible portions of each vessel. Bilateral testing is considered an integral part of a complete examination. Limited examinations for reoccurring indications may be performed as noted. The reflux portion of the exam is performed with the patient in reverse Trendelenburg.   +---------+---------------+---------+-----------+----------+-------------------+ RIGHT    CompressibilityPhasicitySpontaneityPropertiesThrombus Aging      +---------+---------------+---------+-----------+----------+-------------------+ CFV      None                                                             +---------+---------------+---------+-----------+----------+-------------------+  SFJ      None                                                             +---------+---------------+---------+-----------+----------+-------------------+ FV Prox  None                                                             +---------+---------------+---------+-----------+----------+-------------------+ FV Mid   None                                                             +---------+---------------+---------+-----------+----------+-------------------+ FV DistalPartial                                                          +---------+---------------+---------+-----------+----------+-------------------+ PFV      Full                                                             +---------+---------------+---------+-----------+----------+-------------------+ POP      Partial                                                          +---------+---------------+---------+-----------+----------+-------------------+ PTV                                                   Not visualized well +---------+---------------+---------+-----------+----------+-------------------+ GSV      None                                                             +---------+---------------+---------+-----------+----------+-------------------+ SSV      Full                                                             +---------+---------------+---------+-----------+----------+-------------------+  Summary: RIGHT: - Findings appear essentially unchanged compared to previous  examination. - Occlusive thrombus is  visualized in the CFV, SFJ, and proximal SFV. Partial compression in the mid SFV to popliteal vein. Posterior tibial vein not visualized well due to leg swelling. The right GSV appears non compressable from the SFJ to distal and appears to have partial compression at the level of the knee.  *See table(s) above for measurements and observations. Electronically signed by Festus Barren MD on 11/24/2019 at 3:07:36 PM.    Final    Assessment/Plan The patient is a 49 year old male well-known to our service for recurrent DVT on chronic anticoagulation mostly noncompliant status post thrombolysis and thrombectomy of the right lower extremity most recently on November 05, 2019 found to have pulmonary embolus approximately 2 days ago however left our ED AGAINST MEDICAL ADVICE.  1.  Pulmonary embolism: Reviewed CTA with Dr. Wyn Quaker.  From our perspective, the patient's thrombus burden is small.  Most likely non-acute.  He has an enlarged pulmonary artery and chronic pulmonary hypertension.  His chronic pulmonary issues in the setting of A. fib with RVR can be a significant factor in his hypoxia.  At this time, there is no role for endovascular intervention.  Agree with initiation of heparin and rate control.  We will make the patient n.p.o. after midnight and continue to follow in case status deteriorates.  2.  IVC filter in place: Patient with recurrent DVT and setting of noncompliance with oral anticoagulation. The patient does have an IVC filter in place. No signs on physical exam that this would be thrombosed.  3.  Tobacco abuse: We had a discussion for approximately five minutes regarding the absolute need for smoking cessation due to the deleterious nature of tobacco on the vascular system. We discussed the tobacco use would diminish patency of any intervention, and likely significantly worsen progression of disease. We discussed multiple agents for quitting including replacement  therapy or medications to reduce cravings such as Chantix. The patient voices their understanding of the importance of smoking cessation.  Discussed with Dr. Weldon Inches, PA-C  12/22/2019 6:13 PM  This note was created with Dragon medical transcription system.  Any error is purely unintentional.

## 2019-12-22 NOTE — ED Notes (Signed)
After continuous neb finished, patient placed back on [place]. Patient with consistent sats in the mid to high 80s on 6L [place]. Transitioned to venti mask, 14L 50%. Sats remain consistently <90%. MD kept up to date via secure chat. RT called for bipap. Patient remains awake alert conversational, although tachypneic. Updated on POC as well.

## 2019-12-22 NOTE — H&P (Signed)
History and Physical    Matthew [name] XLK:[id number] DOB: 1971-04-19 DOA: 12/22/2019  PCP: Patient, No Pcp Per   Patient coming from: Home  I have personally briefly reviewed patient's old medical records in [facility]  Chief Complaint: Shortness of breath  HPI: Matthew [name] is a 49 y.o. 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  [facility] and was reportedly hypoxic and tachypneic with pulse oximetry of 67% on room air.  He was placed on 4 L of oxygen and he received bronchodilator therapy as well as IV Solu-Medrol. Patient recently signed out AGAINST MEDICAL ADVICE 2 days prior to his readmission.  During that hospitalization he had a CT angiogram of the chest that revealed occlusion of the right middle lobe pulmonary artery at its origin with a small amount of thrombus extending into the right lower lobe artery.  Markedly enlarged main pulmonary artery consistent with pulmonary hypertension.  The CT scan findings were discussed with patient and heparin drip was recommended since it was assumed that he had failed therapy with Xarelto.  Patient refused to have heparin administered and signed out AGAINST MEDICAL ADVICE Chest x-ray showed cardiomegaly and mild interstitial pulmonary edema. Twelve-lead EKG showed atrial fibrillation with rapid ventricular rate  ED Course: The patient had presented for difficulty breathing and hypoxia. Patient's labs revealed a mildly elevated troponin which is likely demand related. Patient's imaging reveals cardiomegaly and mild interstitial pulmonary edema.  He was given a dose of Cardizem both IV and oral for  rate control of his A. fib.  Patient states has been noncompliant.  He had received duo nebs and steroids for COPD and is still wheezing requiring supplemental oxygen.    Review of Systems: As per HPI otherwise 10 point review of systems negative.    Past Medical History:  Diagnosis Date   Arthritis    Hx of blood clots    Sleep apnea    patient states not anymore    Past Surgical History:  Procedure Laterality Date   IVC FILTER INSERTION Left 11/24/2018   Procedure: IVC FILTER INSERTION;  Surgeon: Annice Needy, MD;  Location: [facility] INVASIVE CV LAB;  Service: Cardiovascular;  Laterality: Left;   IVC FILTER INSERTION Right 02/18/2019   Procedure: IVC FILTER INSERTION WITH RIGHT LOWER EXTREMITY VENOUS LYSIS;  Surgeon: Renford Dills, MD;  Location: [facility] INVASIVE CV LAB;  Service: Cardiovascular;  Laterality: Right;   IVC FILTER REMOVAL N/A 12/01/2018   Procedure: IVC FILTER REMOVAL;  Surgeon: Annice Needy, MD;  Location: [facility] INVASIVE CV LAB;  Service: Cardiovascular;  Laterality: N/A;   NO PAST SURGERIES     PERIPHERAL VASCULAR THROMBECTOMY Right 10/30/2019   Procedure: PERIPHERAL VASCULAR THROMBECTOMY / THROMBOLYSIS;  Surgeon: Annice Needy, MD;  Location: [facility] INVASIVE CV LAB;  Service: Cardiovascular;  Laterality: Right;   PERIPHERAL VASCULAR THROMBECTOMY Right 11/05/2019   Procedure: PERIPHERAL VASCULAR THROMBECTOMY / THROMBOLYSIS;  Surgeon: Annice Needy, MD;  Location: [facility] INVASIVE CV LAB;  Service: Cardiovascular;  Laterality: Right;     reports that he has been smoking cigarettes. He has been smoking about 0.50 packs per day. He uses smokeless tobacco. He reports current alcohol use. He reports that  he does not use drugs.  Allergies  Allergen Reactions   Amoxicillin Rash    History reviewed. No pertinent family history.   Prior to Admission medications   Medication Sig Start Date End Date Taking? Authorizing Provider  Rivaroxaban 15 & 20 MG TBPK Follow package  directions: Take one 15mg  tablet by mouth twice a day. On day 22, switch to one 20mg  tablet once a day. Take with food. Patient taking differently: Take 20 mg by mouth daily. Follow package directions: Take one 15mg  tablet by mouth twice a day. On day 22, switch to one 20mg  tablet once a day. Take with food. 11/06/19  Yes Wieting, Richard, MD  albuterol (VENTOLIN HFA) 108 (90 Base) MCG/ACT inhaler Inhale 2 puffs into the lungs every 6 (six) hours as needed for wheezing or shortness of breath. 11/06/19   , MD  nicotine (NICODERM CQ - DOSED IN MG/24 HOURS) 14 mg/24hr patch One 14mg  patch chest wall daily (okay to substitute generic) Patient not taking: Reported on 11/23/2019 11/06/19   01/06/20, MD    Physical Exam: Vitals:   12/22/19 1330 12/22/19 1400 12/22/19 1430 12/22/19 1500  BP: (!) 134/107 127/81 127/80 125/90  Pulse: 76 66 (!) 126 74  Resp: (!) 23 (!) 35 (!) 23 (!) 29  Temp:      TempSrc:      SpO2: 92% 93% 95% 93%  Weight:      Height:         Vitals:   12/22/19 1330 12/22/19 1400 12/22/19 1430 12/22/19 1500  BP: (!) 134/107 127/81 127/80 125/90  Pulse: 76 66 (!) 126 74  Resp: (!) 23 (!) 35 (!) 23 (!) 29  Temp:      TempSrc:      SpO2: 92% 93% 95% 93%  Weight:      Height:        Constitutional: NAD, alert and oriented x 3, morbidly obese Eyes: PERRL, lids and conjunctivae normal ENMT: Mucous membranes are moist.  Neck: normal, supple, no masses, no thyromegaly Respiratory: Diffuse wheezing, no crackles. Normal respiratory effort. No accessory muscle use.  Cardiovascular: Irregularly irregular, tachycardic no murmurs / rubs / gallops. No extremity edema. 2+ pedal pulses. No carotid bruits.  Abdomen: no tenderness, no masses palpated. No hepatosplenomegaly. Bowel sounds positive.  Central adiposity Musculoskeletal: no clubbing / cyanosis. No joint deformity upper and lower extremities.  Skin: no rashes, lesions, ulcers.  Neurologic: No gross focal  neurologic deficit. Psychiatric: Normal mood and affect.   Labs on Admission: I have personally reviewed following labs and imaging studies  CBC: Recent Labs  Lab 12/19/19 1935 12/20/19 0432 12/22/19 1136  WBC 7.5 7.4 7.8  NEUTROABS 4.6  --  3.5  HGB 17.1* 16.7 16.0  HCT 52.3* 50.0 48.5  MCV 101.9* 101.2* 100.2*  PLT 164 152 134*   Basic Metabolic Panel: Recent Labs  Lab 12/19/19 1935 12/20/19 0432 12/22/19 1136  NA 137 137 138  K 4.7 4.6 4.2  CL 99 97* 98  CO2 28 34* 31  GLUCOSE 107* 113* 122*  BUN 7 7 12   CREATININE 0.84 0.92 0.93  CALCIUM 8.9 8.6* 8.5*   GFR: Estimated Creatinine Clearance: 123.7 mL/min (by C-G formula based on SCr of 0.93 mg/dL). Liver Function Tests: Recent Labs  Lab 12/19/19 1935  AST 19  ALT 16  ALKPHOS 149*  BILITOT 0.7  PROT 8.0  ALBUMIN 3.4*   No results for input(s): LIPASE, AMYLASE in the last 168 hours.  No results for input(s): AMMONIA in the last 168 hours. Coagulation Profile: Recent Labs  Lab 12/19/19 1935 12/22/19 1136  INR 1.1 2.0*   Cardiac Enzymes: Recent Labs  Lab 12/17/19 1427  CKTOTAL 39*   BNP (last 3 results) No results for input(s): PROBNP in the last 8760 hours. HbA1C: No results for input(s): HGBA1C in the last 72 hours. CBG: No results for input(s): GLUCAP in the last 168 hours. Lipid Profile: No results for input(s): CHOL, HDL, LDLCALC, TRIG, CHOLHDL, LDLDIRECT in the last 72 hours. Thyroid Function Tests: No results for input(s): TSH, T4TOTAL, FREET4, T3FREE, THYROIDAB in the last 72 hours. Anemia Panel: No results for input(s): VITAMINB12, FOLATE, FERRITIN, TIBC, IRON, RETICCTPCT in the last 72 hours. Urine analysis:    Component Value Date/Time   COLORURINE STRAW (A) 06/03/2019 1426   APPEARANCEUR CLEAR (A) 06/03/2019 1426   APPEARANCEUR Clear 02/05/2019 1251   LABSPEC 1.005 06/03/2019 1426   PHURINE 6.0 06/03/2019 1426   GLUCOSEU NEGATIVE 06/03/2019 1426   HGBUR NEGATIVE 06/03/2019  1426   BILIRUBINUR NEGATIVE 06/03/2019 1426   BILIRUBINUR Negative 02/05/2019 1251   KETONESUR NEGATIVE 06/03/2019 1426   PROTEINUR NEGATIVE 06/03/2019 1426   NITRITE NEGATIVE 06/03/2019 1426   LEUKOCYTESUR NEGATIVE 06/03/2019 1426    Radiological Exams on Admission: DG Chest Port 1 View  Result Date: 12/22/2019 CLINICAL DATA:  Shortness of breath and decreased oxygen saturation today. EXAM: PORTABLE CHEST 1 VIEW COMPARISON:  PA and lateral chest 12/19/2019 and 03/12/2019. CT chest 12/20/2019. FINDINGS: There is cardiomegaly and mild interstitial edema. No consolidative process, pneumothorax or effusion. No acute or focal bony abnormality. IMPRESSION: Cardiomegaly and mild interstitial pulmonary edema. Electronically Signed   By: Inge Rise M.D.   On: 12/22/2019 12:16    EKG: Independently reviewed.  Atrial fibrillation with a rapid ventricular rate  Assessment/Plan Principal Problem:   Pulmonary embolism (HCC) Active Problems:   COPD with acute exacerbation (HCC)   Class 3 severe obesity without serious comorbidity with body mass index (BMI) of 40.0 to 44.9 in adult [facility])   Acute on chronic respiratory failure with hypoxia (HCC)   Atrial fibrillation with RVR (HCC)   Acute pulmonary embolism (HCC)   Non compliance w medication regimen    Acute pulmonary embolism Patient had a CT angiogram of the chest which was done on 12/20/18 and it showed occlusion of the right middle lobe pulmonary artery at its origin, with a small amount of thrombus extending into the right lower lobar artery. Markedly enlarged main pulmonary artery, consistent with pulmonary hypertension. RV/LV ratio of 1.3 may indicate a degree of right heart strain. This may be due to underlying chronic pulmonary hypertension. Mild multifocal opacity in the right lower lobe may indicate aspiration or pneumonia. The CT scan findings were discussed with patient on 04/18/21and heparin drip was recommended but he  declined and signed out [place] stating that he would continue to take his Xarelto at home Start patient on heparin drip ?? failure of Xarelto therapy Patient admits to noncompliance with medication We will request pulmonology consult Obtain 2D echocardiogram to assess LVEF  Acute hypoxic respiratory failure Multifactorial related to acute COPD exacerbation as well as acute PE Continue oxygen supplementation to maintain pulse oximetry greater than 92% Patient will need to be assessed for home oxygen need prior to discharge   COPD with acute exacerbation Continue scheduled and as needed bronchodilator therapy Continue systemic steroids   Atrial fibrillation with rapid ventricular rate Continue Cardizem drip.  For rate control Continue heparin as primary prophylaxis for an acute stroke   Morbid obesity (BMI 44) Complicates overall prognosis and care     DVT prophylaxis: Heparin Code Status: Full code Family Communication: Greater than 50% of time was spent discussing plan of care with patient at the bedside, try to discuss the CT scan findings with him again but he was reluctant to talk about it and once his respiratory distress to be treated while he is here. Disposition Plan: Back to previous home environment Consults called:  Pulmonary, Cardiology    Lucile Shutters MD [facility]     12/22/2019, 3:44 PM

## 2019-12-22 NOTE — ED Provider Notes (Signed)
[facility] Emergency Department Provider Note       Time seen: ----------------------------------------- 11:54 AM on 12/22/2019 -----------------------------------------   I have reviewed the triage vital signs and the nursing notes.  HISTORY   Chief Complaint Shortness of Breath    HPI Matthew [name] is a 49 y.o. male with a history of arthritis, PEs, sleep apnea, COPD, tobacco abuse who presents to the ED for respiratory distress.  Patient arrives by EMS from home due to difficulty breathing.  [facility] and was reportedly hypoxic.  Sats were 83% on room air.  EMS gave him 4 duo nebs and 125 mg Solu-Medrol.  Recently left AMA with PEs.  Past Medical History:  Diagnosis Date  . Arthritis   . Hx of blood clots   . Sleep apnea    patient states not anymore    Patient Active Problem List   Diagnosis Date Noted  . Shortness of breath 12/22/2019  . COPD (chronic obstructive pulmonary disease) (HCC) 12/19/2019  . Sepsis (HCC) 12/19/2019  . Community acquired pneumonia 12/19/2019  . Acute respiratory failure (HCC) 12/19/2019  . OSA (obstructive sleep apnea) 12/19/2019  . Venous ulcer (HCC) 12/09/2019  . COPD with acute exacerbation (HCC)   . Class 3 severe obesity without serious comorbidity with body mass index (BMI) of 40.0 to 44.9 in adult [facility])   . Other cirrhosis of liver (HCC)   . Hypomagnesemia   . Tobacco abuse 10/29/2019  . Prediabetes 04/25/2019  . Wheeze 04/25/2019  . Ingrown toenail of right foot 04/23/2019  . AF (paroxysmal atrial fibrillation) (HCC) 03/19/2019  . DVT (deep venous thrombosis) (HCC) 03/19/2019  . Bilateral pneumonia 03/11/2019  . Acute DVT (deep venous thrombosis) (HCC) 02/17/2019  . DVT of deep femoral vein, right (HCC) 11/19/2018    Past Surgical History:  Procedure Laterality Date  . IVC FILTER INSERTION Left 11/24/2018   Procedure: IVC FILTER INSERTION;  Surgeon: Annice Needy, MD;   Location: [facility] INVASIVE CV LAB;  Service: Cardiovascular;  Laterality: Left;  . IVC FILTER INSERTION Right 02/18/2019   Procedure: IVC FILTER INSERTION WITH RIGHT LOWER EXTREMITY VENOUS LYSIS;  Surgeon: Renford Dills, MD;  Location: [facility] INVASIVE CV LAB;  Service: Cardiovascular;  Laterality: Right;  . IVC FILTER REMOVAL N/A 12/01/2018   Procedure: IVC FILTER REMOVAL;  Surgeon: Annice Needy, MD;  Location: [facility] INV[facility] CV [facility];  Service: Cardiovascular;  Laterality: N/A;  . NO PAST SURGERIES    . PERIPHERAL VASCULAR THROMBECTOMY Right 10/30/2019   Procedure: PERIPHERAL VASCULAR THROMBECTOMY / THROMBOLYSIS;  Surgeon: Annice Needy, MD;  Location: [facility] INVASIVE CV LAB;  Service: Cardiovascular;  Laterality: Right;  . PERIPHERAL VASCULAR THROMBECTOMY Right 11/05/2019   Procedure: PERIPHERAL VASCULAR THROMBECTOMY / THROMBOLYSIS;  Surgeon: Annice Needy, MD;  Location: [facility] INVASIVE CV LAB;  Service: Cardiovascular;  Laterality: Right;    Allergies Amoxicillin  Social History Social History   Tobacco Use  . Smoking status: Current Every Day Smoker    Packs/day: 0.50    Types: Cigarettes  . Smokeless tobacco: Current User  Substance Use Topics  . Alcohol use: Yes    Comment: weekends says every third weekend a 12 pack  . Drug use: Never    Review of Systems Constitutional: Negative for fever. Cardiovascular: Negative for chest pain. Respiratory: Positive for shortness of breath Gastrointestinal: Negative for abdominal pain, vomiting and diarrhea. Musculoskeletal: Negative for back pain. Skin: Negative for rash. Neurological: Negative for headaches,  focal weakness or numbness.  All systems negative/normal/unremarkable except as stated in the HPI  ____________________________________________   PHYSICAL EXAM:  VITAL SIGNS: ED Triage Vitals  Enc Vitals Group     BP 12/22/19 1130 113/89     Pulse Rate 12/22/19 1128 (!) 133     Resp 12/22/19 1128 (!) 24     Temp 12/22/19 1132  97.8 F (36.6 C)     Temp Source 12/22/19 1132 Axillary     SpO2 12/22/19 1130 97 %     Weight 12/22/19 1128 283 lb (128.4 kg)     Height 12/22/19 1128 5\' 7"  (1.702 m)     Head Circumference --      Peak Flow --      Pain Score 12/22/19 1129 8     Pain Loc --      Pain Edu? --      Excl. in GC? --     Constitutional: Alert and oriented.  Mild distress Eyes: Conjunctivae are normal. Normal extraocular movements. ENT      Head: Normocephalic and atraumatic.      Nose: No congestion/rhinnorhea.      Mouth/Throat: Mucous membranes are moist.      Neck: No stridor. Cardiovascular: Normal rate, regular rhythm. No murmurs, rubs, or gallops. Respiratory: Mild tachypnea with wheezing bilaterally Gastrointestinal: Soft and nontender. Normal bowel sounds Musculoskeletal: Nontender with normal range of motion in extremities.  Bilateral pitting edema is noted Neurologic:  Normal speech and language. No gross focal neurologic deficits are appreciated.  Skin:  Skin is warm, dry and intact. No rash noted. Psychiatric: Mood and affect are normal. Speech and behavior are normal.  ____________________________________________  EKG: Interpreted by me.  Atrial fibrillation with a rate of 140 bpm, incomplete right bundle branch block, left anterior fascicular block, likely early repolarization abnormality  ____________________________________________  ED COURSE:  As part of my medical decision making, I reviewed the following data within the electronic MEDICAL RECORD NUMBER History obtained from family if available, nursing notes, old chart and ekg, as well as notes from prior ED visits. Patient presented for dyspnea, we will assess with labs and imaging as indicated at this time.   Procedures  Matthew [name] was evaluated in Emergency Department on 12/22/2019 for the symptoms described in the history of present illness. He was evaluated in the context of the global COVID-19 pandemic, which necessitated  consideration that the patient might be at risk for infection with the SARS-CoV-2 virus that causes COVID-19. Institutional protocols and algorithms that pertain to the evaluation of patients at risk for COVID-19 are in a state of rapid change based on information released by regulatory bodies including the CDC and federal and state organizations. These policies and algorithms were followed during the patient's care in the ED.  ____________________________________________   LABS (pertinent positives/negatives)  Labs Reviewed  CBC WITH DIFFERENTIAL/PLATELET - Abnormal; Notable for the following components:      Result Value   MCV 100.2 (*)    Platelets 134 (*)    nRBC 0.4 (*)    All other components within normal limits  BASIC METABOLIC PANEL - Abnormal; Notable for the following components:   Glucose, Bld 122 (*)    Calcium 8.5 (*)    All other components within normal limits  BRAIN NATRIURETIC PEPTIDE - Abnormal; Notable for the following components:   B Natriuretic Peptide 146.0 (*)    All other components within normal limits  TROPONIN I (HIGH SENSITIVITY) - Abnormal; Notable  for the following components:   Troponin I (High Sensitivity) 56 (*)    All other components within normal limits  RESPIRATORY PANEL BY RT PCR (FLU A&B, COVID)   CRITICAL CARE Performed by: Laurence Aly   Total critical care time: 30 minutes  Critical care time was exclusive of separately billable procedures and treating other patients.  Critical care was necessary to treat or prevent imminent or life-threatening deterioration.  Critical care was time spent personally by me on the following activities: development of treatment plan with patient and/or surrogate as well as nursing, discussions with consultants, evaluation of patient's response to treatment, examination of patient, obtaining history from patient or surrogate, ordering and performing treatments and interventions, ordering and review of  laboratory studies, ordering and review of radiographic studies, pulse oximetry and re-evaluation of patient's condition.   RADIOLOGY Images were viewed by me  Chest x-ray IMPRESSION:  Cardiomegaly and mild interstitial pulmonary edema.  ____________________________________________   DIFFERENTIAL DIAGNOSIS   CHF, COPD, pneumonia, PE, COVID-19, pneumothorax  FINAL ASSESSMENT AND PLAN  COPD exacerbation, rapid atrial fibrillation, hypoxia   Plan: The patient had presented for difficulty breathing and hypoxia. Patient's labs revealed a mildly elevated troponin which is likely demand related. Patient's imaging reveals cardiomegaly and mild interstitial pulmonary edema.  He was given a dose of Cardizem both IV and oral for rate control of his A. fib.  Patient states has been noncompliant.  He had received duo nebs and steroids for COPD and is still wheezing requiring supplemental oxygen.  I will discuss with the hospitalist for admission.   Laurence Aly, MD    Note: This note was generated in part or whole with voice recognition software. Voice recognition is usually quite accurate but there are transcription errors that can and very often do occur. I apologize for any typographical errors that were not detected and corrected.     Earleen Newport, MD 12/22/19 1302

## 2019-12-22 NOTE — Progress Notes (Signed)
Subjective:    Patient ID: Matthew [name], male    DOB: [birth year], 49 y.o.   MRN: [id number] Chief Complaint  Patient presents with  . Follow-up    unna boot check    Patient presents today for evaluation of lower extremities following placement of Unna wraps due to swelling.  The patient had a right lower extremity deep vein thrombosis that underwent thrombectomy twice.  The patient has a previous history of DVT and had an IVC filter in place.  Previous venogram done during thrombectomy a patent IVC filter.  The patient was placed on Xarelto and states that he has been compliant, however the patient has a history of being noncompliant with anticoagulation.  The patient was recently admitted to [facility] due to having shortness of breath and was found to have a pulmonary embolism.  The patient also has a history of atrial fibrillation.  The patient states that he left AMA because "they wanted to put a tube in his nose and down his throat".  The patient endorses some shortness of breath but denies any chest pain.  He denies any fever, chills, nausea, vomiting or diarrhea.  We discussed in the possibility return to the hospital for treatment of his pulmonary embolism the patient became agitated.   Review of Systems  Respiratory: Positive for shortness of breath. Negative for chest tightness.   Cardiovascular: Positive for leg swelling.  Psychiatric/Behavioral: Positive for agitation.  All other systems reviewed and are negative.      Objective:   Physical Exam Vitals reviewed.  Cardiovascular:     Rate and Rhythm: Rhythm irregularly irregular.     Pulses:          Radial pulses are 2+ on the right side and 2+ on the left side.  Pulmonary:     Breath sounds: Examination of the right-lower field reveals wheezing. Examination of the left-lower field reveals wheezing. Wheezing present.  Musculoskeletal:     Right lower leg: 1+ Edema present.     Left lower leg: 1+  Edema present.  Skin:    General: Skin is warm.       Neurological:     Mental Status: He is alert and oriented to person, place, and time.  Psychiatric:        Attention and Perception: Attention normal.        Mood and Affect: Mood normal. Affect is angry.        Behavior: Behavior is uncooperative and agitated.        Thought Content: Thought content normal.        Judgment: Judgment is impulsive.     BP 111/75   Pulse 89   Ht 5\' 7"  (1.702 m)   Wt 280 lb (127 kg)   BMI 43.85 kg/m   Past Medical History:  Diagnosis Date  . Arthritis   . Hx of blood clots   . Sleep apnea    patient states not anymore    Social History   Socioeconomic History  . Marital status: Married    Spouse name:  . Number of children: 4  . Years of education: Not on file  . Highest education level: Not on file  Occupational History  . Not on file  Tobacco Use  . Smoking status: Current Every Day Smoker    Packs/day: 0.50    Types: Cigarettes  . Smokeless tobacco: Current User  Substance and Sexual Activity  . Alcohol use: Yes  Comment: weekends says every third weekend a 12 pack  . Drug use: Never  . Sexual activity: Yes  Other Topics Concern  . Not on file  Social History Narrative  . Not on file   Social Determinants of Health   Financial Resource Strain:   . Difficulty of Paying Living Expenses:   Food Insecurity:   . Worried About Charity fundraiser in the Last Year:   . Arboriculturist in the Last Year:   Transportation Needs:   . Film/video editor (Medical):   Marland Kitchen Lack of Transportation (Non-Medical):   Physical Activity:   . Days of Exercise per Week:   . Minutes of Exercise per Session:   Stress:   . Feeling of Stress :   Social Connections:   . Frequency of Communication with Friends and Family:   . Frequency of Social Gatherings with Friends and Family:   . Attends Religious Services:   . Active Member of Clubs or Organizations:   . Attends  Archivist Meetings:   Marland Kitchen Marital Status:   Intimate Partner Violence:   . Fear of Current or Ex-Partner:   . Emotionally Abused:   Marland Kitchen Physically Abused:   . Sexually Abused:     Past Surgical History:  Procedure Laterality Date  . IVC FILTER INSERTION Left 11/24/2018   Procedure: IVC FILTER INSERTION;  Surgeon: Algernon Huxley, MD;  Location: [place] [facility];  Service: Cardiovascular;  Laterality: Left;  . IVC FILTER INSERTION Right 02/18/2019   Procedure: IVC FILTER INSERTION WITH RIGHT LOWER EXTREMITY VENOUS LYSIS;  Surgeon: Katha Cabal, MD;  Location: [place] [facility];  Service: Cardiovascular;  Laterality: Right;  . IVC FILTER REMOVAL N/A 12/01/2018   Procedure: IVC FILTER REMOVAL;  Surgeon: Algernon Huxley, MD;  Location: [place] [facility];  Service: Cardiovascular;  Laterality: N/A;  . NO PAST SURGERIES    . PERIPHERAL VASCULAR THROMBECTOMY Right 10/30/2019   Procedure: PERIPHERAL VASCULAR THROMBECTOMY / THROMBOLYSIS;  Surgeon: Algernon Huxley, MD;  Location: [place] [facility];  Service: Cardiovascular;  Laterality: Right;  . PERIPHERAL VASCULAR THROMBECTOMY Right 11/05/2019   Procedure: PERIPHERAL VASCULAR THROMBECTOMY / THROMBOLYSIS;  Surgeon: Algernon Huxley, MD;  Location: [place] [facility];  Service: Cardiovascular;  Laterality: Right;    History reviewed. No pertinent family history.  Allergies  Allergen Reactions  . Amoxicillin Rash       Assessment & Plan:   1. Acute deep vein thrombosis (DVT) of proximal vein of right lower extremity (HCC) Patient's lower extremity edema associated with his DVT has greatly reduced.  The patient is advised for medical grade 1 compression stockings however he states that they cut off his circulation.  He is still urged to return to room for swelling as well as postphlebitic symptoms.  The patient does have a previous IVC filter in place from previous history of DVTs.  Recent venogram show that the IVC filter was patent.   We will have the patient return to the office in approximately 8 weeks to repeat DVT scan to determine if thrombus has transitioned to a chronic state.  2. AF (paroxysmal atrial fibrillation) ([place]) Patient has a history of paroxysmal atrial fibrillation.  This could be a possible cause of the patient's pulmonary embolism.  Patient states that he has been compliant with anticoagulation however as mentioned below that is somewhat questionable.  3. Tobacco abuse Smoking cessation was discussed, 3-10 minutes spent on this  topic specifically   4. Acute pulmonary embolism, unspecified pulmonary embolism type, unspecified whether acute cor pulmonale present [facility]) Patient left AMA from [facility] on 12/19/19 after subsequent discovery of a pulmonary embolism.  The patient states that he has been compliant with his Xarelto however that is questionable due to his previous history of not being compliant with his anticoagulation.  Patient advised to ensure compliance with his Xarelto as well as if he begins to feel short of breath, chest pain, faint or anxious he should head back to [facility] for treatment.   Current Outpatient Medications on File Prior to Visit  Medication Sig Dispense Refill  . albuterol (VENTOLIN HFA) 108 (90 Base) MCG/ACT inhaler Inhale 2 puffs into the lungs every 6 (six) hours as needed for wheezing or shortness of breath. 18 g 0  . silver sulfADIAZINE (SILVADENE) 1 % cream Apply to affected area daily 50 g 1  . nicotine (NICODERM CQ - DOSED IN MG/24 HOURS) 14 mg/24hr patch One 14mg  patch chest wall daily (okay to substitute generic) (Patient not taking: Reported on 11/23/2019) 28 patch 0  . Rivaroxaban 15 & 20 MG TBPK Follow package directions: Take one 15mg  tablet by mouth twice a day. On day 22, switch to one 20mg  tablet once a day. Take with food. (Patient not taking: Reported on 12/21/2019) 51 each 0   No current facility-administered  medications on file prior to visit.    There are no Patient Instructions on file for this visit. No follow-ups on file.   , NP

## 2019-12-22 NOTE — Progress Notes (Signed)
Pt. Placed on 10L High Flow Nasal Cannula maintaining SpO2 94-96%.  No distress noted. Will continue to assess. BIPAP placed on Standby.

## 2019-12-22 NOTE — Consult Note (Signed)
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  Patient came into the hospital due to progressive worsening of shortness of breath was noted to be hypoxemic less than 70% which improved with 4 L of supplemental oxygen and IV Solu-Medrol. He was in the hospital a few days ago and signed out AGAINST MEDICAL ADVICE.  Patient had a CT PE 12/20/2019 with right middle lobe arterial thrombosis extending into the right lower lobe pulmonary artery. Discussed findings and care plan with patient.   PAST MEDICAL HISTORY   Past Medical History:  Diagnosis Date  . Arthritis   . Hx of blood clots   . Sleep apnea    patient states not anymore     SURGICAL HISTORY   Past Surgical History:  Procedure Laterality Date  . IVC FILTER INSERTION Left 11/24/2018   Procedure: IVC FILTER INSERTION;  Surgeon: Annice Needyew, Jason S, MD;  Location: [facility];  Service: Cardiovascular;  Laterality: Left;  . IVC FILTER INSERTION Right 02/18/2019   Procedure: IVC FILTER INSERTION WITH RIGHT LOWER EXTREMITY VENOUS LYSIS;  Surgeon: Renford DillsSchnier, Gregory G, MD;  Location: [facility];  Service: Cardiovascular;  Laterality: Right;  . IVC FILTER REMOVAL N/A 12/01/2018   Procedure: IVC FILTER REMOVAL;  Surgeon: Annice Needyew, Jason S, MD;  Location: [facility];  Service: Cardiovascular;  Laterality: N/A;  . NO PAST SURGERIES    . PERIPHERAL VASCULAR THROMBECTOMY Right 10/30/2019   Procedure:  PERIPHERAL VASCULAR THROMBECTOMY / THROMBOLYSIS;  Surgeon: Annice Needyew, Jason S, MD;  Location: [facility];  Service: Cardiovascular;  Laterality: Right;  . PERIPHERAL VASCULAR THROMBECTOMY Right 11/05/2019   Procedure: PERIPHERAL VASCULAR THROMBECTOMY / THROMBOLYSIS;  Surgeon: Annice Needyew, Jason S, MD;  Location: [facility];  Service: Cardiovascular;  Laterality: Right;     FAMILY HISTORY   History reviewed. No pertinent family history.   SOCIAL HISTORY   Social History   Tobacco Use  . Smoking status: Current Every Day Smoker    Packs/day: 0.50    Types: Cigarettes  . Smokeless tobacco: Current User  Substance Use Topics  . Alcohol use: Yes    Comment: weekends says every third weekend a 12 pack  . Drug use: Never     MEDICATIONS    Home Medication:  Current Outpatient Rx  . Order #: [id number] Class: Normal  . Order #: [id number] Class: Normal  . Order #: [id number] Class: Normal    Current Medication:  Current Facility-Administered Medications:  .  0.9 %  sodium chloride infusion, 250 mL, Intravenous, PRN, Agbata, Tochukwu, MD .  acetaminophen (TYLENOL) tablet 650 mg, 650 mg, Oral, Q6H PRN **OR** acetaminophen (TYLENOL) suppository 650 mg, 650 mg, Rectal, Q6H PRN, Agbata, Tochukwu, MD .  albuterol (PROVENTIL) (2.5 MG/3ML) 0.083% nebulizer solution, , , ,  .  [START ON 12/23/2019] diltiazem (CARDIZEM CD) 24 hr capsule 120 mg,  120 mg, Oral, Daily, Agbata, Tochukwu, MD .  diltiazem (CARDIZEM) 125 mg in dextrose 5% 125 mL (1 mg/mL) infusion, 5-15 mg/hr, Intravenous, Titrated, Agbata, Tochukwu, MD .  heparin ADULT infusion 100 units/mL (25000 units/276mL sodium chloride 0.45%), 1,500 Units/hr, Intravenous, Continuous, Agbata, Tochukwu, MD, Last Rate: 15 mL/hr at 12/22/19 1415, 1,500 Units/hr at 12/22/19 1415 .  ipratropium-albuterol (DUONEB) 0.5-2.5 (3) MG/3ML nebulizer solution 3 mL, 3 mL, Nebulization, Q6H PRN, Agbata, Tochukwu, MD .  nicotine (NICODERM CQ - dosed in mg/24  hours) patch 14 mg, 14 mg, Transdermal, Daily, Agbata, Tochukwu, MD .  ondansetron (ZOFRAN) tablet 4 mg, 4 mg, Oral, Q6H PRN **OR** ondansetron (ZOFRAN) injection 4 mg, 4 mg, Intravenous, Q6H PRN, Agbata, Tochukwu, MD .  sodium chloride flush (NS) 0.9 % injection 3 mL, 3 mL, Intravenous, Q12H, Agbata, Tochukwu, MD .  sodium chloride flush (NS) 0.9 % injection 3 mL, 3 mL, Intravenous, PRN, Agbata, Tochukwu, MD  Current Outpatient Medications:  .  Rivaroxaban 15 & 20 MG TBPK, Follow package directions: Take one 15mg  tablet by mouth twice a day. On day 22, switch to one 20mg  tablet once a day. Take with food. (Patient taking differently: Take 20 mg by mouth daily. Follow package directions: Take one 15mg  tablet by mouth twice a day. On day 22, switch to one 20mg  tablet once a day. Take with food.), Disp: 51 each, Rfl: 0 .  albuterol (VENTOLIN HFA) 108 (90 Base) MCG/ACT inhaler, Inhale 2 puffs into the lungs every 6 (six) hours as needed for wheezing or shortness of breath., Disp: 18 g, Rfl: 0 .  nicotine (NICODERM CQ - DOSED IN MG/24 HOURS) 14 mg/24hr patch, One 14mg  patch chest wall daily (okay to substitute generic) (Patient not taking: Reported on 11/23/2019), Disp: 28 patch, Rfl: 0    ALLERGIES   Amoxicillin     REVIEW OF SYSTEMS    Review of Systems:  Gen:  Denies  fever, sweats, chills weigh loss  HEENT: Denies blurred vision, double vision, ear pain, eye pain, hearing loss, nose bleeds, sore throat Cardiac:  No dizziness, chest pain or heaviness, chest tightness,edema Resp:   Denies cough or sputum porduction, shortness of breath,wheezing, hemoptysis,  Gi: Denies swallowing difficulty, stomach pain, nausea or vomiting, diarrhea, constipation, bowel incontinence Gu:  Denies bladder incontinence, burning urine Ext:   Denies Joint pain, stiffness or swelling Skin: Denies  skin rash, easy bruising or bleeding or hives Endoc:  Denies polyuria, polydipsia , polyphagia or weight  change Psych:   Denies depression, insomnia or hallucinations   Other:  All other systems negative   VS: BP 125/90   Pulse 74   Temp 97.8 F (36.6 C) (Axillary)   Resp (!) 29   Ht 5\' 7"  (1.702 m)   Wt 128.4 kg   SpO2 93%   BMI 44.32 kg/m      PHYSICAL EXAM    GENERAL:NAD, no fevers, chills, no weakness no fatigue HEAD: Normocephalic, atraumatic.  EYES: Pupils equal, round, reactive to light. Extraocular muscles intact. No scleral icterus.  MOUTH: Moist mucosal membrane. Dentition intact. No abscess noted.  EAR, NOSE, THROAT: Clear without exudates. No external lesions.  NECK: Supple. No thyromegaly. No nodules. No JVD.  PULMONARY: Diffuse coarse rhonchi right sided +wheezes CARDIOVASCULAR: S1 and S2. Regular rate and rhythm. No murmurs, rubs, or gallops. No edema. Pedal pulses 2+ bilaterally.  GASTROINTESTINAL: Soft, nontender, nondistended. No masses. Positive bowel sounds. No hepatosplenomegaly.  MUSCULOSKELETAL: No swelling, clubbing, or edema. Range of motion  full in all extremities.  NEUROLOGIC: Cranial nerves II through XII are intact. No gross focal neurological deficits. Sensation intact. Reflexes intact.  SKIN: No ulceration, lesions, rashes, or cyanosis. Skin warm and dry. Turgor intact.  PSYCHIATRIC: Mood, affect within normal limits. The patient is awake, alert and oriented x 3. Insight, judgment intact.       IMAGING    DG Chest 2 View  Result Date: 12/19/2019 CLINICAL DATA:  Suspected sepsis. Fever. Malaise. Wife diagnosed with COVID. EXAM: CHEST - 2 VIEW COMPARISON:  03/12/2019 FINDINGS: Cardiomegaly, unchanged. Mild interstitial opacities that appears similar to prior exam and are likely chronic. No confluent airspace disease. No pleural fluid or pneumothorax. No acute osseous abnormalities are seen. IMPRESSION: No acute findings or change from prior exam.  Chronic cardiomegaly. Electronically Signed   By: Narda Rutherford M.D.   On: 12/19/2019 19:55   CT  Angio Chest PE W and/or Wo Contrast  Result Date: 12/20/2019 CLINICAL DATA:  Fever and hypoxia. Recent COVID-19 exposure. EXAM: CT ANGIOGRAPHY CHEST WITH CONTRAST TECHNIQUE: Multidetector CT imaging of the chest was performed using the standard protocol during bolus administration of intravenous contrast. Multiplanar CT image reconstructions and MIPs were obtained to evaluate the vascular anatomy. CONTRAST:  OMNIPAQUE IOHEXOL 350 MG/ML SOLN COMPARISON:  None. FINDINGS: Cardiovascular: Contrast injection is sufficient to demonstrate satisfactory opacification of the pulmonary arteries to the segmental level. The right middle lobe pulmonary artery is occluded at its origin. There is a small amount of thrombus extending into the right lower lobar artery. No left-sided filling defect. The size of the main pulmonary artery is markedly enlarged, measuring 4.6 cm. Moderate cardiomegaly. The right ventricle is mildly enlarged relative to the left with a ratio 1.3. The course and caliber of the aorta are normal. There is no atherosclerotic calcification. Opacification decreased due to pulmonary arterial phase contrast bolus timing. Mediastinum/Nodes: No mediastinal, hilar or axillary lymphadenopathy. Normal visualized thyroid. Thoracic esophageal course is normal. Lungs/Pleura: Mild multifocal opacity in the right lower lobe. No pleural effusion or pneumothorax. Airways are patent. No pulmonary infarct. Upper Abdomen: Contrast bolus timing is not optimized for evaluation of the abdominal organs. Cholelithiasis. Musculoskeletal: No chest wall abnormality. No bony spinal canal stenosis. Review of the MIP images confirms the above findings. IMPRESSION: 1. Occlusion of the right middle lobe pulmonary artery at its origin, with a small amount of thrombus extending into the right lower lobar artery. 2. Markedly enlarged main pulmonary artery, consistent with pulmonary hypertension. 3. RV/LV ratio of 1.3 may indicate a  degree of right heart strain. This may be due to underlying chronic pulmonary hypertension. 4. Mild multifocal opacity in the right lower lobe may indicate aspiration or pneumonia. Critical Value/emergent results were called by telephone at the time of interpretation on 12/20/2019 at 1:18 am to provider Dr. Manson Passey, who verbally acknowledged these results. Electronically Signed   By: Deatra Robinson M.D.   On: 12/20/2019 01:19   DG Chest Port 1 View  Result Date: 12/22/2019 CLINICAL DATA:  Shortness of breath and decreased oxygen saturation today. EXAM: PORTABLE CHEST 1 VIEW COMPARISON:  PA and lateral chest 12/19/2019 and 03/12/2019. CT chest 12/20/2019. FINDINGS: There is cardiomegaly and mild interstitial edema. No consolidative process, pneumothorax or effusion. No acute or focal bony abnormality. IMPRESSION: Cardiomegaly and mild interstitial pulmonary edema. Electronically Signed   By: Drusilla Kanner M.D.   On: 12/22/2019 12:16   VAS [place] LOWER EXTREMITY VENOUS (DVT)  Result Date: 11/24/2019  Lower Venous DVTStudy  Risk Factors: DVT Right lower extremity. Comparison Study: 11/02/2019 Performing Technologist: Reece Agar RT (R)(VS)  Examination Guidelines: A complete evaluation includes B-mode imaging, spectral Doppler, color Doppler, and power Doppler as needed of all accessible portions of each vessel. Bilateral testing is considered an integral part of a complete examination. Limited examinations for reoccurring indications may be performed as noted. The reflux portion of the exam is performed with the patient in reverse Trendelenburg.  +---------+---------------+---------+-----------+----------+-------------------+ RIGHT    CompressibilityPhasicitySpontaneityPropertiesThrombus Aging      +---------+---------------+---------+-----------+----------+-------------------+ CFV      None                                                              +---------+---------------+---------+-----------+----------+-------------------+ SFJ      None                                                             +---------+---------------+---------+-----------+----------+-------------------+ FV Prox  None                                                             +---------+---------------+---------+-----------+----------+-------------------+ FV Mid   None                                                             +---------+---------------+---------+-----------+----------+-------------------+ FV DistalPartial                                                          +---------+---------------+---------+-----------+----------+-------------------+ PFV      Full                                                             +---------+---------------+---------+-----------+----------+-------------------+ POP      Partial                                                          +---------+---------------+---------+-----------+----------+-------------------+ PTV  Not visualized well +---------+---------------+---------+-----------+----------+-------------------+ GSV      None                                                             +---------+---------------+---------+-----------+----------+-------------------+ SSV      Full                                                             +---------+---------------+---------+-----------+----------+-------------------+  Summary: RIGHT: - Findings appear essentially unchanged compared to previous examination. - Occlusive thrombus is visualized in the CFV, SFJ, and proximal SFV. Partial compression in the mid SFV to popliteal vein. Posterior tibial vein not visualized well due to leg swelling. The right GSV appears non compressable from the SFJ to distal and appears to have partial compression at the level of the knee.   *See table(s) above for measurements and observations. Electronically signed by Festus Barren MD on 11/24/2019 at 3:07:36 PM.    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   COPD with chronic bronchitic phenotype     -Agree with Solu-Medrol 40 every 8      -Would favor using Xopenex due to atrial fibrillation    Tobacco smoking    -Counseling provided for smoking cessation   Thank you for allowing me to participate in the care of this patient.   Patient/Family are satisfied with care plan and all questions have been answered.  This document was prepared using Dragon voice recognition software and may include unintentional dictation errors.     Vida Rigger, M.D.  Division of Pulmonary & Critical Care Medicine  [facility]

## 2019-12-22 NOTE — ED Notes (Signed)
Patient is argumentative about eating while on BiPAP. This RN has educated patient of the dangers of eating while on BiPAP, as there is a high risk for aspiration. Informed patient that respiratory therapy has been contacted to place him on nasal cannula so that he can eat. He says this will be more aggravating. Will try this and then if still having issues, will reach out to on-call provider

## 2019-12-22 NOTE — ED Notes (Signed)
Messaged NP regarding patient's HR and possible transition to PO cardizem. NP will review chart

## 2019-12-22 NOTE — ED Notes (Signed)
Patient calling out asking for pain medicine for leg. MD aware. See new orders.

## 2019-12-22 NOTE — ED Notes (Signed)
Patient transitioned to high flow nasal cannula by respiratory therapy

## 2019-12-22 NOTE — ED Notes (Signed)
Patient OK to continue on high flow nasal cannula at this time, NAD. Wife updated via patient's phone

## 2019-12-22 NOTE — ED Notes (Signed)
Patient requesting to come off BiPAP, respiratory therapy called to see if they can transition him to high flow nasal cannula

## 2019-12-22 NOTE — Progress Notes (Signed)
Established Patient Office Visit  Subjective:  Patient ID: Matthew [name], male    DOB: 28-Aug-1971  Age: 49 y.o. MRN: [id number]  CC:  Chief Complaint  Patient presents with  . Cough  . Shortness of Breath    HPI Matthew [name] presents for c/o difficulty breathing that has been going on for 2 weeks. He went to the ED on 12/19/2019 for fever, was treated with antibiotics for possible sepsis, but he left AMA. He presents today, c/o worsening shortness of breath, stating that he's dying and needs to talk to his Lawyer because he requires oxygen 24 hours a day. He has a history of DVT and he states that he's compliant with taking Rivaroxaban as prescribed. His latest oxygen saturation was 67% and was tachypnic. EMS was called.  Past Medical History:  Diagnosis Date  . Arthritis   . Hx of blood clots   . Sleep apnea    patient states not anymore    Past Surgical History:  Procedure Laterality Date  . IVC FILTER INSERTION Left 11/24/2018   Procedure: IVC FILTER INSERTION;  Surgeon: Annice Needy, MD;  Location: [facility];  Service: Cardiovascular;  Laterality: Left;  . IVC FILTER INSERTION Right 02/18/2019   Procedure: IVC FILTER INSERTION WITH RIGHT LOWER EXTREMITY VENOUS LYSIS;  Surgeon: Renford Dills, MD;  Location: [facility];  Service: Cardiovascular;  Laterality: Right;  . IVC FILTER REMOVAL N/A 12/01/2018   Procedure: IVC FILTER REMOVAL;  Surgeon: Annice Needy, MD;  Location: [facility];  Service: Cardiovascular;  Laterality: N/A;  . NO PAST SURGERIES    . PERIPHERAL VASCULAR THROMBECTOMY Right 10/30/2019   Procedure: PERIPHERAL VASCULAR THROMBECTOMY / THROMBOLYSIS;  Surgeon: Annice Needy, MD;  Location: [facility];  Service: Cardiovascular;  Laterality: Right;  . PERIPHERAL VASCULAR THROMBECTOMY Right 11/05/2019   Procedure: PERIPHERAL VASCULAR THROMBECTOMY / THROMBOLYSIS;  Surgeon: Annice Needy, MD;  Location: [facility];   Service: Cardiovascular;  Laterality: Right;    No family history on file.  Social History   Socioeconomic History  . Marital status: Married    Spouse name: Elease Hashimoto  . Number of children: 4  . Years of education: Not on file  . Highest education level: Not on file  Occupational History  . Not on file  Tobacco Use  . Smoking status: Current Every Day Smoker    Packs/day: 0.50    Types: Cigarettes  . Smokeless tobacco: Current User  Substance and Sexual Activity  . Alcohol use: Yes    Comment: weekends says every third weekend a 12 pack  . Drug use: Never  . Sexual activity: Yes  Other Topics Concern  . Not on file  Social History Narrative  . Not on file   Social Determinants of Health   Financial Resource Strain:   . Difficulty of Paying Living Expenses:   Food Insecurity:   . Worried About Programme researcher, broadcasting/film/video in the Last Year:   . Barista in the Last Year:   Transportation Needs:   . Freight forwarder (Medical):   Marland Kitchen Lack of Transportation (Non-Medical):   Physical Activity:   . Days of Exercise per Week:   . Minutes of Exercise per Session:   Stress:   . Feeling of Stress :   Social Connections:   . Frequency of Communication with Friends and Family:   . Frequency of Social Gatherings with Friends and  Family:   . Attends Religious Services:   . Active Member of Clubs or Organizations:   . Attends Banker Meetings:   Marland Kitchen Marital Status:   Intimate Partner Violence:   . Fear of Current or Ex-Partner:   . Emotionally Abused:   Marland Kitchen Physically Abused:   . Sexually Abused:     Outpatient Medications Prior to Visit  Medication Sig Dispense Refill  . albuterol (VENTOLIN HFA) 108 (90 Base) MCG/ACT inhaler Inhale 2 puffs into the lungs every 6 (six) hours as needed for wheezing or shortness of breath. 18 g 0  . Rivaroxaban 15 & 20 MG TBPK Follow package directions: Take one 15mg  tablet by mouth twice a day. On day 22, switch to one 20mg   tablet once a day. Take with food. (Patient taking differently: Take 20 mg by mouth daily. Follow package directions: Take one 15mg  tablet by mouth twice a day. On day 22, switch to one 20mg  tablet once a day. Take with food.) 51 each 0  . nicotine (NICODERM CQ - DOSED IN MG/24 HOURS) 14 mg/24hr patch One 14mg  patch chest wall daily (okay to substitute generic) (Patient not taking: Reported on 11/23/2019) 28 patch 0  . silver sulfADIAZINE (SILVADENE) 1 % cream Apply to affected area daily 50 g 1   No facility-administered medications prior to visit.    Allergies  Allergen Reactions  . Amoxicillin Rash    ROS Review of Systems  Constitutional: Negative.   Respiratory: Positive for shortness of breath and wheezing.   Cardiovascular: Positive for leg swelling.  Neurological: Negative.       Objective:    Physical Exam  Constitutional: He is oriented to person, place, and time. He appears well-developed.  Cardiovascular: Normal rate and regular rhythm.  Pulmonary/Chest: He has wheezes in the right upper field, the right middle field, the right lower field, the left upper field, the left middle field and the left lower field.  Musculoskeletal:        General: Edema (+ 2 bilateral lower extremity.) present.  Neurological: He is alert and oriented to person, place, and time.  Psychiatric: He has a normal mood and affect. His behavior is normal. Judgment and thought content normal.    BP 90/64 (BP Location: Left Arm, Patient Position: Sitting)   Pulse 62   Temp (!) 97 F (36.1 C)   Resp (!) 23   Ht 5\' 7"  (1.702 m)   Wt 283 lb (128.4 kg)   SpO2 (!) 67%   BMI 44.32 kg/m  Wt Readings from Last 3 Encounters:  12/22/19 283 lb (128.4 kg)  12/21/19 280 lb (127 kg)  12/19/19 288 lb 12.8 oz (131 kg)     Health Maintenance Due  Topic Date Due  . COVID-19 Vaccine (1) Never done    There are no preventive care reminders to display for this patient.  Lab Results  Component Value  Date   TSH 1.636 03/12/2019   Lab Results  Component Value Date   WBC 7.4 12/20/2019   HGB 16.7 12/20/2019   HCT 50.0 12/20/2019   MCV 101.2 (H) 12/20/2019   PLT 152 12/20/2019   Lab Results  Component Value Date   NA 137 12/20/2019   K 4.6 12/20/2019   CO2 34 (H) 12/20/2019   GLUCOSE 113 (H) 12/20/2019   BUN 7 12/20/2019   CREATININE 0.92 12/20/2019   BILITOT 0.7 12/19/2019   ALKPHOS 149 (H) 12/19/2019   AST 19 12/19/2019   ALT  16 12/19/2019   PROT 8.0 12/19/2019   ALBUMIN 3.4 (L) 12/19/2019   CALCIUM 8.6 (L) 12/20/2019   ANIONGAP 6 12/20/2019   Lab Results  Component Value Date   CHOL 155 09/10/2018   Lab Results  Component Value Date   HDL 38 (L) 09/10/2018   Lab Results  Component Value Date   LDLCALC 84 09/10/2018   Lab Results  Component Value Date   TRIG 163 (H) 09/10/2018   Lab Results  Component Value Date   CHOLHDL 4.1 09/10/2018   Lab Results  Component Value Date   HGBA1C 5.9 (H) 09/10/2018      Assessment & Plan:  . 1. Shortness of breath -Possible PE, Covid 19. EMS was called and he was taking to the hospital on a stretcher.     Follow-up: Return in about 15 days (around 01/06/2020), or if symptoms worsen or fail to improve.    Lithzy Bernard Jerold Coombe, NP

## 2019-12-22 NOTE — Consult Note (Signed)
ANTICOAGULATION CONSULT NOTE  Pharmacy Consult for Heparin Indication: pulmonary embolus  Allergies  Allergen Reactions  . Amoxicillin Rash    Patient Measurements: Height: 5\' 7"  (170.2 cm) Weight: 128.4 kg (283 lb) IBW/kg (Calculated) : 66.1 Heparin Dosing Weight: 93.6 kg  Vital Signs: Temp: 97.8 F (36.6 C) (04/20 1132) Temp Source: Axillary (04/20 1132) BP: 93/60 (04/20 2115) Pulse Rate: 67 (04/20 2115)  Labs: Recent Labs    12/20/19 0432 12/22/19 1136 12/22/19 1405 12/22/19 2104  HGB 16.7 16.0  --   --   HCT 50.0 48.5  --   --   PLT 152 134*  --   --   APTT  --  39*  --  100*  LABPROT  --  22.9*  --   --   INR  --  2.0*  --   --   HEPARINUNFRC  --   --  >3.60*  --   CREATININE 0.92 0.93  --   --   TROPONINIHS  --  56* 59*  --     Estimated Creatinine Clearance: 123.7 mL/min (by C-G formula based on SCr of 0.93 mg/dL).   Medical History: Past Medical History:  Diagnosis Date  . Arthritis   . Hx of blood clots   . Sleep apnea    patient states not anymore    Medications:  (Not in a hospital admission)  Scheduled:  . albuterol      . azithromycin  500 mg Oral Daily  . [START ON 12/23/2019] diltiazem  120 mg Oral Daily  . methylPREDNISolone (SOLU-MEDROL) injection  40 mg Intravenous Q6H  . nicotine  14 mg Transdermal Daily  . sodium chloride flush  3 mL Intravenous Q12H   Infusions:  . sodium chloride    . [START ON 12/23/2019] sodium chloride    . diltiazem (CARDIZEM) infusion 15 mg/hr (12/22/19 1756)  . heparin 1,500 Units/hr (12/22/19 1415)   PRN:  Anti-infectives (From admission, onward)   Start     Dose/Rate Route Frequency Ordered Stop   12/22/19 1800  azithromycin (ZITHROMAX) tablet 500 mg     500 mg Oral Daily 12/22/19 1700        Assessment: Pharmacy consulted for heparin for PE. Pt was started on rivaroxaban outpatient and per med rec patient took it yesterday. Will order baseline heparin level, APTT and INR.   Goal of Therapy:   Heparin level 0.3-0.7 units/ml once APTT and Heparin level correlate.  aPTT 66-102 seconds Monitor platelets by anticoagulation protocol: Yes   Plan:  Give 5000 units bolus x 1 Start heparin infusion at 1500 units/hr Check aPTT level in 6 hours and daily while on heparin Continue to monitor H&H and platelets  4/20:  APTT @ 2104 = 100  Will continue pt on current rate and recheck aPTT on 4/21 @ 0300.   Symphonie Schneiderman D, PharmD, BCPS 12/22/2019,9:45 PM

## 2019-12-22 NOTE — Consult Note (Signed)
ANTICOAGULATION CONSULT NOTE  Pharmacy Consult for Heparin Indication: pulmonary embolus  Allergies  Allergen Reactions  . Amoxicillin Rash    Patient Measurements: Height: 5\' 7"  (170.2 cm) Weight: 128.4 kg (283 lb) IBW/kg (Calculated) : 66.1 Heparin Dosing Weight: 93.6 kg  Vital Signs: Temp: 97.8 F (36.6 C) (04/20 1132) Temp Source: Axillary (04/20 1132) BP: 114/80 (04/20 1300) Pulse Rate: 116 (04/20 1300)  Labs: Recent Labs    12/19/19 1935 12/19/19 1935 12/20/19 0432 12/22/19 1136  HGB 17.1*   < > 16.7 16.0  HCT 52.3*  --  50.0 48.5  PLT 164  --  152 134*  LABPROT 13.9  --   --   --   INR 1.1  --   --   --   CREATININE 0.84  --  0.92 0.93  TROPONINIHS  --   --   --  56*   < > = values in this interval not displayed.    Estimated Creatinine Clearance: 123.7 mL/min (by C-G formula based on SCr of 0.93 mg/dL).   Medical History: Past Medical History:  Diagnosis Date  . Arthritis   . Hx of blood clots   . Sleep apnea    patient states not anymore    Medications:  (Not in a hospital admission)  Scheduled:  . diltiazem  120 mg Oral Once   Infusions:   PRN:  Anti-infectives (From admission, onward)   None      Assessment: Pharmacy consulted for heparin for PE. Pt was started on rivaroxaban outpatient and per med rec patient took it yesterday. Will order baseline heparin level, APTT and INR.   Goal of Therapy:  Heparin level 0.3-0.7 units/ml once APTT and Heparin level correlate.  aPTT 66-102 seconds Monitor platelets by anticoagulation protocol: Yes   Plan:  Give 5000 units bolus x 1 Start heparin infusion at 1500 units/hr Check aPTT level in 6 hours and daily while on heparin Continue to monitor H&H and platelets  12/24/19, PharmD, BCPS 12/22/2019,1:19 PM

## 2019-12-22 NOTE — ED Triage Notes (Signed)
Pt arrival via [facility] from home due to difficulty breathing. [facility] obtained an O2 sat in the 40's and called EMS. EMS arrived and got an O2 sat of 83% on RA. EMS gave him 4 duonebs with 2 extra albuterals and 125 solumedrol.   Pt left AMA a few days ago from a room upstairs due to a blood clot in his lungs. Pt also has hx of DVT's and blood clots in lungs.  Dr. Mayford Knife at bedside.

## 2019-12-23 ENCOUNTER — Telehealth: Payer: Self-pay | Admitting: Gastroenterology

## 2019-12-23 ENCOUNTER — Other Ambulatory Visit: Payer: Self-pay

## 2019-12-23 LAB — ALPHA-1-ANTITRYPSIN: A-1 Antitrypsin: 151 mg/dL (ref 101–187)

## 2019-12-23 LAB — MITOCHONDRIAL/SMOOTH MUSCLE AB PNL
Mitochondrial Ab: <20 Units (ref 0.0–20.0)
Smooth Muscle Ab: 15 Units (ref 0–19)

## 2019-12-23 LAB — BASIC METABOLIC PANEL
Anion gap: 8 (ref 5–15)
BUN: 16 mg/dL (ref 6–20)
CO2: 32 mmol/L (ref 22–32)
Calcium: 8.3 mg/dL — ABNORMAL LOW (ref 8.9–10.3)
Chloride: 96 mmol/L — ABNORMAL LOW (ref 98–111)
Creatinine, Ser: 0.86 mg/dL (ref 0.61–1.24)
GFR calc Af Amer: >60 mL/min (ref >60)
GFR calc non Af Amer: >60 mL/min (ref >60)
Glucose, Bld: 185 mg/dL — ABNORMAL HIGH (ref 70–99)
Potassium: 4.8 mmol/L (ref 3.5–5.1)
Sodium: 136 mmol/L (ref 135–145)

## 2019-12-23 LAB — HEPATITIS B SURFACE ANTIGEN: Hepatitis B Surface Ag: NEGATIVE

## 2019-12-23 LAB — CELIAC DISEASE AB SCREEN W/RFX
Antigliadin Abs, IgA: 7 units (ref 0–19)
Transglutaminase IgA: <2 U/mL (ref 0–3)

## 2019-12-23 LAB — IRON,TIBC AND FERRITIN PANEL
Ferritin: 111 ng/mL (ref 30–400)
Iron Saturation: 43 % (ref 15–55)
Iron: 125 ug/dL (ref 38–169)
Total Iron Binding Capacity: 288 ug/dL (ref 250–450)
UIBC: 163 ug/dL (ref 111–343)

## 2019-12-23 LAB — HEPATITIS A ANTIBODY, TOTAL: hep A Total Ab: NEGATIVE

## 2019-12-23 LAB — CBC
HCT: 48 % (ref 39.0–52.0)
Hemoglobin: 15.9 g/dL (ref 13.0–17.0)
MCH: 33.3 pg (ref 26.0–34.0)
MCHC: 33.1 g/dL (ref 30.0–36.0)
MCV: 100.6 fL — ABNORMAL HIGH (ref 80.0–100.0)
Platelets: 146 10*3/uL — ABNORMAL LOW (ref 150–400)
RBC: 4.77 MIL/uL (ref 4.22–5.81)
RDW: 15.1 % (ref 11.5–15.5)
WBC: 6.2 10*3/uL (ref 4.0–10.5)
nRBC: 0.3 % — ABNORMAL HIGH (ref 0.0–0.2)

## 2019-12-23 LAB — HEPATITIS B E ANTIGEN: Hep B E Ag: NEGATIVE

## 2019-12-23 LAB — IMMUNOGLOBULINS A/E/G/M, SERUM
IgA/Immunoglobulin A, Serum: 500 mg/dL — ABNORMAL HIGH (ref 90–386)
IgE (Immunoglobulin E), Serum: 592 IU/mL — ABNORMAL HIGH (ref 6–495)
IgG (Immunoglobin G), Serum: 1895 mg/dL — ABNORMAL HIGH (ref 603–1613)
IgM (Immunoglobulin M), Srm: 83 mg/dL (ref 20–172)

## 2019-12-23 LAB — ANA: Anti Nuclear Antibody (ANA): NEGATIVE

## 2019-12-23 LAB — HEPATITIS B SURFACE ANTIBODY,QUALITATIVE: Hep B Surface Ab, Qual: NONREACTIVE

## 2019-12-23 LAB — TROPONIN I (HIGH SENSITIVITY)
Troponin I (High Sensitivity): 44 ng/L — ABNORMAL HIGH (ref <18)
Troponin I (High Sensitivity): 41 ng/L — ABNORMAL HIGH (ref <18)

## 2019-12-23 LAB — HIV ANTIBODY (ROUTINE TESTING W REFLEX): HIV Screen 4th Generation wRfx: NONREACTIVE

## 2019-12-23 LAB — ANTI-MICROSOMAL ANTIBODY LIVER / KIDNEY: LKM1 Ab: 1.2 Units (ref 0.0–20.0)

## 2019-12-23 LAB — APTT: aPTT: 93 seconds — ABNORMAL HIGH (ref 24–36)

## 2019-12-23 LAB — HEPATITIS B E ANTIBODY: Hep B E Ab: NEGATIVE

## 2019-12-23 LAB — CERULOPLASMIN: Ceruloplasmin: 38.8 mg/dL — ABNORMAL HIGH (ref 16.0–31.0)

## 2019-12-23 LAB — CK: Total CK: 39 U/L — ABNORMAL LOW (ref 49–439)

## 2019-12-23 MED ORDER — AMIODARONE HCL 200 MG PO TABS
200.0000 mg | ORAL_TABLET | Freq: Two times a day (BID) | ORAL | Status: DC
  Administered 2019-12-23 – 2019-12-26 (×7): 200 mg via ORAL
  Filled 2019-12-23 (×8): qty 1

## 2019-12-23 MED ORDER — DILTIAZEM HCL ER COATED BEADS 120 MG PO CP24
240.0000 mg | ORAL_CAPSULE | Freq: Every day | ORAL | Status: DC
  Administered 2019-12-23 – 2019-12-25 (×3): 240 mg via ORAL
  Filled 2019-12-23: qty 2
  Filled 2019-12-23: qty 1
  Filled 2019-12-23: qty 2

## 2019-12-23 NOTE — ED Notes (Signed)
Patient is resting at this time.

## 2019-12-23 NOTE — ED Notes (Signed)
Patient keeps removing HFNC from face.

## 2019-12-23 NOTE — ED Notes (Signed)
Patient was found with O2 off, educated on the importance of leaving nasal cannula in place. Patient is alert and oriented

## 2019-12-23 NOTE — Telephone Encounter (Signed)
Elease Hashimoto called to cancel u/s for Friday because he is in the hospital with a blood clot on his lungs. Please cancel u/s for patient.

## 2019-12-23 NOTE — Consult Note (Signed)
ANTICOAGULATION CONSULT NOTE  Pharmacy Consult for Heparin Indication: pulmonary embolus  Allergies  Allergen Reactions  . Amoxicillin Rash    Patient Measurements: Height: 5\' 7"  (170.2 cm) Weight: 128.4 kg (283 lb) IBW/kg (Calculated) : 66.1 Heparin Dosing Weight: 93.6 kg  Vital Signs: Temp: 98.5 F (36.9 C) (04/21 0045) Temp Source: Oral (04/21 0045) BP: 111/86 (04/21 0400) Pulse Rate: 88 (04/21 0400)  Labs: Recent Labs    12/22/19 1136 12/22/19 1405 12/22/19 2104 12/23/19 0206 12/23/19 0304 12/23/19 0414  HGB 16.0  --   --   --   --  15.9  HCT 48.5  --   --   --   --  48.0  PLT 134*  --   --   --   --  146*  APTT 39*  --  100*  --  93*  --   LABPROT 22.9*  --   --   --   --   --   INR 2.0*  --   --   --   --   --   HEPARINUNFRC  --  >3.60*  --   --   --   --   CREATININE 0.93  --   --   --   --   --   TROPONINIHS 56* 59*  --  44*  --   --     Estimated Creatinine Clearance: 123.7 mL/min (by C-G formula based on SCr of 0.93 mg/dL).   Medical History: Past Medical History:  Diagnosis Date  . Arthritis   . Hx of blood clots   . Sleep apnea    patient states not anymore    Medications:  (Not in a hospital admission)  Scheduled:  . azithromycin  500 mg Oral Daily  . diltiazem  120 mg Oral Daily  . methylPREDNISolone (SOLU-MEDROL) injection  40 mg Intravenous Q6H  . nicotine  14 mg Transdermal Daily  . sodium chloride flush  3 mL Intravenous Q12H   Infusions:  . sodium chloride    . sodium chloride    . diltiazem (CARDIZEM) infusion 2.5 mg/hr (12/22/19 2334)  . heparin 1,500 Units/hr (12/22/19 2310)   PRN:  Anti-infectives (From admission, onward)   Start     Dose/Rate Route Frequency Ordered Stop   12/22/19 1800  azithromycin (ZITHROMAX) tablet 500 mg     500 mg Oral Daily 12/22/19 1700        Assessment: Pharmacy consulted for heparin for PE. Pt was started on rivaroxaban outpatient and per med rec patient took it yesterday. Will order  baseline heparin level, APTT and INR.   Goal of Therapy:  Heparin level 0.3-0.7 units/ml once APTT and Heparin level correlate.  aPTT 66-102 seconds Monitor platelets by anticoagulation protocol: Yes   Plan:  4/21 @ 0300 aPTT 93 seconds therapeutic. Will continue current rate at 1500 units/hr and will recheck aPTT at 04/22 w/ am labs. CBC stable will continue to monitor.  5/22, PharmD, BCPS 12/23/2019,4:32 AM

## 2019-12-23 NOTE — Progress Notes (Signed)
[facility] Daily Progress Note    Subjective: Patient more lethargic today. No issues overnight.   Objective: Vitals:   12/23/19 1230 12/23/19 1301 12/23/19 1522 12/23/19 1525  BP: 106/70 108/76    Pulse: (!) 53 80 69 70  Resp: 15 17 (!) 25 19  Temp:      TempSrc:      SpO2: 98% 99% 96% 97%  Weight:      Height:        Intake/Output Summary (Last 24 hours) at 12/23/2019 1615 Last data filed at 12/23/2019 1149 Gross per 24 hour  Intake 115.98 ml  Output 550 ml  Net -434.02 ml   Physical Exam: A&Ox2, NAD CV: RRR Pulmonary: Decreased bilaterally Abdomen: Soft, Nontender, Nondistended Vascular: Warm distally to toes   Laboratory: CBC    Component Value Date/Time   WBC 6.2 12/23/2019 0414   HGB 15.9 12/23/2019 0414   HGB 17.8 (H) 09/10/2018 0930   HCT 48.0 12/23/2019 0414   HCT 51.3 (H) 09/10/2018 0930   PLT 146 (L) 12/23/2019 0414   PLT 233 09/10/2018 0930   BMET    Component Value Date/Time   NA 136 12/23/2019 0414   NA 143 09/10/2018 0930   K 4.8 12/23/2019 0414   CL 96 (L) 12/23/2019 0414   CO2 32 12/23/2019 0414   GLUCOSE 185 (H) 12/23/2019 0414   BUN 16 12/23/2019 0414   BUN 8 09/10/2018 0930   CREATININE 0.86 12/23/2019 0414   CALCIUM 8.3 (L) 12/23/2019 0414   GFRNONAA >60 12/23/2019 0414   GFRAA >60 12/23/2019 0414   Assessment/Planning: The patient is a 49 year old male with a history of recurrent DVT s/p  IVC filter insertion who presented to the [facility] emergency department two days ago and found to have PE left AGAINST MEDICAL ADVICE return back to the ED yesterday started on Cardizem and heparin drip  PE:  Improvement in vitals today. Still on high flow nasal cannula however O2 sat is improved. No indication for endovascular intervention at this time.   Discussed with Dr. Wallis Mart Rollie Hynek PA-C 12/23/2019 4:15 PM

## 2019-12-23 NOTE — ED Notes (Signed)
Patient does not wish to be repositioned at this time

## 2019-12-23 NOTE — ED Notes (Signed)
Patient linen and gown have been changed and patient has been repositioned.

## 2019-12-23 NOTE — Plan of Care (Signed)
  Problem: Education: Goal: Knowledge of General Education information will improve Description: Including pain rating scale, medication(s)/side effects and non-pharmacologic comfort measures Outcome: Progressing Note: Patient profile completed. No complaints of pain. Patient is eating. NO dyspnea noted.

## 2019-12-23 NOTE — ED Notes (Signed)
Troponin sent at 0208

## 2019-12-23 NOTE — ED Notes (Signed)
Patient repositioned for comfort. NAD, asks when bed will be available. Patient felt that O2 was too strong, turned down

## 2019-12-23 NOTE — Consult Note (Signed)
Matthew [name] is a 49 y.o. 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  2 days prior to this admission patient had a CTA which revealed a pulmonary embolus who then signed out AMA.  Patient has a history of noncompliance with his anticoagulation.  On presentation to the emergency department patient had a mild elevation in his troponin which is most likely demand ischemia related to pulmonary embolus.  Patient was also found in A. fib RVR and was started on a diltiazem drip.  There was also concern of the patient having a COPD exacerbation and was started on steroids.  Vascular surgery has determined that the patient will not require intervention for his pulmonary embousi and therefore will just continue on a heparin drip.   Review of Systems: Patient denies shortness of breath but continues to require oxygen.  Patient also denies chest pain.   Past Medical History:  Diagnosis Date  . Arthritis   . Hx of blood clots   . Sleep apnea    patient states not anymore    (Not in a hospital admission)    . azithromycin  500 mg Oral Daily  . diltiazem  240 mg Oral Daily  . methylPREDNISolone (SOLU-MEDROL) injection  40 mg Intravenous Q6H  . nicotine  14 mg Transdermal Daily  . sodium chloride flush  3 mL Intravenous Q12H    Infusions: . sodium chloride    . sodium chloride 75 mL/hr at 12/23/19 0717  . diltiazem (CARDIZEM) infusion 2.5 mg/hr (12/23/19 0722)  . heparin 1,500 Units/hr (12/22/19 2310)    Allergies  Allergen Reactions  . Amoxicillin Rash    Social History   Socioeconomic History  . Marital status: Married    Spouse name: Elease Hashimoto  . Number of children: 4  . Years of  education: Not on file  . Highest education level: Not on file  Occupational History  . Not on file  Tobacco Use  . Smoking status: Current Every Day Smoker    Packs/day: 0.50    Types: Cigarettes  . Smokeless tobacco: Current User  Substance and Sexual Activity  . Alcohol use: Yes    Comment: weekends says every third weekend a 12 pack  . Drug use: Never  . Sexual activity: Yes  Other Topics Concern  . Not on file  Social History Narrative  . Not on file   Social Determinants of Health   Financial Resource Strain:   . Difficulty of Paying Living Expenses:   Food Insecurity:   . Worried About Programme researcher, broadcasting/film/video in the Last Year:   . Barista in the Last Year:   Transportation Needs:   . Freight forwarder (Medical):   Marland Kitchen Lack of Transportation (Non-Medical):   Physical Activity:   . Days of Exercise per Week:   . Minutes of Exercise per Session:   Stress:   . Feeling of Stress :   Social Connections:   . Frequency of Communication with Friends and Family:   . Frequency of Social Gatherings with Friends and Family:   . Attends Religious Services:   . Active Member of Clubs or Organizations:   . Attends Banker Meetings:   Marland Kitchen Marital Status:  Intimate Partner Violence:   . Fear of Current or Ex-Partner:   . Emotionally Abused:   Marland Kitchen Physically Abused:   . Sexually Abused:     History reviewed. No pertinent family history.  PHYSICAL EXAM: Vitals:   12/23/19 0900 12/23/19 0930  BP: 101/71 95/79  Pulse: 64 (!) 28  Resp: 20 19  Temp:    SpO2: 94% 98%     Intake/Output Summary (Last 24 hours) at 12/23/2019 1001 Last data filed at 12/23/2019 0414 Gross per 24 hour  Intake --  Output 650 ml  Net -650 ml    General:  Well appearing. No respiratory difficulty HEENT: normal Neck: supple. no JVD. Carotids 2+ bilat; no bruits. No lymphadenopathy or thryomegaly appreciated. Cor: PMI nondisplaced. Atrial Fibrillation. No rubs, gallops or  murmurs. Lungs: clear Abdomen: soft, nontender, nondistended. No hepatosplenomegaly. No bruits or masses. Good bowel sounds. Extremities: no cyanosis, clubbing, rash, edema Neuro: Drowsy & oriented x 3, cranial nerves grossly intact. moves all 4 extremities w/o difficulty. Affect pleasant.  ECG: Atrial fibrillation with rapid ventricular rate.  140/BPM  Results for orders placed or performed during the hospital encounter of 12/22/19 (from the past 24 hour(s))  CBC with Differential     Status: Abnormal   Collection Time: 12/22/19 11:36 AM  Result Value Ref Range   WBC 7.8 4.0 - 10.5 K/uL   RBC 4.84 4.22 - 5.81 MIL/uL   Hemoglobin 16.0 13.0 - 17.0 g/dL   HCT 48.5 39.0 - 52.0 %   MCV 100.2 (H) 80.0 - 100.0 fL   MCH 33.1 26.0 - 34.0 pg   MCHC 33.0 30.0 - 36.0 g/dL   RDW 15.1 11.5 - 15.5 %   Platelets 134 (L) 150 - 400 K/uL   nRBC 0.4 (H) 0.0 - 0.2 %   Neutrophils Relative % 44 %   Neutro Abs 3.5 1.7 - 7.7 K/uL   Lymphocytes Relative 40 %   Lymphs Abs 3.1 0.7 - 4.0 K/uL   Monocytes Relative 13 %   Monocytes Absolute 1.0 0.1 - 1.0 K/uL   Eosinophils Relative 1 %   Eosinophils Absolute 0.0 0.0 - 0.5 K/uL   Basophils Relative 1 %   Basophils Absolute 0.1 0.0 - 0.1 K/uL   Immature Granulocytes 1 %   Abs Immature Granulocytes 0.04 0.00 - 0.07 K/uL  Basic metabolic panel     Status: Abnormal   Collection Time: 12/22/19 11:36 AM  Result Value Ref Range   Sodium 138 135 - 145 mmol/L   Potassium 4.2 3.5 - 5.1 mmol/L   Chloride 98 98 - 111 mmol/L   CO2 31 22 - 32 mmol/L   Glucose, Bld 122 (H) 70 - 99 mg/dL   BUN 12 6 - 20 mg/dL   Creatinine, Ser 0.93 0.61 - 1.24 mg/dL   Calcium 8.5 (L) 8.9 - 10.3 mg/dL   GFR calc non Af Amer >60 >60 mL/min   GFR calc Af Amer >60 >60 mL/min   Anion gap 9 5 - 15  Brain natriuretic peptide     Status: Abnormal   Collection Time: 12/22/19 11:36 AM  Result Value Ref Range   B Natriuretic Peptide 146.0 (H) 0.0 - 100.0 pg/mL  Respiratory Panel by RT  PCR (Flu A&B, Covid) - Nasopharyngeal Swab     Status: None   Collection Time: 12/22/19 11:36 AM   Specimen: Nasopharyngeal Swab  Result Value Ref Range   SARS Coronavirus 2 by RT PCR NEGATIVE NEGATIVE   Influenza  A by PCR NEGATIVE NEGATIVE   Influenza B by PCR NEGATIVE NEGATIVE  Troponin I (High Sensitivity)     Status: Abnormal   Collection Time: 12/22/19 11:36 AM  Result Value Ref Range   Troponin I (High Sensitivity) 56 (H) <18 ng/L  APTT     Status: Abnormal   Collection Time: 12/22/19 11:36 AM  Result Value Ref Range   aPTT 39 (H) 24 - 36 seconds  Protime-INR     Status: Abnormal   Collection Time: 12/22/19 11:36 AM  Result Value Ref Range   Prothrombin Time 22.9 (H) 11.4 - 15.2 seconds   INR 2.0 (H) 0.8 - 1.2  Resp Panel by RT PCR (RSV, Flu A&B, Covid) -     Status: Abnormal   Collection Time: 12/22/19 11:36 AM  Result Value Ref Range   SARS Coronavirus 2 by RT PCR NEGATIVE NEGATIVE   Influenza A by PCR NEGATIVE NEGATIVE   Influenza B by PCR NEGATIVE NEGATIVE   Respiratory Syncytial Virus by PCR POSITIVE (A) NEGATIVE  Troponin I (High Sensitivity)     Status: Abnormal   Collection Time: 12/22/19  2:05 PM  Result Value Ref Range   Troponin I (High Sensitivity) 59 (H) <18 ng/L  Heparin level (unfractionated)     Status: Abnormal   Collection Time: 12/22/19  2:05 PM  Result Value Ref Range   Heparin Unfractionated >3.60 (H) 0.30 - 0.70 IU/mL  APTT     Status: Abnormal   Collection Time: 12/22/19  9:04 PM  Result Value Ref Range   aPTT 100 (H) 24 - 36 seconds  Troponin I (High Sensitivity)     Status: Abnormal   Collection Time: 12/23/19  2:06 AM  Result Value Ref Range   Troponin I (High Sensitivity) 44 (H) <18 ng/L  APTT     Status: Abnormal   Collection Time: 12/23/19  3:04 AM  Result Value Ref Range   aPTT 93 (H) 24 - 36 seconds  CBC     Status: Abnormal   Collection Time: 12/23/19  4:14 AM  Result Value Ref Range   WBC 6.2 4.0 - 10.5 K/uL   RBC 4.77 4.22 -  5.81 MIL/uL   Hemoglobin 15.9 13.0 - 17.0 g/dL   HCT 02.7 25.3 - 66.4 %   MCV 100.6 (H) 80.0 - 100.0 fL   MCH 33.3 26.0 - 34.0 pg   MCHC 33.1 30.0 - 36.0 g/dL   RDW 40.3 47.4 - 25.9 %   Platelets 146 (L) 150 - 400 K/uL   nRBC 0.3 (H) 0.0 - 0.2 %  Basic metabolic panel     Status: Abnormal   Collection Time: 12/23/19  4:14 AM  Result Value Ref Range   Sodium 136 135 - 145 mmol/L   Potassium 4.8 3.5 - 5.1 mmol/L   Chloride 96 (L) 98 - 111 mmol/L   CO2 32 22 - 32 mmol/L   Glucose, Bld 185 (H) 70 - 99 mg/dL   BUN 16 6 - 20 mg/dL   Creatinine, Ser 5.63 0.61 - 1.24 mg/dL   Calcium 8.3 (L) 8.9 - 10.3 mg/dL   GFR calc non Af Amer >60 >60 mL/min   GFR calc Af Amer >60 >60 mL/min   Anion gap 8 5 - 15  Troponin I (High Sensitivity)     Status: Abnormal   Collection Time: 12/23/19  4:14 AM  Result Value Ref Range   Troponin I (High Sensitivity) 41 (H) <18 ng/L   DG  Chest Port 1 View  Result Date: 12/22/2019 CLINICAL DATA:  Shortness of breath and decreased oxygen saturation today. EXAM: PORTABLE CHEST 1 VIEW COMPARISON:  PA and lateral chest 12/19/2019 and 03/12/2019. CT chest 12/20/2019. FINDINGS: There is cardiomegaly and mild interstitial edema. No consolidative process, pneumothorax or effusion. No acute or focal bony abnormality. IMPRESSION: Cardiomegaly and mild interstitial pulmonary edema. Electronically Signed   By: Drusilla Kanner M.D.   On: 12/22/2019 12:16     ASSESSMENT AND PLAN: Patient presenting to the emergency department with shortness of breath after recently signing out AMA with a diagnosis of a pulmonary embolus.  Patient continues to be in atrial fibrillation but is now rate controlled in the 70s on a diltiazem infusion.  Plan to increase oral diltiazem dose and please titrate off the diltiazem infusion.  As patient remains in atrial fibrillation we will start the patient on amiodarone.  Will also follow-up on already ordered echocardiogram.  Patient does not appear to be  of any sort of respiratory distress but is still requiring 8 L nasal cannula.  Please continue heparin drip in regards to his pulmonary embolus with a plan to eventually transition back to his Xarelto dose. Will continue to follow.  Maryelizabeth Kaufmann NP-C

## 2019-12-23 NOTE — Progress Notes (Signed)
PROGRESS NOTE    Matthew [name]  QIH:[id number] DOB: 08-Nov-1970 DOA: 12/22/2019 PCP: Matthew Gala, NP      Assessment & Plan:   Principal Problem:   Pulmonary embolism (HCC) Active Problems:   COPD with acute exacerbation (HCC)   Class 3 severe obesity without serious comorbidity with body mass index (BMI) of 40.0 to 44.9 in adult [name])   Acute on chronic respiratory failure with hypoxia (HCC)   Atrial fibrillation with RVR (HCC)   Acute pulmonary embolism (HCC)   Non compliance w medication regimen   Acute pulmonary embolism: CT angiogram of the chest which was done on 12/20/18 and it showed occlusion of the right middle lobe pulmonary artery at its origin, with a small amount of thrombus extending into the right lower lobar artery. Markedly enlarged main pulmonary artery, consistent with pulmonary hypertension. CT scan findings were discussed with patient on 04/18/21and heparin drip was recommended but he declined and signed out AGAINST MEDICAL ADVICE stating that he would continue to take his Xarelto at home Patient admits to noncompliance with medication. Echo pending. Hx of IVC filter. Continue on IV heparin. No acute indication for surgical procedure as per vascular surg  Acute hypoxic respiratory failure: multifactorial related to acute COPD exacerbation as well as acute PE. Continue on supplemental oxygen and wean as tolerated   COPD with acute exacerbation: continue on bronchodilators & azithromycin. Continue on IV steroids. Encourage incentive spirometry   Atrial fibrillation: w/ RVR. PAF vs chronic.Continue on IV cardizem drip for rate control. Continue heparin as primary prophylaxis for an acute stroke  Morbid obesity: BMI 44.3. Complicates overall prognosis and care   DVT prophylaxis: IV heparin Code Status: full  Family Communication:  Disposition Plan: likely d/c home vs home health home     Consultants:   Vascular surgery    Procedures:     Antimicrobials: azithromycin    Subjective: Pt c/o shortness of breath   Objective: Vitals:   12/23/19 0700 12/23/19 0737 12/23/19 0800 12/23/19 0830  BP: 98/71 108/86 101/79 98/71  Pulse: 91 (!) 51 91 73  Resp: (!) 23 (!) 21 20 17   Temp:      TempSrc:      SpO2: 95% 96% 95% 96%  Weight:      Height:        Intake/Output Summary (Last 24 hours) at 12/23/2019 0849 Last data filed at 12/23/2019 0414 Gross per 24 hour  Intake -  Output 650 ml  Net -650 ml   Filed Weights   12/22/19 1128  Weight: 128.4 kg    Examination:  General exam: Appears calm and comfortable  Respiratory system: diminished breath sounds b/l.  Cardiovascular system: S1 & S2 +. No  rubs, gallops or clicks. B/l LE edema  Gastrointestinal system: Abdomen is obese, soft and nontender. Normal bowel sounds heard. Central nervous system: Alert and oriented. Moves all 4 extremities. Psychiatry: Judgement and insight appear normal. Mood & affect appropriate.     Data Reviewed: I have personally reviewed following labs and imaging studies  CBC: Recent Labs  Lab 12/19/19 1935 12/20/19 0432 12/22/19 1136 12/23/19 0414  WBC 7.5 7.4 7.8 6.2  NEUTROABS 4.6  --  3.5  --   HGB 17.1* 16.7 16.0 15.9  HCT 52.3* 50.0 48.5 48.0  MCV 101.9* 101.2* 100.2* 100.6*  PLT 164 152 134* 146*   Basic Metabolic Panel: Recent Labs  Lab 12/19/19 1935 12/20/19 0432 12/22/19 1136 12/23/19 0414  NA 137 137 138 136  K 4.7 4.6 4.2 4.8  CL 99 97* 98 96*  CO2 28 34* 31 32  GLUCOSE 107* 113* 122* 185*  BUN 7 7 12 16   CREATININE 0.84 0.92 0.93 0.86  CALCIUM 8.9 8.6* 8.5* 8.3*   GFR: Estimated Creatinine Clearance: 133.7 mL/min (by C-G formula based on SCr of 0.86 mg/dL). Liver Function Tests: Recent Labs  Lab 12/19/19 1935  AST 19  ALT 16  ALKPHOS 149*  BILITOT 0.7  PROT 8.0  ALBUMIN 3.4*   No results for input(s): LIPASE, AMYLASE in the last 168 hours. No results for input(s): AMMONIA in the last 168  hours. Coagulation Profile: Recent Labs  Lab 12/19/19 1935 12/22/19 1136  INR 1.1 2.0*   Cardiac Enzymes: Recent Labs  Lab 12/17/19 1427  CKTOTAL 39*   BNP (last 3 results) No results for input(s): PROBNP in the last 8760 hours. HbA1C: No results for input(s): HGBA1C in the last 72 hours. CBG: No results for input(s): GLUCAP in the last 168 hours. Lipid Profile: No results for input(s): CHOL, HDL, LDLCALC, TRIG, CHOLHDL, LDLDIRECT in the last 72 hours. Thyroid Function Tests: No results for input(s): TSH, T4TOTAL, FREET4, T3FREE, THYROIDAB in the last 72 hours. Anemia Panel: No results for input(s): VITAMINB12, FOLATE, FERRITIN, TIBC, IRON, RETICCTPCT in the last 72 hours. Sepsis Labs: Recent Labs  Lab 12/19/19 1935 12/20/19 0432  PROCALCITON  --  <0.10  LATICACIDVEN 1.4  --     Recent Results (from the past 240 hour(s))  Culture, blood (Routine x 2)     Status: None (Preliminary result)   Collection Time: 12/19/19  7:35 PM   Specimen: BLOOD  Result Value Ref Range Status   Specimen Description BLOOD RIGHT ANTECUBITAL  Final   Special Requests   Final    BOTTLES DRAWN AEROBIC AND ANAEROBIC Blood Culture results may not be optimal due to an excessive volume of blood received in culture bottles   Culture   Final    NO GROWTH 4 DAYS Performed at [facility], [address] [place], [place]    Report Status PENDING  Incomplete  Culture, blood (Routine x 2)     Status: None (Preliminary result)   Collection Time: 12/19/19  7:36 PM   Specimen: BLOOD  Result Value Ref Range Status   Specimen Description BLOOD BLOOD RIGHT HAND  Final   Special Requests   Final    BOTTLES DRAWN AEROBIC AND ANAEROBIC Blood Culture adequate volume   Culture   Final    NO GROWTH 4 DAYS Performed at [facility], [address], [place], [place]    Report Status PENDING  Incomplete  Respiratory Panel by RT PCR (Flu A&B, Covid) - Nasopharyngeal  Swab     Status: None   Collection Time: 12/19/19 11:26 PM   Specimen: Nasopharyngeal Swab  Result Value Ref Range Status   SARS Coronavirus 2 by RT PCR NEGATIVE NEGATIVE Final    Comment: (NOTE) SARS-CoV-2 target nucleic acids are NOT DETECTED. The SARS-CoV-2 RNA is generally detectable in upper respiratoy specimens during the acute phase of infection. The lowest concentration of SARS-CoV-2 viral copies this assay can detect is 131 copies/mL. A negative result does not preclude SARS-Cov-2 infection and should not be used as the sole basis for treatment or other patient management decisions. A negative result may occur with  improper specimen collection/handling, submission of specimen other than nasopharyngeal swab, presence of viral mutation(s) within the areas targeted by this assay,  and inadequate number of viral copies (<131 copies/mL). A negative result must be combined with clinical observations, patient history, and epidemiological information. The expected result is Negative. Fact Sheet for Patients:  https://www.moore.com/ Fact Sheet for Healthcare Providers:  https://www.young.biz/ This test is not yet ap proved or cleared by the [place] FDA and  has been authorized for detection and/or diagnosis of SARS-CoV-2 by FDA under an Emergency Use Authorization (EUA). This EUA will remain  in effect (meaning this test can be used) for the duration of the COVID-19 declaration under Section 564(b)(1) of the Act, 21 U.S.C. section 360bbb-3(b)(1), unless the authorization is terminated or revoked sooner.    Influenza A by PCR NEGATIVE NEGATIVE Final   Influenza B by PCR NEGATIVE NEGATIVE Final    Comment: (NOTE) The Xpert Xpress SARS-CoV-2/FLU/RSV assay is intended as an aid in  the diagnosis of influenza from Nasopharyngeal swab specimens and  should not be used as a sole basis for treatment. Nasal washings and  aspirates are  unacceptable for Xpert Xpress SARS-CoV-2/FLU/RSV  testing. Fact Sheet for Patients: https://www.moore.com/ Fact Sheet for Healthcare Providers: https://www.young.biz/ This test is not yet approved or cleared by the [place] FDA and  has been authorized for detection and/or diagnosis of SARS-CoV-2 by  FDA under an Emergency Use Authorization (EUA). This EUA will remain  in effect (meaning this test can be used) for the duration of the  Covid-19 declaration under Section 564(b)(1) of the Act, 21  U.S.C. section 360bbb-3(b)(1), unless the authorization is  terminated or revoked. Performed at [facility], [address] [place], [place] [address]   Respiratory Panel by RT PCR (Flu A&B, Covid) - Nasopharyngeal Swab     Status: None   Collection Time: 12/22/19 11:36 AM   Specimen: Nasopharyngeal Swab  Result Value Ref Range Status   SARS Coronavirus 2 by RT PCR NEGATIVE NEGATIVE Final    Comment: (NOTE) SARS-CoV-2 target nucleic acids are NOT DETECTED. The SARS-CoV-2 RNA is generally detectable in upper respiratoy specimens during the acute phase of infection. The lowest concentration of SARS-CoV-2 viral copies this assay can detect is 131 copies/mL. A negative result does not preclude SARS-Cov-2 infection and should not be used as the sole basis for treatment or other patient management decisions. A negative result may occur with  improper specimen collection/handling, submission of specimen other than nasopharyngeal swab, presence of viral mutation(s) within the areas targeted by this assay, and inadequate number of viral copies (<131 copies/mL). A negative result must be combined with clinical observations, patient history, and epidemiological information. The expected result is Negative. Fact Sheet for Patients:  https://www.moore.com/ Fact Sheet for Healthcare Providers:   https://www.young.biz/ This test is not yet ap proved or cleared by the [place] FDA and  has been authorized for detection and/or diagnosis of SARS-CoV-2 by FDA under an Emergency Use Authorization (EUA). This EUA will remain  in effect (meaning this test can be used) for the duration of the COVID-19 declaration under Section 564(b)(1) of the Act, 21 U.S.C. section 360bbb-3(b)(1), unless the authorization is terminated or revoked sooner.    Influenza A by PCR NEGATIVE NEGATIVE Final   Influenza B by PCR NEGATIVE NEGATIVE Final    Comment: (NOTE) The Xpert Xpress SARS-CoV-2/FLU/RSV assay is intended as an aid in  the diagnosis of influenza from Nasopharyngeal swab specimens and  should not be used as a sole basis for treatment. Nasal washings and  aspirates are unacceptable for Xpert Xpress SARS-CoV-2/FLU/RSV  testing.  Fact Sheet for Patients: https://www.moore.com/https://www.fda.gov/media/142436/download Fact Sheet for Healthcare Providers: https://www.young.biz/https://www.fda.gov/media/142435/download This test is not yet approved or cleared by the [place] FDA and  has been authorized for detection and/or diagnosis of SARS-CoV-2 by  FDA under an Emergency Use Authorization (EUA). This EUA will remain  in effect (meaning this test can be used) for the duration of the  Covid-19 declaration under Section 564(b)(1) of the Act, 21  U.S.C. section 360bbb-3(b)(1), unless the authorization is  terminated or revoked. Performed at [facility], [address] [place], [place] [id number]   Resp Panel by RT PCR (RSV, Flu A&B, Covid) -     Status: Abnormal   Collection Time: 12/22/19 11:36 AM  Result Value Ref Range Status   SARS Coronavirus 2 by RT PCR NEGATIVE NEGATIVE Final    Comment: (NOTE) SARS-CoV-2 target nucleic acids are NOT DETECTED. The SARS-CoV-2 RNA is generally detectable in upper respiratoy specimens during the acute phase of infection. The lowest concentration of  SARS-CoV-2 viral copies this assay can detect is 131 copies/mL. A negative result does not preclude SARS-Cov-2 infection and should not be used as the sole basis for treatment or other patient management decisions. A negative result may occur with  improper specimen collection/handling, submission of specimen other than nasopharyngeal swab, presence of viral mutation(s) within the areas targeted by this assay, and inadequate number of viral copies (<131 copies/mL). A negative result must be combined with clinical observations, patient history, and epidemiological information. The expected result is Negative. Fact Sheet for Patients:  https://www.moore.com/https://www.fda.gov/media/142436/download Fact Sheet for Healthcare Providers:  https://www.young.biz/https://www.fda.gov/media/142435/download This test is not yet ap proved or cleared by the [place] FDA and  has been authorized for detection and/or diagnosis of SARS-CoV-2 by FDA under an Emergency Use Authorization (EUA). This EUA will remain  in effect (meaning this test can be used) for the duration of the COVID-19 declaration under Section 564(b)(1) of the Act, 21 U.S.C. section 360bbb-3(b)(1), unless the authorization is terminated or revoked sooner.    Influenza A by PCR NEGATIVE NEGATIVE Final   Influenza B by PCR NEGATIVE NEGATIVE Final    Comment: (NOTE) The Xpert Xpress SARS-CoV-2/FLU/RSV assay is intended as an aid in  the diagnosis of influenza from Nasopharyngeal swab specimens and  should not be used as a sole basis for treatment. Nasal washings and  aspirates are unacceptable for Xpert Xpress SARS-CoV-2/FLU/RSV  testing. Fact Sheet for Patients: https://www.moore.com/https://www.fda.gov/media/142436/download Fact Sheet for Healthcare Providers: https://www.young.biz/https://www.fda.gov/media/142435/download This test is not yet approved or cleared by the [place] FDA and  has been authorized for detection and/or diagnosis of SARS-CoV-2 by  FDA under an Emergency Use Authorization (EUA).  This EUA will remain  in effect (meaning this test can be used) for the duration of the  Covid-19 declaration under Section 564(b)(1) of the Act, 21  U.S.C. section 360bbb-3(b)(1), unless the authorization is  terminated or revoked.    Respiratory Syncytial Virus by PCR POSITIVE (A) NEGATIVE Final    Comment: (NOTE) Fact Sheet for Patients: https://www.moore.com/https://www.fda.gov/media/142436/download Fact Sheet for Healthcare Providers: https://www.young.biz/https://www.fda.gov/media/142435/download This test is not yet approved or cleared by the [place] FDA and  has been authorized for detection and/or diagnosis of SARS-CoV-2 by  FDA under an Emergency Use Authorization (EUA). This EUA will remain  in effect (meaning this test can be used) for the duration of the  COVID-19 declaration under Section 564(b)(1) of the Act, 21 U.S.C.  section 360bbb-3(b)(1), unless the authorization is terminated or  revoked. Performed  at [facility], [address], [place],  [address]          Radiology Studies: [facility] Chest [place] 1 View  Result Date: 12/22/2019 CLINICAL DATA:  Shortness of breath and decreased oxygen saturation today. EXAM: PORTABLE CHEST 1 VIEW COMPARISON:  PA and lateral chest 12/19/2019 and 03/12/2019. CT chest 12/20/2019. FINDINGS: There is cardiomegaly and mild interstitial edema. No consolidative process, pneumothorax or effusion. No acute or focal bony abnormality. IMPRESSION: Cardiomegaly and mild interstitial pulmonary edema. Electronically Signed   By: Inge Rise M.D.   On: 12/22/2019 12:16        Scheduled Meds: . azithromycin  500 mg Oral Daily  . diltiazem  120 mg Oral Daily  . methylPREDNISolone (SOLU-MEDROL) injection  40 mg Intravenous Q6H  . nicotine  14 mg Transdermal Daily  . sodium chloride flush  3 mL Intravenous Q12H   Continuous Infusions: . sodium chloride    . sodium chloride 75 mL/hr at 12/23/19 0717  . diltiazem (CARDIZEM) infusion 2.5 mg/hr (12/23/19 0722)   . heparin 1,500 Units/hr (12/22/19 2310)     LOS: 1 day    Time spent: 34 mins     Wyvonnia Dusky, MD [facility] Pager [phone]  If 7PM-7AM, please contact night-coverage [email or web address] 12/23/2019, 8:49 AM

## 2019-12-23 NOTE — ED Notes (Signed)
Patient continues to remove Nasal Canula after being reminded and correctly placing it over and over again.

## 2019-12-23 NOTE — ED Notes (Signed)
RN has spoken to MD Mayford Knife via phone regarding patient mental status change.

## 2019-12-23 NOTE — ED Notes (Signed)
This tech answered pt's call-bell. Pt has broken nasal cannula.

## 2019-12-24 ENCOUNTER — Inpatient Hospital Stay (HOSPITAL_COMMUNITY)
Admit: 2019-12-24 | Discharge: 2019-12-24 | Disposition: A | Discharge status: Home / Self Care | Payer: Self-pay | Attending: Nurse Practitioner | Admitting: Nurse Practitioner

## 2019-12-24 ENCOUNTER — Ambulatory Visit: Admission: RE | Admit: 2019-12-24 | Payer: Self-pay | Source: Ambulatory Visit

## 2019-12-24 DIAGNOSIS — I4891 Unspecified atrial fibrillation: Secondary | ICD-10-CM

## 2019-12-24 LAB — BASIC METABOLIC PANEL
Anion gap: 6 (ref 5–15)
BUN: 28 mg/dL — ABNORMAL HIGH (ref 6–20)
CO2: 33 mmol/L — ABNORMAL HIGH (ref 22–32)
Calcium: 8.5 mg/dL — ABNORMAL LOW (ref 8.9–10.3)
Chloride: 95 mmol/L — ABNORMAL LOW (ref 98–111)
Creatinine, Ser: 0.9 mg/dL (ref 0.61–1.24)
GFR calc Af Amer: >60 mL/min (ref >60)
GFR calc non Af Amer: >60 mL/min (ref >60)
Glucose, Bld: 164 mg/dL — ABNORMAL HIGH (ref 70–99)
Potassium: 5.7 mmol/L — ABNORMAL HIGH (ref 3.5–5.1)
Sodium: 134 mmol/L — ABNORMAL LOW (ref 135–145)

## 2019-12-24 LAB — CULTURE, BLOOD (ROUTINE X 2)
Culture: NO GROWTH
Culture: NO GROWTH
Special Requests: ADEQUATE

## 2019-12-24 LAB — ECHOCARDIOGRAM COMPLETE
Height: 67 in
Weight: 4528 oz

## 2019-12-24 LAB — CBC
HCT: 50.5 % (ref 39.0–52.0)
Hemoglobin: 16.2 g/dL (ref 13.0–17.0)
MCH: 32.7 pg (ref 26.0–34.0)
MCHC: 32.1 g/dL (ref 30.0–36.0)
MCV: 101.8 fL — ABNORMAL HIGH (ref 80.0–100.0)
Platelets: 131 10*3/uL — ABNORMAL LOW (ref 150–400)
RBC: 4.96 MIL/uL (ref 4.22–5.81)
RDW: 15.2 % (ref 11.5–15.5)
WBC: 8.7 10*3/uL (ref 4.0–10.5)
nRBC: 0.7 % — ABNORMAL HIGH (ref 0.0–0.2)

## 2019-12-24 LAB — APTT: aPTT: 70 seconds — ABNORMAL HIGH (ref 24–36)

## 2019-12-24 LAB — POTASSIUM: Potassium: 5.1 mmol/L (ref 3.5–5.1)

## 2019-12-24 MED ORDER — CALCIUM GLUCONATE-NACL 1-0.675 GM/50ML-% IV SOLN
1.0000 g | Freq: Once | INTRAVENOUS | Status: AC
  Administered 2019-12-24: 1000 mg via INTRAVENOUS
  Filled 2019-12-24: qty 50

## 2019-12-24 MED ORDER — SODIUM ZIRCONIUM CYCLOSILICATE 10 G PO PACK
10.0000 g | PACK | Freq: Once | ORAL | Status: AC
  Administered 2019-12-24: 10 g via ORAL
  Filled 2019-12-24: qty 1

## 2019-12-24 NOTE — Progress Notes (Signed)
PROGRESS NOTE    Matthew [name]  PYP:[id number] DOB: [birth year] DOA: 12/22/2019 PCP: Rolm Gala, NP      Assessment & Plan:   Principal Problem:   Pulmonary embolism (HCC) Active Problems:   COPD with acute exacerbation (HCC)   Class 3 severe obesity without serious comorbidity with body mass index (BMI) of 40.0 to 44.9 in adult [facility])   Acute on chronic respiratory failure with hypoxia (HCC)   Atrial fibrillation with RVR (HCC)   Acute pulmonary embolism (HCC)   Non compliance w medication regimen   Acute pulmonary embolism: CT angiogram of the chest which was done on 12/20/18 and it showed occlusion of the right middle lobe pulmonary artery at its origin, with a small amount of thrombus extending into the right lower lobar artery. Markedly enlarged main pulmonary artery, consistent with pulmonary hypertension. CT scan findings were discussed with patient on 04/18/21and heparin drip was recommended but he declined and signed out AGAINST MEDICAL ADVICE stating that he would continue to take his Xarelto at home Patient admits to noncompliance with medication. Echo pending. Hx of IVC filter. Continue on IV heparin. No acute indication for surgical procedure as per vascular surg. Vascular surg recs apprec   Acute hypoxic respiratory failure: multifactorial related to acute COPD exacerbation as well as acute PE. Continue on supplemental oxygen and wean as tolerated   COPD with acute exacerbation: continue on bronchodilators & azithromycin. Continue on IV steroids. Encourage incentive spirometry   Atrial fibrillation: w/ RVR. PAF vs chronic. D/c IV cardizem drip and start po cardizem as per cardio. Continue heparin as primary prophylaxis for an acute stroke. Cardio following and recs apprec  Hyperkalemia: will give Ca gluconate & lokelma. Repeat potassium level ordered  Morbid obesity: BMI 44.3. Complicates overall prognosis and care   DVT prophylaxis: IV heparin Code  Status: full  Family Communication:  Disposition Plan: likely d/c home vs home health home     Consultants:   Vascular surgery    Procedures:    Antimicrobials: azithromycin    Subjective: Pt c/o shortness of breath still   Objective: Vitals:   12/24/19 0542 12/24/19 0552 12/24/19 0818 12/24/19 1203  BP: 102/72  (!) 92/58 (!) 85/68  Pulse: (!) 39 67 (!) 54 63  Resp: 20  16 18   Temp: 98.3 F (36.8 C)  98.1 F (36.7 C) (!) 97.5 F (36.4 C)  TempSrc: Oral   Oral  SpO2: 93%  97% 99%  Weight:      Height:        Intake/Output Summary (Last 24 hours) at 12/24/2019 1245 Last data filed at 12/24/2019 0939 Gross per 24 hour  Intake 1998.43 ml  Output 550 ml  Net 1448.43 ml   Filed Weights   12/22/19 1128  Weight: 128.4 kg    Examination:  General exam: Appears calm and comfortable. Morbidly obese Respiratory system: decreased breath sounds b/l.  Cardiovascular system: S1 & S2 +. No  rubs, gallops or clicks. B/l LE edema  Gastrointestinal system: Abdomen is obese, soft and nontender. Hypoactive bowel sounds heard. Central nervous system: Alert and oriented. Moves all 4 extremities. Psychiatry: Judgement and insight appear normal. Frustrated & agitated.     Data Reviewed: I have personally reviewed following labs and imaging studies  CBC: Recent Labs  Lab 12/19/19 1935 12/20/19 0432 12/22/19 1136 12/23/19 0414 12/24/19 0507  WBC 7.5 7.4 7.8 6.2 8.7  NEUTROABS 4.6  --  3.5  --   --   HGB  17.1* 16.7 16.0 15.9 16.2  HCT 52.3* 50.0 48.5 48.0 50.5  MCV 101.9* 101.2* 100.2* 100.6* 101.8*  PLT 164 152 134* 146* 131*   Basic Metabolic Panel: Recent Labs  Lab 12/19/19 1935 12/20/19 0432 12/22/19 1136 12/23/19 0414 12/24/19 0507  NA 137 137 138 136 134*  K 4.7 4.6 4.2 4.8 5.7*  CL 99 97* 98 96* 95*  CO2 28 34* 31 32 33*  GLUCOSE 107* 113* 122* 185* 164*  BUN 7 7 12 16  28*  CREATININE 0.84 0.92 0.93 0.86 0.90  CALCIUM 8.9 8.6* 8.5* 8.3* 8.5*    GFR: Estimated Creatinine Clearance: 127.8 mL/min (by C-G formula based on SCr of 0.9 mg/dL). Liver Function Tests: Recent Labs  Lab 12/19/19 1935  AST 19  ALT 16  ALKPHOS 149*  BILITOT 0.7  PROT 8.0  ALBUMIN 3.4*   No results for input(s): LIPASE, AMYLASE in the last 168 hours. No results for input(s): AMMONIA in the last 168 hours. Coagulation Profile: Recent Labs  Lab 12/19/19 1935 12/22/19 1136  INR 1.1 2.0*   Cardiac Enzymes: Recent Labs  Lab 12/17/19 1427  CKTOTAL 39*   BNP (last 3 results) No results for input(s): PROBNP in the last 8760 hours. HbA1C: No results for input(s): HGBA1C in the last 72 hours. CBG: No results for input(s): GLUCAP in the last 168 hours. Lipid Profile: No results for input(s): CHOL, HDL, LDLCALC, TRIG, CHOLHDL, LDLDIRECT in the last 72 hours. Thyroid Function Tests: No results for input(s): TSH, T4TOTAL, FREET4, T3FREE, THYROIDAB in the last 72 hours. Anemia Panel: No results for input(s): VITAMINB12, FOLATE, FERRITIN, TIBC, IRON, RETICCTPCT in the last 72 hours. Sepsis Labs: Recent Labs  Lab 12/19/19 1935 12/20/19 0432  PROCALCITON  --  <0.10  LATICACIDVEN 1.4  --     Recent Results (from the past 240 hour(s))  Culture, blood (Routine x 2)     Status: None   Collection Time: 12/19/19  7:35 PM   Specimen: BLOOD  Result Value Ref Range Status   Specimen Description BLOOD RIGHT ANTECUBITAL  Final   Special Requests   Final    BOTTLES DRAWN AEROBIC AND ANAEROBIC Blood Culture results may not be optimal due to an excessive volume of blood received in culture bottles   Culture   Final    NO GROWTH 5 DAYS Performed at [facility], [address] [place], [place] [address]    Report Status 12/24/2019 FINAL  Final  Culture, blood (Routine x 2)     Status: None   Collection Time: 12/19/19  7:36 PM   Specimen: BLOOD  Result Value Ref Range Status   Specimen Description BLOOD BLOOD RIGHT HAND  Final   Special  Requests   Final    BOTTLES DRAWN AEROBIC AND ANAEROBIC Blood Culture adequate volume   Culture   Final    NO GROWTH 5 DAYS Performed at [facility], [address] [place], [place] [address]    Report Status 12/24/2019 FINAL  Final  Respiratory Panel by RT PCR (Flu A&B, Covid) - Nasopharyngeal Swab     Status: None   Collection Time: 12/19/19 11:26 PM   Specimen: Nasopharyngeal Swab  Result Value Ref Range Status   SARS Coronavirus 2 by RT PCR NEGATIVE NEGATIVE Final    Comment: (NOTE) SARS-CoV-2 target nucleic acids are NOT DETECTED. The SARS-CoV-2 RNA is generally detectable in upper respiratoy specimens during the acute phase of infection. The lowest concentration of SARS-CoV-2 viral copies this  assay can detect is 131 copies/mL. A negative result does not preclude SARS-Cov-2 infection and should not be used as the sole basis for treatment or other patient management decisions. A negative result may occur with  improper specimen collection/handling, submission of specimen other than nasopharyngeal swab, presence of viral mutation(s) within the areas targeted by this assay, and inadequate number of viral copies (<131 copies/mL). A negative result must be combined with clinical observations, patient history, and epidemiological information. The expected result is Negative. Fact Sheet for Patients:  https://www.moore.com/ Fact Sheet for Healthcare Providers:  https://www.young.biz/ This test is not yet ap proved or cleared by the [place] FDA and  has been authorized for detection and/or diagnosis of SARS-CoV-2 by FDA under an Emergency Use Authorization (EUA). This EUA will remain  in effect (meaning this test can be used) for the duration of the COVID-19 declaration under Section 564(b)(1) of the Act, 21 U.S.C. section 360bbb-3(b)(1), unless the authorization is terminated or revoked sooner.    Influenza A by PCR NEGATIVE  NEGATIVE Final   Influenza B by PCR NEGATIVE NEGATIVE Final    Comment: (NOTE) The Xpert Xpress SARS-CoV-2/FLU/RSV assay is intended as an aid in  the diagnosis of influenza from Nasopharyngeal swab specimens and  should not be used as a sole basis for treatment. Nasal washings and  aspirates are unacceptable for Xpert Xpress SARS-CoV-2/FLU/RSV  testing. Fact Sheet for Patients: https://www.moore.com/ Fact Sheet for Healthcare Providers: https://www.young.biz/ This test is not yet approved or cleared by the [place] FDA and  has been authorized for detection and/or diagnosis of SARS-CoV-2 by  FDA under an Emergency Use Authorization (EUA). This EUA will remain  in effect (meaning this test can be used) for the duration of the  Covid-19 declaration under Section 564(b)(1) of the Act, 21  U.S.C. section 360bbb-3(b)(1), unless the authorization is  terminated or revoked. Performed at [facility], [address] [place], [place] [address]   Respiratory Panel by RT PCR (Flu A&B, Covid) - Nasopharyngeal Swab     Status: None   Collection Time: 12/22/19 11:36 AM   Specimen: Nasopharyngeal Swab  Result Value Ref Range Status   SARS Coronavirus 2 by RT PCR NEGATIVE NEGATIVE Final    Comment: (NOTE) SARS-CoV-2 target nucleic acids are NOT DETECTED. The SARS-CoV-2 RNA is generally detectable in upper respiratoy specimens during the acute phase of infection. The lowest concentration of SARS-CoV-2 viral copies this assay can detect is 131 copies/mL. A negative result does not preclude SARS-Cov-2 infection and should not be used as the sole basis for treatment or other patient management decisions. A negative result may occur with  improper specimen collection/handling, submission of specimen other than nasopharyngeal swab, presence of viral mutation(s) within the areas targeted by this assay, and inadequate number of viral copies (<131  copies/mL). A negative result must be combined with clinical observations, patient history, and epidemiological information. The expected result is Negative. Fact Sheet for Patients:  https://www.moore.com/ Fact Sheet for Healthcare Providers:  https://www.young.biz/ This test is not yet ap proved or cleared by the [place] FDA and  has been authorized for detection and/or diagnosis of SARS-CoV-2 by FDA under an Emergency Use Authorization (EUA). This EUA will remain  in effect (meaning this test can be used) for the duration of the COVID-19 declaration under Section 564(b)(1) of the Act, 21 U.S.C. section 360bbb-3(b)(1), unless the authorization is terminated or revoked sooner.    Influenza A by PCR NEGATIVE NEGATIVE Final  Influenza B by PCR NEGATIVE NEGATIVE Final    Comment: (NOTE) The Xpert Xpress SARS-CoV-2/FLU/RSV assay is intended as an aid in  the diagnosis of influenza from Nasopharyngeal swab specimens and  should not be used as a sole basis for treatment. Nasal washings and  aspirates are unacceptable for Xpert Xpress SARS-CoV-2/FLU/RSV  testing. Fact Sheet for Patients: https://www.moore.com/ Fact Sheet for Healthcare Providers: https://www.young.biz/ This test is not yet approved or cleared by the [place] FDA and  has been authorized for detection and/or diagnosis of SARS-CoV-2 by  FDA under an Emergency Use Authorization (EUA). This EUA will remain  in effect (meaning this test can be used) for the duration of the  Covid-19 declaration under Section 564(b)(1) of the Act, 21  U.S.C. section 360bbb-3(b)(1), unless the authorization is  terminated or revoked. Performed at [facility], [address] [place], [place] [address]   Resp Panel by RT PCR (RSV, Flu A&B, Covid) -     Status: Abnormal   Collection Time: 12/22/19 11:36 AM  Result Value Ref Range Status   SARS  Coronavirus 2 by RT PCR NEGATIVE NEGATIVE Final    Comment: (NOTE) SARS-CoV-2 target nucleic acids are NOT DETECTED. The SARS-CoV-2 RNA is generally detectable in upper respiratoy specimens during the acute phase of infection. The lowest concentration of SARS-CoV-2 viral copies this assay can detect is 131 copies/mL. A negative result does not preclude SARS-Cov-2 infection and should not be used as the sole basis for treatment or other patient management decisions. A negative result may occur with  improper specimen collection/handling, submission of specimen other than nasopharyngeal swab, presence of viral mutation(s) within the areas targeted by this assay, and inadequate number of viral copies (<131 copies/mL). A negative result must be combined with clinical observations, patient history, and epidemiological information. The expected result is Negative. Fact Sheet for Patients:  https://www.moore.com/ Fact Sheet for Healthcare Providers:  https://www.young.biz/ This test is not yet ap proved or cleared by the [place] FDA and  has been authorized for detection and/or diagnosis of SARS-CoV-2 by FDA under an Emergency Use Authorization (EUA). This EUA will remain  in effect (meaning this test can be used) for the duration of the COVID-19 declaration under Section 564(b)(1) of the Act, 21 U.S.C. section 360bbb-3(b)(1), unless the authorization is terminated or revoked sooner.    Influenza A by PCR NEGATIVE NEGATIVE Final   Influenza B by PCR NEGATIVE NEGATIVE Final    Comment: (NOTE) The Xpert Xpress SARS-CoV-2/FLU/RSV assay is intended as an aid in  the diagnosis of influenza from Nasopharyngeal swab specimens and  should not be used as a sole basis for treatment. Nasal washings and  aspirates are unacceptable for Xpert Xpress SARS-CoV-2/FLU/RSV  testing. Fact Sheet for Patients: https://www.moore.com/ Fact Sheet  for Healthcare Providers: https://www.young.biz/ This test is not yet approved or cleared by the [place] FDA and  has been authorized for detection and/or diagnosis of SARS-CoV-2 by  FDA under an Emergency Use Authorization (EUA). This EUA will remain  in effect (meaning this test can be used) for the duration of the  Covid-19 declaration under Section 564(b)(1) of the Act, 21  U.S.C. section 360bbb-3(b)(1), unless the authorization is  terminated or revoked.    Respiratory Syncytial Virus by PCR POSITIVE (A) NEGATIVE Final    Comment: (NOTE) Fact Sheet for Patients: https://www.moore.com/ Fact Sheet for Healthcare Providers: https://www.young.biz/ This test is not yet approved or cleared by the [place] FDA and  has been  authorized for detection and/or diagnosis of SARS-CoV-2 by  FDA under an Emergency Use Authorization (EUA). This EUA will remain  in effect (meaning this test can be used) for the duration of the  COVID-19 declaration under Section 564(b)(1) of the Act, 21 U.S.C.  section 360bbb-3(b)(1), unless the authorization is terminated or  revoked. Performed at [facility], [address], [place], [place] [address]          Radiology Studies: No results found.      Scheduled Meds: . amiodarone  200 mg Oral BID  . azithromycin  500 mg Oral Daily  . diltiazem  240 mg Oral Daily  . methylPREDNISolone (SOLU-MEDROL) injection  40 mg Intravenous Q6H  . nicotine  14 mg Transdermal Daily  . sodium chloride flush  3 mL Intravenous Q12H   Continuous Infusions: . sodium chloride    . sodium chloride 75 mL/hr at 12/24/19 0935  . heparin 1,500 Units/hr (12/23/19 2030)     LOS: 2 days    Time spent: 36 mins     Wyvonnia Dusky, MD [facility] Pager [phone]  If 7PM-7AM, please contact night-coverage [email or web address] 12/24/2019, 12:45 PM

## 2019-12-24 NOTE — Consult Note (Signed)
ANTICOAGULATION CONSULT NOTE  Pharmacy Consult for Heparin Indication: pulmonary embolus  Allergies  Allergen Reactions  . Amoxicillin Rash    Patient Measurements: Height: 5\' 7"  (170.2 cm) Weight: 128.4 kg (283 lb) IBW/kg (Calculated) : 66.1 Heparin Dosing Weight: 93.6 kg  Vital Signs: Temp: 98.3 F (36.8 C) (04/22 0542) Temp Source: Oral (04/22 0542) BP: 102/72 (04/22 0542) Pulse Rate: 67 (04/22 0552)  Labs: Recent Labs    12/22/19 1136 12/22/19 1136 12/22/19 1405 12/22/19 2104 12/23/19 0206 12/23/19 0304 12/23/19 0414 12/24/19 0507  HGB 16.0  --   --   --   --   --  15.9  --   HCT 48.5  --   --   --   --   --  48.0  --   PLT 134*  --   --   --   --   --  146*  --   APTT 39*   < >  --  100*  --  93*  --  70*  LABPROT 22.9*  --   --   --   --   --   --   --   INR 2.0*  --   --   --   --   --   --   --   HEPARINUNFRC  --   --  >3.60*  --   --   --   --   --   CREATININE 0.93  --   --   --   --   --  0.86  --   TROPONINIHS 56*   < > 59*  --  44*  --  41*  --    < > = values in this interval not displayed.    Estimated Creatinine Clearance: 133.7 mL/min (by C-G formula based on SCr of 0.86 mg/dL).   Medical History: Past Medical History:  Diagnosis Date  . Arthritis   . Hx of blood clots   . Sleep apnea    patient states not anymore    Medications:  Medications Prior to Admission  Medication Sig Dispense Refill Last Dose  . Rivaroxaban 15 & 20 MG TBPK Follow package directions: Take one 15mg  tablet by mouth twice a day. On day 22, switch to one 20mg  tablet once a day. Take with food. (Patient taking differently: Take 20 mg by mouth daily. Follow package directions: Take one 15mg  tablet by mouth twice a day. On day 22, switch to one 20mg  tablet once a day. Take with food.) 51 each 0 12/21/2019 at Unknown time  . albuterol (VENTOLIN HFA) 108 (90 Base) MCG/ACT inhaler Inhale 2 puffs into the lungs every 6 (six) hours as needed for wheezing or shortness of  breath. 18 g 0 PRN at PRN  . nicotine (NICODERM CQ - DOSED IN MG/24 HOURS) 14 mg/24hr patch One 14mg  patch chest wall daily (okay to substitute generic) (Patient not taking: Reported on 11/23/2019) 28 patch 0 Not Taking at Unknown time   Scheduled:  . amiodarone  200 mg Oral BID  . azithromycin  500 mg Oral Daily  . diltiazem  240 mg Oral Daily  . methylPREDNISolone (SOLU-MEDROL) injection  40 mg Intravenous Q6H  . nicotine  14 mg Transdermal Daily  . sodium chloride flush  3 mL Intravenous Q12H   Infusions:  . sodium chloride    . sodium chloride 75 mL/hr at 12/23/19 2032  . heparin 1,500 Units/hr (12/23/19 2030)   PRN:  Anti-infectives (From admission,  onward)   Start     Dose/Rate Route Frequency Ordered Stop   12/22/19 1800  azithromycin (ZITHROMAX) tablet 500 mg     500 mg Oral Daily 12/22/19 1700        Assessment: Pharmacy consulted for heparin for PE. Pt was started on rivaroxaban outpatient and per med rec patient took it yesterday. Will order baseline heparin level, APTT and INR.   Goal of Therapy:  Heparin level 0.3-0.7 units/ml once APTT and Heparin level correlate.  aPTT 66-102 seconds Monitor platelets by anticoagulation protocol: Yes   Plan:  4/22 @ 0500 aPTT 70 seconds therapeutic. Will continue current rate at 1500 units/hr and will recheck aPTT w/ am labs. CBC stable will continue to monitor.  Tobie Lords, PharmD, BCPS 12/24/2019,6:43 AM

## 2019-12-24 NOTE — Plan of Care (Signed)
  Problem: Clinical Measurements: Goal: Respiratory complications will improve Outcome: Progressing   Problem: Education: Goal: Knowledge of General Education information will improve Description: Including pain rating scale, medication(s)/side effects and non-pharmacologic comfort measures Outcome: Progressing   

## 2019-12-24 NOTE — Progress Notes (Signed)
*  PRELIMINARY RESULTS* Echocardiogram 2D Echocardiogram has been performed.  Cristela Blue 12/24/2019, 9:48 AM

## 2019-12-24 NOTE — Telephone Encounter (Signed)
Spoke with pt's wife and informed her that per Dr. Tobi Bastos, pt will need to see a cardiologist and a pulmonologist for medical clearance prior to pt's planned endoscopy procedure due to pt's history of Afib and COPD. I explained that if the hospital does not refer pt to these specialist by the time he's discharged Dr. Tobi Bastos has agreed to send the referrals if pt is willing. We will postpone pt's procedure until he's obtained medical clearance. Pt's wife agrees and plans to inform pt.

## 2019-12-24 NOTE — Progress Notes (Signed)
SUBJECTIVE: Patient resting comfortably in bed.  Patient denies shortness of breath or chest pain at this time.   Vitals:   12/24/19 0542 12/24/19 0552 12/24/19 0818 12/24/19 1203  BP: 102/72  (!) 92/58 (!) 85/68  Pulse: (!) 39 67 (!) 54 63  Resp: 20  16 18   Temp: 98.3 F (36.8 C)  98.1 F (36.7 C) (!) 97.5 F (36.4 C)  TempSrc: Oral   Oral  SpO2: 93%  97% 99%  Weight:      Height:        Intake/Output Summary (Last 24 hours) at 12/24/2019 1213 Last data filed at 12/24/2019 0939 Gross per 24 hour  Intake 1998.43 ml  Output 550 ml  Net 1448.43 ml    LABS: Basic Metabolic Panel: Recent Labs    12/23/19 0414 12/24/19 0507  NA 136 134*  K 4.8 5.7*  CL 96* 95*  CO2 32 33*  GLUCOSE 185* 164*  BUN 16 28*  CREATININE 0.86 0.90  CALCIUM 8.3* 8.5*   Liver Function Tests: No results for input(s): AST, ALT, ALKPHOS, BILITOT, PROT, ALBUMIN in the last 72 hours. No results for input(s): LIPASE, AMYLASE in the last 72 hours. CBC: Recent Labs    12/22/19 1136 12/22/19 1136 12/23/19 0414 12/24/19 0507  WBC 7.8   < > 6.2 8.7  NEUTROABS 3.5  --   --   --   HGB 16.0   < > 15.9 16.2  HCT 48.5   < > 48.0 50.5  MCV 100.2*   < > 100.6* 101.8*  PLT 134*   < > 146* 131*   < > = values in this interval not displayed.   Cardiac Enzymes: No results for input(s): CKTOTAL, CKMB, CKMBINDEX, TROPONINI in the last 72 hours. BNP: Invalid input(s): POCBNP D-Dimer: No results for input(s): DDIMER in the last 72 hours. Hemoglobin A1C: No results for input(s): HGBA1C in the last 72 hours. Fasting Lipid Panel: No results for input(s): CHOL, HDL, LDLCALC, TRIG, CHOLHDL, LDLDIRECT in the last 72 hours. Thyroid Function Tests: No results for input(s): TSH, T4TOTAL, T3FREE, THYROIDAB in the last 72 hours.  Invalid input(s): FREET3 Anemia Panel: No results for input(s): VITAMINB12, FOLATE, FERRITIN, TIBC, IRON, RETICCTPCT in the last 72 hours.   PHYSICAL EXAM General: Well developed,  well nourished, in no acute distress HEENT:  Normocephalic and atramatic Neck:  No JVD.  Lungs: Clear bilaterally to auscultation and percussion. Heart: A fib . Normal S1 and S2 without gallops or murmurs.  Abdomen: Bowel sounds are positive, abdomen soft and non-tender  Msk:  Back normal, normal gait. Normal strength and tone for age. Extremities: No clubbing, cyanosis or edema.   Neuro: Alert and oriented X 3. Psych:  Good affect, responds appropriately  TELEMETRY: Atrial fibrillation. 60s/bpm  ASSESSMENT AND PLAN: Patient presenting to the emergency department with shortness of breath after recently signing out [place] with a diagnosis of a pulmonary embolus.  Patient continues to be in atrial fibrillation.  Diltiazem infusion was discontinued yesterday and started on diltiazem 240 mg daily.  Heart rate is currently well rate controlled, if heart rate drops below 50 and patient is symptomatic please hold the dose.  Please continue the patient on amiodarone in attempt to convert the patient back to normal sinus. Will follow-up on already ordered echocardiogram.  Patient does not appear to be of any sort of respiratory distress but is still requiring 8 L nasal cannula.  Please continue heparin drip in regards to his pulmonary  embolus with a plan to eventually transition back to his Xarelto dose. Will continue to follow.  Principal Problem:   Pulmonary embolism (HCC) Active Problems:   COPD with acute exacerbation (HCC)   Class 3 severe obesity without serious comorbidity with body mass index (BMI) of 40.0 to 44.9 in adult [facility])   Acute on chronic respiratory failure with hypoxia (HCC)   Atrial fibrillation with RVR (HCC)   Acute pulmonary embolism (HCC)   Non compliance w medication regimen    Matthew Kaufmann, NP-C 12/24/2019 12:13 PM

## 2019-12-25 LAB — CBC
HCT: 49.2 % (ref 39.0–52.0)
Hemoglobin: 15.7 g/dL (ref 13.0–17.0)
MCH: 33.1 pg (ref 26.0–34.0)
MCHC: 31.9 g/dL (ref 30.0–36.0)
MCV: 103.6 fL — ABNORMAL HIGH (ref 80.0–100.0)
Platelets: 135 10*3/uL — ABNORMAL LOW (ref 150–400)
RBC: 4.75 MIL/uL (ref 4.22–5.81)
RDW: 15.1 % (ref 11.5–15.5)
WBC: 11.6 10*3/uL — ABNORMAL HIGH (ref 4.0–10.5)
nRBC: 0.9 % — ABNORMAL HIGH (ref 0.0–0.2)

## 2019-12-25 LAB — BASIC METABOLIC PANEL
Anion gap: 5 (ref 5–15)
BUN: 28 mg/dL — ABNORMAL HIGH (ref 6–20)
CO2: 34 mmol/L — ABNORMAL HIGH (ref 22–32)
Calcium: 8.7 mg/dL — ABNORMAL LOW (ref 8.9–10.3)
Chloride: 98 mmol/L (ref 98–111)
Creatinine, Ser: 0.88 mg/dL (ref 0.61–1.24)
GFR calc Af Amer: >60 mL/min (ref >60)
GFR calc non Af Amer: >60 mL/min (ref >60)
Glucose, Bld: 147 mg/dL — ABNORMAL HIGH (ref 70–99)
Potassium: 5.2 mmol/L — ABNORMAL HIGH (ref 3.5–5.1)
Sodium: 137 mmol/L (ref 135–145)

## 2019-12-25 LAB — APTT: aPTT: 62 seconds — ABNORMAL HIGH (ref 24–36)

## 2019-12-25 LAB — HEPARIN LEVEL (UNFRACTIONATED): Heparin Unfractionated: 0.77 IU/mL — ABNORMAL HIGH (ref 0.30–0.70)

## 2019-12-25 MED ORDER — RIVAROXABAN 20 MG PO TABS: 20.0000 mg | ORAL_TABLET | Freq: Every day | ORAL | Status: DC

## 2019-12-25 MED ORDER — METHYLPREDNISOLONE SODIUM SUCC 40 MG IJ SOLR
40.0000 mg | Freq: Three times a day (TID) | INTRAMUSCULAR | Status: DC
  Administered 2019-12-25 – 2019-12-29 (×12): 40 mg via INTRAVENOUS
  Filled 2019-12-25 (×12): qty 1

## 2019-12-25 MED ORDER — RIVAROXABAN 15 MG PO TABS
15.0000 mg | ORAL_TABLET | Freq: Two times a day (BID) | ORAL | Status: DC
  Filled 2019-12-25: qty 1

## 2019-12-25 MED ORDER — RIVAROXABAN 15 MG PO TABS
15.0000 mg | ORAL_TABLET | Freq: Two times a day (BID) | ORAL | Status: DC
  Administered 2019-12-25 – 2019-12-29 (×9): 15 mg via ORAL
  Filled 2019-12-25 (×10): qty 1

## 2019-12-25 MED ORDER — DOCUSATE SODIUM 100 MG PO CAPS
200.0000 mg | ORAL_CAPSULE | Freq: Two times a day (BID) | ORAL | Status: DC
  Administered 2019-12-25 – 2019-12-29 (×8): 200 mg via ORAL
  Filled 2019-12-25 (×9): qty 2

## 2019-12-25 MED ORDER — BISACODYL 5 MG PO TBEC: 5.0000 mg | DELAYED_RELEASE_TABLET | Freq: Every day | ORAL | Status: DC | PRN

## 2019-12-25 MED ORDER — DILTIAZEM HCL ER COATED BEADS 180 MG PO CP24
180.0000 mg | ORAL_CAPSULE | Freq: Every day | ORAL | Status: DC
  Administered 2019-12-26: 180 mg via ORAL
  Filled 2019-12-25: qty 1

## 2019-12-25 MED ORDER — LORAZEPAM 2 MG/ML IJ SOLN
2.0000 mg | Freq: Two times a day (BID) | INTRAMUSCULAR | Status: DC | PRN
  Administered 2019-12-25: 2 mg via INTRAVENOUS
  Filled 2019-12-25: qty 1

## 2019-12-25 MED ORDER — RIVAROXABAN (XARELTO) VTE STARTER PACK (15 & 20 MG): ORAL_TABLET | ORAL | 0 refills | Status: DC

## 2019-12-25 MED ORDER — SODIUM ZIRCONIUM CYCLOSILICATE 10 G PO PACK
10.0000 g | PACK | Freq: Once | ORAL | Status: AC
  Administered 2019-12-25: 10 g via ORAL
  Filled 2019-12-25: qty 1

## 2019-12-25 MED ORDER — LORAZEPAM 2 MG/ML IJ SOLN: 1.0000 mg | Freq: Two times a day (BID) | INTRAMUSCULAR | Status: DC | PRN

## 2019-12-25 NOTE — Progress Notes (Signed)
SUBJECTIVE: Patient states he is not feeling well this morning.  On investigation eats more because the patient is frustrated to still be in the hospital.  Patient states that he must go home today and would not be reasoned with.  Patient denies chest pain and shortness of breath.   Vitals:   12/24/19 2333 12/25/19 0436 12/25/19 0816 12/25/19 0859  BP:  95/67 (!) 89/50 103/71  Pulse:  60 66 (!) 49  Resp: 20 18    Temp:   97.7 F (36.5 C)   TempSrc:   Oral   SpO2:  100% 96%   Weight:  131 kg    Height:        Intake/Output Summary (Last 24 hours) at 12/25/2019 0954 Last data filed at 12/25/2019 0400 Gross per 24 hour  Intake --  Output 1200 ml  Net -1200 ml    LABS: Basic Metabolic Panel: Recent Labs    12/24/19 0507 12/24/19 0507 12/24/19 1935 12/25/19 0452  NA 134*  --   --  137  K 5.7*   < > 5.1 5.2*  CL 95*  --   --  98  CO2 33*  --   --  34*  GLUCOSE 164*  --   --  147*  BUN 28*  --   --  28*  CREATININE 0.90  --   --  0.88  CALCIUM 8.5*  --   --  8.7*   < > = values in this interval not displayed.   Liver Function Tests: No results for input(s): AST, ALT, ALKPHOS, BILITOT, PROT, ALBUMIN in the last 72 hours. No results for input(s): LIPASE, AMYLASE in the last 72 hours. CBC: Recent Labs    12/22/19 1136 12/23/19 0414 12/24/19 0507 12/25/19 0452  WBC 7.8   < > 8.7 11.6*  NEUTROABS 3.5  --   --   --   HGB 16.0   < > 16.2 15.7  HCT 48.5   < > 50.5 49.2  MCV 100.2*   < > 101.8* 103.6*  PLT 134*   < > 131* 135*   < > = values in this interval not displayed.   Cardiac Enzymes: No results for input(s): CKTOTAL, CKMB, CKMBINDEX, TROPONINI in the last 72 hours. BNP: Invalid input(s): POCBNP D-Dimer: No results for input(s): DDIMER in the last 72 hours. Hemoglobin A1C: No results for input(s): HGBA1C in the last 72 hours. Fasting Lipid Panel: No results for input(s): CHOL, HDL, LDLCALC, TRIG, CHOLHDL, LDLDIRECT in the last 72 hours. Thyroid Function  Tests: No results for input(s): TSH, T4TOTAL, T3FREE, THYROIDAB in the last 72 hours.  Invalid input(s): FREET3 Anemia Panel: No results for input(s): VITAMINB12, FOLATE, FERRITIN, TIBC, IRON, RETICCTPCT in the last 72 hours.   PHYSICAL EXAM General: Well developed, well nourished, in no acute distress HEENT:  Normocephalic and atramatic Neck:  No JVD.  Lungs: Clear bilaterally to auscultation and percussion. Heart: HRRR . Normal S1 and S2 without gallops or murmurs.  Abdomen: Bowel sounds are positive, abdomen soft and non-tender  Msk:  Back normal, normal gait. Normal strength and tone for age. Extremities: No clubbing, cyanosis or edema.   Neuro: Alert and oriented X 3. Psych:  Good affect, responds appropriately  TELEMETRY: Atrial fibrillation.  57/bpm  ASSESSMENT AND PLAN: Patient presenting to the emergency department with shortness of breath after recently signing out AMA with a diagnosis of a pulmonary embolus. Patient continues to be in atrial fibrillation.    With the  patient's current heart rate remaining in the 50s will decrease diltiazem to 180 mg daily and will continue amiodarone as he remains in atrial fibrillation currently.  Patient remains asymptomatic with a bradycardic heart rate and mildly soft blood pressures.   Echocardiogram revealed an EF of 45 to 50%, left ventricular global hypokinesis, and mild LVH.  Mild HFrEF will be further managed in the outpatient setting.  Patient transitioned off heparin drip this morning and will continue his daily Xarelto. Patient does not appear to be of any sort of respiratory distress but is still requiring 8 L nasal cannula.   Patient is currently at risk of leaving the hospital AMA as he has a history of doing so.  I had extensive conversation with the patient explaining to him why he needs to remain in the hospital and he did not understand.  I would not consider the patient to be confused but would say he is unaware of his  current situation.  Bedside RN informed of patient's current frustration and risk of leaving.  Principal Problem:   Pulmonary embolism (HCC) Active Problems:   COPD with acute exacerbation (HCC)   Class 3 severe obesity without serious comorbidity with body mass index (BMI) of 40.0 to 44.9 in adult [facility])   Acute on chronic respiratory failure with hypoxia (HCC)   Atrial fibrillation with RVR ([place])   Acute pulmonary embolism (HCC)   Non compliance w medication regimen    Adaline Sill, NP-C 12/25/2019 9:54 AM

## 2019-12-25 NOTE — Progress Notes (Signed)
I have attempted to see patient twice about cpap. Patient is now crying and has been trying to eat for several hours from a plate in front of him. He remains on a cannula in no distress. Patient talking from one subject to the next. Will allow him to eat as he has asked.

## 2019-12-25 NOTE — Consult Note (Addendum)
Matthew [name] for Heparin to Xarelto Indication: pulmonary embolus  Allergies  Allergen Reactions  . Amoxicillin Rash    Patient Measurements: Height: 5\' 7"  (170.2 cm) Weight: 131 kg (288 lb 12.8 oz) IBW/kg (Calculated) : 66.1 Heparin Dosing Weight: 93.6 kg  Vital Signs: BP: 95/67 (04/23 0436) Pulse Rate: 60 (04/23 0436)  Labs: Recent Labs    12/22/19 1136 12/22/19 1136 12/22/19 1405 12/22/19 2104 12/23/19 0206 12/23/19 0304 12/23/19 0414 12/23/19 0414 12/24/19 0507 12/25/19 0452  HGB 16.0   < >  --   --   --   --  15.9   < > 16.2 15.7  HCT 48.5   < >  --   --   --   --  48.0  --  50.5 49.2  PLT 134*   < >  --   --   --   --  146*  --  131* 135*  APTT 39*  --   --    < >  --  93*  --   --  70* 62*  LABPROT 22.9*  --   --   --   --   --   --   --   --   --   INR 2.0*  --   --   --   --   --   --   --   --   --   HEPARINUNFRC  --   --  >3.60*  --   --   --   --   --   --  0.77*  CREATININE 0.93   < >  --   --   --   --  0.86  --  0.90 0.88  TROPONINIHS 56*   < > 59*  --  44*  --  41*  --   --   --    < > = values in this interval not displayed.    Estimated Creatinine Clearance: 132.3 mL/min (by C-G formula based on SCr of 0.88 mg/dL).   Medical History: Past Medical History:  Diagnosis Date  . Arthritis   . Hx of blood clots   . Sleep apnea    patient states not anymore    Medications:  Medications Prior to Admission  Medication Sig Dispense Refill Last Dose  . Rivaroxaban 15 & 20 MG TBPK Follow package directions: Take one 15mg  tablet by mouth twice a day. On day 22, switch to one 20mg  tablet once a day. Take with food. (Patient taking differently: Take 20 mg by mouth daily. Follow package directions: Take one 15mg  tablet by mouth twice a day. On day 22, switch to one 20mg  tablet once a day. Take with food.) 51 each 0 12/21/2019 at Unknown time  . albuterol (VENTOLIN HFA) 108 (90 Base) MCG/ACT inhaler Inhale 2 puffs into the  lungs every 6 (six) hours as needed for wheezing or shortness of breath. 18 g 0 PRN at PRN  . nicotine (NICODERM CQ - DOSED IN MG/24 HOURS) 14 mg/24hr patch One 14mg  patch chest wall daily (okay to substitute generic) (Patient not taking: Reported on 11/23/2019) 28 patch 0 Not Taking at Unknown time   Scheduled:  . amiodarone  200 mg Oral BID  . diltiazem  240 mg Oral Daily  . methylPREDNISolone (SOLU-MEDROL) injection  40 mg Intravenous Q6H  . nicotine  14 mg Transdermal Daily  . sodium chloride flush  3 mL Intravenous Q12H   Infusions:  .  sodium chloride    . sodium chloride 75 mL/hr at 12/25/19 0048  . heparin 1,650 Units/hr (12/25/19 0739)   PRN:  Anti-infectives (From admission, onward)   Start     Dose/Rate Route Frequency Ordered Stop   12/22/19 1800  azithromycin (ZITHROMAX) tablet 500 mg     500 mg Oral Daily 12/22/19 1700 12/24/19 1731      Assessment: Pharmacy consulted for Xarelto for PE. Pt was started on rivaroxaban outpatient and per d/w patient he completed Xarelto 15 mg BID (although when asked patient's response was "if you say so"; when asked again the patient stated he took meds until gone) and had started taking 20 mg prior to admission. Per chart review for this admission, patient admits to noncompliance with medications.  However, patient was seen on 2/26  (received thrombolysis with tPA and mechanical thrombectomy and started on Xarelto) then again in early March 2021 (was loaded with Xarelto prior to beginning maintenance dose.   When d/w patient h/o Xarelto, patient was under the impression that he will be discharged home today. He stated that he will not be able to pick up his medications until Monday.    Plan:  1. Stopped heparin and ordered Xarelto 15 mg twice daily (reload d/t non-compliance) x 21 days, then 20 mg once daily thereafter. Will check CBC and Scr every 3 days.  2. D/w Dr. Mayford Knife the patient's issue of not being able to pick up medication  until Monday. However, he will not be discharged home today. In the event the patient leaves AMA d/w Dr. Mayford Knife getting a prescription filled at [facility] and store it in the pharmacy.   Addendum @ 1024: Dr. Mayford Knife has [facility] a [facility] to [facility]. Once Xarelto is filled, it will be available in the main pharmacy in the event the patient does leave AMA/discharged.     Katha Cabal, PharmD 12/25/2019,8:11 AM

## 2019-12-25 NOTE — Progress Notes (Signed)
PROGRESS NOTE    Matthew [name]  PYP:[id number] DOB: June 16, 1971 DOA: 12/22/2019 PCP: Rolm Gala, NP      Assessment & Plan:   Principal Problem:   Pulmonary embolism (HCC) Active Problems:   COPD with acute exacerbation (HCC)   Class 3 severe obesity without serious comorbidity with body mass index (BMI) of 40.0 to 44.9 in adult [facility])   Acute on chronic respiratory failure with hypoxia (HCC)   Atrial fibrillation with RVR (HCC)   Acute pulmonary embolism (HCC)   Non compliance w medication regimen   Acute pulmonary embolism: CT angiogram of the chest which was done on 12/20/18 and it showed occlusion of the right middle lobe pulmonary artery at its origin, with a small amount of thrombus extending into the right lower lobar artery. Markedly enlarged main pulmonary artery, consistent with pulmonary hypertension. CT scan findings were discussed with patient on 12/20/19 & heparin drip was recommended but he declined and signed out AGAINST MEDICAL ADVICE stating that he would continue to take his Xarelto at home. Patient admits to noncompliance with medication. Echo shows LV 45-50% w/ LV global hypokinesis, RV systolic function is decreased, no shunts detected. Hx of IVC filter. D/c IV heparin drip and start xarelto. No acute indication for surgical procedure as per vascular surg. Vascular surg recs apprec   Acute hypoxic respiratory failure: multifactorial related to acute COPD exacerbation, RSV, as well as acute PE. Continue on supplemental oxygen and wean as tolerated   COPD with acute exacerbation: continue on bronchodilators. Completed abx course. Continue on IV steroids. Encourage incentive spirometry   Encephalopathy: intermittent. Etiology unclear, possibly secondary delirium vs hypercarbia.   RSV: continue w/ IV steroids, bronchodilators & supportive care. Continue on supplemental oxygen and wean as tolerated.   Atrial fibrillation: w/ RVR. PAF vs chronic. D/c IV  cardizem drip and continue po cardizem as per cardio. Started back on xarelto. Cardio following and recs apprec  Hyperkalemia: s/p Ca gluconate & lokelma 12/24/19. Will give lokelma again today   Morbid obesity: BMI 44.3. Complicates overall prognosis and care   DVT prophylaxis: IV heparin Code Status: full  Family Communication:  Disposition Plan: likely d/c home vs home health home     Consultants:   Vascular surgery   cardio   Procedures:    Antimicrobials:    Subjective: Pt c/o shortness of breath still   Objective: Vitals:   12/24/19 1627 12/24/19 2009 12/24/19 2333 12/25/19 0436  BP: (!) 92/54 99/66  95/67  Pulse: 71 70  60  Resp: 17 20 20 18   Temp: 97.9 F (36.6 C) 98.4 F (36.9 C)    TempSrc: Oral Oral    SpO2: 98% 97%  100%  Weight:    131 kg  Height:        Intake/Output Summary (Last 24 hours) at 12/25/2019 0812 Last data filed at 12/25/2019 0400 Gross per 24 hour  Intake --  Output 1550 ml  Net -1550 ml   Filed Weights   12/22/19 1128 12/25/19 0436  Weight: 128.4 kg 131 kg    Examination:  General exam: Appears calm and comfortable. Morbidly obese Respiratory system: course breath sounds b/l.  Cardiovascular system: S1 & S2 +. No  rubs, gallops or clicks. B/l LE edema  Gastrointestinal system: Abdomen is obese, soft and nontender. Hypoactive bowel sounds heard. Central nervous system: Lethargic. Moves all 4 extremities. Psychiatry: Judgement and insight appear abnormal.    Data Reviewed: I have personally reviewed following labs and imaging  studies  CBC: Recent Labs  Lab 12/19/19 1935 12/19/19 1935 12/20/19 0432 12/22/19 1136 12/23/19 0414 12/24/19 0507 12/25/19 0452  WBC 7.5   < > 7.4 7.8 6.2 8.7 11.6*  NEUTROABS 4.6  --   --  3.5  --   --   --   HGB 17.1*   < > 16.7 16.0 15.9 16.2 15.7  HCT 52.3*   < > 50.0 48.5 48.0 50.5 49.2  MCV 101.9*   < > 101.2* 100.2* 100.6* 101.8* 103.6*  PLT 164   < > 152 134* 146* 131* 135*   <  > = values in this interval not displayed.   Basic Metabolic Panel: Recent Labs  Lab 12/20/19 0432 12/20/19 0432 12/22/19 1136 12/23/19 0414 12/24/19 0507 12/24/19 1935 12/25/19 0452  NA 137  --  138 136 134*  --  137  K 4.6   < > 4.2 4.8 5.7* 5.1 5.2*  CL 97*  --  98 96* 95*  --  98  CO2 34*  --  31 32 33*  --  34*  GLUCOSE 113*  --  122* 185* 164*  --  147*  BUN 7  --  12 16 28*  --  28*  CREATININE 0.92  --  0.93 0.86 0.90  --  0.88  CALCIUM 8.6*  --  8.5* 8.3* 8.5*  --  8.7*   < > = values in this interval not displayed.   GFR: Estimated Creatinine Clearance: 132.3 mL/min (by C-G formula based on SCr of 0.88 mg/dL). Liver Function Tests: Recent Labs  Lab 12/19/19 1935  AST 19  ALT 16  ALKPHOS 149*  BILITOT 0.7  PROT 8.0  ALBUMIN 3.4*   No results for input(s): LIPASE, AMYLASE in the last 168 hours. No results for input(s): AMMONIA in the last 168 hours. Coagulation Profile: Recent Labs  Lab 12/19/19 1935 12/22/19 1136  INR 1.1 2.0*   Cardiac Enzymes: No results for input(s): CKTOTAL, CKMB, CKMBINDEX, TROPONINI in the last 168 hours. BNP (last 3 results) No results for input(s): PROBNP in the last 8760 hours. HbA1C: No results for input(s): HGBA1C in the last 72 hours. CBG: No results for input(s): GLUCAP in the last 168 hours. Lipid Profile: No results for input(s): CHOL, HDL, LDLCALC, TRIG, CHOLHDL, LDLDIRECT in the last 72 hours. Thyroid Function Tests: No results for input(s): TSH, T4TOTAL, FREET4, T3FREE, THYROIDAB in the last 72 hours. Anemia Panel: No results for input(s): VITAMINB12, FOLATE, FERRITIN, TIBC, IRON, RETICCTPCT in the last 72 hours. Sepsis Labs: Recent Labs  Lab 12/19/19 1935 12/20/19 0432  PROCALCITON  --  <0.10  LATICACIDVEN 1.4  --     Recent Results (from the past 240 hour(s))  Culture, blood (Routine x 2)     Status: None   Collection Time: 12/19/19  7:35 PM   Specimen: BLOOD  Result Value Ref Range Status   Specimen  Description BLOOD RIGHT ANTECUBITAL  Final   Special Requests   Final    BOTTLES DRAWN AEROBIC AND ANAEROBIC Blood Culture results may not be optimal due to an excessive volume of blood received in culture bottles   Culture   Final    NO GROWTH 5 DAYS Performed at [facility], [address] [place], [place] [address]    Report Status 12/24/2019 FINAL  Final  Culture, blood (Routine x 2)     Status: None   Collection Time: 12/19/19  7:36 PM   Specimen: BLOOD  Result Value  Ref Range Status   Specimen Description BLOOD BLOOD RIGHT HAND  Final   Special Requests   Final    BOTTLES DRAWN AEROBIC AND ANAEROBIC Blood Culture adequate volume   Culture   Final    NO GROWTH 5 DAYS Performed at [facility], [address] [place], [place] [address]    Report Status 12/24/2019 FINAL  Final  Respiratory Panel by RT PCR (Flu A&B, Covid) - Nasopharyngeal Swab     Status: None   Collection Time: 12/19/19 11:26 PM   Specimen: Nasopharyngeal Swab  Result Value Ref Range Status   SARS Coronavirus 2 by RT PCR NEGATIVE NEGATIVE Final    Comment: (NOTE) SARS-CoV-2 target nucleic acids are NOT DETECTED. The SARS-CoV-2 RNA is generally detectable in upper respiratoy specimens during the acute phase of infection. The lowest concentration of SARS-CoV-2 viral copies this assay can detect is 131 copies/mL. A negative result does not preclude SARS-Cov-2 infection and should not be used as the sole basis for treatment or other patient management decisions. A negative result may occur with  improper specimen collection/handling, submission of specimen other than nasopharyngeal swab, presence of viral mutation(s) within the areas targeted by this assay, and inadequate number of viral copies (<131 copies/mL). A negative result must be combined with clinical observations, patient history, and epidemiological information. The expected result is Negative. Fact Sheet for Patients:    PinkCheek.be Fact Sheet for Healthcare Providers:  GravelBags.it This test is not yet ap proved or cleared by the [place] FDA and  has been authorized for detection and/or diagnosis of SARS-CoV-2 by FDA under an Emergency Use Authorization (EUA). This EUA will remain  in effect (meaning this test can be used) for the duration of the COVID-19 declaration under Section 564(b)(1) of the Act, 21 U.S.C. section 360bbb-3(b)(1), unless the authorization is terminated or revoked sooner.    Influenza A by PCR NEGATIVE NEGATIVE Final   Influenza B by PCR NEGATIVE NEGATIVE Final    Comment: (NOTE) The Xpert Xpress SARS-CoV-2/FLU/RSV assay is intended as an aid in  the diagnosis of influenza from Nasopharyngeal swab specimens and  should not be used as a sole basis for treatment. Nasal washings and  aspirates are unacceptable for Xpert Xpress SARS-CoV-2/FLU/RSV  testing. Fact Sheet for Patients: PinkCheek.be Fact Sheet for Healthcare Providers: GravelBags.it This test is not yet approved or cleared by the [place] FDA and  has been authorized for detection and/or diagnosis of SARS-CoV-2 by  FDA under an Emergency Use Authorization (EUA). This EUA will remain  in effect (meaning this test can be used) for the duration of the  Covid-19 declaration under Section 564(b)(1) of the Act, 21  U.S.C. section 360bbb-3(b)(1), unless the authorization is  terminated or revoked. Performed at [facility], [address] [place], [place] [address]   Respiratory Panel by RT PCR (Flu A&B, Covid) - Nasopharyngeal Swab     Status: None   Collection Time: 12/22/19 11:36 AM   Specimen: Nasopharyngeal Swab  Result Value Ref Range Status   SARS Coronavirus 2 by RT PCR NEGATIVE NEGATIVE Final    Comment: (NOTE) SARS-CoV-2 target nucleic acids are NOT DETECTED. The SARS-CoV-2  RNA is generally detectable in upper respiratoy specimens during the acute phase of infection. The lowest concentration of SARS-CoV-2 viral copies this assay can detect is 131 copies/mL. A negative result does not preclude SARS-Cov-2 infection and should not be used as the sole basis for treatment or other patient management decisions.  A negative result may occur with  improper specimen collection/handling, submission of specimen other than nasopharyngeal swab, presence of viral mutation(s) within the areas targeted by this assay, and inadequate number of viral copies (<131 copies/mL). A negative result must be combined with clinical observations, patient history, and epidemiological information. The expected result is Negative. Fact Sheet for Patients:  https://www.fda.gov/media/142436/download Fact Sheet for Healthcare Providers:  https://www.young.biz/https://www.fda.gov/media/142435/download This test is not yet ap proved or cleared by the [place]www.moore.com[place] FDA and  has been authorized for detection and/or diagnosis of SARS-CoV-2 by FDA under an Emergency Use Authorization (EUA). This EUA will remain  in effect (meaning this test can be used) for the duration of the COVID-19 declaration under Section 564(b)(1) of the Act, 21 U.S.C. section 360bbb-3(b)(1), unless the authorization is terminated or revoked sooner.    Influenza A by PCR NEGATIVE NEGATIVE Final   Influenza B by PCR NEGATIVE NEGATIVE Final    Comment: (NOTE) The Xpert Xpress SARS-CoV-2/FLU/RSV assay is intended as an aid in  the diagnosis of influenza from Nasopharyngeal swab specimens and  should not be used as a sole basis for treatment. Nasal washings and  aspirates are unacceptable for Xpert Xpress SARS-CoV-2/FLU/RSV  testing. Fact Sheet for Patients: https://www.moore.com/https://www.fda.gov/media/142436/download Fact Sheet for Healthcare Providers: https://www.young.biz/https://www.fda.gov/media/142435/download This test is not yet approved or cleared by the [place] FDA  and  has been authorized for detection and/or diagnosis of SARS-CoV-2 by  FDA under an Emergency Use Authorization (EUA). This EUA will remain  in effect (meaning this test can be used) for the duration of the  Covid-19 declaration under Section 564(b)(1) of the Act, 21  U.S.C. section 360bbb-3(b)(1), unless the authorization is  terminated or revoked. Performed at [facility], [address] [place], [place] [address]   Resp Panel by RT PCR (RSV, Flu A&B, Covid) -     Status: Abnormal   Collection Time: 12/22/19 11:36 AM  Result Value Ref Range Status   SARS Coronavirus 2 by RT PCR NEGATIVE NEGATIVE Final    Comment: (NOTE) SARS-CoV-2 target nucleic acids are NOT DETECTED. The SARS-CoV-2 RNA is generally detectable in upper respiratoy specimens during the acute phase of infection. The lowest concentration of SARS-CoV-2 viral copies this assay can detect is 131 copies/mL. A negative result does not preclude SARS-Cov-2 infection and should not be used as the sole basis for treatment or other patient management decisions. A negative result may occur with  improper specimen collection/handling, submission of specimen other than nasopharyngeal swab, presence of viral mutation(s) within the areas targeted by this assay, and inadequate number of viral copies (<131 copies/mL). A negative result must be combined with clinical observations, patient history, and epidemiological information. The expected result is Negative. Fact Sheet for Patients:  https://www.moore.com/https://www.fda.gov/media/142436/download Fact Sheet for Healthcare Providers:  https://www.young.biz/https://www.fda.gov/media/142435/download This test is not yet ap proved or cleared by the [place] FDA and  has been authorized for detection and/or diagnosis of SARS-CoV-2 by FDA under an Emergency Use Authorization (EUA). This EUA will remain  in effect (meaning this test can be used) for the duration of the COVID-19 declaration under Section  564(b)(1) of the Act, 21 U.S.C. section 360bbb-3(b)(1), unless the authorization is terminated or revoked sooner.    Influenza A by PCR NEGATIVE NEGATIVE Final   Influenza B by PCR NEGATIVE NEGATIVE Final    Comment: (NOTE) The Xpert Xpress SARS-CoV-2/FLU/RSV assay is intended as an aid in  the diagnosis of influenza from Nasopharyngeal swab specimens and  should  not be used as a sole basis for treatment. Nasal washings and  aspirates are unacceptable for Xpert Xpress SARS-CoV-2/FLU/RSV  testing. Fact Sheet for Patients: https://www.moore.com/ Fact Sheet for Healthcare Providers: https://www.young.biz/ This test is not yet approved or cleared by the [place] FDA and  has been authorized for detection and/or diagnosis of SARS-CoV-2 by  FDA under an Emergency Use Authorization (EUA). This EUA will remain  in effect (meaning this test can be used) for the duration of the  Covid-19 declaration under Section 564(b)(1) of the Act, 21  U.S.C. section 360bbb-3(b)(1), unless the authorization is  terminated or revoked.    Respiratory Syncytial Virus by PCR POSITIVE (A) NEGATIVE Final    Comment: (NOTE) Fact Sheet for Patients: https://www.moore.com/ Fact Sheet for Healthcare Providers: https://www.young.biz/ This test is not yet approved or cleared by the [place] FDA and  has been authorized for detection and/or diagnosis of SARS-CoV-2 by  FDA under an Emergency Use Authorization (EUA). This EUA will remain  in effect (meaning this test can be used) for the duration of the  COVID-19 declaration under Section 564(b)(1) of the Act, 21 U.S.C.  section 360bbb-3(b)(1), unless the authorization is terminated or  revoked. Performed at [facility], [address] [place], [place] [address]          Radiology Studies: ECHOCARDIOGRAM COMPLETE  Result Date: 12/24/2019    ECHOCARDIOGRAM  REPORT   Patient Name:   FREDERICO GERLING Date of Exam: 12/24/2019 Medical Rec #:  [id number]       Height:       67.0 in Accession #:    [id number]      Weight:       283.0 lb Date of Birth:  March 31, 1971       BSA:          2.344 m Patient Age:    48 years        BP:           92/58 mmHg Patient Gender: M               HR:           54 bpm. Exam Location:  [facility] Procedure: 2D Echo, Cardiac Doppler and Color Doppler Indications:     Atrial Fibrillation 427.31  History:         Patient has prior history of Echocardiogram examinations, most                  recent 03/11/2019. Risk Factors:Sleep Apnea. History of blood                  clots.  Sonographer:     Cristela Blue RDCS (AE) Referring Phys:  [id number] Caryn Bee PACKER Diagnosing Phys: Julien Nordmann MD  Sonographer Comments: No apical window and Technically difficult study due to poor echo windows. IMPRESSIONS  1. Left ventricular ejection fraction, by estimation, is 45 to 50%. The left ventricle has mildly decreased function. The left ventricle demonstrates global hypokinesis. There is mild left ventricular hypertrophy. Left ventricular diastolic parameters are indeterminate.  2. Right ventricular is not well visualized, systolic function is grossly moderately reduced. The right ventricular size is moderately enlarged. There is mildly elevated pulmonary artery systolic pressure.  3. Left atrial size was mildly dilated.  4. Challenging image quality FINDINGS  Left Ventricle: Left ventricular ejection fraction, by estimation, is 45 to 50%. The left ventricle has mildly decreased function. The left ventricle demonstrates global hypokinesis. The  left ventricular internal cavity size was normal in size. There is  mild left ventricular hypertrophy. Left ventricular diastolic parameters are indeterminate. Right Ventricle: The right ventricular size is moderately enlarged. No increase in right ventricular wall thickness. Right ventricular systolic function is moderately reduced.  There is mildly elevated pulmonary artery systolic pressure. The tricuspid regurgitant velocity is 2.44 m/s, and with an assumed right atrial pressure of 10 mmHg, the estimated right ventricular systolic pressure is 33.8 mmHg. Left Atrium: Left atrial size was mildly dilated. Right Atrium: Right atrial size was normal in size. Pericardium: There is no evidence of pericardial effusion. Mitral Valve: The mitral valve was not well visualized. Normal mobility of the mitral valve leaflets. No evidence of mitral valve regurgitation. No evidence of mitral valve stenosis. Tricuspid Valve: The tricuspid valve is not well visualized. Tricuspid valve regurgitation is mild . No evidence of tricuspid stenosis. Aortic Valve: The aortic valve is normal in structure. Aortic valve regurgitation is not visualized. No aortic stenosis is present. There is no evidence of aortic valve vegetation. Pulmonic Valve: The pulmonic valve was not well visualized. Pulmonic valve regurgitation is not visualized. No evidence of pulmonic stenosis. Aorta: The aortic root is normal in size and structure. Venous: The inferior vena cava is normal in size with greater than 50% respiratory variability, suggesting right atrial pressure of 3 mmHg. IAS/Shunts: No atrial level shunt detected by color flow Doppler.  LEFT VENTRICLE PLAX 2D LVIDd:         4.77 cm LVIDs:         3.75 cm LV PW:         1.32 cm LV IVS:        1.56 cm LVOT diam:     2.10 cm LVOT Area:     3.46 cm  LEFT ATRIUM         Index LA diam:    4.70 cm 2.01 cm/m                        PULMONIC VALVE AORTA                 PV Vmax:        0.53 m/s Ao Root diam: 2.80 cm PV Peak grad:   1.1 mmHg                       RVOT Peak grad: 3 mmHg  TRICUSPID VALVE TR Peak grad:   23.8 mmHg TR Vmax:        244.00 cm/s  SHUNTS Systemic Diam: 2.10 cm Julien Nordmann MD Electronically signed by Julien Nordmann MD Signature Date/Time: 12/24/2019/2:08:10 PM    Final         Scheduled Meds: . amiodarone   200 mg Oral BID  . diltiazem  240 mg Oral Daily  . methylPREDNISolone (SOLU-MEDROL) injection  40 mg Intravenous Q6H  . nicotine  14 mg Transdermal Daily  . sodium chloride flush  3 mL Intravenous Q12H   Continuous Infusions: . sodium chloride    . sodium chloride 75 mL/hr at 12/25/19 0048  . heparin 1,650 Units/hr (12/25/19 0739)     LOS: 3 days    Time spent: 30 mins     Charise Killian, MD [facility] Pager [phone]  If 7PM-7AM, please contact night-coverage [email or web address] 12/25/2019, 8:12 AM

## 2019-12-25 NOTE — Progress Notes (Addendum)
1430: Patients wife patricia called for an update. This RN attempted to call back twice with no answer.   1445: Received call from patients wife, gave update.

## 2019-12-25 NOTE — Consult Note (Signed)
[place] for Heparin Indication: pulmonary embolus  Allergies  Allergen Reactions  . Amoxicillin Rash    Patient Measurements: Height: 5\' 7"  (170.2 cm) Weight: 131 kg (288 lb 12.8 oz) IBW/kg (Calculated) : 66.1 Heparin Dosing Weight: 93.6 kg  Vital Signs: Temp: 98.4 F (36.9 C) (04/22 2009) Temp Source: Oral (04/22 2009) BP: 95/67 (04/23 0436) Pulse Rate: 60 (04/23 0436)  Labs: Recent Labs    12/22/19 1136 12/22/19 1136 12/22/19 1405 12/22/19 2104 12/23/19 0206 12/23/19 0304 12/23/19 0414 12/23/19 0414 12/24/19 0507 12/25/19 0452  HGB 16.0   < >  --   --   --   --  15.9   < > 16.2 15.7  HCT 48.5   < >  --   --   --   --  48.0  --  50.5 49.2  PLT 134*   < >  --   --   --   --  146*  --  131* 135*  APTT 39*  --   --    < >  --  93*  --   --  70* 62*  LABPROT 22.9*  --   --   --   --   --   --   --   --   --   INR 2.0*  --   --   --   --   --   --   --   --   --   HEPARINUNFRC  --   --  >3.60*  --   --   --   --   --   --  0.77*  CREATININE 0.93   < >  --   --   --   --  0.86  --  0.90 0.88  TROPONINIHS 56*   < > 59*  --  44*  --  41*  --   --   --    < > = values in this interval not displayed.    Estimated Creatinine Clearance: 132.3 mL/min (by C-G formula based on SCr of 0.88 mg/dL).   Medical History: Past Medical History:  Diagnosis Date  . Arthritis   . Hx of blood clots   . Sleep apnea    patient states not anymore    Medications:  Medications Prior to Admission  Medication Sig Dispense Refill Last Dose  . Rivaroxaban 15 & 20 MG TBPK Follow package directions: Take one 15mg  tablet by mouth twice a day. On day 22, switch to one 20mg  tablet once a day. Take with food. (Patient taking differently: Take 20 mg by mouth daily. Follow package directions: Take one 15mg  tablet by mouth twice a day. On day 22, switch to one 20mg  tablet once a day. Take with food.) 51 each 0 12/21/2019 at Unknown time  . albuterol (VENTOLIN  HFA) 108 (90 Base) MCG/ACT inhaler Inhale 2 puffs into the lungs every 6 (six) hours as needed for wheezing or shortness of breath. 18 g 0 PRN at PRN  . nicotine (NICODERM CQ - DOSED IN MG/24 HOURS) 14 mg/24hr patch One 14mg  patch chest wall daily (okay to substitute generic) (Patient not taking: Reported on 11/23/2019) 28 patch 0 Not Taking at Unknown time   Scheduled:  . amiodarone  200 mg Oral BID  . diltiazem  240 mg Oral Daily  . methylPREDNISolone (SOLU-MEDROL) injection  40 mg Intravenous Q6H  . nicotine  14 mg Transdermal Daily  . sodium  chloride flush  3 mL Intravenous Q12H   Infusions:  . sodium chloride    . sodium chloride 75 mL/hr at 12/25/19 0048  . heparin 1,500 Units/hr (12/24/19 1331)   PRN:  Anti-infectives (From admission, onward)   Start     Dose/Rate Route Frequency Ordered Stop   12/22/19 1800  azithromycin (ZITHROMAX) tablet 500 mg     500 mg Oral Daily 12/22/19 1700 12/24/19 1731      Assessment: Pharmacy consulted for heparin for PE. Pt was started on rivaroxaban outpatient and per med rec patient took it yesterday. Will order baseline heparin level, APTT and INR.   Goal of Therapy:  Heparin level 0.3-0.7 units/ml once APTT and Heparin level correlate.  aPTT 66-102 seconds Monitor platelets by anticoagulation protocol: Yes   Plan:  4/23 @ 0500 aPTT 62 seconds subtherapeutic. Will increase rate to 1650 units/hr and will recheck aPTT at 1200, CBC stable will continue to monitor.  Thomasene Ripple, PharmD, BCPS 12/25/2019,6:37 AM

## 2019-12-25 NOTE — Plan of Care (Signed)
  Problem: Education: Goal: Knowledge of General Education information will improve Description: Including pain rating scale, medication(s)/side effects and non-pharmacologic comfort measures Outcome: Progressing   Problem: Phase I Progression Outcomes Goal: Hemodynamically stable Outcome: Progressing   Problem: Phase I Progression Outcomes Goal: Voiding-avoid urinary catheter unless indicated Outcome: Progressing   Problem: Phase I Progression Outcomes Goal: Tolerating diet Outcome: Progressing   Problem: Phase I Progression Outcomes Goal: Dyspnea controlled at rest (PE) Outcome: Progressing   Problem: Clinical Measurements: Goal: Cardiovascular complication will be avoided Outcome: Progressing   Problem: Clinical Measurements: Goal: Respiratory complications will improve Outcome: Progressing   Problem: Clinical Measurements: Goal: Diagnostic test results will improve Outcome: Progressing   Problem: Clinical Measurements: Goal: Will remain free from infection Outcome: Progressing

## 2019-12-26 DIAGNOSIS — D696 Thrombocytopenia, unspecified: Secondary | ICD-10-CM

## 2019-12-26 LAB — BASIC METABOLIC PANEL
Anion gap: 6 (ref 5–15)
BUN: 30 mg/dL — ABNORMAL HIGH (ref 6–20)
CO2: 35 mmol/L — ABNORMAL HIGH (ref 22–32)
Calcium: 8.7 mg/dL — ABNORMAL LOW (ref 8.9–10.3)
Chloride: 95 mmol/L — ABNORMAL LOW (ref 98–111)
Creatinine, Ser: 0.79 mg/dL (ref 0.61–1.24)
GFR calc Af Amer: >60 mL/min (ref >60)
GFR calc non Af Amer: >60 mL/min (ref >60)
Glucose, Bld: 138 mg/dL — ABNORMAL HIGH (ref 70–99)
Potassium: 5 mmol/L (ref 3.5–5.1)
Sodium: 136 mmol/L (ref 135–145)

## 2019-12-26 LAB — URINE CULTURE: Culture: NO GROWTH

## 2019-12-26 LAB — CBC
HCT: 48.4 % (ref 39.0–52.0)
Hemoglobin: 15.6 g/dL (ref 13.0–17.0)
MCH: 33.2 pg (ref 26.0–34.0)
MCHC: 32.2 g/dL (ref 30.0–36.0)
MCV: 103 fL — ABNORMAL HIGH (ref 80.0–100.0)
Platelets: 138 10*3/uL — ABNORMAL LOW (ref 150–400)
RBC: 4.7 MIL/uL (ref 4.22–5.81)
RDW: 15.3 % (ref 11.5–15.5)
WBC: 8.7 10*3/uL (ref 4.0–10.5)
nRBC: 0.6 % — ABNORMAL HIGH (ref 0.0–0.2)

## 2019-12-26 MED ORDER — GUAIFENESIN ER 600 MG PO TB12
1200.0000 mg | ORAL_TABLET | Freq: Two times a day (BID) | ORAL | Status: DC
  Administered 2019-12-26 – 2019-12-29 (×7): 1200 mg via ORAL
  Filled 2019-12-26 (×7): qty 2

## 2019-12-26 MED ORDER — BENZONATATE 100 MG PO CAPS: 200.0000 mg | ORAL_CAPSULE | Freq: Three times a day (TID) | ORAL | Status: DC | PRN

## 2019-12-26 MED ORDER — CARVEDILOL 3.125 MG PO TABS
3.1250 mg | ORAL_TABLET | Freq: Two times a day (BID) | ORAL | Status: DC
  Administered 2019-12-26 – 2019-12-28 (×4): 3.125 mg via ORAL
  Filled 2019-12-26 (×5): qty 1

## 2019-12-26 MED ORDER — IPRATROPIUM-ALBUTEROL 0.5-2.5 (3) MG/3ML IN SOLN
3.0000 mL | Freq: Four times a day (QID) | RESPIRATORY_TRACT | Status: DC
  Administered 2019-12-26 – 2019-12-29 (×10): 3 mL via RESPIRATORY_TRACT
  Filled 2019-12-26 (×11): qty 3

## 2019-12-26 MED ORDER — AMIODARONE HCL 200 MG PO TABS
200.0000 mg | ORAL_TABLET | Freq: Every day | ORAL | Status: DC
  Administered 2019-12-27 – 2019-12-29 (×3): 200 mg via ORAL
  Filled 2019-12-26 (×3): qty 1

## 2019-12-26 NOTE — Plan of Care (Signed)
  Problem: Education: Goal: Knowledge of General Education information will improve Description: Including pain rating scale, medication(s)/side effects and non-pharmacologic comfort measures Outcome: Progressing   Problem: Clinical Measurements: Goal: Respiratory complications will improve Outcome: Progressing   

## 2019-12-26 NOTE — Progress Notes (Signed)
SUBJECTIVE: Patient states he is feeling better and continues to have improving respiratory status.  Patient denies chest pain.   Vitals:   12/25/19 1832 12/26/19 0306 12/26/19 0737 12/26/19 0919  BP:  110/70 91/63 107/74  Pulse:  (!) 57 61 74  Resp:   18   Temp:  97.9 F (36.6 C) 98.1 F (36.7 C)   TempSrc:  Oral Oral   SpO2: 93% 92% 96%   Weight:  131.5 kg    Height:        Intake/Output Summary (Last 24 hours) at 12/26/2019 1103 Last data filed at 12/26/2019 0930 Gross per 24 hour  Intake 360 ml  Output 1150 ml  Net -790 ml    LABS: Basic Metabolic Panel: Recent Labs    12/25/19 0452 12/26/19 0458  NA 137 136  K 5.2* 5.0  CL 98 95*  CO2 34* 35*  GLUCOSE 147* 138*  BUN 28* 30*  CREATININE 0.88 0.79  CALCIUM 8.7* 8.7*   Liver Function Tests: No results for input(s): AST, ALT, ALKPHOS, BILITOT, PROT, ALBUMIN in the last 72 hours. No results for input(s): LIPASE, AMYLASE in the last 72 hours. CBC: Recent Labs    12/25/19 0452 12/26/19 0458  WBC 11.6* 8.7  HGB 15.7 15.6  HCT 49.2 48.4  MCV 103.6* 103.0*  PLT 135* 138*   Cardiac Enzymes: No results for input(s): CKTOTAL, CKMB, CKMBINDEX, TROPONINI in the last 72 hours. BNP: Invalid input(s): POCBNP D-Dimer: No results for input(s): DDIMER in the last 72 hours. Hemoglobin A1C: No results for input(s): HGBA1C in the last 72 hours. Fasting Lipid Panel: No results for input(s): CHOL, HDL, LDLCALC, TRIG, CHOLHDL, LDLDIRECT in the last 72 hours. Thyroid Function Tests: No results for input(s): TSH, T4TOTAL, T3FREE, THYROIDAB in the last 72 hours.  Invalid input(s): FREET3 Anemia Panel: No results for input(s): VITAMINB12, FOLATE, FERRITIN, TIBC, IRON, RETICCTPCT in the last 72 hours.   PHYSICAL EXAM General: Well developed, well nourished, in no acute distress HEENT:  Normocephalic and atramatic Neck:  No JVD.  Lungs: Scattered wheezing and rhonchi. Heart: HRRR .  Atrial fibrillation. Abdomen: Bowel  sounds are positive, abdomen soft and non-tender  Msk:  Back normal, normal gait. Normal strength and tone for age. Extremities: No clubbing, cyanosis or edema.   Neuro: Alert and oriented X 3. Psych:  Good affect, responds appropriately  TELEMETRY: Atrial fibrillation.  87/bpm  ASSESSMENT AND PLAN: Patient presenting to the emergency department with shortness of breath after recently signing out AMA with a diagnosis of a pulmonary embolus. Patient continues to be in atrial fibrillation. Patient has failed conversion therapy but remains rate controlled and therefore will decrease amiodarone to 200 mg daily.  We will plan to continue diltiazem at 180 mg daily and add beta-blocker therapy for new diagnosis of HFrEF.   Continued HFrEF therapy will be further managed in the outpatient setting.  Plan to continue his daily Xarelto. Patient does not appear to be of any sort of respiratory distress but is still requiring 3 L nasal cannula and lung sounds with worsening wheezing and rhonchi.  Respiratory status discussed with hospitalist and I am in agreement that he is not ready to be discharged yet.  We will continue to follow.  Principal Problem:   Pulmonary embolism (HCC) Active Problems:   COPD with acute exacerbation (HCC)   Class 3 severe obesity without serious comorbidity with body mass index (BMI) of 40.0 to 44.9 in adult [facility])   Acute on chronic  respiratory failure with hypoxia (HCC)   Atrial fibrillation with RVR (HCC)   Acute pulmonary embolism (HCC)   Non compliance w medication regimen    Adaline Sill, NP-C 12/26/2019 11:03 AM

## 2019-12-26 NOTE — Progress Notes (Signed)
Patient has not requested to go on cpap. Able to place on w/o difficulty

## 2019-12-26 NOTE — Plan of Care (Signed)
  Problem: Education: Goal: Knowledge of General Education information will improve Description: Including pain rating scale, medication(s)/side effects and non-pharmacologic comfort measures Outcome: Progressing   Problem: Health Behavior/Discharge Planning: Goal: Ability to manage health-related needs will improve Outcome: Progressing   Problem: Clinical Measurements: Goal: Ability to maintain clinical measurements within normal limits will improve Outcome: Progressing Goal: Will remain free from infection Outcome: Progressing Goal: Diagnostic test results will improve Outcome: Progressing Goal: Respiratory complications will improve Outcome: Progressing Goal: Cardiovascular complication will be avoided Outcome: Progressing   Problem: Phase I Progression Outcomes Goal: Pain controlled with appropriate interventions Outcome: Progressing Goal: Dyspnea controlled at rest (PE) Outcome: Progressing Goal: Tolerating diet Outcome: Progressing Goal: Initial discharge plan identified Outcome: Progressing Goal: Voiding-avoid urinary catheter unless indicated Outcome: Progressing Goal: Hemodynamically stable Outcome: Progressing Goal: Other Phase I Outcomes/Goals Outcome: Progressing

## 2019-12-26 NOTE — Progress Notes (Signed)
OT Cancellation Note  Patient Details Name: Matthew [name] MRN: [id number] DOB: October 11, 1970   Cancelled Treatment:    Reason Eval/Treat Not Completed: Fatigue/lethargy limiting ability to participate. OT consult received and chart reviewed. Upon arrival pt asleep in bed, per conversation c RN pt worked c PT today and would benfit from rest. Will hold OT eval this date and follow up as available.   Kathie Dike, M.S. OTR/L  12/26/19, 3:35 PM

## 2019-12-26 NOTE — Progress Notes (Signed)
PROGRESS NOTE    Matthew [name]  TFT:[id number] DOB: 16-Dec-1975 DOA: 12/22/2019 PCP: Langston Reusing, NP      Assessment & Plan:   Principal Problem:   Pulmonary embolism (HCC) Active Problems:   COPD with acute exacerbation ([place])   Class 3 severe obesity without serious comorbidity with body mass index (BMI) of 40.0 to 44.9 in adult [facility])   Acute on chronic respiratory failure with hypoxia (HCC)   Atrial fibrillation with RVR (HCC)   Acute pulmonary embolism (HCC)   Non compliance w medication regimen   Acute pulmonary embolism: CT angiogram of the chest which was done on 12/20/18 and it showed occlusion of the right middle lobe pulmonary artery at its origin, with a small amount of thrombus extending into the right lower lobar artery. Markedly enlarged main pulmonary artery, consistent with pulmonary hypertension. CT scan findings were discussed with patient on 12/20/19 & heparin drip was recommended but he declined and signed out [place] stating that he would continue to take his Xarelto at home. Patient admits to noncompliance with medication. Echo shows LV 45-50% w/ LV global hypokinesis, RV systolic function is decreased, no shunts detected. Hx of IVC filter. D/c IV heparin drip and continue on xarelto. No acute indication for surgical procedure as per vascular surg. Vascular surg recs apprec   Acute hypoxic respiratory failure: multifactorial related to acute COPD exacerbation, RSV, as well as acute PE. Continue on supplemental oxygen and wean as tolerated   COPD with acute exacerbation: continue on bronchodilators. Completed abx course. Continue on IV steroids taper. Encourage incentive spirometry   Encephalopathy: intermittent. Etiology unclear, possibly secondary delirium vs hypercarbia.   RSV: continue w/ IV steroids, bronchodilators & supportive care. Continue on supplemental oxygen and wean as tolerated.   Atrial fibrillation: w/ RVR. PAF vs  chronic. D/c IV cardizem drip and continue po cardizem & start carvedilol as per cardio. Continue on xarelto. Cardio following and recs apprec  Hyperkalemia: resolved. Will continue to monitor   Morbid obesity: BMI 44.3. Complicates overall prognosis and care  Generalized weakness: PT recs SNF   Thrombocytopenia: etiology unclear. Will continue to monitor    DVT prophylaxis: xarelto  Code Status: full  Family Communication:  Disposition Plan: PT recs SNF but unlikely pt will go to SNF  Status is: Inpatient  Remains inpatient appropriate because:IV treatments appropriate due to intensity of illness or inability to take PO   Dispo: The patient is from: Home              Anticipated d/c is to: SNF but pt will likely refuse               Anticipated d/c date is: 2 days              Patient currently is not medically stable to d/c.       Consultants:   Vascular surgery   cardio   Procedures:    Antimicrobials:    Subjective: Pt c/o malaise & shortness of breath  Objective: Vitals:   12/25/19 1618 12/25/19 1832 12/26/19 0306 12/26/19 0737  BP:   110/70 91/63  Pulse: (!) 54  (!) 57 61  Resp:    18  Temp:   97.9 F (36.6 C) 98.1 F (36.7 C)  TempSrc:   Oral   SpO2: 97% 93% 92% 96%  Weight:   131.5 kg   Height:        Intake/Output Summary (Last 24 hours) at 12/26/2019  1610 Last data filed at 12/26/2019 0344 Gross per 24 hour  Intake --  Output 1400 ml  Net -1400 ml   Filed Weights   12/22/19 1128 12/25/19 0436 12/26/19 0306  Weight: 128.4 kg 131 kg 131.5 kg    Examination:  General exam: Appears calm and comfortable. Morbidly obese Respiratory system: course breath sounds b/l. Wheezes b/l  Cardiovascular system: S1 & S2 +. No  rubs, gallops or clicks. B/l LE edema  Gastrointestinal system: Abdomen is obese, soft and nontender. Normal bowel sounds heard. Central nervous system: Lethargic. Moves all 4 extremities. Psychiatry: Judgement and insight  appear abnormal.    Data Reviewed: I have personally reviewed following labs and imaging studies  CBC: Recent Labs  Lab 12/19/19 1935 12/20/19 0432 12/22/19 1136 12/23/19 0414 12/24/19 0507 12/25/19 0452 12/26/19 0458  WBC 7.5   < > 7.8 6.2 8.7 11.6* 8.7  NEUTROABS 4.6  --  3.5  --   --   --   --   HGB 17.1*   < > 16.0 15.9 16.2 15.7 15.6  HCT 52.3*   < > 48.5 48.0 50.5 49.2 48.4  MCV 101.9*   < > 100.2* 100.6* 101.8* 103.6* 103.0*  PLT 164   < > 134* 146* 131* 135* 138*   < > = values in this interval not displayed.   Basic Metabolic Panel: Recent Labs  Lab 12/22/19 1136 12/22/19 1136 12/23/19 0414 12/24/19 0507 12/24/19 1935 12/25/19 0452 12/26/19 0458  NA 138  --  136 134*  --  137 136  K 4.2   < > 4.8 5.7* 5.1 5.2* 5.0  CL 98  --  96* 95*  --  98 95*  CO2 31  --  32 33*  --  34* 35*  GLUCOSE 122*  --  185* 164*  --  147* 138*  BUN 12  --  16 28*  --  28* 30*  CREATININE 0.93  --  0.86 0.90  --  0.88 0.79  CALCIUM 8.5*  --  8.3* 8.5*  --  8.7* 8.7*   < > = values in this interval not displayed.   GFR: Estimated Creatinine Clearance: 145.8 mL/min (by C-G formula based on SCr of 0.79 mg/dL). Liver Function Tests: Recent Labs  Lab 12/19/19 1935  AST 19  ALT 16  ALKPHOS 149*  BILITOT 0.7  PROT 8.0  ALBUMIN 3.4*   No results for input(s): LIPASE, AMYLASE in the last 168 hours. No results for input(s): AMMONIA in the last 168 hours. Coagulation Profile: Recent Labs  Lab 12/19/19 1935 12/22/19 1136  INR 1.1 2.0*   Cardiac Enzymes: No results for input(s): CKTOTAL, CKMB, CKMBINDEX, TROPONINI in the last 168 hours. BNP (last 3 results) No results for input(s): PROBNP in the last 8760 hours. HbA1C: No results for input(s): HGBA1C in the last 72 hours. CBG: No results for input(s): GLUCAP in the last 168 hours. Lipid Profile: No results for input(s): CHOL, HDL, LDLCALC, TRIG, CHOLHDL, LDLDIRECT in the last 72 hours. Thyroid Function Tests: No  results for input(s): TSH, T4TOTAL, FREET4, T3FREE, THYROIDAB in the last 72 hours. Anemia Panel: No results for input(s): VITAMINB12, FOLATE, FERRITIN, TIBC, IRON, RETICCTPCT in the last 72 hours. Sepsis Labs: Recent Labs  Lab 12/19/19 1935 12/20/19 0432  PROCALCITON  --  <0.10  LATICACIDVEN 1.4  --     Recent Results (from the past 240 hour(s))  Culture, blood (Routine x 2)     Status: None  Collection Time: 12/19/19  7:35 PM   Specimen: BLOOD  Result Value Ref Range Status   Specimen Description BLOOD RIGHT ANTECUBITAL  Final   Special Requests   Final    BOTTLES DRAWN AEROBIC AND ANAEROBIC Blood Culture results may not be optimal due to an excessive volume of blood received in culture bottles   Culture   Final    NO GROWTH 5 DAYS Performed at [facility], [address], [place], [place] [address]    Report Status 12/24/2019 FINAL  Final  Culture, blood (Routine x 2)     Status: None   Collection Time: 12/19/19  7:36 PM   Specimen: BLOOD  Result Value Ref Range Status   Specimen Description BLOOD BLOOD RIGHT HAND  Final   Special Requests   Final    BOTTLES DRAWN AEROBIC AND ANAEROBIC Blood Culture adequate volume   Culture   Final    NO GROWTH 5 DAYS Performed at [facility], [address] [place], [place] [address]    Report Status 12/24/2019 FINAL  Final  Respiratory Panel by RT PCR (Flu A&B, Covid) - Nasopharyngeal Swab     Status: None   Collection Time: 12/19/19 11:26 PM   Specimen: Nasopharyngeal Swab  Result Value Ref Range Status   SARS Coronavirus 2 by RT PCR NEGATIVE NEGATIVE Final    Comment: (NOTE) SARS-CoV-2 target nucleic acids are NOT DETECTED. The SARS-CoV-2 RNA is generally detectable in upper respiratoy specimens during the acute phase of infection. The lowest concentration of SARS-CoV-2 viral copies this assay can detect is 131 copies/mL. A negative result does not preclude SARS-Cov-2 infection and should not be used as  the sole basis for treatment or other patient management decisions. A negative result may occur with  improper specimen collection/handling, submission of specimen other than nasopharyngeal swab, presence of viral mutation(s) within the areas targeted by this assay, and inadequate number of viral copies (<131 copies/mL). A negative result must be combined with clinical observations, patient history, and epidemiological information. The expected result is Negative. Fact Sheet for Patients:  https://www.moore.com/ Fact Sheet for Healthcare Providers:  https://www.young.biz/ This test is not yet ap proved or cleared by the [place] FDA and  has been authorized for detection and/or diagnosis of SARS-CoV-2 by FDA under an Emergency Use Authorization (EUA). This EUA will remain  in effect (meaning this test can be used) for the duration of the COVID-19 declaration under Section 564(b)(1) of the Act, 21 U.S.C. section 360bbb-3(b)(1), unless the authorization is terminated or revoked sooner.    Influenza A by PCR NEGATIVE NEGATIVE Final   Influenza B by PCR NEGATIVE NEGATIVE Final    Comment: (NOTE) The Xpert Xpress SARS-CoV-2/FLU/RSV assay is intended as an aid in  the diagnosis of influenza from Nasopharyngeal swab specimens and  should not be used as a sole basis for treatment. Nasal washings and  aspirates are unacceptable for Xpert Xpress SARS-CoV-2/FLU/RSV  testing. Fact Sheet for Patients: https://www.moore.com/ Fact Sheet for Healthcare Providers: https://www.young.biz/ This test is not yet approved or cleared by the [place] FDA and  has been authorized for detection and/or diagnosis of SARS-CoV-2 by  FDA under an Emergency Use Authorization (EUA). This EUA will remain  in effect (meaning this test can be used) for the duration of the  Covid-19 declaration under Section 564(b)(1) of the Act, 21    U.S.C. section 360bbb-3(b)(1), unless the authorization is  terminated or revoked. Performed at [facility], [address] [place], [place] [address]   Respiratory Panel by RT PCR (Flu A&B, Covid) - Nasopharyngeal Swab     Status: None   Collection Time: 12/22/19 11:36 AM   Specimen: Nasopharyngeal Swab  Result Value Ref Range Status   SARS Coronavirus 2 by RT PCR NEGATIVE NEGATIVE Final    Comment: (NOTE) SARS-CoV-2 target nucleic acids are NOT DETECTED. The SARS-CoV-2 RNA is generally detectable in upper respiratoy specimens during the acute phase of infection. The lowest concentration of SARS-CoV-2 viral copies this assay can detect is 131 copies/mL. A negative result does not preclude SARS-Cov-2 infection and should not be used as the sole basis for treatment or other patient management decisions. A negative result may occur with  improper specimen collection/handling, submission of specimen other than nasopharyngeal swab, presence of viral mutation(s) within the areas targeted by this assay, and inadequate number of viral copies (<131 copies/mL). A negative result must be combined with clinical observations, patient history, and epidemiological information. The expected result is Negative. Fact Sheet for Patients:  https://www.moore.com/https://www.fda.gov/media/142436/download Fact Sheet for Healthcare Providers:  https://www.young.biz/https://www.fda.gov/media/142435/download This test is not yet ap proved or cleared by the [place] FDA and  has been authorized for detection and/or diagnosis of SARS-CoV-2 by FDA under an Emergency Use Authorization (EUA). This EUA will remain  in effect (meaning this test can be used) for the duration of the COVID-19 declaration under Section 564(b)(1) of the Act, 21 U.S.C. section 360bbb-3(b)(1), unless the authorization is terminated or revoked sooner.    Influenza A by PCR NEGATIVE NEGATIVE Final   Influenza B by PCR NEGATIVE NEGATIVE Final    Comment:  (NOTE) The Xpert Xpress SARS-CoV-2/FLU/RSV assay is intended as an aid in  the diagnosis of influenza from Nasopharyngeal swab specimens and  should not be used as a sole basis for treatment. Nasal washings and  aspirates are unacceptable for Xpert Xpress SARS-CoV-2/FLU/RSV  testing. Fact Sheet for Patients: https://www.moore.com/https://www.fda.gov/media/142436/download Fact Sheet for Healthcare Providers: https://www.young.biz/https://www.fda.gov/media/142435/download This test is not yet approved or cleared by the [place] FDA and  has been authorized for detection and/or diagnosis of SARS-CoV-2 by  FDA under an Emergency Use Authorization (EUA). This EUA will remain  in effect (meaning this test can be used) for the duration of the  Covid-19 declaration under Section 564(b)(1) of the Act, 21  U.S.C. section 360bbb-3(b)(1), unless the authorization is  terminated or revoked. Performed at [facility], [address] [place], [place] [address]   Resp Panel by RT PCR (RSV, Flu A&B, Covid) -     Status: Abnormal   Collection Time: 12/22/19 11:36 AM  Result Value Ref Range Status   SARS Coronavirus 2 by RT PCR NEGATIVE NEGATIVE Final    Comment: (NOTE) SARS-CoV-2 target nucleic acids are NOT DETECTED. The SARS-CoV-2 RNA is generally detectable in upper respiratoy specimens during the acute phase of infection. The lowest concentration of SARS-CoV-2 viral copies this assay can detect is 131 copies/mL. A negative result does not preclude SARS-Cov-2 infection and should not be used as the sole basis for treatment or other patient management decisions. A negative result may occur with  improper specimen collection/handling, submission of specimen other than nasopharyngeal swab, presence of viral mutation(s) within the areas targeted by this assay, and inadequate number of viral copies (<131 copies/mL). A negative result must be combined with clinical observations, patient history, and epidemiological information.  The expected result is Negative. Fact Sheet for Patients:  https://www.moore.com/https://www.fda.gov/media/142436/download Fact Sheet for Healthcare Providers:  https://www.young.biz/ This test is not yet ap proved or cleared by the [place] and  has been authorized for detection and/or diagnosis of SARS-CoV-2 by FDA under an Emergency Use Authorization (EUA). This EUA will remain  in effect (meaning this test can be used) for the duration of the COVID-19 declaration under Section 564(b)(1) of the Act, 21 U.S.C. section 360bbb-3(b)(1), unless the authorization is terminated or revoked sooner.    Influenza A by PCR NEGATIVE NEGATIVE Final   Influenza B by PCR NEGATIVE NEGATIVE Final    Comment: (NOTE) The Xpert Xpress SARS-CoV-2/FLU/RSV assay is intended as an aid in  the diagnosis of influenza from Nasopharyngeal swab specimens and  should not be used as a sole basis for treatment. Nasal washings and  aspirates are unacceptable for Xpert Xpress SARS-CoV-2/FLU/RSV  testing. Fact Sheet for Patients: https://www.moore.com/ Fact Sheet for Healthcare Providers: https://www.young.biz/ This test is not yet approved or cleared by the [place] FDA and  has been authorized for detection and/or diagnosis of SARS-CoV-2 by  FDA under an Emergency Use Authorization (EUA). This EUA will remain  in effect (meaning this test can be used) for the duration of the  Covid-19 declaration under Section 564(b)(1) of the Act, 21  U.S.C. section 360bbb-3(b)(1), unless the authorization is  terminated or revoked.    Respiratory Syncytial Virus by PCR POSITIVE (A) NEGATIVE Final    Comment: (NOTE) Fact Sheet for Patients: https://www.moore.com/ Fact Sheet for Healthcare Providers: https://www.young.biz/ This test is not yet approved or cleared by the [place] FDA and  has been authorized for detection and/or  diagnosis of SARS-CoV-2 by  FDA under an Emergency Use Authorization (EUA). This EUA will remain  in effect (meaning this test can be used) for the duration of the  COVID-19 declaration under Section 564(b)(1) of the Act, 21 U.S.C.  section 360bbb-3(b)(1), unless the authorization is terminated or  revoked. Performed at [facility], [address] [place], [place] [address]          Radiology Studies: ECHOCARDIOGRAM COMPLETE  Result Date: 12/24/2019    ECHOCARDIOGRAM REPORT   Patient Name:   Matthew [name] Date of Exam: 12/24/2019 Medical Rec #:  [id number]       Height:       67.0 in Accession #:    [id number]      Weight:       283.0 lb Date of Birth:  11-Jan-1971       BSA:          2.344 m Patient Age:    48 years        BP:           92/58 mmHg Patient Gender: M               HR:           54 bpm. Exam Location:  [facility] Procedure: 2D Echo, Cardiac Doppler and Color Doppler Indications:     Atrial Fibrillation 427.31  History:         Patient has prior history of Echocardiogram examinations, most                  recent 03/11/2019. Risk Factors:Sleep Apnea. History of blood                  clots.  Sonographer:     Cristela Blue RDCS (AE) Referring Phys:  [id number] Caryn Bee PACKER Diagnosing Phys: Julien Nordmann MD  Sonographer Comments: No  apical window and Technically difficult study due to poor echo windows. IMPRESSIONS  1. Left ventricular ejection fraction, by estimation, is 45 to 50%. The left ventricle has mildly decreased function. The left ventricle demonstrates global hypokinesis. There is mild left ventricular hypertrophy. Left ventricular diastolic parameters are indeterminate.  2. Right ventricular is not well visualized, systolic function is grossly moderately reduced. The right ventricular size is moderately enlarged. There is mildly elevated pulmonary artery systolic pressure.  3. Left atrial size was mildly dilated.  4. Challenging image quality FINDINGS  Left Ventricle: Left  ventricular ejection fraction, by estimation, is 45 to 50%. The left ventricle has mildly decreased function. The left ventricle demonstrates global hypokinesis. The left ventricular internal cavity size was normal in size. There is  mild left ventricular hypertrophy. Left ventricular diastolic parameters are indeterminate. Right Ventricle: The right ventricular size is moderately enlarged. No increase in right ventricular wall thickness. Right ventricular systolic function is moderately reduced. There is mildly elevated pulmonary artery systolic pressure. The tricuspid regurgitant velocity is 2.44 m/s, and with an assumed right atrial pressure of 10 mmHg, the estimated right ventricular systolic pressure is 33.8 mmHg. Left Atrium: Left atrial size was mildly dilated. Right Atrium: Right atrial size was normal in size. Pericardium: There is no evidence of pericardial effusion. Mitral Valve: The mitral valve was not well visualized. Normal mobility of the mitral valve leaflets. No evidence of mitral valve regurgitation. No evidence of mitral valve stenosis. Tricuspid Valve: The tricuspid valve is not well visualized. Tricuspid valve regurgitation is mild . No evidence of tricuspid stenosis. Aortic Valve: The aortic valve is normal in structure. Aortic valve regurgitation is not visualized. No aortic stenosis is present. There is no evidence of aortic valve vegetation. Pulmonic Valve: The pulmonic valve was not well visualized. Pulmonic valve regurgitation is not visualized. No evidence of pulmonic stenosis. Aorta: The aortic root is normal in size and structure. Venous: The inferior vena cava is normal in size with greater than 50% respiratory variability, suggesting right atrial pressure of 3 mmHg. IAS/Shunts: No atrial level shunt detected by color flow Doppler.  LEFT VENTRICLE PLAX 2D LVIDd:         4.77 cm LVIDs:         3.75 cm LV PW:         1.32 cm LV IVS:        1.56 cm LVOT diam:     2.10 cm LVOT Area:      3.46 cm  LEFT ATRIUM         Index LA diam:    4.70 cm 2.01 cm/m                        PULMONIC VALVE AORTA                 PV Vmax:        0.53 m/s Ao Root diam: 2.80 cm PV Peak grad:   1.1 mmHg                       RVOT Peak grad: 3 mmHg  TRICUSPID VALVE TR Peak grad:   23.8 mmHg TR Vmax:        244.00 cm/s  SHUNTS Systemic Diam: 2.10 cm Julien Nordmann MD Electronically signed by Julien Nordmann MD Signature Date/Time: 12/24/2019/2:08:10 PM    Final         Scheduled Meds: . amiodarone  200  mg Oral BID  . diltiazem  180 mg Oral Daily  . docusate sodium  200 mg Oral BID  . methylPREDNISolone (SOLU-MEDROL) injection  40 mg Intravenous Q8H  . nicotine  14 mg Transdermal Daily  . Rivaroxaban  15 mg Oral BID WC   Followed by  . [START ON 01/15/2020] rivaroxaban  20 mg Oral Q supper  . sodium chloride flush  3 mL Intravenous Q12H   Continuous Infusions: . sodium chloride    . sodium chloride 50 mL/hr at 12/26/19 6045     LOS: 4 days    Time spent: 33 mins     Charise Killian, MD [facility] Pager [phone]  If 7PM-7AM, please contact night-coverage [email or web address] 12/26/2019, 7:55 AM

## 2019-12-26 NOTE — Evaluation (Signed)
Physical Therapy Evaluation Patient Details Name: Matthew [name] MRN: [id number] DOB: Oct 31, 1970 Today's Date: 12/26/2019   History of Present Illness  Pt is a 49 year old male with a history of arthritis, PEs, sleep apnea, COPD, tobacco abuse who presents to the ED for respiratory distress. 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 Markedly enlarged main pulmonary artery, consistent with pulmonary hypertension. CT scan findings were discussed with patient on 04/18/21and heparin drip was recommended but he declined and signed out [place] stating that he would continue to take his Xarelto at home. Patient admits to noncompliance with medication. Echo pending. Hx of IVC filter. Completed IV heparin. No acute indication for surgical procedure as per vascular surg.  Clinical Impression  Difficult to gauge true patient physical abilities d/t lack of motivation and self limiting behavior (innapropriate advances toward staff). Supine > sit modI with use of bedrails and sit > supine minA for LE negotiation. STS minA for initiation with patient able to comply with cuing for RW use. Following 51ft of ambulation with RW with consistent cuing to decrease speed, patietn with fatigue and O2 sat to 87%, quikcly rising with PLB. Would benefit from skilled PT to address above deficits and promote optimal return to PLOF.     Follow Up Recommendations Supervision/Assistance - 24 hour;SNF    Equipment Recommendations  Rolling walker with 5" wheels    Recommendations for Other Services       Precautions / Restrictions Precautions Precautions: Fall Restrictions Weight Bearing Restrictions: No      Mobility  Bed Mobility Overal bed mobility: Needs Assistance Bed Mobility: Supine to Sit;Sit to Supine     Supine to sit:  Modified independent (Device/Increase time) Sit to supine: Min assist   General bed mobility comments: Patient is able to complete supine to sit modI with cuing for hand placement/set up; minA for LE negotiation for sit > supine  Transfers Overall transfer level: Needs assistance Equipment used: Rolling walker (2 wheeled) Transfers: Sit to/from Stand Sit to Stand: Min assist         General transfer comment: minA needed for initiation, cuing needed for set up  Ambulation/Gait Ambulation/Gait assistance: Min guard Gait Distance (Feet): 75 Feet Assistive device: Rolling walker (2 wheeled) Gait Pattern/deviations: Shuffle Gait velocity: increased   General Gait Details: patient with increased gait, unable to comply with cuing for this  Stairs            Wheelchair Mobility    Modified Rankin (Stroke Patients Only)       Balance Overall balance assessment: Needs assistance Sitting-balance support: No upper extremity supported Sitting balance-Leahy Scale: Good     Standing balance support: During functional activity Standing balance-Leahy Scale: Fair                               Pertinent Vitals/Pain Pain Assessment: No/denies pain    Home Living Family/patient expects to be discharged to:: Private residence Living Arrangements: Children;Spouse/significant other Available Help at Discharge: Family Type of Home: House Home Access: Stairs to enter Entrance Stairs-Rails: Can reach both Entrance Stairs-Number of Steps: 3 Home Layout: One level   Additional Comments: Reports he was ind with ADLs without AD prior    Prior Function Level of Independence: Independent  Comments: Patient is a poor historian but when asked if he can complete ADLs says "what do you think, I bathe in my shower" so it is assumed that he can complete all of these     Hand Dominance   Dominant Hand: Right    Extremity/Trunk Assessment   Upper Extremity  Assessment Upper Extremity Assessment: Overall WFL for tasks assessed    Lower Extremity Assessment Lower Extremity Assessment: Overall WFL for tasks assessed    Cervical / Trunk Assessment Cervical / Trunk Assessment: Normal  Communication      Cognition Arousal/Alertness: Lethargic Behavior During Therapy: WFL for tasks assessed/performed Overall Cognitive Status: Within Functional Limits for tasks assessed                                        General Comments      Exercises Other Exercises Other Exercises: supine > sit patient is able to complete modI with cuing;  sit > supine needed minA for LE management Other Exercises: STS with RW, minA for initiation and cuing for hand placement with good carry over Other Exercises: ambulation with RW 7ft with heavy cuing to decrease speed for increased stability with patient unable to comply but good carry over to "stay close to RW"   Assessment/Plan    PT Assessment Patient needs continued PT services  PT Problem List Decreased strength;Decreased mobility;Decreased range of motion;Decreased coordination;Decreased balance;Decreased activity tolerance;Decreased knowledge of use of DME       PT Treatment Interventions Gait training;DME instruction;Therapeutic activities;Stair training;Functional mobility training;Neuromuscular re-education;Manual techniques;Balance training;Therapeutic exercise;Patient/family education    PT Goals (Current goals can be found in the Care Plan section)  Acute Rehab PT Goals Patient Stated Goal: go home PT Goal Formulation: With patient Time For Goal Achievement: 01/15/20 Potential to Achieve Goals: Fair    Frequency Min 2X/week   Barriers to discharge Decreased caregiver support      Co-evaluation               AM-PAC PT "6 Clicks" Mobility  Outcome Measure Help needed turning from your back to your side while in a flat bed without using bedrails?: A Little Help  needed moving from lying on your back to sitting on the side of a flat bed without using bedrails?: A Little Help needed moving to and from a bed to a chair (including a wheelchair)?: A Little Help needed standing up from a chair using your arms (e.g., wheelchair or bedside chair)?: A Little Help needed to walk in hospital room?: A Lot Help needed climbing 3-5 steps with a railing? : Total 6 Click Score: 15    End of Session Equipment Utilized During Treatment: Gait belt Activity Tolerance: Patient tolerated treatment well Patient left: in bed;with call bell/phone within reach;with bed alarm set;with family/visitor present Nurse Communication: Mobility status PT Visit Diagnosis: Unsteadiness on feet (R26.81);Muscle weakness (generalized) (M62.81);Other abnormalities of gait and mobility (R26.89);History of falling (Z91.81);Repeated falls (R29.6);Difficulty in walking, not elsewhere classified (R26.2)    Time: 9924-2683 PT Time Calculation (min) (ACUTE ONLY): 30 min   Charges:   PT Evaluation $PT Eval Moderate Complexity: 1 Mod PT Treatments $Therapeutic Activity: 23-37 mins        Staci Acosta PT, DPT  Staci Acosta 12/26/2019, 1:42 PM

## 2019-12-27 DIAGNOSIS — I5021 Acute systolic (congestive) heart failure: Secondary | ICD-10-CM

## 2019-12-27 LAB — BASIC METABOLIC PANEL
Anion gap: 6 (ref 5–15)
BUN: 26 mg/dL — ABNORMAL HIGH (ref 6–20)
CO2: 37 mmol/L — ABNORMAL HIGH (ref 22–32)
Calcium: 8.7 mg/dL — ABNORMAL LOW (ref 8.9–10.3)
Chloride: 94 mmol/L — ABNORMAL LOW (ref 98–111)
Creatinine, Ser: 0.8 mg/dL (ref 0.61–1.24)
GFR calc Af Amer: >60 mL/min (ref >60)
GFR calc non Af Amer: >60 mL/min (ref >60)
Glucose, Bld: 123 mg/dL — ABNORMAL HIGH (ref 70–99)
Potassium: 4.9 mmol/L (ref 3.5–5.1)
Sodium: 137 mmol/L (ref 135–145)

## 2019-12-27 LAB — CBC
HCT: 53.6 % — ABNORMAL HIGH (ref 39.0–52.0)
Hemoglobin: 17.4 g/dL — ABNORMAL HIGH (ref 13.0–17.0)
MCH: 33 pg (ref 26.0–34.0)
MCHC: 32.5 g/dL (ref 30.0–36.0)
MCV: 101.7 fL — ABNORMAL HIGH (ref 80.0–100.0)
Platelets: 111 10*3/uL — ABNORMAL LOW (ref 150–400)
RBC: 5.27 MIL/uL (ref 4.22–5.81)
RDW: 14.8 % (ref 11.5–15.5)
WBC: 8.6 10*3/uL (ref 4.0–10.5)
nRBC: 0.2 % (ref 0.0–0.2)

## 2019-12-27 MED ORDER — SPIRONOLACTONE 25 MG PO TABS
12.5000 mg | ORAL_TABLET | Freq: Every day | ORAL | Status: DC
  Administered 2019-12-28 – 2019-12-29 (×2): 12.5 mg via ORAL
  Filled 2019-12-27: qty 0.5
  Filled 2019-12-27: qty 1
  Filled 2019-12-27: qty 0.5
  Filled 2019-12-27: qty 1
  Filled 2019-12-27: qty 0.5

## 2019-12-27 MED ORDER — DILTIAZEM HCL ER COATED BEADS 120 MG PO CP24
120.0000 mg | ORAL_CAPSULE | Freq: Every day | ORAL | Status: DC
  Administered 2019-12-27 – 2019-12-29 (×3): 120 mg via ORAL
  Filled 2019-12-27 (×3): qty 1

## 2019-12-27 MED ORDER — LORAZEPAM 2 MG/ML IJ SOLN
1.0000 mg | Freq: Once | INTRAMUSCULAR | Status: AC
  Administered 2019-12-27: 1 mg via INTRAVENOUS
  Filled 2019-12-27: qty 1

## 2019-12-27 NOTE — NC FL2 (Signed)
[place] LEVEL OF CARE SCREENING TOOL     IDENTIFICATION  Patient Name: Matthew [name] Birthdate: [birth year] Sex: male Admission Date (Current Location): 12/22/2019  [place] and [place] Number:  Engineering geologist and Address:  [facility], [address] [place], [place] [address]      Provider Number: [id number]  Attending Physician Name and Address:  Wyvonnia Dusky, MD  Relative Name and Phone Number:       Current Level of Care: Hospital Recommended Level of Care: [place] Prior Approval Number:    Date Approved/Denied:   PASRR Number: [id number]  Discharge Plan: SNF    Current Diagnoses: Patient Active Problem List   Diagnosis Date Noted  . Shortness of breath 12/22/2019  . Pulmonary embolism ([place]) 12/22/2019  . Atrial fibrillation with RVR ([place]) 12/22/2019  . Acute pulmonary embolism ([place]) 12/22/2019  . Non compliance w medication regimen 12/22/2019  . COPD (chronic obstructive pulmonary disease) ([place]) 12/19/2019  . Sepsis ([place]) 12/19/2019  . Community acquired pneumonia 12/19/2019  . Acute on chronic respiratory failure with hypoxia ([place]) 12/19/2019  . OSA (obstructive sleep apnea) 12/19/2019  . Venous ulcer ([place]) 12/09/2019  . COPD with acute exacerbation ([place])   . Class 3 severe obesity without serious comorbidity with body mass index (BMI) of 40.0 to 44.9 in adult [facility])   . Other cirrhosis of liver ([place])   . Hypomagnesemia   . Tobacco abuse 10/29/2019  . Prediabetes 04/25/2019  . Wheeze 04/25/2019  . Ingrown toenail of right foot 04/23/2019  . AF (paroxysmal atrial fibrillation) ([place]) 03/19/2019  . DVT (deep venous thrombosis) ([place]) 03/19/2019  . Bilateral pneumonia 03/11/2019  . Acute DVT (deep venous thrombosis) ([place]) 02/17/2019  . DVT of deep femoral vein, right ([facility]) 11/19/2018    Orientation RESPIRATION BLADDER Height & Weight     Self, Place  O2(Nasal Cannula 3L) Continent  Weight: 289 lb (131.1 kg) Height:  5\' 7"  (170.2 cm)  BEHAVIORAL SYMPTOMS/MOOD NEUROLOGICAL BOWEL NUTRITION STATUS      Incontinent Diet(2 gram sodium diet, thin liquids)  AMBULATORY STATUS COMMUNICATION OF NEEDS Skin   Limited Assist Verbally Normal                       Personal Care Assistance Level of Assistance  Bathing, Feeding, Dressing Bathing Assistance: Limited assistance Feeding assistance: Independent Dressing Assistance: Limited assistance     Functional Limitations Info  Sight, Hearing, Speech Sight Info: Adequate Hearing Info: Adequate Speech Info: Adequate    SPECIAL CARE FACTORS FREQUENCY  PT (By licensed PT), OT (By licensed OT)     PT Frequency: 5x OT Frequency: 5x            Contractures Contractures Info: Not present    Additional Factors Info  Code Status, Allergies Code Status Info: Full Code Allergies Info: Amoxicillin           Current Medications (12/27/2019):  This is the current hospital active medication list Current Facility-Administered Medications  Medication Dose Route Frequency Provider Last Rate Last Admin  . 0.9 %  sodium chloride infusion  250 mL Intravenous PRN Agbata, Tochukwu, MD      . 0.9 %  sodium chloride infusion   Intravenous Continuous Wyvonnia Dusky, MD 50 mL/hr at 12/27/19 0010 New Bag at 12/27/19 0010  . acetaminophen (TYLENOL) tablet 650 mg  650 mg Oral Q6H PRN Agbata, Tochukwu, MD       Or  .  acetaminophen (TYLENOL) suppository 650 mg  650 mg Rectal Q6H PRN Agbata, Tochukwu, MD      . amiodarone (PACERONE) tablet 200 mg  200 mg Oral Daily Maryelizabeth Kaufmann, NP   200 mg at 12/27/19 0942  . bisacodyl (DULCOLAX) EC tablet 5 mg  5 mg Oral Daily PRN Charise Killian, MD      . carvedilol (COREG) tablet 3.125 mg  3.125 mg Oral BID WC Maryelizabeth Kaufmann, NP   3.125 mg at 12/27/19 0942  . diltiazem (CARDIZEM CD) 24 hr capsule 120 mg  120 mg Oral Daily Maryelizabeth Kaufmann, NP   120 mg at 12/27/19 0942  . docusate  sodium (COLACE) capsule 200 mg  200 mg Oral BID Charise Killian, MD   200 mg at 12/27/19 7341  . guaiFENesin (MUCINEX) 12 hr tablet 1,200 mg  1,200 mg Oral BID Charise Killian, MD   1,200 mg at 12/27/19 9379  . ipratropium-albuterol (DUONEB) 0.5-2.5 (3) MG/3ML nebulizer solution 3 mL  3 mL Nebulization Q6H Charise Killian, MD   3 mL at 12/27/19 0754  . methylPREDNISolone sodium succinate (SOLU-MEDROL) 40 mg/mL injection 40 mg  40 mg Intravenous Q8H Charise Killian, MD   40 mg at 12/27/19 0515  . nicotine (NICODERM CQ - dosed in mg/24 hours) patch 14 mg  14 mg Transdermal Daily Agbata, Tochukwu, MD   14 mg at 12/27/19 0945  . ondansetron (ZOFRAN) tablet 4 mg  4 mg Oral Q6H PRN Agbata, Tochukwu, MD       Or  . ondansetron (ZOFRAN) injection 4 mg  4 mg Intravenous Q6H PRN Agbata, Tochukwu, MD      . Rivaroxaban (XARELTO) tablet 15 mg  15 mg Oral BID WC Duncan, Asajah R, RPH   15 mg at 12/27/19 0943   Followed by  . [START ON 01/15/2020] rivaroxaban (XARELTO) tablet 20 mg  20 mg Oral Q supper Katha Cabal, RPH      . sodium chloride flush (NS) 0.9 % injection 3 mL  3 mL Intravenous Q12H Agbata, Tochukwu, MD   3 mL at 12/27/19 0943  . sodium chloride flush (NS) 0.9 % injection 3 mL  3 mL Intravenous PRN Agbata, Tochukwu, MD   3 mL at 12/24/19 0518  . spironolactone (ALDACTONE) tablet 12.5 mg  12.5 mg Oral Daily Maryelizabeth Kaufmann, NP         Discharge Medications: Please see discharge summary for a list of discharge medications.  Relevant Imaging Results:  Relevant Lab Results:   Additional Information SSN:[id number]  Reuel Boom Bryker Fletchall, LCSW

## 2019-12-27 NOTE — Progress Notes (Addendum)
2030- Pt woke up agitated and confused. States he went to sleep one direction and now his bed is moved. He wants to go home. Reassured pt he is safe and explained evening poc.   Emotional and tearful throughout shift. Assisted pt to eat and urinate. Pt took medication after some questions.   Intermittently incontinent over night. Pt says he doesn't know when he urinates once he's asleep but does not report being incontinent at home.

## 2019-12-27 NOTE — Plan of Care (Signed)

## 2019-12-27 NOTE — Progress Notes (Signed)
PROGRESS NOTE    Matthew [name]  DPO:[id number] DOB: 01-09-1971 DOA: 12/22/2019 PCP: Rolm Gala, NP      Assessment & Plan:   Principal Problem:   Pulmonary embolism (HCC) Active Problems:   COPD with acute exacerbation (HCC)   Class 3 severe obesity without serious comorbidity with body mass index (BMI) of 40.0 to 44.9 in adult [facility])   Acute on chronic respiratory failure with hypoxia (HCC)   Atrial fibrillation with RVR (HCC)   Acute pulmonary embolism (HCC)   Non compliance w medication regimen   Acute pulmonary embolism: CT angiogram of the chest which was done on 12/20/18 and it showed occlusion of the right middle lobe pulmonary artery at its origin, with a small amount of thrombus extending into the right lower lobar artery. Markedly enlarged main pulmonary artery, consistent with pulmonary hypertension. CT scan findings were discussed with patient on 12/20/19 & heparin drip was recommended but he declined and signed out AGAINST MEDICAL ADVICE stating that he would continue to take his Xarelto at home. Patient admits to noncompliance with medication. Echo shows LV 45-50% w/ LV global hypokinesis, RV systolic function is decreased, no shunts detected. Hx of IVC filter. D/c IV heparin drip and continue on xarelto. No acute indication for surgical procedure as per vascular surg. Vascular surg recs apprec   Acute hypoxic respiratory failure: multifactorial related to acute COPD exacerbation, RSV, as well as acute PE. Continue on supplemental oxygen and wean as tolerated, currently 3L [place]   COPD with acute exacerbation: continue on bronchodilators. Completed abx course. Continue on IV steroids taper. Encourage incentive spirometry & flutter valve  Encephalopathy: intermittent. Etiology unclear, possibly secondary delirium vs hypercarbia.   RSV: continue w/ IV steroids, bronchodilators & supportive care. Continue on supplemental oxygen and wean as tolerated.   Atrial  fibrillation: w/ RVR. PAF vs chronic. D/c IV cardizem drip and continue po cardizem & carvedilol as per cardio. Continue on xarelto. Cardio following and recs apprec  Acute systolic CHF: continue on coreg as per cardio  Hyperkalemia: resolved. Will continue to monitor   Morbid obesity: BMI 44.3. Complicates overall prognosis and care  Generalized weakness: PT recs SNF   Thrombocytopenia: etiology unclear. Will continue to monitor    DVT prophylaxis: xarelto  Code Status: full  Family Communication: called pt's wife but no answer so I left a message Disposition Plan: PT recs SNF but unlikely pt will go to SNF  Status is: Inpatient  Remains inpatient appropriate because:IV treatments appropriate due to intensity of illness or inability to take PO   Dispo: The patient is from: Home              Anticipated d/c is to: SNF but pt does not have insurance               Anticipated d/c date is: 2 days              Patient currently is not medically stable to d/c.       Consultants:   Vascular surgery   cardio   Procedures:    Antimicrobials:    Subjective: Pt c/o malaise & shortness of breath  Objective: Vitals:   12/26/19 1932 12/26/19 2002 12/27/19 0058 12/27/19 0518  BP:  131/89  122/63  Pulse:  86 85 68  Resp:  20  19  Temp:  98.4 F (36.9 C)  97.6 F (36.4 C)  TempSrc:    Oral  SpO2: 95% 93% 92% 94%  Weight:    131.1 kg  Height:        Intake/Output Summary (Last 24 hours) at 12/27/2019 0742 Last data filed at 12/27/2019 0400 Gross per 24 hour  Intake 809.4 ml  Output --  Net 809.4 ml   Filed Weights   12/25/19 0436 12/26/19 0306 12/27/19 0518  Weight: 131 kg 131.5 kg 131.1 kg    Examination:  General exam: Appears calm and comfortable. Morbidly obese Respiratory system: course breath sounds b/l. Wheezes b/l  Cardiovascular system: S1 & S2 +. No  rubs, gallops or clicks. B/l LE edema  Gastrointestinal system: Abdomen is obese, soft and  nontender. Hypoactive bowel sounds heard. Central nervous system: Awake & alert. Moves all 4 extremities. Psychiatry: Judgement and insight appear abnormal.    Data Reviewed: I have personally reviewed following labs and imaging studies  CBC: Recent Labs  Lab 12/22/19 1136 12/22/19 1136 12/23/19 0414 12/24/19 0507 12/25/19 0452 12/26/19 0458 12/27/19 0532  WBC 7.8   < > 6.2 8.7 11.6* 8.7 8.6  NEUTROABS 3.5  --   --   --   --   --   --   HGB 16.0   < > 15.9 16.2 15.7 15.6 17.4*  HCT 48.5   < > 48.0 50.5 49.2 48.4 53.6*  MCV 100.2*   < > 100.6* 101.8* 103.6* 103.0* 101.7*  PLT 134*   < > 146* 131* 135* 138* 111*   < > = values in this interval not displayed.   Basic Metabolic Panel: Recent Labs  Lab 12/23/19 0414 12/23/19 0414 12/24/19 0507 12/24/19 1935 12/25/19 0452 12/26/19 0458 12/27/19 0532  NA 136  --  134*  --  137 136 137  K 4.8   < > 5.7* 5.1 5.2* 5.0 4.9  CL 96*  --  95*  --  98 95* 94*  CO2 32  --  33*  --  34* 35* 37*  GLUCOSE 185*  --  164*  --  147* 138* 123*  BUN 16  --  28*  --  28* 30* 26*  CREATININE 0.86  --  0.90  --  0.88 0.79 0.80  CALCIUM 8.3*  --  8.5*  --  8.7* 8.7* 8.7*   < > = values in this interval not displayed.   GFR: Estimated Creatinine Clearance: 145.5 mL/min (by C-G formula based on SCr of 0.8 mg/dL). Liver Function Tests: No results for input(s): AST, ALT, ALKPHOS, BILITOT, PROT, ALBUMIN in the last 168 hours. No results for input(s): LIPASE, AMYLASE in the last 168 hours. No results for input(s): AMMONIA in the last 168 hours. Coagulation Profile: Recent Labs  Lab 12/22/19 1136  INR 2.0*   Cardiac Enzymes: No results for input(s): CKTOTAL, CKMB, CKMBINDEX, TROPONINI in the last 168 hours. BNP (last 3 results) No results for input(s): PROBNP in the last 8760 hours. HbA1C: No results for input(s): HGBA1C in the last 72 hours. CBG: No results for input(s): GLUCAP in the last 168 hours. Lipid Profile: No results for  input(s): CHOL, HDL, LDLCALC, TRIG, CHOLHDL, LDLDIRECT in the last 72 hours. Thyroid Function Tests: No results for input(s): TSH, T4TOTAL, FREET4, T3FREE, THYROIDAB in the last 72 hours. Anemia Panel: No results for input(s): VITAMINB12, FOLATE, FERRITIN, TIBC, IRON, RETICCTPCT in the last 72 hours. Sepsis Labs: No results for input(s): PROCALCITON, LATICACIDVEN in the last 168 hours.  Recent Results (from the past 240 hour(s))  Culture, blood (Routine x 2)     Status: None  Collection Time: 12/19/19  7:35 PM   Specimen: BLOOD  Result Value Ref Range Status   Specimen Description BLOOD RIGHT ANTECUBITAL  Final   Special Requests   Final    BOTTLES DRAWN AEROBIC AND ANAEROBIC Blood Culture results may not be optimal due to an excessive volume of blood received in culture bottles   Culture   Final    NO GROWTH 5 DAYS Performed at [facility], [address], [place], [place] [address]    Report Status 12/24/2019 FINAL  Final  Culture, blood (Routine x 2)     Status: None   Collection Time: 12/19/19  7:36 PM   Specimen: BLOOD  Result Value Ref Range Status   Specimen Description BLOOD BLOOD RIGHT HAND  Final   Special Requests   Final    BOTTLES DRAWN AEROBIC AND ANAEROBIC Blood Culture adequate volume   Culture   Final    NO GROWTH 5 DAYS Performed at [facility], [address] [place], [place] [address]    Report Status 12/24/2019 FINAL  Final  Respiratory Panel by RT PCR (Flu A&B, Covid) - Nasopharyngeal Swab     Status: None   Collection Time: 12/19/19 11:26 PM   Specimen: Nasopharyngeal Swab  Result Value Ref Range Status   SARS Coronavirus 2 by RT PCR NEGATIVE NEGATIVE Final    Comment: (NOTE) SARS-CoV-2 target nucleic acids are NOT DETECTED. The SARS-CoV-2 RNA is generally detectable in upper respiratoy specimens during the acute phase of infection. The lowest concentration of SARS-CoV-2 viral copies this assay can detect is 131 copies/mL. A  negative result does not preclude SARS-Cov-2 infection and should not be used as the sole basis for treatment or other patient management decisions. A negative result may occur with  improper specimen collection/handling, submission of specimen other than nasopharyngeal swab, presence of viral mutation(s) within the areas targeted by this assay, and inadequate number of viral copies (<131 copies/mL). A negative result must be combined with clinical observations, patient history, and epidemiological information. The expected result is Negative. Fact Sheet for Patients:  https://www.moore.com/ Fact Sheet for Healthcare Providers:  https://www.young.biz/ This test is not yet ap proved or cleared by the [place] FDA and  has been authorized for detection and/or diagnosis of SARS-CoV-2 by FDA under an Emergency Use Authorization (EUA). This EUA will remain  in effect (meaning this test can be used) for the duration of the COVID-19 declaration under Section 564(b)(1) of the Act, 21 U.S.C. section 360bbb-3(b)(1), unless the authorization is terminated or revoked sooner.    Influenza A by PCR NEGATIVE NEGATIVE Final   Influenza B by PCR NEGATIVE NEGATIVE Final    Comment: (NOTE) The Xpert Xpress SARS-CoV-2/FLU/RSV assay is intended as an aid in  the diagnosis of influenza from Nasopharyngeal swab specimens and  should not be used as a sole basis for treatment. Nasal washings and  aspirates are unacceptable for Xpert Xpress SARS-CoV-2/FLU/RSV  testing. Fact Sheet for Patients: https://www.moore.com/ Fact Sheet for Healthcare Providers: https://www.young.biz/ This test is not yet approved or cleared by the [place] FDA and  has been authorized for detection and/or diagnosis of SARS-CoV-2 by  FDA under an Emergency Use Authorization (EUA). This EUA will remain  in effect (meaning this test can be used) for  the duration of the  Covid-19 declaration under Section 564(b)(1) of the Act, 21  U.S.C. section 360bbb-3(b)(1), unless the authorization is  terminated or revoked. Performed at [facility], [address] [place], [place] [address]   Respiratory Panel by RT PCR (Flu A&B, Covid) - Nasopharyngeal Swab     Status: None   Collection Time: 12/22/19 11:36 AM   Specimen: Nasopharyngeal Swab  Result Value Ref Range Status   SARS Coronavirus 2 by RT PCR NEGATIVE NEGATIVE Final    Comment: (NOTE) SARS-CoV-2 target nucleic acids are NOT DETECTED. The SARS-CoV-2 RNA is generally detectable in upper respiratoy specimens during the acute phase of infection. The lowest concentration of SARS-CoV-2 viral copies this assay can detect is 131 copies/mL. A negative result does not preclude SARS-Cov-2 infection and should not be used as the sole basis for treatment or other patient management decisions. A negative result may occur with  improper specimen collection/handling, submission of specimen other than nasopharyngeal swab, presence of viral mutation(s) within the areas targeted by this assay, and inadequate number of viral copies (<131 copies/mL). A negative result must be combined with clinical observations, patient history, and epidemiological information. The expected result is Negative. Fact Sheet for Patients:  https://www.moore.com/https://www.fda.gov/media/142436/download Fact Sheet for Healthcare Providers:  https://www.young.biz/https://www.fda.gov/media/142435/download This test is not yet ap proved or cleared by the [place] FDA and  has been authorized for detection and/or diagnosis of SARS-CoV-2 by FDA under an Emergency Use Authorization (EUA). This EUA will remain  in effect (meaning this test can be used) for the duration of the COVID-19 declaration under Section 564(b)(1) of the Act, 21 U.S.C. section 360bbb-3(b)(1), unless the authorization is terminated or revoked sooner.    Influenza A by PCR  NEGATIVE NEGATIVE Final   Influenza B by PCR NEGATIVE NEGATIVE Final    Comment: (NOTE) The Xpert Xpress SARS-CoV-2/FLU/RSV assay is intended as an aid in  the diagnosis of influenza from Nasopharyngeal swab specimens and  should not be used as a sole basis for treatment. Nasal washings and  aspirates are unacceptable for Xpert Xpress SARS-CoV-2/FLU/RSV  testing. Fact Sheet for Patients: https://www.moore.com/https://www.fda.gov/media/142436/download Fact Sheet for Healthcare Providers: https://www.young.biz/https://www.fda.gov/media/142435/download This test is not yet approved or cleared by the [place] FDA and  has been authorized for detection and/or diagnosis of SARS-CoV-2 by  FDA under an Emergency Use Authorization (EUA). This EUA will remain  in effect (meaning this test can be used) for the duration of the  Covid-19 declaration under Section 564(b)(1) of the Act, 21  U.S.C. section 360bbb-3(b)(1), unless the authorization is  terminated or revoked. Performed at [facility], [address] [place], [place] [address]   Resp Panel by RT PCR (RSV, Flu A&B, Covid) -     Status: Abnormal   Collection Time: 12/22/19 11:36 AM  Result Value Ref Range Status   SARS Coronavirus 2 by RT PCR NEGATIVE NEGATIVE Final    Comment: (NOTE) SARS-CoV-2 target nucleic acids are NOT DETECTED. The SARS-CoV-2 RNA is generally detectable in upper respiratoy specimens during the acute phase of infection. The lowest concentration of SARS-CoV-2 viral copies this assay can detect is 131 copies/mL. A negative result does not preclude SARS-Cov-2 infection and should not be used as the sole basis for treatment or other patient management decisions. A negative result may occur with  improper specimen collection/handling, submission of specimen other than nasopharyngeal swab, presence of viral mutation(s) within the areas targeted by this assay, and inadequate number of viral copies (<131 copies/mL). A negative result must be  combined with clinical observations, patient history, and epidemiological information. The expected result is Negative. Fact Sheet for Patients:  https://www.moore.com/https://www.fda.gov/media/142436/download Fact Sheet for Healthcare Providers:  https://www.young.biz/https://www.fda.gov/media/142435/download  This test is not yet ap proved or cleared by the [place] and  has been authorized for detection and/or diagnosis of SARS-CoV-2 by FDA under an Emergency Use Authorization (EUA). This EUA will remain  in effect (meaning this test can be used) for the duration of the COVID-19 declaration under Section 564(b)(1) of the Act, 21 U.S.C. section 360bbb-3(b)(1), unless the authorization is terminated or revoked sooner.    Influenza A by PCR NEGATIVE NEGATIVE Final   Influenza B by PCR NEGATIVE NEGATIVE Final    Comment: (NOTE) The Xpert Xpress SARS-CoV-2/FLU/RSV assay is intended as an aid in  the diagnosis of influenza from Nasopharyngeal swab specimens and  should not be used as a sole basis for treatment. Nasal washings and  aspirates are unacceptable for Xpert Xpress SARS-CoV-2/FLU/RSV  testing. Fact Sheet for Patients: https://www.moore.com/ Fact Sheet for Healthcare Providers: https://www.young.biz/ This test is not yet approved or cleared by the [place] FDA and  has been authorized for detection and/or diagnosis of SARS-CoV-2 by  FDA under an Emergency Use Authorization (EUA). This EUA will remain  in effect (meaning this test can be used) for the duration of the  Covid-19 declaration under Section 564(b)(1) of the Act, 21  U.S.C. section 360bbb-3(b)(1), unless the authorization is  terminated or revoked.    Respiratory Syncytial Virus by PCR POSITIVE (A) NEGATIVE Final    Comment: (NOTE) Fact Sheet for Patients: https://www.moore.com/ Fact Sheet for Healthcare Providers: https://www.young.biz/ This test is not yet approved  or cleared by the [place] FDA and  has been authorized for detection and/or diagnosis of SARS-CoV-2 by  FDA under an Emergency Use Authorization (EUA). This EUA will remain  in effect (meaning this test can be used) for the duration of the  COVID-19 declaration under Section 564(b)(1) of the Act, 21 U.S.C.  section 360bbb-3(b)(1), unless the authorization is terminated or  revoked. Performed at [facility], [address], [place], [place] [address]   Urine Culture     Status: None   Collection Time: 12/24/19 10:00 PM   Specimen: Urine, Random  Result Value Ref Range Status   Specimen Description   Final    URINE, RANDOM Performed at [facility], [address], [place], [place] [address]    Special Requests   Final    NONE Performed at [facility], [address], [place], [place] [address]    Culture   Final    NO GROWTH Performed at [facility], [address] [place], [place] [address]    Report Status 12/26/2019 FINAL  Final         Radiology Studies: No results found.      Scheduled Meds: . amiodarone  200 mg Oral Daily  . carvedilol  3.125 mg Oral BID WC  . diltiazem  180 mg Oral Daily  . docusate sodium  200 mg Oral BID  . guaiFENesin  1,200 mg Oral BID  . ipratropium-albuterol  3 mL Nebulization Q6H  . methylPREDNISolone (SOLU-MEDROL) injection  40 mg Intravenous Q8H  . nicotine  14 mg Transdermal Daily  . Rivaroxaban  15 mg Oral BID WC   Followed by  . [START ON 01/15/2020] rivaroxaban  20 mg Oral Q supper  . sodium chloride flush  3 mL Intravenous Q12H   Continuous Infusions: . sodium chloride    . sodium chloride 50 mL/hr at 12/27/19 0010     LOS: 5 days    Time spent:  30 mins     Wyvonnia Dusky, MD [facility] Pager [phone]  If 7PM-7AM, please contact night-coverage [email or web address] 12/27/2019, 7:42 AM

## 2019-12-27 NOTE — Progress Notes (Signed)
Cpap on sb. Patient refused to use. Stated he needs to talk to his wife. RN will call RT if patient changes mind

## 2019-12-27 NOTE — Progress Notes (Signed)
OT Cancellation Note  Patient Details Name: Matthew [name] MRN: [id number] DOB: 29-Aug-1971   Cancelled Treatment:    Reason Eval/Treat Not Completed: Medical issues which prohibited therapy. Per conversation c RN, pt recently received Ativan and is not appropriate for therapy this AM. Will follow acutely and initiate services as appropriate.   Kathie Dike, M.S. OTR/L  12/27/19, 11:38 AM

## 2019-12-27 NOTE — Progress Notes (Addendum)
Went in to check patient's oxygen level, SPO2 82% on R/A patient is refusing to wear oxygen, he states he wants to leave. Patient is threaning to leave AMA. Pt is very anxious and combative cursing at staff. Pt became tearful yells" I want to see my wife, I am going to leave" Dr. Mayford Knife notified. Per Md will order ativan. Will administer and continue to monitor.

## 2019-12-27 NOTE — Progress Notes (Addendum)
SUBJECTIVE: Patient denies shortness of breath or chest pain.  Patient continues to be frustrated about requiring to remain in the hospital.    Vitals:   12/26/19 2002 12/27/19 0058 12/27/19 0518 12/27/19 0748  BP: 131/89  122/63 114/71  Pulse: 86 85 68 (!) 48  Resp: 20  19 17   Temp: 98.4 F (36.9 C)  97.6 F (36.4 C) 98.3 F (36.8 C)  TempSrc:   Oral Oral  SpO2: 93% 92% 94% 98%  Weight:   131.1 kg   Height:        Intake/Output Summary (Last 24 hours) at 12/27/2019 1008 Last data filed at 12/27/2019 0908 Gross per 24 hour  Intake 449.4 ml  Output 300 ml  Net 149.4 ml    LABS: Basic Metabolic Panel: Recent Labs    12/26/19 0458 12/27/19 0532  NA 136 137  K 5.0 4.9  CL 95* 94*  CO2 35* 37*  GLUCOSE 138* 123*  BUN 30* 26*  CREATININE 0.79 0.80  CALCIUM 8.7* 8.7*   Liver Function Tests: No results for input(s): AST, ALT, ALKPHOS, BILITOT, PROT, ALBUMIN in the last 72 hours. No results for input(s): LIPASE, AMYLASE in the last 72 hours. CBC: Recent Labs    12/26/19 0458 12/27/19 0532  WBC 8.7 8.6  HGB 15.6 17.4*  HCT 48.4 53.6*  MCV 103.0* 101.7*  PLT 138* 111*   Cardiac Enzymes: No results for input(s): CKTOTAL, CKMB, CKMBINDEX, TROPONINI in the last 72 hours. BNP: Invalid input(s): POCBNP D-Dimer: No results for input(s): DDIMER in the last 72 hours. Hemoglobin A1C: No results for input(s): HGBA1C in the last 72 hours. Fasting Lipid Panel: No results for input(s): CHOL, HDL, LDLCALC, TRIG, CHOLHDL, LDLDIRECT in the last 72 hours. Thyroid Function Tests: No results for input(s): TSH, T4TOTAL, T3FREE, THYROIDAB in the last 72 hours.  Invalid input(s): FREET3 Anemia Panel: No results for input(s): VITAMINB12, FOLATE, FERRITIN, TIBC, IRON, RETICCTPCT in the last 72 hours.   PHYSICAL EXAM General: Well developed, well nourished, in no acute distress HEENT:  Normocephalic and atramatic Neck:  No JVD.  Lungs: Expiratory wheezes throughout Heart: HRRR  . Normal S1 and S2 without gallops or murmurs.  Abdomen: Bowel sounds are positive, abdomen soft and non-tender  Msk:  Back normal, normal gait. Normal strength and tone for age. Extremities: No clubbing, cyanosis or edema.   Neuro: Alert and oriented X 3. Psych:  Good affect, responds appropriately  TELEMETRY: A fib. 70s/bpm  ASSESSMENT AND PLAN: Patient presenting to the emergency department with shortness of breath after recently signing out [place] with a diagnosis of a pulmonary embolus. Patient continues to be in atrial fibrillation. Patient has failed conversion therapy but remains rate controlled and therefore will decrease amiodarone to 200 mg daily.    Diltiazem dose will be decreased to 120 mg daily today and continue on low-dose beta-blocker therapy.  With the patient's history of noncompliance, continued HFrEF therapy in the outpatient setting may be limited and therefore patient will be started on low dose spironolactone today.  Please continue to monitor his potassium as he has previously been on the higher side of normal. Plan to continue his daily Xarelto. Patient does not appear to be of any sort of respiratory distress but is still requiring 3 L nasal cannula and lung sounds with continued wheezing .   From a cardiac point of view the patient is ready for discharge, but with his continued respiratory status and oxygen requirement unsure if he is ready  from pulmonary standpoint.  We will continue to manage his HFrEF while he is hospitalized with the hope of further management in the outpatient setting.  Principal Problem:   Pulmonary embolism (HCC) Active Problems:   COPD with acute exacerbation (HCC)   Class 3 severe obesity without serious comorbidity with body mass index (BMI) of 40.0 to 44.9 in adult [facility])   Acute on chronic respiratory failure with hypoxia (HCC)   Atrial fibrillation with RVR (HCC)   Acute pulmonary embolism (HCC)   Non compliance w medication regimen     Matthew Kaufmann, NP-C 12/27/2019 10:08 AM

## 2019-12-28 LAB — CBC
HCT: 50.4 % (ref 39.0–52.0)
Hemoglobin: 17.1 g/dL — ABNORMAL HIGH (ref 13.0–17.0)
MCH: 33.2 pg (ref 26.0–34.0)
MCHC: 33.9 g/dL (ref 30.0–36.0)
MCV: 97.9 fL (ref 80.0–100.0)
Platelets: 130 10*3/uL — ABNORMAL LOW (ref 150–400)
RBC: 5.15 MIL/uL (ref 4.22–5.81)
RDW: 14.2 % (ref 11.5–15.5)
WBC: 8.8 10*3/uL (ref 4.0–10.5)
nRBC: 0 % (ref 0.0–0.2)

## 2019-12-28 LAB — BASIC METABOLIC PANEL
Anion gap: 8 (ref 5–15)
BUN: 25 mg/dL — ABNORMAL HIGH (ref 6–20)
CO2: 36 mmol/L — ABNORMAL HIGH (ref 22–32)
Calcium: 8.5 mg/dL — ABNORMAL LOW (ref 8.9–10.3)
Chloride: 91 mmol/L — ABNORMAL LOW (ref 98–111)
Creatinine, Ser: 0.72 mg/dL (ref 0.61–1.24)
GFR calc Af Amer: >60 mL/min (ref >60)
GFR calc non Af Amer: >60 mL/min (ref >60)
Glucose, Bld: 124 mg/dL — ABNORMAL HIGH (ref 70–99)
Potassium: 4.8 mmol/L (ref 3.5–5.1)
Sodium: 135 mmol/L (ref 135–145)

## 2019-12-28 MED ORDER — CARVEDILOL 6.25 MG PO TABS
6.2500 mg | ORAL_TABLET | Freq: Two times a day (BID) | ORAL | Status: DC
  Administered 2019-12-28 – 2019-12-29 (×2): 6.25 mg via ORAL
  Filled 2019-12-28 (×2): qty 1

## 2019-12-28 MED ORDER — LORAZEPAM 2 MG/ML IJ SOLN
0.5000 mg | Freq: Two times a day (BID) | INTRAMUSCULAR | Status: DC | PRN
  Administered 2019-12-28: 0.5 mg via INTRAVENOUS
  Filled 2019-12-28: qty 1

## 2019-12-28 NOTE — Progress Notes (Signed)
SUBJECTIVE: Patient denies shortness of breath or chest pain.  Patient again is frustrated about not being able to go home yet.   Vitals:   12/27/19 2029 12/28/19 0419 12/28/19 0745 12/28/19 0751  BP:  135/82  110/90  Pulse:  65  73  Resp:    18  Temp:    97.8 F (36.6 C)  TempSrc:    Oral  SpO2: 93% 100% 99% 96%  Weight:  128.4 kg    Height:        Intake/Output Summary (Last 24 hours) at 12/28/2019 1006 Last data filed at 12/28/2019 0816 Gross per 24 hour  Intake 1098.97 ml  Output 1675 ml  Net -576.03 ml    LABS: Basic Metabolic Panel: Recent Labs    12/27/19 0532 12/28/19 0754  NA 137 135  K 4.9 4.8  CL 94* 91*  CO2 37* 36*  GLUCOSE 123* 124*  BUN 26* 25*  CREATININE 0.80 0.72  CALCIUM 8.7* 8.5*   Liver Function Tests: No results for input(s): AST, ALT, ALKPHOS, BILITOT, PROT, ALBUMIN in the last 72 hours. No results for input(s): LIPASE, AMYLASE in the last 72 hours. CBC: Recent Labs    12/27/19 0532 12/28/19 0754  WBC 8.6 8.8  HGB 17.4* 17.1*  HCT 53.6* 50.4  MCV 101.7* 97.9  PLT 111* 130*   Cardiac Enzymes: No results for input(s): CKTOTAL, CKMB, CKMBINDEX, TROPONINI in the last 72 hours. BNP: Invalid input(s): POCBNP D-Dimer: No results for input(s): DDIMER in the last 72 hours. Hemoglobin A1C: No results for input(s): HGBA1C in the last 72 hours. Fasting Lipid Panel: No results for input(s): CHOL, HDL, LDLCALC, TRIG, CHOLHDL, LDLDIRECT in the last 72 hours. Thyroid Function Tests: No results for input(s): TSH, T4TOTAL, T3FREE, THYROIDAB in the last 72 hours.  Invalid input(s): FREET3 Anemia Panel: No results for input(s): VITAMINB12, FOLATE, FERRITIN, TIBC, IRON, RETICCTPCT in the last 72 hours.   PHYSICAL EXAM General: Well developed, well nourished, in no acute distress HEENT:  Normocephalic and atramatic Neck:  No JVD.  Lungs: Scattered expiratory wheezes Heart: HRRR . Normal S1 and S2 without gallops or murmurs.  Abdomen: Bowel  sounds are positive, abdomen soft and non-tender  Msk:  Back normal, normal gait. Normal strength and tone for age. Extremities: No clubbing, cyanosis or edema.   Neuro: Alert and oriented X 3. Psych:  Good affect, responds appropriately  TELEMETRY: Atrial fibrillation.  90s/bpm  ASSESSMENT AND PLAN: Patient presenting to the emergency department with shortness of breath after recently signing out [place] with a diagnosis of a pulmonary embolus. Patient continues to be in atrial fibrillation.Patient remains rate controlled and therefore will continue amiodarone to 200 mg daily.     Please continue diltiazem at 120 mg daily and will increase carvedilol today.    Please continue spironolactone at 12.5 mg, potassium is stable.  Plan tocontinue his daily Xarelto. It was last charted the patient was on 1 L nasal cannula.  When assessing the patient he was on room air and doing well.  Patient's lung sounds have improved today with only scattered expiratory wheezes. From a cardiac point of view the patient is ready for discharge.  Patient's HFrEF will be managed in the outpatient setting.  If discharged we will see the patient in the office on 4/29 at 10:30 AM.  Principal Problem:   Pulmonary embolism (HCC) Active Problems:   COPD with acute exacerbation (HCC)   Class 3 severe obesity without serious comorbidity with body mass index (  BMI) of 40.0 to 44.9 in adult [facility])   Acute on chronic respiratory failure with hypoxia (HCC)   Atrial fibrillation with RVR (HCC)   Acute pulmonary embolism (HCC)   Non compliance w medication regimen    Maryelizabeth Kaufmann, NP-C 12/28/2019 10:06 AM

## 2019-12-28 NOTE — Progress Notes (Addendum)
Pt more agreeable tonight, states he is going home tomorrow. Still refusing oxygen, intermittently refusing interventions/medications. Education provided/reinforced.  Pt refusing nasal cannula but willing to wear cpap overnight.

## 2019-12-28 NOTE — Progress Notes (Signed)
PROGRESS NOTE    Matthew [name]  XLK:[id number] DOB: May 13, 1971 DOA: 12/22/2019 PCP: Langston Reusing, NP      Assessment & Plan:   Principal Problem:   Pulmonary embolism ([place]) Active Problems:   COPD with acute exacerbation ([place])   Class 3 severe obesity without serious comorbidity with body mass index (BMI) of 40.0 to 44.9 in adult [facility])   Acute on chronic respiratory failure with hypoxia (HCC)   Atrial fibrillation with RVR (HCC)   Acute pulmonary embolism (HCC)   Non compliance w medication regimen   Acute pulmonary embolism: as per CT angiogram of the chest which was done on 12/20/18. Markedly enlarged main pulmonary artery, consistent with pulmonary hypertension. Pt signed out [place] stating that he would continue to take his Xarelto at home. Patient admits to noncompliance with medication. Echo shows LV 45-50% w/ LV global hypokinesis, RV systolic function is decreased, no shunts detected. Hx of IVC filter. D/c IV heparin drip and continue on xarelto. No acute indication for surgical procedure as per vascular surg. Vascular surg recs apprec   Acute hypoxic & hypercarbic respiratory failure: multifactorial related to acute COPD exacerbation, RSV, as well as acute PE. Continue on supplemental oxygen and wean as tolerated. Continue on xarelto.Will likely need to be d/c w/ oxygen. DME oxygen orders placed   COPD with acute exacerbation: continue on bronchodilators & IV steroids. Completed abx course. Encourage incentive spirometry & flutter valve  Encephalopathy: intermittent. Etiology unclear, possibly secondary delirium vs hypercarbia.   RSV: continue w/ IV steroids, bronchodilators & supportive care. Continue on supplemental oxygen and wean as tolerated   Atrial fibrillation: w/ RVR. PAF vs chronic. D/c IV cardizem drip and continue po cardizem & carvedilol as per cardio. Continue on xarelto. Cardio following and recs apprec  Acute systolic CHF:  continue on coreg, spironolactone as per cardio  Hyperkalemia: resolved. Will continue to monitor   Morbid obesity: BMI 44.3. Complicates overall prognosis and care  Generalized weakness: PT recs SNF but pt does not have insurance   Thrombocytopenia: etiology unclear. Will continue to monitor    DVT prophylaxis: xarelto  Code Status: full  Family Communication: discussed pt's care w/ pt's wife, [name] and answered her questions. [name] is unable to care for the pt at home and neither is her son as he has one paralyzed arm.  Disposition Plan: PT recs SNF but pt has no insurance. Will likely need O2 at home as well. CM is working on this   Status is: Inpatient  Remains inpatient appropriate because:IV treatments appropriate due to intensity of illness or inability to take PO   Dispo: The patient is from: Home              Anticipated d/c is to: SNF but pt does not have insurance               Anticipated d/c date is: 3 days or more               Patient currently is not medically stable to d/c.       Consultants:   Vascular surgery   cardio   Procedures:    Antimicrobials:    Subjective: Pt is tearful, agitated & frustrated   Objective: Vitals:   12/28/19 0419 12/28/19 0745 12/28/19 0751 12/28/19 1200  BP: 135/82  110/90 127/82  Pulse: 65  73 66  Resp:   18 17  Temp:   97.8 F (36.6 C) 97.9 F (36.6  C)  TempSrc:   Oral   SpO2: 100% 99% 96% 93%  Weight: 128.4 kg     Height:        Intake/Output Summary (Last 24 hours) at 12/28/2019 1239 Last data filed at 12/28/2019 1205 Gross per 24 hour  Intake 1439.04 ml  Output 2425 ml  Net -985.96 ml   Filed Weights   12/26/19 0306 12/27/19 0518 12/28/19 0419  Weight: 131.5 kg 131.1 kg 128.4 kg    Examination:  General exam: Appears emotional. Morbidly obese Respiratory system: course breath sounds b/l. Wheezes b/l  Cardiovascular system: S1 & S2 +. No  rubs, gallops or clicks. B/l LE edema    Gastrointestinal system: Abdomen is obese, soft and nontender. Normal bowel sounds heard. Central nervous system: Awake & alert. Moves all 4 extremities. Psychiatry: Judgement and insight appear abnormal.    Data Reviewed: I have personally reviewed following labs and imaging studies  CBC: Recent Labs  Lab 12/22/19 1136 12/23/19 0414 12/24/19 0507 12/25/19 0452 12/26/19 0458 12/27/19 0532 12/28/19 0754  WBC 7.8   < > 8.7 11.6* 8.7 8.6 8.8  NEUTROABS 3.5  --   --   --   --   --   --   HGB 16.0   < > 16.2 15.7 15.6 17.4* 17.1*  HCT 48.5   < > 50.5 49.2 48.4 53.6* 50.4  MCV 100.2*   < > 101.8* 103.6* 103.0* 101.7* 97.9  PLT 134*   < > 131* 135* 138* 111* 130*   < > = values in this interval not displayed.   Basic Metabolic Panel: Recent Labs  Lab 12/24/19 0507 12/24/19 0507 12/24/19 1935 12/25/19 0452 12/26/19 0458 12/27/19 0532 12/28/19 0754  NA 134*  --   --  137 136 137 135  K 5.7*   < > 5.1 5.2* 5.0 4.9 4.8  CL 95*  --   --  98 95* 94* 91*  CO2 33*  --   --  34* 35* 37* 36*  GLUCOSE 164*  --   --  147* 138* 123* 124*  BUN 28*  --   --  28* 30* 26* 25*  CREATININE 0.90  --   --  0.88 0.79 0.80 0.72  CALCIUM 8.5*  --   --  8.7* 8.7* 8.7* 8.5*   < > = values in this interval not displayed.   GFR: Estimated Creatinine Clearance: 143.8 mL/min (by C-G formula based on SCr of 0.72 mg/dL). Liver Function Tests: No results for input(s): AST, ALT, ALKPHOS, BILITOT, PROT, ALBUMIN in the last 168 hours. No results for input(s): LIPASE, AMYLASE in the last 168 hours. No results for input(s): AMMONIA in the last 168 hours. Coagulation Profile: Recent Labs  Lab 12/22/19 1136  INR 2.0*   Cardiac Enzymes: No results for input(s): CKTOTAL, CKMB, CKMBINDEX, TROPONINI in the last 168 hours. BNP (last 3 results) No results for input(s): PROBNP in the last 8760 hours. HbA1C: No results for input(s): HGBA1C in the last 72 hours. CBG: No results for input(s): GLUCAP in the  last 168 hours. Lipid Profile: No results for input(s): CHOL, HDL, LDLCALC, TRIG, CHOLHDL, LDLDIRECT in the last 72 hours. Thyroid Function Tests: No results for input(s): TSH, T4TOTAL, FREET4, T3FREE, THYROIDAB in the last 72 hours. Anemia Panel: No results for input(s): VITAMINB12, FOLATE, FERRITIN, TIBC, IRON, RETICCTPCT in the last 72 hours. Sepsis Labs: No results for input(s): PROCALCITON, LATICACIDVEN in the last 168 hours.  Recent Results (from the past 240  hour(s))  Culture, blood (Routine x 2)     Status: None   Collection Time: 12/19/19  7:35 PM   Specimen: BLOOD  Result Value Ref Range Status   Specimen Description BLOOD RIGHT ANTECUBITAL  Final   Special Requests   Final    BOTTLES DRAWN AEROBIC AND ANAEROBIC Blood Culture results may not be optimal due to an excessive volume of blood received in culture bottles   Culture   Final    NO GROWTH 5 DAYS Performed at [facility], [address] [place], [place] [address]    Report Status 12/24/2019 FINAL  Final  Culture, blood (Routine x 2)     Status: None   Collection Time: 12/19/19  7:36 PM   Specimen: BLOOD  Result Value Ref Range Status   Specimen Description BLOOD BLOOD RIGHT HAND  Final   Special Requests   Final    BOTTLES DRAWN AEROBIC AND ANAEROBIC Blood Culture adequate volume   Culture   Final    NO GROWTH 5 DAYS Performed at [facility], [address] [place], [place] [address]    Report Status 12/24/2019 FINAL  Final  Respiratory Panel by RT PCR (Flu A&B, Covid) - Nasopharyngeal Swab     Status: None   Collection Time: 12/19/19 11:26 PM   Specimen: Nasopharyngeal Swab  Result Value Ref Range Status   SARS Coronavirus 2 by RT PCR NEGATIVE NEGATIVE Final    Comment: (NOTE) SARS-CoV-2 target nucleic acids are NOT DETECTED. The SARS-CoV-2 RNA is generally detectable in upper respiratoy specimens during the acute phase of infection. The lowest concentration of SARS-CoV-2 viral  copies this assay can detect is 131 copies/mL. A negative result does not preclude SARS-Cov-2 infection and should not be used as the sole basis for treatment or other patient management decisions. A negative result may occur with  improper specimen collection/handling, submission of specimen other than nasopharyngeal swab, presence of viral mutation(s) within the areas targeted by this assay, and inadequate number of viral copies (<131 copies/mL). A negative result must be combined with clinical observations, patient history, and epidemiological information. The expected result is Negative. Fact Sheet for Patients:  https://www.moore.com/https://www.fda.gov/media/142436/download Fact Sheet for Healthcare Providers:  https://www.young.biz/https://www.fda.gov/media/142435/download This test is not yet ap proved or cleared by the [place] FDA and  has been authorized for detection and/or diagnosis of SARS-CoV-2 by FDA under an Emergency Use Authorization (EUA). This EUA will remain  in effect (meaning this test can be used) for the duration of the COVID-19 declaration under Section 564(b)(1) of the Act, 21 U.S.C. section 360bbb-3(b)(1), unless the authorization is terminated or revoked sooner.    Influenza A by PCR NEGATIVE NEGATIVE Final   Influenza B by PCR NEGATIVE NEGATIVE Final    Comment: (NOTE) The Xpert Xpress SARS-CoV-2/FLU/RSV assay is intended as an aid in  the diagnosis of influenza from Nasopharyngeal swab specimens and  should not be used as a sole basis for treatment. Nasal washings and  aspirates are unacceptable for Xpert Xpress SARS-CoV-2/FLU/RSV  testing. Fact Sheet for Patients: https://www.moore.com/https://www.fda.gov/media/142436/download Fact Sheet for Healthcare Providers: https://www.young.biz/https://www.fda.gov/media/142435/download This test is not yet approved or cleared by the [place] FDA and  has been authorized for detection and/or diagnosis of SARS-CoV-2 by  FDA under an Emergency Use Authorization (EUA). This EUA will  remain  in effect (meaning this test can be used) for the duration of the  Covid-19 declaration under Section 564(b)(1) of the Act, 21  U.S.C. section 360bbb-3(b)(1), unless  the authorization is  terminated or revoked. Performed at [facility], [address] [place], [place] [address]   Respiratory Panel by RT PCR (Flu A&B, Covid) - Nasopharyngeal Swab     Status: None   Collection Time: 12/22/19 11:36 AM   Specimen: Nasopharyngeal Swab  Result Value Ref Range Status   SARS Coronavirus 2 by RT PCR NEGATIVE NEGATIVE Final    Comment: (NOTE) SARS-CoV-2 target nucleic acids are NOT DETECTED. The SARS-CoV-2 RNA is generally detectable in upper respiratoy specimens during the acute phase of infection. The lowest concentration of SARS-CoV-2 viral copies this assay can detect is 131 copies/mL. A negative result does not preclude SARS-Cov-2 infection and should not be used as the sole basis for treatment or other patient management decisions. A negative result may occur with  improper specimen collection/handling, submission of specimen other than nasopharyngeal swab, presence of viral mutation(s) within the areas targeted by this assay, and inadequate number of viral copies (<131 copies/mL). A negative result must be combined with clinical observations, patient history, and epidemiological information. The expected result is Negative. Fact Sheet for Patients:  https://www.moore.com/ Fact Sheet for Healthcare Providers:  https://www.young.biz/ This test is not yet ap proved or cleared by the [place] FDA and  has been authorized for detection and/or diagnosis of SARS-CoV-2 by FDA under an Emergency Use Authorization (EUA). This EUA will remain  in effect (meaning this test can be used) for the duration of the COVID-19 declaration under Section 564(b)(1) of the Act, 21 U.S.C. section 360bbb-3(b)(1), unless the authorization is  terminated or revoked sooner.    Influenza A by PCR NEGATIVE NEGATIVE Final   Influenza B by PCR NEGATIVE NEGATIVE Final    Comment: (NOTE) The Xpert Xpress SARS-CoV-2/FLU/RSV assay is intended as an aid in  the diagnosis of influenza from Nasopharyngeal swab specimens and  should not be used as a sole basis for treatment. Nasal washings and  aspirates are unacceptable for Xpert Xpress SARS-CoV-2/FLU/RSV  testing. Fact Sheet for Patients: https://www.moore.com/ Fact Sheet for Healthcare Providers: https://www.young.biz/ This test is not yet approved or cleared by the [place] FDA and  has been authorized for detection and/or diagnosis of SARS-CoV-2 by  FDA under an Emergency Use Authorization (EUA). This EUA will remain  in effect (meaning this test can be used) for the duration of the  Covid-19 declaration under Section 564(b)(1) of the Act, 21  U.S.C. section 360bbb-3(b)(1), unless the authorization is  terminated or revoked. Performed at [facility], [address] [place], [place] [address]   Resp Panel by RT PCR (RSV, Flu A&B, Covid) -     Status: Abnormal   Collection Time: 12/22/19 11:36 AM  Result Value Ref Range Status   SARS Coronavirus 2 by RT PCR NEGATIVE NEGATIVE Final    Comment: (NOTE) SARS-CoV-2 target nucleic acids are NOT DETECTED. The SARS-CoV-2 RNA is generally detectable in upper respiratoy specimens during the acute phase of infection. The lowest concentration of SARS-CoV-2 viral copies this assay can detect is 131 copies/mL. A negative result does not preclude SARS-Cov-2 infection and should not be used as the sole basis for treatment or other patient management decisions. A negative result may occur with  improper specimen collection/handling, submission of specimen other than nasopharyngeal swab, presence of viral mutation(s) within the areas targeted by this assay, and inadequate number of viral  copies (<131 copies/mL). A negative result must be combined with clinical observations, patient history, and epidemiological information. The expected result  is Negative. Fact Sheet for Patients:  https://www.moore.com/ Fact Sheet for Healthcare Providers:  https://www.young.biz/ This test is not yet ap proved or cleared by the [place] FDA and  has been authorized for detection and/or diagnosis of SARS-CoV-2 by FDA under an Emergency Use Authorization (EUA). This EUA will remain  in effect (meaning this test can be used) for the duration of the COVID-19 declaration under Section 564(b)(1) of the Act, 21 U.S.C. section 360bbb-3(b)(1), unless the authorization is terminated or revoked sooner.    Influenza A by PCR NEGATIVE NEGATIVE Final   Influenza B by PCR NEGATIVE NEGATIVE Final    Comment: (NOTE) The Xpert Xpress SARS-CoV-2/FLU/RSV assay is intended as an aid in  the diagnosis of influenza from Nasopharyngeal swab specimens and  should not be used as a sole basis for treatment. Nasal washings and  aspirates are unacceptable for Xpert Xpress SARS-CoV-2/FLU/RSV  testing. Fact Sheet for Patients: https://www.moore.com/ Fact Sheet for Healthcare Providers: https://www.young.biz/ This test is not yet approved or cleared by the [place] FDA and  has been authorized for detection and/or diagnosis of SARS-CoV-2 by  FDA under an Emergency Use Authorization (EUA). This EUA will remain  in effect (meaning this test can be used) for the duration of the  Covid-19 declaration under Section 564(b)(1) of the Act, 21  U.S.C. section 360bbb-3(b)(1), unless the authorization is  terminated or revoked.    Respiratory Syncytial Virus by PCR POSITIVE (A) NEGATIVE Final    Comment: (NOTE) Fact Sheet for Patients: https://www.moore.com/ Fact Sheet for Healthcare  Providers: https://www.young.biz/ This test is not yet approved or cleared by the [place] FDA and  has been authorized for detection and/or diagnosis of SARS-CoV-2 by  FDA under an Emergency Use Authorization (EUA). This EUA will remain  in effect (meaning this test can be used) for the duration of the  COVID-19 declaration under Section 564(b)(1) of the Act, 21 U.S.C.  section 360bbb-3(b)(1), unless the authorization is terminated or  revoked. Performed at [facility], [address], [place], [place] [address]   Urine Culture     Status: None   Collection Time: 12/24/19 10:00 PM   Specimen: Urine, Random  Result Value Ref Range Status   Specimen Description   Final    URINE, RANDOM Performed at [facility], [address], [place], [place] [address]    Special Requests   Final    NONE Performed at [facility], [address], [place], [place] [address]    Culture   Final    NO GROWTH Performed at [facility], [address] [place], [place] [address]    Report Status 12/26/2019 FINAL  Final         Radiology Studies: No results found.      Scheduled Meds: . amiodarone  200 mg Oral Daily  . carvedilol  6.25 mg Oral BID WC  . diltiazem  120 mg Oral Daily  . docusate sodium  200 mg Oral BID  . guaiFENesin  1,200 mg Oral BID  . ipratropium-albuterol  3 mL Nebulization Q6H  . methylPREDNISolone (SOLU-MEDROL) injection  40 mg Intravenous Q8H  . nicotine  14 mg Transdermal Daily  . Rivaroxaban  15 mg Oral BID WC   Followed by  . [START ON 01/15/2020] rivaroxaban  20 mg Oral Q supper  . sodium chloride flush  3 mL Intravenous Q12H  . spironolactone  12.5 mg Oral Daily   Continuous Infusions: . sodium chloride  LOS: 6 days    Time spent: 35 mins     Charise Killian, MD [facility] Pager [phone]  If 7PM-7AM, please contact night-coverage [email or web address] 12/28/2019, 12:39  PM

## 2019-12-28 NOTE — Progress Notes (Signed)
SATURATION QUALIFICATIONS:   Patient Saturations on Room Air at Rest = 87%  Patient Saturations on [employer] while Ambulating = 75%  Patient Saturations on 2 Liters of oxygen while Ambulating = 90%  Please briefly explain why patient needs home oxygen: SOB due to RSV

## 2019-12-28 NOTE — Evaluation (Signed)
Occupational Therapy Evaluation Patient Details Name: Matthew [name] MRN: [id number] DOB: 01-14-1971 Today's Date: 12/28/2019    History of Present Illness Pt is a 49 year old male with a history of arthritis, PEs, sleep apnea, COPD, tobacco abuse who presents to the ED for respiratory distress. 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 Markedly enlarged main pulmonary artery, consistent with pulmonary hypertension. CT scan findings were discussed with patient on 04/18/21and heparin drip was recommended but he declined and signed out [place] stating that he would continue to take his Xarelto at home. Patient admits to noncompliance with medication. Echo pending. Hx of IVC filter. Completed IV heparin. No acute indication for surgical procedure as per vascular surg.   Clinical Impression   Matthew [name] was seen for OT evaluation this date. Prior to hospital admission, pt was independent c functional mobility and ADLs. Pt presents to acute OT demonstrating impaired ADL performance and functional mobility 2/2 decreased activity tolerance and decreased safety awareness. Per RN, pt has been refusing O2. Upon entry, pt found seated EOB on RA. Pt requires SUP to don B socks seated EOB. SBA + RW face washing standing sink side c MOD VCs for O2 use and PLB. Of note, pt's SpO2 was 75% on RA standing sink side, pt agreeable to don [place] despite pt not feeling out of breath. SpO2 quickly increased to 87% on 2L [place] standing sink side, further improved to 90% c PLB in standing. Remained high 80s-90% on 2L  during ~49mins standing grooming task and conversation. Pt agreeable to wear O2 semi-reclined in bed. Pt would benefit from skilled OT to address noted impairments and functional limitations (see below for any additional details) in order  to maximize safety and independence while minimizing falls risk and caregiver burden. Upon hospital discharge, recommend STR to maximize pt safety given history of non compliance. If pt compliant c O2, pt may be safe for HHOT c Supervision for OOB.       Follow Up Recommendations  SNF;Supervision - Intermittent(if compliant c O2 & medication, safe for HHOT c SUPERVISION)    Equipment Recommendations  3 in 1 bedside commode    Recommendations for Other Services       Precautions / Restrictions Restrictions Weight Bearing Restrictions: No      Mobility Bed Mobility Overal bed mobility: Needs Assistance Bed Mobility: Sit to Supine       Sit to supine: Supervision(c head flat)      Transfers Overall transfer level: Needs assistance Equipment used: Rolling walker (2 wheeled) Transfers: Sit to/from Stand Sit to Stand: Supervision         General transfer comment: VCs for safe t/f technique - pt rocked on bed and pulled up on RW c BUE     Balance Overall balance assessment: Needs assistance Sitting-balance support: No upper extremity supported;Feet supported Sitting balance-Leahy Scale: Good     Standing balance support: Single extremity supported;During functional activity Standing balance-Leahy Scale: Fair                             ADL either performed or assessed with clinical judgement   ADL Overall ADL's : Needs assistance/impaired  General ADL Comments: SUP don B socks seated EOB. SBA + RW face washing standing sink side c MOD VCs for O2 use and PLB.      Vision         Perception     Praxis      Pertinent Vitals/Pain Pain Assessment: No/denies pain     Hand Dominance Right   Extremity/Trunk Assessment Upper Extremity Assessment Upper Extremity Assessment: Overall WFL for tasks assessed   Lower Extremity Assessment Lower Extremity Assessment: Overall WFL for tasks assessed    Cervical / Trunk Assessment Cervical / Trunk Assessment: Normal   Communication Communication Communication: No difficulties   Cognition Arousal/Alertness: Awake/alert Behavior During Therapy: WFL for tasks assessed/performed Overall Cognitive Status: Within Functional Limits for tasks assessed                                     General Comments  Per RN, pt has been refusing O2. Pt found seated EOB on RA. Standing sinkside: SpO2 75% on RA. Pt agreeable to don [place] despite not feeling out of breath. Standing sinkside: SpO2 87% on 2L [place], improved to 90% c PLB in standing. Remained high 80s-90% on 2L [place] during ~58mins standing grooming tasks. Pt agreeable to wear O2 semi-reclined in bed.    Exercises Exercises: Other exercises Other Exercises Other Exercises: Pt educated re: energy conservation, importance of O2 for functinoal endurance, falls prevention, DME recommendations, d/c recommendations, safe RW technique Other Exercises: Face washing, don B socks, sitting/standing balance/tolerance, PLB,  Other Exercises: x   Shoulder Instructions      Home Living Family/patient expects to be discharged to:: Private residence Living Arrangements: Children;Spouse/significant other Available Help at Discharge: Family Type of Home: House Home Access: Stairs to enter Secretary/administrator of Steps: 3 Entrance Stairs-Rails: Can reach both Home Layout: One level     Bathroom Shower/Tub: Chief Strategy Officer: Standard         Additional Comments: Reports he was ind with ADLs without AD prior      Prior Functioning/Environment Level of Independence: Independent        Comments: Pt reports independent with I/ADLs including driving        OT Problem List: Decreased strength;Decreased activity tolerance;Decreased safety awareness;Decreased knowledge of use of DME or AE;Cardiopulmonary status limiting activity      OT Treatment/Interventions:  Self-care/ADL training;Therapeutic exercise;Neuromuscular education;Energy conservation;DME and/or AE instruction;Therapeutic activities;Patient/family education;Balance training    OT Goals(Current goals can be found in the care plan section) Acute Rehab OT Goals Patient Stated Goal: go home OT Goal Formulation: With patient Time For Goal Achievement: 01/11/20 Potential to Achieve Goals: Fair ADL Goals Pt Will Perform Grooming: standing;with modified independence(c LRAD PRN) Pt Will Perform Lower Body Dressing: with modified independence;sit to/from stand(c LRAD PRN) Pt Will Transfer to Toilet: with modified independence;ambulating;regular height toilet(c LRAD PRN) Additional ADL Goal #1: Pt will independently verbalize plan to implement x3 energy conservation strategies  OT Frequency: Min 2X/week   Barriers to D/C: Inaccessible home environment;Decreased caregiver support          Co-evaluation              AM-PAC OT "6 Clicks" Daily Activity     Outcome Measure Help from another person eating meals?: None Help from another person taking care of personal grooming?: None Help from another person toileting, which includes using toliet, bedpan, or urinal?:  A Little Help from another person bathing (including washing, rinsing, drying)?: A Little Help from another person to put on and taking off regular upper body clothing?: None Help from another person to put on and taking off regular lower body clothing?: A Little 6 Click Score: 21   End of Session Equipment Utilized During Treatment: Rolling walker;Oxygen(2L [place]) Nurse Communication: Mobility status;Other (comment)(O2 status)  Activity Tolerance: Patient tolerated treatment well Patient left: in bed;with call bell/phone within reach  OT Visit Diagnosis: Unsteadiness on feet (R26.81);Other abnormalities of gait and mobility (R26.89)                Time: 1030-1050 OT Time Calculation (min): 20 min Charges:  OT General  Charges $OT Visit: 1 Visit OT Evaluation $OT Eval Moderate Complexity: 1 Mod OT Treatments $Self Care/Home Management : 8-22 mins  Kathie Dike, M.S. OTR/L  12/28/19, 11:32 AM

## 2019-12-28 NOTE — Progress Notes (Signed)
Patient refused to wear oxygen.   Madie Reno, RN

## 2019-12-28 NOTE — TOC Initial Note (Signed)
Transition of Care [facility]) - Initial/Assessment Note    Patient Details  Name: Matthew [name] MRN: [id number] Date of Birth: [birth year]  Transition of Care [facility]) CM/SW Contact:    Eileen Stanford, LCSW Phone Number: 12/28/2019, 3:53 PM  Clinical Narrative:    Pt is only alert to self and place. CSW spoke with pt's spouse via telephone. OT recommended SNF or [place] supervision if complient with 02 and medications.  Pt is uninsured. It is highly unlikely that pt will be granted LOG for SNF. Pt's spouse notified and pt's spouse agreeable to [facility] services. Corene Cornea with [facility] has spoke to pt and pt's wife at bedside, [place] [facility] services. Brad with [facility] was notified that pt will need charity 02 and also needs a 3n1. CSW will continue to follow for additional needs.        Expected Discharge Plan: [place] Barriers to Discharge: Continued Medical Work up, Inadequate or no insurance   Patient Goals and CMS Choice Patient states their goals for this hospitalization and ongoing recovery are:: to go home   Choice offered to / list presented to : Patient  Expected Discharge Plan and Services Expected Discharge Plan: [place] In-house Referral: Clinical Social Work   Post Acute Care Choice: [place] arrangements for the past 2 months: Single Family Home                 DME Arranged: Oxygen DME Agency: [facility] Date DME Agency Contacted: 12/28/19 Time DME Agency Contacted: [phone] Representative spoke with at DME Agency: brad HH Arranged: PT, OT, RN [place] Agency: [place] ([place]) Date HH Agency Contacted: 12/28/19 Time [place]: 72 Representative spoke with at [place]: [place] Arrangements/Services Living arrangements for the past 2 months: [place] with:: Spouse Patient language and need for interpreter reviewed:: Yes        Need for Family Participation in Patient  Care: Yes (Comment) Care giver support system in place?: Yes (comment)   Criminal Activity/Legal Involvement Pertinent to Current Situation/Hospitalization: No - Comment as needed  Activities of Daily Living Home Assistive Devices/Equipment: Oxygen, CPAP ADL Screening (condition at time of admission) Patient's cognitive ability adequate to safely complete daily activities?: Yes Is the patient deaf or have difficulty hearing?: No Does the patient have difficulty seeing, even when wearing glasses/contacts?: No Does the patient have difficulty concentrating, remembering, or making decisions?: No Patient able to express need for assistance with ADLs?: Yes Does the patient have difficulty dressing or bathing?: No Independently performs ADLs?: Yes (appropriate for developmental age) Does the patient have difficulty walking or climbing stairs?: No Weakness of Legs: None Weakness of Arms/Hands: None  Permission Sought/Granted Permission sought to share information with : Family Supports    Share Information with NAME: [name]  Permission granted to share info w AGENCY: [facility]  Permission granted to share info w Relationship: spouse     Emotional Assessment Appearance:: Appears stated age Attitude/Demeanor/Rapport: Unable to Assess Affect (typically observed): Unable to Assess Orientation: : Oriented to Self, Oriented to Place Alcohol / Substance Use: Not Applicable Psych Involvement: No (comment)  Admission diagnosis:  Pulmonary embolism (HCC) [I26.99] COPD exacerbation (HCC) [J44.1] Atrial fibrillation with rapid ventricular response (HCC) [I48.91] Acute pulmonary embolism ([place]) [I26.99] Patient Active Problem List   Diagnosis Date Noted  . Shortness of breath 12/22/2019  . Pulmonary embolism ([place]) 12/22/2019  . Atrial fibrillation with RVR ([place]) 12/22/2019  .  Acute pulmonary embolism (HCC) 12/22/2019  . Non compliance w medication regimen 12/22/2019  . COPD (chronic  obstructive pulmonary disease) (HCC) 12/19/2019  . Sepsis (HCC) 12/19/2019  . Community acquired pneumonia 12/19/2019  . Acute on chronic respiratory failure with hypoxia (HCC) 12/19/2019  . OSA (obstructive sleep apnea) 12/19/2019  . Venous ulcer (HCC) 12/09/2019  . COPD with acute exacerbation (HCC)   . Class 3 severe obesity without serious comorbidity with body mass index (BMI) of 40.0 to 44.9 in adult [facility])   . Other cirrhosis of liver (HCC)   . Hypomagnesemia   . Tobacco abuse 10/29/2019  . Prediabetes 04/25/2019  . Wheeze 04/25/2019  . Ingrown toenail of right foot 04/23/2019  . AF (paroxysmal atrial fibrillation) (HCC) 03/19/2019  . DVT (deep venous thrombosis) (HCC) 03/19/2019  . Bilateral pneumonia 03/11/2019  . Acute DVT (deep venous thrombosis) (HCC) 02/17/2019  . DVT of deep femoral vein, right (HCC) 11/19/2018   PCP:  Rolm Gala, NP Pharmacy:   [facility] - [place], [place] - [address] [place] [address] Phone: [phone] Fax: [phone]     Social Determinants of Health (SDOH) Interventions    Readmission Risk Interventions Readmission Risk Prevention Plan 11/03/2019 11/20/2018  Transportation Screening Complete Complete  PCP or Specialist Appt within 3-5 Days - (No Data)  HRI or Home Care Consult - (No Data)  Social Work Consult for Recovery Care Planning/Counseling - (No Data)  Palliative Care Screening Not Applicable Not Applicable  Medication Review Oceanographer) Complete Complete

## 2019-12-29 LAB — BASIC METABOLIC PANEL
Anion gap: 9 (ref 5–15)
BUN: 21 mg/dL — ABNORMAL HIGH (ref 6–20)
CO2: 38 mmol/L — ABNORMAL HIGH (ref 22–32)
Calcium: 8.7 mg/dL — ABNORMAL LOW (ref 8.9–10.3)
Chloride: 91 mmol/L — ABNORMAL LOW (ref 98–111)
Creatinine, Ser: 0.75 mg/dL (ref 0.61–1.24)
GFR calc Af Amer: >60 mL/min (ref >60)
GFR calc non Af Amer: >60 mL/min (ref >60)
Glucose, Bld: 132 mg/dL — ABNORMAL HIGH (ref 70–99)
Potassium: 4.2 mmol/L (ref 3.5–5.1)
Sodium: 138 mmol/L (ref 135–145)

## 2019-12-29 LAB — CBC
HCT: 52.4 % — ABNORMAL HIGH (ref 39.0–52.0)
Hemoglobin: 17.8 g/dL — ABNORMAL HIGH (ref 13.0–17.0)
MCH: 33.3 pg (ref 26.0–34.0)
MCHC: 34 g/dL (ref 30.0–36.0)
MCV: 97.9 fL (ref 80.0–100.0)
Platelets: 145 10*3/uL — ABNORMAL LOW (ref 150–400)
RBC: 5.35 MIL/uL (ref 4.22–5.81)
RDW: 13.7 % (ref 11.5–15.5)
WBC: 9.4 10*3/uL (ref 4.0–10.5)
nRBC: 0 % (ref 0.0–0.2)

## 2019-12-29 MED ORDER — PREDNISONE 10 MG PO TABS
ORAL_TABLET | ORAL | 0 refills | Status: DC
Start: 2019-12-29 — End: 2020-01-07

## 2019-12-29 MED ORDER — IPRATROPIUM-ALBUTEROL 0.5-2.5 (3) MG/3ML IN SOLN
3.0000 mL | Freq: Three times a day (TID) | RESPIRATORY_TRACT | Status: DC
  Administered 2019-12-29: 3 mL via RESPIRATORY_TRACT
  Filled 2019-12-29: qty 3

## 2019-12-29 MED ORDER — SPIRONOLACTONE 25 MG PO TABS: 12.5000 mg | ORAL_TABLET | Freq: Every day | ORAL | 0 refills | Status: DC

## 2019-12-29 MED ORDER — DILTIAZEM HCL ER COATED BEADS 120 MG PO CP24: 120.0000 mg | ORAL_CAPSULE | Freq: Every day | ORAL | 0 refills | Status: DC

## 2019-12-29 MED ORDER — AMIODARONE HCL 200 MG PO TABS: 200.0000 mg | ORAL_TABLET | Freq: Every day | ORAL | 0 refills | Status: DC

## 2019-12-29 MED ORDER — CARVEDILOL 6.25 MG PO TABS: 6.2500 mg | ORAL_TABLET | Freq: Two times a day (BID) | ORAL | 0 refills | Status: DC

## 2019-12-29 NOTE — Progress Notes (Signed)
Patient alert and oriented, vss, no complaints of pain.  D/c home with wife.  D/c telemetry and PIVS.  No questions.  Escorted out of hospital via wheelchair by volunteers

## 2019-12-29 NOTE — TOC Transition Note (Addendum)
Transition of Care [facility]) - CM/SW Discharge Note   Patient Details  Name: Koree Schopf MRN: [id number] Date of Birth: [birth year]  [facility]) CM/SW Contact:  Maree Krabbe, LCSW Phone Number: 12/29/2019, 2:53 PM   Clinical Narrative:   Pt's HH has been arranged through [facility]. Pt's wife made aware that we do not provide tubing for 02 here and pt's wife is agreeable to go to Medical Supply store to purchase the tubing. Address provided. No additional needs at this time.    Final next level of care: Home w Home Health Services Barriers to Discharge: No Barriers Identified   Patient Goals and CMS Choice Patient states their goals for this hospitalization and ongoing recovery are:: to go home   Choice offered to / list presented to : Patient  Discharge Placement                  Name of family member notified: Spouse Patient and family notified of of transfer: 12/29/19  Discharge Plan and Services In-house Referral: Clinical Social Work   Post Acute Care Choice: Home Health          DME Arranged: Oxygen DME Agency: [facility] Date DME Agency Contacted: 12/28/19 Time DME Agency Contacted: [phone] Representative spoke with at DME Agency: brad HH Arranged: PT, OT HH Agency: [facility] Date HH Agency Contacted: 12/29/19 Time HH Agency Contacted: [phone] Representative spoke with at [facility] agency: [place]  Social Determinants of Health (SDOH) Interventions     Readmission Risk Interventions Readmission Risk Prevention Plan 12/28/2019 11/03/2019 11/20/2018  Transportation Screening Complete Complete Complete  PCP or Specialist Appt within 3-5 Days - - (No Data)  HRI or Home Care Consult - - (No Data)  Social Work Librarian, academic for Recovery Care Planning/Counseling - - (No Data)  Palliative Care Screening - Not Applicable Not Applicable  Medication Review (RN Care Manager) Complete Complete Complete  PCP or Specialist appointment within 3-5 days of discharge  Complete - -  HRI or Home Care Consult Complete - -  SW Recovery Care/Counseling Consult Complete - -  Palliative Care Screening Not Applicable - -  Skilled Nursing Facility Complete - -

## 2019-12-29 NOTE — Progress Notes (Addendum)
SATURATION QUALIFICATIONS: (This note is used to comply with regulatory documentation for home oxygen)  Patient Saturations on Room Air at Rest = 90%  Patient Saturations on Room Air while Ambulating = 88%  Patient Saturations on 2 Liters of oxygen while Ambulating = refused   Please briefly explain why patient needs home oxygen:  copd

## 2019-12-29 NOTE — Discharge Summary (Signed)
Physician Discharge Summary  Matthew [name] CVE:[id number] DOB: 09/14/1970 DOA: 12/22/2019  PCP: Rolm Gala, NP  Admit date: 12/22/2019 Discharge date: 12/29/2019  Admitted From: home  Disposition:  Home w/ home health   Recommendations for Outpatient Follow-up:  1. Follow up with PCP in 1 week 2. F/u pulmon in 1 week  3. F.u cardio, Dr. Welton Flakes, on 12/31/19  Home Health: yes Equipment/Devices: supplemental oxygen but pt is refusing to wear oxygen despite him needing it   Discharge Condition: guarded as pt is refusing to wear supplemental oxygen  CODE STATUS: full Diet recommendation: Heart Healthy   Brief/Interim Summary: HPI was taken from Dr. Joylene Igo: Matthew [name] is a 49 y.o. 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  [facility] and was reportedly hypoxic and tachypneic with pulse oximetry of 67% on room air.  He was placed on 4 L of oxygen and he received bronchodilator therapy as well as IV Solu-Medrol. Patient recently signed out AGAINST MEDICAL ADVICE 2 days prior to his readmission.  During that hospitalization he had a CT angiogram of the chest that revealed occlusion of the right middle lobe pulmonary artery at its origin with a small amount of thrombus extending into the right lower lobe artery.  Markedly enlarged main pulmonary artery consistent with pulmonary hypertension.  The CT scan findings were discussed with patient and heparin drip was recommended since it was assumed that he had failed therapy with Xarelto.  Patient refused to have heparin administered and signed out AGAINST MEDICAL ADVICE Chest x-ray showed cardiomegaly  and mild interstitial pulmonary edema. Twelve-lead EKG showed atrial fibrillation with rapid ventricular rate  ED Course: The patient had presented fordifficulty breathing and hypoxia. Patient's labsrevealed a mildly elevated troponin which is likely demand related. Patient's imagingreveals cardiomegaly and mild interstitial pulmonary edema. He was given a dose of Cardizem both IV and oral for rate control of his A. fib. Patient states has been noncompliant. He had received duo nebs and steroids for COPD and is still wheezing requiring supplemental oxygen.   Hospital Course from Dr. Wilfred Lacy 4/21-4/27/21: Pt was found to have acute pulmonary embolism on 12/20/19 but left AMA. Pt came back again for the acute PE and pt was initially put IV heparin. Pt was then transitioned back to xarelto. Pt admits to being non-complaint w/ taking his xarelto. Furthermore, pt was found to have acute COPD exacerbation that was treated w/ IV steroids, azithromycin, bronchodilators, & supplemental oxygen. Pt was unable to be weaned from supplemental oxygen w/ exertion but pt refused to be d/c with oxygen. Of note, pt was found to have acute systolic CHF exacerbation and pt was started on coreg, spironolactone as per cardio. Pt was also start on amiodarone & cardizem for uncontrolled a. fib as per cardio. PT/OT saw the pt and initially recommended SNF but pt does not have insurance so CM was able to get a charity to cover for home health for this pt. Pt is high risk for re-admission due to non-compliance.    Discharge Diagnoses:  Principal Problem:   Pulmonary embolism (HCC) Active Problems:   COPD with acute exacerbation (HCC)   Class 3 severe obesity without serious comorbidity with body mass  index (BMI) of 40.0 to 44.9 in adult [facility])   Acute on chronic respiratory failure with hypoxia (HCC)   Atrial fibrillation with RVR (HCC)   Acute pulmonary embolism (HCC)   Non compliance w medication regimen  Acute  pulmonary embolism: as per CT angiogram of the chest which was done on 12/20/18. Markedly enlarged main pulmonary artery, consistent with pulmonary hypertension. Pt signed out AGAINST MEDICAL ADVICE stating that he would continue to take his Xarelto at home. Patient admits to noncompliance with medication. Echo shows LV 45-50% w/ LV global hypokinesis, RV systolic function is decreased, no shunts detected. Hx of IVC filter. D/c IV heparin drip and continue on xarelto. No acute indication for surgical procedure as per vascular surg. Vascular surg recs apprec   Acute hypoxic & hypercarbic respiratory failure: multifactorial related to acute COPD exacerbation, RSV, as well as acute PE. Continue on supplemental oxygen and wean as tolerated. Continue on xarelto.Will likely need to be d/c w/ oxygen. DME oxygen orders placed but pt is refusing to wear oxygen   COPD with acute exacerbation: continue on bronchodilators & IV steroids. Completed abx course. Encourage incentive spirometry & flutter valve  Encephalopathy: intermittent. Etiology unclear, possibly secondary delirium vs hypercarbia. Resolved  RSV: continue w/ IV steroids, bronchodilators & supportive care. Continue on supplemental oxygen and wean as tolerated   Atrial fibrillation: w/ RVR. PAF vs chronic. D/c IV cardizem drip and continue po cardizem & carvedilol as per cardio. Continue on xarelto. Cardio following and recs apprec  Acute systolic CHF: continue on coreg, spironolactone as per cardio  Hyperkalemia: resolved. Will continue to monitor   Morbid obesity: BMI 44.3. Complicates overall prognosis and care  Generalized weakness: d/c w/ home health via charity   Thrombocytopenia: etiology unclear. Will continue to monitor   Discharge Instructions  Discharge Instructions    Diet - low sodium heart healthy   Complete by: As directed    Discharge instructions   Complete by: As directed    F/u PCP in 1 week; F/u pulmon in 1 week    Increase activity slowly   Complete by: As directed      Allergies as of 12/29/2019      Reactions   Amoxicillin Rash      Medication List    TAKE these medications   albuterol 108 (90 Base) MCG/ACT inhaler Commonly known as: VENTOLIN HFA Inhale 2 puffs into the lungs every 6 (six) hours as needed for wheezing or shortness of breath.   amiodarone 200 MG tablet Commonly known as: PACERONE Take 1 tablet (200 mg total) by mouth daily. Start taking on: December 30, 2019   carvedilol 6.25 MG tablet Commonly known as: COREG Take 1 tablet (6.25 mg total) by mouth 2 (two) times daily with a meal.   diltiazem 120 MG 24 hr capsule Commonly known as: CARDIZEM CD Take 1 capsule (120 mg total) by mouth daily. Start taking on: December 30, 2019   nicotine 14 mg/24hr patch Commonly known as: NICODERM CQ - dosed in mg/24 hours One 14mg  patch chest wall daily (okay to substitute generic)   predniSONE 10 MG tablet Commonly known as: DELTASONE 40mg  daily x 3 days, 30mg  daily x 3 days, 20mg  daily x 3 days, 10mg  daily x 3 days then stop   Rivaroxaban Stater Pack (15 mg and 20 mg) Commonly known as: XARELTO STARTER PACK Follow package directions: Take one 15mg  tablet by mouth twice a day. On day 22, switch to one 20mg  tablet once  a day. Take with food. What changed:   how much to take  how to take this  when to take this   spironolactone 25 MG tablet Commonly known as: ALDACTONE Take 0.5 tablets (12.5 mg total) by mouth daily. Start taking on: December 30, 2019            Durable Medical Equipment  (From admission, onward)         Start     Ordered   12/28/19 1241  For home use only DME 3 n 1  Once     12/28/19 1240   12/28/19 1236  For home use only DME oxygen  Once    Question Answer Comment  Length of Need 12 Months   Liters per Minute 2   Frequency Continuous (stationary and portable oxygen unit needed)   Oxygen delivery system Gas      12/28/19 1236          Follow-up Information    Laurier Nancy, MD. Go on 12/31/2019.   Specialty: Cardiology Why: 1030AM Contact information: [address] [place] [address] [phone]        [facility] Follow up.   Specialty: Home Health Services Why: Physical therapy, Occupational Therapy Contact information: [address] [place] [address] [phone]          Allergies  Allergen Reactions  . Amoxicillin Rash    Consultations:  Cardio  Vascular surgery    Procedures/Studies: DG Chest 2 View  Result Date: 12/19/2019 CLINICAL DATA:  Suspected sepsis. Fever. Malaise. Wife diagnosed with COVID. EXAM: CHEST - 2 VIEW COMPARISON:  03/12/2019 FINDINGS: Cardiomegaly, unchanged. Mild interstitial opacities that appears similar to prior exam and are likely chronic. No confluent airspace disease. No pleural fluid or pneumothorax. No acute osseous abnormalities are seen. IMPRESSION: No acute findings or change from prior exam.  Chronic cardiomegaly. Electronically Signed   By: Narda Rutherford M.D.   On: 12/19/2019 19:55   CT Angio Chest PE W and/or Wo Contrast  Result Date: 12/20/2019 CLINICAL DATA:  Fever and hypoxia. Recent COVID-19 exposure. EXAM: CT ANGIOGRAPHY CHEST WITH CONTRAST TECHNIQUE: Multidetector CT imaging of the chest was performed using the standard protocol during bolus administration of intravenous contrast. Multiplanar CT image reconstructions and MIPs were obtained to evaluate the vascular anatomy. CONTRAST:  OMNIPAQUE IOHEXOL 350 MG/ML SOLN COMPARISON:  None. FINDINGS: Cardiovascular: Contrast injection is sufficient to demonstrate satisfactory opacification of the pulmonary arteries to the segmental level. The right middle lobe pulmonary artery is occluded at its origin. There is a small amount of thrombus extending into the right lower lobar artery. No left-sided filling defect. The size of the main pulmonary artery is markedly enlarged,  measuring 4.6 cm. Moderate cardiomegaly. The right ventricle is mildly enlarged relative to the left with a ratio 1.3. The course and caliber of the aorta are normal. There is no atherosclerotic calcification. Opacification decreased due to pulmonary arterial phase contrast bolus timing. Mediastinum/Nodes: No mediastinal, hilar or axillary lymphadenopathy. Normal visualized thyroid. Thoracic esophageal course is normal. Lungs/Pleura: Mild multifocal opacity in the right lower lobe. No pleural effusion or pneumothorax. Airways are patent. No pulmonary infarct. Upper Abdomen: Contrast bolus timing is not optimized for evaluation of the abdominal organs. Cholelithiasis. Musculoskeletal: No chest wall abnormality. No bony spinal canal stenosis. Review of the MIP images confirms the above findings. IMPRESSION: 1. Occlusion of the right middle lobe pulmonary artery at its origin, with a small amount of thrombus extending  into the right lower lobar artery. 2. Markedly enlarged main pulmonary artery, consistent with pulmonary hypertension. 3. RV/LV ratio of 1.3 may indicate a degree of right heart strain. This may be due to underlying chronic pulmonary hypertension. 4. Mild multifocal opacity in the right lower lobe may indicate aspiration or pneumonia. Critical Value/emergent results were called by telephone at the time of interpretation on 12/20/2019 at 1:18 am to provider Dr. Manson Passey, who verbally acknowledged these results. Electronically Signed   By: Deatra Robinson M.D.   On: 12/20/2019 01:19   DG Chest Port 1 View  Result Date: 12/22/2019 CLINICAL DATA:  Shortness of breath and decreased oxygen saturation today. EXAM: PORTABLE CHEST 1 VIEW COMPARISON:  PA and lateral chest 12/19/2019 and 03/12/2019. CT chest 12/20/2019. FINDINGS: There is cardiomegaly and mild interstitial edema. No consolidative process, pneumothorax or effusion. No acute or focal bony abnormality. IMPRESSION: Cardiomegaly and mild interstitial  pulmonary edema. Electronically Signed   By: Drusilla Kanner M.D.   On: 12/22/2019 12:16   ECHOCARDIOGRAM COMPLETE  Result Date: 12/24/2019    ECHOCARDIOGRAM REPORT   Patient Name:   JAMARIAN JACINTO Date of Exam: 12/24/2019 Medical Rec #:  [id number]       Height:       67.0 in Accession #:    [id number]      Weight:       283.0 lb Date of Birth:  28-Jan-1971       BSA:          2.344 m Patient Age:    48 years        BP:           92/58 mmHg Patient Gender: M               HR:           54 bpm. Exam Location:  [facility] Procedure: 2D Echo, Cardiac Doppler and Color Doppler Indications:     Atrial Fibrillation 427.31  History:         Patient has prior history of Echocardiogram examinations, most                  recent 03/11/2019. Risk Factors:Sleep Apnea. History of blood                  clots.  Sonographer:     Cristela Blue RDCS (AE) Referring Phys:  [id number] Caryn Bee PACKER Diagnosing Phys: Julien Nordmann MD  Sonographer Comments: No apical window and Technically difficult study due to poor echo windows. IMPRESSIONS  1. Left ventricular ejection fraction, by estimation, is 45 to 50%. The left ventricle has mildly decreased function. The left ventricle demonstrates global hypokinesis. There is mild left ventricular hypertrophy. Left ventricular diastolic parameters are indeterminate.  2. Right ventricular is not well visualized, systolic function is grossly moderately reduced. The right ventricular size is moderately enlarged. There is mildly elevated pulmonary artery systolic pressure.  3. Left atrial size was mildly dilated.  4. Challenging image quality FINDINGS  Left Ventricle: Left ventricular ejection fraction, by estimation, is 45 to 50%. The left ventricle has mildly decreased function. The left ventricle demonstrates global hypokinesis. The left ventricular internal cavity size was normal in size. There is  mild left ventricular hypertrophy. Left ventricular diastolic parameters are indeterminate. Right  Ventricle: The right ventricular size is moderately enlarged. No increase in right ventricular wall thickness. Right ventricular systolic function is moderately reduced. There is mildly elevated pulmonary artery systolic pressure. The tricuspid  regurgitant velocity is 2.44 m/s, and with an assumed right atrial pressure of 10 mmHg, the estimated right ventricular systolic pressure is 33.8 mmHg. Left Atrium: Left atrial size was mildly dilated. Right Atrium: Right atrial size was normal in size. Pericardium: There is no evidence of pericardial effusion. Mitral Valve: The mitral valve was not well visualized. Normal mobility of the mitral valve leaflets. No evidence of mitral valve regurgitation. No evidence of mitral valve stenosis. Tricuspid Valve: The tricuspid valve is not well visualized. Tricuspid valve regurgitation is mild . No evidence of tricuspid stenosis. Aortic Valve: The aortic valve is normal in structure. Aortic valve regurgitation is not visualized. No aortic stenosis is present. There is no evidence of aortic valve vegetation. Pulmonic Valve: The pulmonic valve was not well visualized. Pulmonic valve regurgitation is not visualized. No evidence of pulmonic stenosis. Aorta: The aortic root is normal in size and structure. Venous: The inferior vena cava is normal in size with greater than 50% respiratory variability, suggesting right atrial pressure of 3 mmHg. IAS/Shunts: No atrial level shunt detected by color flow Doppler.  LEFT VENTRICLE PLAX 2D LVIDd:         4.77 cm LVIDs:         3.75 cm LV PW:         1.32 cm LV IVS:        1.56 cm LVOT diam:     2.10 cm LVOT Area:     3.46 cm  LEFT ATRIUM         Index LA diam:    4.70 cm 2.01 cm/m                        PULMONIC VALVE AORTA                 PV Vmax:        0.53 m/s Ao Root diam: 2.80 cm PV Peak grad:   1.1 mmHg                       RVOT Peak grad: 3 mmHg  TRICUSPID VALVE TR Peak grad:   23.8 mmHg TR Vmax:        244.00 cm/s  SHUNTS Systemic  Diam: 2.10 cm Julien Nordmannimothy Gollan MD Electronically signed by Julien Nordmannimothy Gollan MD Signature Date/Time: 12/24/2019/2:08:10 PM    Final       Subjective: Pt c/o fatigue   Discharge Exam: Vitals:   12/29/19 1212 12/29/19 1221  BP: 122/88   Pulse: 73   Resp:    Temp: 97.8 F (36.6 C)   SpO2: 90% 90%   Vitals:   12/29/19 0406 12/29/19 0726 12/29/19 1212 12/29/19 1221  BP: (!) 148/95 (!) 130/99 122/88   Pulse: 75 65 73   Resp:      Temp: 98.2 F (36.8 C) 97.7 F (36.5 C) 97.8 F (36.6 C)   TempSrc: Oral Oral Oral   SpO2: 94% 91% 90% 90%  Weight: 122.8 kg     Height:        General: Pt is alert, awake, not in acute distress Cardiovascular: irregularly irregular, no rubs, no gallops Respiratory: diminished breath sounds b/l/. Scattered wheezes Abdominal: Soft, NT, obese, bowel sounds + Extremities: b/l LE edema, no cyanosis    The results of significant diagnostics from this hospitalization (including imaging, microbiology, ancillary and laboratory) are listed below for reference.     Microbiology: Recent Results (from the past 240  hour(s))  Culture, blood (Routine x 2)     Status: None   Collection Time: 12/19/19  7:35 PM   Specimen: BLOOD  Result Value Ref Range Status   Specimen Description BLOOD RIGHT ANTECUBITAL  Final   Special Requests   Final    BOTTLES DRAWN AEROBIC AND ANAEROBIC Blood Culture results may not be optimal due to an excessive volume of blood received in culture bottles   Culture   Final    NO GROWTH 5 DAYS Performed at [facility], [address] [place], [place] [address]    Report Status 12/24/2019 FINAL  Final  Culture, blood (Routine x 2)     Status: None   Collection Time: 12/19/19  7:36 PM   Specimen: BLOOD  Result Value Ref Range Status   Specimen Description BLOOD BLOOD RIGHT HAND  Final   Special Requests   Final    BOTTLES DRAWN AEROBIC AND ANAEROBIC Blood Culture adequate volume   Culture   Final    NO GROWTH 5  DAYS Performed at [facility], [address], [place], [place] [address]    Report Status 12/24/2019 FINAL  Final  Respiratory Panel by RT PCR (Flu A&B, Covid) - Nasopharyngeal Swab     Status: None   Collection Time: 12/19/19 11:26 PM   Specimen: Nasopharyngeal Swab  Result Value Ref Range Status   SARS Coronavirus 2 by RT PCR NEGATIVE NEGATIVE Final    Comment: (NOTE) SARS-CoV-2 target nucleic acids are NOT DETECTED. The SARS-CoV-2 RNA is generally detectable in upper respiratoy specimens during the acute phase of infection. The lowest concentration of SARS-CoV-2 viral copies this assay can detect is 131 copies/mL. A negative result does not preclude SARS-Cov-2 infection and should not be used as the sole basis for treatment or other patient management decisions. A negative result may occur with  improper specimen collection/handling, submission of specimen other than nasopharyngeal swab, presence of viral mutation(s) within the areas targeted by this assay, and inadequate number of viral copies (<131 copies/mL). A negative result must be combined with clinical observations, patient history, and epidemiological information. The expected result is Negative. Fact Sheet for Patients:  https://www.moore.com/ Fact Sheet for Healthcare Providers:  https://www.young.biz/ This test is not yet ap proved or cleared by the [place] FDA and  has been authorized for detection and/or diagnosis of SARS-CoV-2 by FDA under an Emergency Use Authorization (EUA). This EUA will remain  in effect (meaning this test can be used) for the duration of the COVID-19 declaration under Section 564(b)(1) of the Act, 21 U.S.C. section 360bbb-3(b)(1), unless the authorization is terminated or revoked sooner.    Influenza A by PCR NEGATIVE NEGATIVE Final   Influenza B by PCR NEGATIVE NEGATIVE Final    Comment: (NOTE) The Xpert Xpress SARS-CoV-2/FLU/RSV  assay is intended as an aid in  the diagnosis of influenza from Nasopharyngeal swab specimens and  should not be used as a sole basis for treatment. Nasal washings and  aspirates are unacceptable for Xpert Xpress SARS-CoV-2/FLU/RSV  testing. Fact Sheet for Patients: https://www.moore.com/ Fact Sheet for Healthcare Providers: https://www.young.biz/ This test is not yet approved or cleared by the [place] FDA and  has been authorized for detection and/or diagnosis of SARS-CoV-2 by  FDA under an Emergency Use Authorization (EUA). This EUA will remain  in effect (meaning this test can be used) for the duration of the  Covid-19 declaration under Section 564(b)(1) of the Act, 21  U.S.C. section 360bbb-3(b)(1), unless  the authorization is  terminated or revoked. Performed at [facility], [address] [place], [place] [address]   Respiratory Panel by RT PCR (Flu A&B, Covid) - Nasopharyngeal Swab     Status: None   Collection Time: 12/22/19 11:36 AM   Specimen: Nasopharyngeal Swab  Result Value Ref Range Status   SARS Coronavirus 2 by RT PCR NEGATIVE NEGATIVE Final    Comment: (NOTE) SARS-CoV-2 target nucleic acids are NOT DETECTED. The SARS-CoV-2 RNA is generally detectable in upper respiratoy specimens during the acute phase of infection. The lowest concentration of SARS-CoV-2 viral copies this assay can detect is 131 copies/mL. A negative result does not preclude SARS-Cov-2 infection and should not be used as the sole basis for treatment or other patient management decisions. A negative result may occur with  improper specimen collection/handling, submission of specimen other than nasopharyngeal swab, presence of viral mutation(s) within the areas targeted by this assay, and inadequate number of viral copies (<131 copies/mL). A negative result must be combined with clinical observations, patient history, and epidemiological  information. The expected result is Negative. Fact Sheet for Patients:  https://www.moore.com/ Fact Sheet for Healthcare Providers:  https://www.young.biz/ This test is not yet ap proved or cleared by the [place] FDA and  has been authorized for detection and/or diagnosis of SARS-CoV-2 by FDA under an Emergency Use Authorization (EUA). This EUA will remain  in effect (meaning this test can be used) for the duration of the COVID-19 declaration under Section 564(b)(1) of the Act, 21 U.S.C. section 360bbb-3(b)(1), unless the authorization is terminated or revoked sooner.    Influenza A by PCR NEGATIVE NEGATIVE Final   Influenza B by PCR NEGATIVE NEGATIVE Final    Comment: (NOTE) The Xpert Xpress SARS-CoV-2/FLU/RSV assay is intended as an aid in  the diagnosis of influenza from Nasopharyngeal swab specimens and  should not be used as a sole basis for treatment. Nasal washings and  aspirates are unacceptable for Xpert Xpress SARS-CoV-2/FLU/RSV  testing. Fact Sheet for Patients: https://www.moore.com/ Fact Sheet for Healthcare Providers: https://www.young.biz/ This test is not yet approved or cleared by the [place] FDA and  has been authorized for detection and/or diagnosis of SARS-CoV-2 by  FDA under an Emergency Use Authorization (EUA). This EUA will remain  in effect (meaning this test can be used) for the duration of the  Covid-19 declaration under Section 564(b)(1) of the Act, 21  U.S.C. section 360bbb-3(b)(1), unless the authorization is  terminated or revoked. Performed at [facility], [address] [place], [place] [address]   Resp Panel by RT PCR (RSV, Flu A&B, Covid) -     Status: Abnormal   Collection Time: 12/22/19 11:36 AM  Result Value Ref Range Status   SARS Coronavirus 2 by RT PCR NEGATIVE NEGATIVE Final    Comment: (NOTE) SARS-CoV-2 target nucleic acids are NOT  DETECTED. The SARS-CoV-2 RNA is generally detectable in upper respiratoy specimens during the acute phase of infection. The lowest concentration of SARS-CoV-2 viral copies this assay can detect is 131 copies/mL. A negative result does not preclude SARS-Cov-2 infection and should not be used as the sole basis for treatment or other patient management decisions. A negative result may occur with  improper specimen collection/handling, submission of specimen other than nasopharyngeal swab, presence of viral mutation(s) within the areas targeted by this assay, and inadequate number of viral copies (<131 copies/mL). A negative result must be combined with clinical observations, patient history, and epidemiological information. The expected result  is Negative. Fact Sheet for Patients:  PinkCheek.be Fact Sheet for Healthcare Providers:  GravelBags.it This test is not yet ap proved or cleared by the [place] FDA and  has been authorized for detection and/or diagnosis of SARS-CoV-2 by FDA under an Emergency Use Authorization (EUA). This EUA will remain  in effect (meaning this test can be used) for the duration of the COVID-19 declaration under Section 564(b)(1) of the Act, 21 U.S.C. section 360bbb-3(b)(1), unless the authorization is terminated or revoked sooner.    Influenza A by PCR NEGATIVE NEGATIVE Final   Influenza B by PCR NEGATIVE NEGATIVE Final    Comment: (NOTE) The Xpert Xpress SARS-CoV-2/FLU/RSV assay is intended as an aid in  the diagnosis of influenza from Nasopharyngeal swab specimens and  should not be used as a sole basis for treatment. Nasal washings and  aspirates are unacceptable for Xpert Xpress SARS-CoV-2/FLU/RSV  testing. Fact Sheet for Patients: PinkCheek.be Fact Sheet for Healthcare Providers: GravelBags.it This test is not yet approved or cleared  by the [place] FDA and  has been authorized for detection and/or diagnosis of SARS-CoV-2 by  FDA under an Emergency Use Authorization (EUA). This EUA will remain  in effect (meaning this test can be used) for the duration of the  Covid-19 declaration under Section 564(b)(1) of the Act, 21  U.S.C. section 360bbb-3(b)(1), unless the authorization is  terminated or revoked.    Respiratory Syncytial Virus by PCR POSITIVE (A) NEGATIVE Final    Comment: (NOTE) Fact Sheet for Patients: PinkCheek.be Fact Sheet for Healthcare Providers: GravelBags.it This test is not yet approved or cleared by the [place] FDA and  has been authorized for detection and/or diagnosis of SARS-CoV-2 by  FDA under an Emergency Use Authorization (EUA). This EUA will remain  in effect (meaning this test can be used) for the duration of the  COVID-19 declaration under Section 564(b)(1) of the Act, 21 U.S.C.  section 360bbb-3(b)(1), unless the authorization is terminated or  revoked. Performed at [facility], [address], [place], [place] [address]   Urine Culture     Status: None   Collection Time: 12/24/19 10:00 PM   Specimen: Urine, Random  Result Value Ref Range Status   Specimen Description   Final    URINE, RANDOM Performed at [facility], [address], [place], [place] [address]    Special Requests   Final    NONE Performed at [facility], [address], [place], [place] [address]    Culture   Final    NO GROWTH Performed at [facility], [address] [place], [place] [address]    Report Status 12/26/2019 FINAL  Final     Labs: BNP (last 3 results) Recent Labs    03/10/19 1940 12/22/19 1136  BNP 41.0 962.9*   Basic Metabolic Panel: Recent Labs  Lab 12/25/19 0452 12/26/19 0458 12/27/19 0532 12/28/19 0754 12/29/19 0559  NA 137 136 137 135 138  K 5.2* 5.0 4.9 4.8 4.2  CL  98 95* 94* 91* 91*  CO2 34* 35* 37* 36* 38*  GLUCOSE 147* 138* 123* 124* 132*  BUN 28* 30* 26* 25* 21*  CREATININE 0.88 0.79 0.80 0.72 0.75  CALCIUM 8.7* 8.7* 8.7* 8.5* 8.7*   Liver Function Tests: No results for input(s): AST, ALT, ALKPHOS, BILITOT, PROT, ALBUMIN in the last 168 hours. No results for input(s): LIPASE, AMYLASE in the last 168 hours. No results for input(s): AMMONIA in the last  168 hours. CBC: Recent Labs  Lab 12/25/19 0452 12/26/19 0458 12/27/19 0532 12/28/19 0754 12/29/19 0559  WBC 11.6* 8.7 8.6 8.8 9.4  HGB 15.7 15.6 17.4* 17.1* 17.8*  HCT 49.2 48.4 53.6* 50.4 52.4*  MCV 103.6* 103.0* 101.7* 97.9 97.9  PLT 135* 138* 111* 130* 145*   Cardiac Enzymes: No results for input(s): CKTOTAL, CKMB, CKMBINDEX, TROPONINI in the last 168 hours. BNP: Invalid input(s): POCBNP CBG: No results for input(s): GLUCAP in the last 168 hours. D-Dimer No results for input(s): DDIMER in the last 72 hours. Hgb A1c No results for input(s): HGBA1C in the last 72 hours. Lipid Profile No results for input(s): CHOL, HDL, LDLCALC, TRIG, CHOLHDL, LDLDIRECT in the last 72 hours. Thyroid function studies No results for input(s): TSH, T4TOTAL, T3FREE, THYROIDAB in the last 72 hours.  Invalid input(s): FREET3 Anemia work up No results for input(s): VITAMINB12, FOLATE, FERRITIN, TIBC, IRON, RETICCTPCT in the last 72 hours. Urinalysis    Component Value Date/Time   COLORURINE STRAW (A) 06/03/2019 1426   APPEARANCEUR CLEAR (A) 06/03/2019 1426   APPEARANCEUR Clear 02/05/2019 1251   LABSPEC 1.005 06/03/2019 1426   PHURINE 6.0 06/03/2019 1426   GLUCOSEU NEGATIVE 06/03/2019 1426   HGBUR NEGATIVE 06/03/2019 1426   BILIRUBINUR NEGATIVE 06/03/2019 1426   BILIRUBINUR Negative 02/05/2019 1251   KETONESUR NEGATIVE 06/03/2019 1426   PROTEINUR NEGATIVE 06/03/2019 1426   NITRITE NEGATIVE 06/03/2019 1426   LEUKOCYTESUR NEGATIVE 06/03/2019 1426   Sepsis Labs Invalid input(s): PROCALCITONIN,   WBC,  LACTICIDVEN Microbiology Recent Results (from the past 240 hour(s))  Culture, blood (Routine x 2)     Status: None   Collection Time: 12/19/19  7:35 PM   Specimen: BLOOD  Result Value Ref Range Status   Specimen Description BLOOD RIGHT ANTECUBITAL  Final   Special Requests   Final    BOTTLES DRAWN AEROBIC AND ANAEROBIC Blood Culture results may not be optimal due to an excessive volume of blood received in culture bottles   Culture   Final    NO GROWTH 5 DAYS Performed at [facility], [address], [place], [place] [address]    Report Status 12/24/2019 FINAL  Final  Culture, blood (Routine x 2)     Status: None   Collection Time: 12/19/19  7:36 PM   Specimen: BLOOD  Result Value Ref Range Status   Specimen Description BLOOD BLOOD RIGHT HAND  Final   Special Requests   Final    BOTTLES DRAWN AEROBIC AND ANAEROBIC Blood Culture adequate volume   Culture   Final    NO GROWTH 5 DAYS Performed at [facility], [address] [place], [place] [address]    Report Status 12/24/2019 FINAL  Final  Respiratory Panel by RT PCR (Flu A&B, Covid) - Nasopharyngeal Swab     Status: None   Collection Time: 12/19/19 11:26 PM   Specimen: Nasopharyngeal Swab  Result Value Ref Range Status   SARS Coronavirus 2 by RT PCR NEGATIVE NEGATIVE Final    Comment: (NOTE) SARS-CoV-2 target nucleic acids are NOT DETECTED. The SARS-CoV-2 RNA is generally detectable in upper respiratoy specimens during the acute phase of infection. The lowest concentration of SARS-CoV-2 viral copies this assay can detect is 131 copies/mL. A negative result does not preclude SARS-Cov-2 infection and should not be used as the sole basis for treatment or other patient management decisions. A negative result may occur with  improper specimen collection/handling, submission of specimen other than nasopharyngeal swab, presence of  viral mutation(s) within the areas targeted by this assay, and inadequate  number of viral copies (<131 copies/mL). A negative result must be combined with clinical observations, patient history, and epidemiological information. The expected result is Negative. Fact Sheet for Patients:  https://www.moore.com/ Fact Sheet for Healthcare Providers:  https://www.young.biz/ This test is not yet ap proved or cleared by the [place] FDA and  has been authorized for detection and/or diagnosis of SARS-CoV-2 by FDA under an Emergency Use Authorization (EUA). This EUA will remain  in effect (meaning this test can be used) for the duration of the COVID-19 declaration under Section 564(b)(1) of the Act, 21 U.S.C. section 360bbb-3(b)(1), unless the authorization is terminated or revoked sooner.    Influenza A by PCR NEGATIVE NEGATIVE Final   Influenza B by PCR NEGATIVE NEGATIVE Final    Comment: (NOTE) The Xpert Xpress SARS-CoV-2/FLU/RSV assay is intended as an aid in  the diagnosis of influenza from Nasopharyngeal swab specimens and  should not be used as a sole basis for treatment. Nasal washings and  aspirates are unacceptable for Xpert Xpress SARS-CoV-2/FLU/RSV  testing. Fact Sheet for Patients: https://www.moore.com/ Fact Sheet for Healthcare Providers: https://www.young.biz/ This test is not yet approved or cleared by the [place] FDA and  has been authorized for detection and/or diagnosis of SARS-CoV-2 by  FDA under an Emergency Use Authorization (EUA). This EUA will remain  in effect (meaning this test can be used) for the duration of the  Covid-19 declaration under Section 564(b)(1) of the Act, 21  U.S.C. section 360bbb-3(b)(1), unless the authorization is  terminated or revoked. Performed at [facility], [address] [place], [place] [address]   Respiratory Panel by RT PCR (Flu A&B, Covid) - Nasopharyngeal Swab     Status: None   Collection Time: 12/22/19  11:36 AM   Specimen: Nasopharyngeal Swab  Result Value Ref Range Status   SARS Coronavirus 2 by RT PCR NEGATIVE NEGATIVE Final    Comment: (NOTE) SARS-CoV-2 target nucleic acids are NOT DETECTED. The SARS-CoV-2 RNA is generally detectable in upper respiratoy specimens during the acute phase of infection. The lowest concentration of SARS-CoV-2 viral copies this assay can detect is 131 copies/mL. A negative result does not preclude SARS-Cov-2 infection and should not be used as the sole basis for treatment or other patient management decisions. A negative result may occur with  improper specimen collection/handling, submission of specimen other than nasopharyngeal swab, presence of viral mutation(s) within the areas targeted by this assay, and inadequate number of viral copies (<131 copies/mL). A negative result must be combined with clinical observations, patient history, and epidemiological information. The expected result is Negative. Fact Sheet for Patients:  https://www.moore.com/ Fact Sheet for Healthcare Providers:  https://www.young.biz/ This test is not yet ap proved or cleared by the [place] FDA and  has been authorized for detection and/or diagnosis of SARS-CoV-2 by FDA under an Emergency Use Authorization (EUA). This EUA will remain  in effect (meaning this test can be used) for the duration of the COVID-19 declaration under Section 564(b)(1) of the Act, 21 U.S.C. section 360bbb-3(b)(1), unless the authorization is terminated or revoked sooner.    Influenza A by PCR NEGATIVE NEGATIVE Final   Influenza B by PCR NEGATIVE NEGATIVE Final    Comment: (NOTE) The Xpert Xpress SARS-CoV-2/FLU/RSV assay is intended as an aid in  the diagnosis of influenza from Nasopharyngeal swab specimens and  should not be used as a sole basis for treatment. Nasal washings and  aspirates are unacceptable for Xpert Xpress SARS-CoV-2/FLU/RSV   testing. Fact Sheet for Patients: https://www.moore.com/ Fact Sheet for Healthcare Providers: https://www.young.biz/ This test is not yet approved or cleared by the [place] FDA and  has been authorized for detection and/or diagnosis of SARS-CoV-2 by  FDA under an Emergency Use Authorization (EUA). This EUA will remain  in effect (meaning this test can be used) for the duration of the  Covid-19 declaration under Section 564(b)(1) of the Act, 21  U.S.C. section 360bbb-3(b)(1), unless the authorization is  terminated or revoked. Performed at [facility], [address] [place], [place] [address]   Resp Panel by RT PCR (RSV, Flu A&B, Covid) -     Status: Abnormal   Collection Time: 12/22/19 11:36 AM  Result Value Ref Range Status   SARS Coronavirus 2 by RT PCR NEGATIVE NEGATIVE Final    Comment: (NOTE) SARS-CoV-2 target nucleic acids are NOT DETECTED. The SARS-CoV-2 RNA is generally detectable in upper respiratoy specimens during the acute phase of infection. The lowest concentration of SARS-CoV-2 viral copies this assay can detect is 131 copies/mL. A negative result does not preclude SARS-Cov-2 infection and should not be used as the sole basis for treatment or other patient management decisions. A negative result may occur with  improper specimen collection/handling, submission of specimen other than nasopharyngeal swab, presence of viral mutation(s) within the areas targeted by this assay, and inadequate number of viral copies (<131 copies/mL). A negative result must be combined with clinical observations, patient history, and epidemiological information. The expected result is Negative. Fact Sheet for Patients:  https://www.moore.com/ Fact Sheet for Healthcare Providers:  https://www.young.biz/ This test is not yet ap proved or cleared by the [place] FDA and  has been authorized  for detection and/or diagnosis of SARS-CoV-2 by FDA under an Emergency Use Authorization (EUA). This EUA will remain  in effect (meaning this test can be used) for the duration of the COVID-19 declaration under Section 564(b)(1) of the Act, 21 U.S.C. section 360bbb-3(b)(1), unless the authorization is terminated or revoked sooner.    Influenza A by PCR NEGATIVE NEGATIVE Final   Influenza B by PCR NEGATIVE NEGATIVE Final    Comment: (NOTE) The Xpert Xpress SARS-CoV-2/FLU/RSV assay is intended as an aid in  the diagnosis of influenza from Nasopharyngeal swab specimens and  should not be used as a sole basis for treatment. Nasal washings and  aspirates are unacceptable for Xpert Xpress SARS-CoV-2/FLU/RSV  testing. Fact Sheet for Patients: https://www.moore.com/ Fact Sheet for Healthcare Providers: https://www.young.biz/ This test is not yet approved or cleared by the [place] FDA and  has been authorized for detection and/or diagnosis of SARS-CoV-2 by  FDA under an Emergency Use Authorization (EUA). This EUA will remain  in effect (meaning this test can be used) for the duration of the  Covid-19 declaration under Section 564(b)(1) of the Act, 21  U.S.C. section 360bbb-3(b)(1), unless the authorization is  terminated or revoked.    Respiratory Syncytial Virus by PCR POSITIVE (A) NEGATIVE Final    Comment: (NOTE) Fact Sheet for Patients: https://www.moore.com/ Fact Sheet for Healthcare Providers: https://www.young.biz/ This test is not yet approved or cleared by the [place] FDA and  has been authorized for detection and/or diagnosis of SARS-CoV-2 by  FDA under an Emergency Use Authorization (EUA). This EUA will remain  in effect (meaning this test can be used) for the duration of the  COVID-19 declaration under Section 564(b)(1) of the Act, 21 U.S.C.  section 360bbb-3(b)(1),  unless the  authorization is terminated or  revoked. Performed at [facility], [address] [place], [place] [address]   Urine Culture     Status: None   Collection Time: 12/24/19 10:00 PM   Specimen: Urine, Random  Result Value Ref Range Status   Specimen Description   Final    URINE, RANDOM Performed at [facility], [address], [place], [place] [address]    Special Requests   Final    NONE Performed at [facility], [address], [place], [place] [address]    Culture   Final    NO GROWTH Performed at [facility], [address] [place], [place] [address]    Report Status 12/26/2019 FINAL  Final     Time coordinating discharge: Over 30 minutes  SIGNED:   Charise Killian, MD  [facility] 12/29/2019, 2:31 PM Pager   If 7PM-7AM, please contact night-coverage [email or web address]

## 2019-12-31 ENCOUNTER — Telehealth: Payer: Self-pay | Admitting: Gerontology

## 2019-12-31 NOTE — Telephone Encounter (Signed)
Made f/u visit for pt (after his hospital stay) for Thursday 5/6, at 1:30. Pt indicated that he needed acid reflux medication. He said that he may not be feeling well enough or have transportation. 4/29 at 10:45 - MD

## 2020-01-04 ENCOUNTER — Ambulatory Visit: Admit: 2020-01-04 | Payer: MEDICAID | Admitting: Gastroenterology

## 2020-01-04 SURGERY — COLONOSCOPY WITH PROPOFOL
Anesthesia: General

## 2020-01-06 ENCOUNTER — Telehealth: Payer: Self-pay

## 2020-01-06 NOTE — Telephone Encounter (Signed)
-----   Message from Wyline Mood, MD sent at 01/03/2020  7:23 PM EDT ----- Needs hep A/Bvaccine

## 2020-01-06 NOTE — Telephone Encounter (Signed)
Spoke with pt and informed him of lab results and Dr. Johnney Killian recommendation. Pt is not keen on receiving the vaccines. Pt also stated that he was told that he's high risk due to cardiac and pulmonary issues, so therefore, pt no longer wants to proceed with the colonoscopy procedure.

## 2020-01-07 ENCOUNTER — Other Ambulatory Visit: Payer: Self-pay

## 2020-01-07 ENCOUNTER — Ambulatory Visit: Payer: Self-pay | Admitting: Gerontology

## 2020-01-07 ENCOUNTER — Encounter: Payer: Self-pay | Admitting: Gerontology

## 2020-01-07 VITALS — BP 111/72 | HR 77 | Ht 67.0 in | Wt 277.0 lb

## 2020-01-07 DIAGNOSIS — I825Y2 Chronic embolism and thrombosis of unspecified deep veins of left proximal lower extremity: Secondary | ICD-10-CM

## 2020-01-07 DIAGNOSIS — Z09 Encounter for follow-up examination after completed treatment for conditions other than malignant neoplasm: Secondary | ICD-10-CM

## 2020-01-07 MED ORDER — RIVAROXABAN (XARELTO) VTE STARTER PACK (15 & 20 MG): ORAL_TABLET | ORAL | 2 refills | Status: DC

## 2020-01-07 MED ORDER — RIVAROXABAN 20 MG PO TABS: 20.0000 mg | ORAL_TABLET | Freq: Every day | ORAL | 5 refills | Status: DC

## 2020-01-07 NOTE — Progress Notes (Signed)
Established Patient Office Visit  Subjective:  Patient ID: Matthew [name], male    DOB: [birth year]  Age: 49 y.o. MRN: [id number]  CC:  Chief Complaint  Patient presents with  . hospital followup    HPI Matthew [name] presents for follow up after hospital discharge and medication refill. He states that he's compliant with his medication regimen. He was discharged on 12/29/2019 and during his one week hospital course, he was treated for Pulmonary embolism. Currently, he states that he uses 2 1/2 L of oxygen at night and denies shortness of breath, wheezing and chest tightness. His oxygen saturation during visit was 94% on room air. Per Discharge summary, he was to follow up with Pulmonologist and Cardiologist Dr Welton Flakes. He declines following up with either of the Specialists, stating that he is doing well. Overall, he states that he's doing well and offers no further complaint.  Past Medical History:  Diagnosis Date  . Arthritis   . Hx of blood clots   . Sleep apnea    patient states not anymore    Past Surgical History:  Procedure Laterality Date  . IVC FILTER INSERTION Left 11/24/2018   Procedure: IVC FILTER INSERTION;  Surgeon: Annice Needy, MD;  Location: [facility] INVASIVE CV LAB;  Service: Cardiovascular;  Laterality: Left;  . IVC FILTER INSERTION Right 02/18/2019   Procedure: IVC FILTER INSERTION WITH RIGHT LOWER EXTREMITY VENOUS LYSIS;  Surgeon: Renford Dills, MD;  Location: [facility] INVASIVE CV LAB;  Service: Cardiovascular;  Laterality: Right;  . IVC FILTER REMOVAL N/A 12/01/2018   Procedure: IVC FILTER REMOVAL;  Surgeon: Annice Needy, MD;  Location: [facility] INVASIVE CV LAB;  Service: Cardiovascular;  Laterality: N/A;  . NO PAST SURGERIES    . PERIPHERAL VASCULAR THROMBECTOMY Right 10/30/2019   Procedure: PERIPHERAL VASCULAR THROMBECTOMY / THROMBOLYSIS;  Surgeon: Annice Needy, MD;  Location: [facility] INVASIVE CV LAB;  Service: Cardiovascular;  Laterality: Right;  . PERIPHERAL VASCULAR  THROMBECTOMY Right 11/05/2019   Procedure: PERIPHERAL VASCULAR THROMBECTOMY / THROMBOLYSIS;  Surgeon: Annice Needy, MD;  Location: [facility] INVASIVE CV LAB;  Service: Cardiovascular;  Laterality: Right;    No family history on file.  Social History   Socioeconomic History  . Marital status: Married    Spouse name: Elease Hashimoto  . Number of children: 4  . Years of education: Not on file  . Highest education level: Not on file  Occupational History  . Not on file  Tobacco Use  . Smoking status: Current Every Day Smoker    Packs/day: 0.50    Types: Cigarettes  . Smokeless tobacco: Current User  Substance and Sexual Activity  . Alcohol use: Yes    Comment: weekends says every third weekend a 12 pack  . Drug use: Never  . Sexual activity: Yes  Other Topics Concern  . Not on file  Social History Narrative  . Not on file   Social Determinants of Health   Financial Resource Strain:   . Difficulty of Paying Living Expenses:   Food Insecurity:   . Worried About Programme researcher, broadcasting/film/video in the Last Year:   . Barista in the Last Year:   Transportation Needs:   . Freight forwarder (Medical):   Marland Kitchen Lack of Transportation (Non-Medical):   Physical Activity:   . Days of Exercise per Week:   . Minutes of Exercise per Session:   Stress:   . Feeling of Stress :   Social Connections:   .  Frequency of Communication with Friends and Family:   . Frequency of Social Gatherings with Friends and Family:   . Attends Religious Services:   . Active Member of Clubs or Organizations:   . Attends Banker Meetings:   Marland Kitchen Marital Status:   Intimate Partner Violence:   . Fear of Current or Ex-Partner:   . Emotionally Abused:   Marland Kitchen Physically Abused:   . Sexually Abused:     Outpatient Medications Prior to Visit  Medication Sig Dispense Refill  . albuterol (VENTOLIN HFA) 108 (90 Base) MCG/ACT inhaler Inhale 2 puffs into the lungs every 6 (six) hours as needed for wheezing or shortness  of breath. 18 g 0  . amiodarone (PACERONE) 200 MG tablet Take 1 tablet (200 mg total) by mouth daily. 30 tablet 0  . carvedilol (COREG) 6.25 MG tablet Take 1 tablet (6.25 mg total) by mouth 2 (two) times daily with a meal. 60 tablet 0  . diltiazem (CARDIZEM CD) 120 MG 24 hr capsule Take 1 capsule (120 mg total) by mouth daily. 30 capsule 0  . spironolactone (ALDACTONE) 25 MG tablet Take 0.5 tablets (12.5 mg total) by mouth daily. 15 tablet 0  . RIVAROXABAN (XARELTO) VTE STARTER PACK (15 & 20 MG TABLETS) Follow package directions: Take one 15mg  tablet by mouth twice a day. On day 22, switch to one 20mg  tablet once a day. Take with food. 51 each 0  . nicotine (NICODERM CQ - DOSED IN MG/24 HOURS) 14 mg/24hr patch One 14mg  patch chest wall daily (okay to substitute generic) (Patient not taking: Reported on 11/23/2019) 28 patch 0  . predniSONE (DELTASONE) 10 MG tablet 40mg  daily x 3 days, 30mg  daily x 3 days, 20mg  daily x 3 days, 10mg  daily x 3 days then stop 30 tablet 0   No facility-administered medications prior to visit.    Allergies  Allergen Reactions  . Amoxicillin Rash    ROS Review of Systems  Constitutional: Negative.   Respiratory: Negative.   Cardiovascular: Negative.   Neurological: Negative.   Hematological: Negative.       Objective:    Physical Exam  Constitutional: He is oriented to person, place, and time. He appears well-developed.  HENT:  Head: Normocephalic and atraumatic.  Eyes: Pupils are equal, round, and reactive to light. EOM are normal.  Cardiovascular: Normal rate and regular rhythm.  Pulmonary/Chest: Effort normal and breath sounds normal.  Neurological: He is alert and oriented to person, place, and time. He has normal reflexes.  Psychiatric: He has a normal mood and affect. His behavior is normal. Thought content normal.    BP 111/72 (BP Location: Right Arm, Patient Position: Sitting)   Pulse 77   Ht 5\' 7"  (1.702 m)   Wt 277 lb (125.6 kg)   SpO2  94%   BMI 43.38 kg/m  Wt Readings from Last 3 Encounters:  01/07/20 277 lb (125.6 kg)  12/29/19 270 lb 11.2 oz (122.8 kg)  12/22/19 283 lb (128.4 kg)   He gained 7 pounds in 1 week and was encouraged to continue on a weight loss regimen.  Health Maintenance Due  Topic Date Due  . COVID-19 Vaccine (1) Never done    There are no preventive care reminders to display for this patient.  Lab Results  Component Value Date   TSH 1.636 03/12/2019   Lab Results  Component Value Date   WBC 9.4 12/29/2019   HGB 17.8 (H) 12/29/2019   HCT 52.4 (H) 12/29/2019  MCV 97.9 12/29/2019   PLT 145 (L) 12/29/2019   Lab Results  Component Value Date   NA 138 12/29/2019   K 4.2 12/29/2019   CO2 38 (H) 12/29/2019   GLUCOSE 132 (H) 12/29/2019   BUN 21 (H) 12/29/2019   CREATININE 0.75 12/29/2019   BILITOT 0.7 12/19/2019   ALKPHOS 149 (H) 12/19/2019   AST 19 12/19/2019   ALT 16 12/19/2019   PROT 8.0 12/19/2019   ALBUMIN 3.4 (L) 12/19/2019   CALCIUM 8.7 (L) 12/29/2019   ANIONGAP 9 12/29/2019   Lab Results  Component Value Date   CHOL 155 09/10/2018   Lab Results  Component Value Date   HDL 38 (L) 09/10/2018   Lab Results  Component Value Date   LDLCALC 84 09/10/2018   Lab Results  Component Value Date   TRIG 163 (H) 09/10/2018   Lab Results  Component Value Date   CHOLHDL 4.1 09/10/2018   Lab Results  Component Value Date   HGBA1C 5.9 (H) 09/10/2018      Assessment & Plan:   1. Hospital discharge follow-up - He was encouraged to continue on his medication regimen, continue to use 2 1/2L oxygen as prescribed. He declines Pulmonology and Cardiology follow up.  2. Chronic deep vein thrombosis (DVT) of proximal vein of left lower extremity ([place]) - He will continue on his treatment regimen and was advised to go to the ED for acive bleeding. - rivaroxaban (XARELTO) 20 MG TABS tablet; Take 1 tablet (20 mg total) by mouth daily with supper.  Dispense: 30 tablet; Refill:  5     Follow-up: Return in about 13 weeks (around 04/07/2020), or if symptoms worsen or fail to improve.    Nastasia Kage Jerold Coombe, NP

## 2020-01-08 ENCOUNTER — Encounter (INDEPENDENT_AMBULATORY_CARE_PROVIDER_SITE_OTHER): Payer: Self-pay | Admitting: Nurse Practitioner

## 2020-02-08 ENCOUNTER — Ambulatory Visit: Payer: Self-pay | Admitting: Gastroenterology

## 2020-02-11 ENCOUNTER — Ambulatory Visit: Payer: Self-pay | Admitting: Gerontology

## 2020-02-12 ENCOUNTER — Other Ambulatory Visit: Payer: Self-pay

## 2020-02-12 ENCOUNTER — Ambulatory Visit (INDEPENDENT_AMBULATORY_CARE_PROVIDER_SITE_OTHER): Payer: Self-pay | Admitting: Nurse Practitioner

## 2020-02-12 ENCOUNTER — Encounter (INDEPENDENT_AMBULATORY_CARE_PROVIDER_SITE_OTHER): Payer: Self-pay | Admitting: Nurse Practitioner

## 2020-02-12 ENCOUNTER — Ambulatory Visit (INDEPENDENT_AMBULATORY_CARE_PROVIDER_SITE_OTHER): Payer: Self-pay

## 2020-02-12 VITALS — BP 123/78 | HR 79 | Ht 67.0 in | Wt 287.0 lb

## 2020-02-12 DIAGNOSIS — Z720 Tobacco use: Secondary | ICD-10-CM

## 2020-02-12 DIAGNOSIS — I2699 Other pulmonary embolism without acute cor pulmonale: Secondary | ICD-10-CM

## 2020-02-12 DIAGNOSIS — I82411 Acute embolism and thrombosis of right femoral vein: Secondary | ICD-10-CM

## 2020-02-12 DIAGNOSIS — I824Y1 Acute embolism and thrombosis of unspecified deep veins of right proximal lower extremity: Secondary | ICD-10-CM

## 2020-02-15 ENCOUNTER — Encounter (INDEPENDENT_AMBULATORY_CARE_PROVIDER_SITE_OTHER): Payer: Self-pay | Admitting: Nurse Practitioner

## 2020-02-15 NOTE — Progress Notes (Signed)
Subjective:    Patient ID: Matthew [name], male    DOB: [birth year], 49 y.o.   MRN: [id number] Chief Complaint  Patient presents with  . Follow-up    U/S follow up    [name] is a 49 y.o. male that initially presented to [facility] on 10/29/2019 with pain and swelling and was found to have extensive DVT extending from his iliac to popliteal veins.  The patient underwent thrombectomy on 10/30/2019.  The patient had an IVC filter in place that was patent.  The patient subsequently presented to [facility] again on 11/02/2019 with a recurrence of this right lower extremity DVT.  The patient underwent thrombectomy again as well as angioplasty of the iliac and femoral veins.  The patient states that he was on Xarelto and was compliant however the patient has had history of noncompliance in the past.  The patient was recently admitted to [facility] on 12/19/2019 due to having shortness of breath and was found to have a pulmonary embolism.  The patient also has a history of atrial fibrillation.  The patient states that he left AMA because "they wanted to put a tube in his nose and down his throat". The patient subsequently had to be admitted to the hospital 12/22/2019 due to decompensating from the previously noted pulmonary embolism.  Since that time the patient notes that he has been doing well. The patient notes that he does have lower extremity edema however this is constant for him. However he denies any extensive discomfort. Admits that he does not wear medical grade 1 compression stockings, and does not plan to. The patient states that he is still been taking his Xarelto.  Today noninvasive studies show findings consistent with an acute deep vein thrombus in the right common femoral vein, saphenofemoral junction, right femoral vein and right popliteal vein. The common femoral vein has some recannulized flow however the rest of the  deep venous system to the knee shows total occlusion. The great saphenous vein has partial compression with a chronic appearing thrombus.   Review of Systems  Cardiovascular: Positive for leg swelling.  Skin: Positive for wound.  All other systems reviewed and are negative.      Objective:   Physical Exam Vitals reviewed.  HENT:     Head: Normocephalic.  Cardiovascular:     Rate and Rhythm: Normal rate and regular rhythm.     Pulses: Normal pulses.     Heart sounds: Normal heart sounds.  Pulmonary:     Effort: Pulmonary effort is normal.     Breath sounds: Normal breath sounds.  Musculoskeletal:     Right lower leg: 3+ Edema present.     Left lower leg: 3+ Edema present.  Neurological:     Mental Status: He is alert and oriented to person, place, and time.  Psychiatric:        Attention and Perception: Attention normal.        Mood and Affect: Mood normal.        Speech: Speech normal.        Behavior: Behavior is agitated.        Thought Content: Thought content normal.     BP 123/78   Pulse 79   Ht 5\' 7"  (1.702 m)   Wt 287 lb (130.2 kg)   BMI 44.95 kg/m   Past Medical History:  Diagnosis Date  . Arthritis   . Hx of blood clots   .  Sleep apnea    patient states not anymore    Social History   Socioeconomic History  . Marital status: Married    Spouse name: Elease Hashimoto  . Number of children: 4  . Years of education: Not on file  . Highest education level: Not on file  Occupational History  . Not on file  Tobacco Use  . Smoking status: Current Every Day Smoker    Packs/day: 0.50    Types: Cigarettes  . Smokeless tobacco: Current User  Vaping Use  . Vaping Use: Former  Substance and Sexual Activity  . Alcohol use: Yes    Comment: weekends says every third weekend a 12 pack  . Drug use: Never  . Sexual activity: Yes  Other Topics Concern  . Not on file  Social History Narrative  . Not on file   Social Determinants of Health   Financial  Resource Strain:   . Difficulty of Paying Living Expenses:   Food Insecurity:   . Worried About Programme researcher, broadcasting/film/video in the Last Year:   . Barista in the Last Year:   Transportation Needs:   . Freight forwarder (Medical):   Marland Kitchen Lack of Transportation (Non-Medical):   Physical Activity:   . Days of Exercise per Week:   . Minutes of Exercise per Session:   Stress:   . Feeling of Stress :   Social Connections:   . Frequency of Communication with Friends and Family:   . Frequency of Social Gatherings with Friends and Family:   . Attends Religious Services:   . Active Member of Clubs or Organizations:   . Attends Banker Meetings:   Marland Kitchen Marital Status:   Intimate Partner Violence:   . Fear of Current or Ex-Partner:   . Emotionally Abused:   Marland Kitchen Physically Abused:   . Sexually Abused:     Past Surgical History:  Procedure Laterality Date  . IVC FILTER INSERTION Left 11/24/2018   Procedure: IVC FILTER INSERTION;  Surgeon: Annice Needy, MD;  Location: [facility] INVASIVE CV LAB;  Service: Cardiovascular;  Laterality: Left;  . IVC FILTER INSERTION Right 02/18/2019   Procedure: IVC FILTER INSERTION WITH RIGHT LOWER EXTREMITY VENOUS LYSIS;  Surgeon: Renford Dills, MD;  Location: [facility] INVASIVE CV [facility];  Service: Cardiovascular;  Laterality: Right;  . IVC FILTER REMOVAL N/A 12/01/2018   Procedure: IVC FILTER REMOVAL;  Surgeon: Annice Needy, MD;  Location: [facility] INVASIVE CV [facility];  Service: Cardiovascular;  Laterality: N/A;  . NO PAST SURGERIES    . PERIPHERAL VASCULAR THROMBECTOMY Right 10/30/2019   Procedure: PERIPHERAL VASCULAR THROMBECTOMY / THROMBOLYSIS;  Surgeon: Annice Needy, MD;  Location: [facility] INVASIVE CV [facility];  Service: Cardiovascular;  Laterality: Right;  . PERIPHERAL VASCULAR THROMBECTOMY Right 11/05/2019   Procedure: PERIPHERAL VASCULAR THROMBECTOMY / THROMBOLYSIS;  Surgeon: Annice Needy, MD;  Location: [facility] INVASIVE CV [facility];  Service: Cardiovascular;  Laterality: Right;      History reviewed. No pertinent family history.  Allergies  Allergen Reactions  . Amoxicillin Rash       Assessment & Plan:   1. Acute deep vein thrombosis (DVT) of femoral vein of right lower extremity (HCC) Unfortunately the patient continues to have evidence of an acute thrombus in his right lower extremity.  However, the patient maintains that he has been consistent with his Xarelto.  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  I discussed with the patient that generally, DVTs  transition from an acute to a chronic process within 6 to 8 weeks however this has been ongoing for the patient for several months.  I suggested that the patient be referred to hematology to look for any sort of hypercoagulable disorders however the patient refuses to do so at this time.  The patient also has a patent IVC filter in place.  The patient still maintains leg swelling, however he refuses to wear compression as he "prefers to wear flip-flops".  The patient notes that he continues to be active and he elevates when possible.  The patient is advised to follow-up in 6 months if swelling becomes worse.  He is advised to present to the emergency room if he begins to have chest pain or shortness of breath.  2. Acute pulmonary embolism, unspecified pulmonary embolism type, unspecified whether acute cor pulmonale present [facility]) The patient does not believe that he had an acute pulmonary embolism, rather a COPD exacerbation.  It was recommended that the patient see cardiology as well as pulmonology however the patient does not wish to see either.  The patient also refer used rehabilitation as well.  3. Tobacco abuse Smoking cessation was discussed, 3-10 minutes spent on this topic specifically    Current Outpatient Medications on File Prior to Visit  Medication Sig Dispense Refill  . albuterol (VENTOLIN HFA) 108 (90 Base) MCG/ACT inhaler Inhale 2 puffs into the lungs every 6 (six)  hours as needed for wheezing or shortness of breath. 18 g 0  . nicotine (NICODERM CQ - DOSED IN MG/24 HOURS) 14 mg/24hr patch One 14mg  patch chest wall daily (okay to substitute generic) 28 patch 0  . rivaroxaban (XARELTO) 20 MG TABS tablet Take 1 tablet (20 mg total) by mouth daily with supper. 30 tablet 5  . amiodarone (PACERONE) 200 MG tablet Take 1 tablet (200 mg total) by mouth daily. 30 tablet 0  . carvedilol (COREG) 6.25 MG tablet Take 1 tablet (6.25 mg total) by mouth 2 (two) times daily with a meal. 60 tablet 0  . diltiazem (CARDIZEM CD) 120 MG 24 hr capsule Take 1 capsule (120 mg total) by mouth daily. 30 capsule 0  . spironolactone (ALDACTONE) 25 MG tablet Take 0.5 tablets (12.5 mg total) by mouth daily. 15 tablet 0   No current facility-administered medications on file prior to visit.    There are no Patient Instructions on file for this visit. No follow-ups on file.   , NP

## 2020-02-25 ENCOUNTER — Other Ambulatory Visit: Payer: Self-pay | Admitting: Gerontology

## 2020-03-01 ENCOUNTER — Telehealth: Payer: Self-pay | Admitting: Gerontology

## 2020-03-01 NOTE — Telephone Encounter (Addendum)
Called pt and call could not be completed. ----- Message from Benjamin Stain, CMA sent at 03/01/2020  9:38 AM EDT ----- Regarding: appt Please call patient to move 8/5 appt with Eliz to sometime in the next 2 weeks. Per Philis Nettle.

## 2020-03-02 NOTE — Telephone Encounter (Addendum)
Call could not be completed  ----- Message from Benjamin Stain, CMA sent at 03/01/2020  9:38 AM EDT ----- Regarding: appt Please call patient to move 8/5 appt with Eliz to sometime in the next 2 weeks. Per Philis Nettle.

## 2020-03-03 ENCOUNTER — Telehealth: Payer: Self-pay | Admitting: Gerontology

## 2020-03-03 NOTE — Telephone Encounter (Addendum)
Pt call could not be completed.  ----- Message from Benjamin Stain, CMA sent at 03/01/2020  9:38 AM EDT ----- Regarding: appt Please call patient to move 8/5 appt with Eliz to sometime in the next 2 weeks. Per Philis Nettle.

## 2020-03-03 NOTE — Telephone Encounter (Addendum)
Call could not be completed  ----- Message from Jennifer Rolen, CMA sent at 03/01/2020  9:38 AM EDT ----- Regarding: appt Please call patient to move 8/5 appt with Eliz to sometime in the next 2 weeks. Per Eliz.   

## 2020-03-08 ENCOUNTER — Telehealth: Payer: Self-pay | Admitting: Gerontology

## 2020-03-09 ENCOUNTER — Telehealth: Payer: Self-pay | Admitting: Gerontology

## 2020-03-09 NOTE — Telephone Encounter (Signed)
Call cannot be completed  

## 2020-03-15 NOTE — Telephone Encounter (Signed)
Phone number did not work

## 2020-03-29 ENCOUNTER — Ambulatory Visit: Payer: Self-pay | Admitting: Gerontology

## 2020-03-30 ENCOUNTER — Ambulatory Visit: Payer: Self-pay | Admitting: Gerontology

## 2020-03-30 ENCOUNTER — Other Ambulatory Visit: Payer: Self-pay

## 2020-03-30 VITALS — BP 103/67 | HR 71 | Ht 67.0 in | Wt 298.0 lb

## 2020-03-30 DIAGNOSIS — Z8669 Personal history of other diseases of the nervous system and sense organs: Secondary | ICD-10-CM | POA: Insufficient documentation

## 2020-03-30 DIAGNOSIS — R600 Localized edema: Secondary | ICD-10-CM | POA: Insufficient documentation

## 2020-03-30 MED ORDER — SPIRONOLACTONE 25 MG PO TABS: 12.5000 mg | ORAL_TABLET | Freq: Every day | ORAL | 0 refills | Status: DC

## 2020-03-30 NOTE — Progress Notes (Signed)
Established Patient Office Visit  Subjective:  Patient ID: Matthew [name], male    DOB: 1971/08/20  Age: 49 y.o. MRN: [id number]  CC:  Chief Complaint  Patient presents with  . Sleep Apnea  . leg sores    once bleeding spots, it starts oozing "water"    HPI Matthew [name] presents for c/o edema to bilateral lower legs and clear exudates from 3 small ope sites to his lower legs. He states that intermittent leg edema has been going on since his hospital discharge in April but it has worsened since last week. He reports that he is compliant with taking Diltiazem and Xarelto.  He was [facility] on 02/12/2020 by Rushie Nyhan NP and per note, he has patent IVC filter in place. Currently, he states that he doesn't wear his compression stockings because it's very tight. He also declines Cardiology referral. He denies shortness of breath,chest pain, palpitation, orthopnea, erythema and claudication. He also has a history of sleep apnea and uses CPAP and 2 L oxygen at night. He states that his mask leaks and request for replacement. Overall he states that he's doing well, agrees to follow up with a Cardiologist and offers no further complaint.  Past Medical History:  Diagnosis Date  . Arthritis   . Hx of blood clots   . Sleep apnea    patient states not anymore    Past Surgical History:  Procedure Laterality Date  . IVC FILTER INSERTION Left 11/24/2018   Procedure: IVC FILTER INSERTION;  Surgeon: Algernon Huxley, MD;  Location: [place] [facility];  Service: Cardiovascular;  Laterality: Left;  . IVC FILTER INSERTION Right 02/18/2019   Procedure: IVC FILTER INSERTION WITH RIGHT LOWER EXTREMITY VENOUS LYSIS;  Surgeon: Katha Cabal, MD;  Location: [place] [facility];  Service: Cardiovascular;  Laterality: Right;  . IVC FILTER REMOVAL N/A 12/01/2018   Procedure: IVC FILTER REMOVAL;  Surgeon: Algernon Huxley, MD;  Location: [place] [facility];  Service: Cardiovascular;   Laterality: N/A;  . NO PAST SURGERIES    . PERIPHERAL VASCULAR THROMBECTOMY Right 10/30/2019   Procedure: PERIPHERAL VASCULAR THROMBECTOMY / THROMBOLYSIS;  Surgeon: Algernon Huxley, MD;  Location: [place] [facility];  Service: Cardiovascular;  Laterality: Right;  . PERIPHERAL VASCULAR THROMBECTOMY Right 11/05/2019   Procedure: PERIPHERAL VASCULAR THROMBECTOMY / THROMBOLYSIS;  Surgeon: Algernon Huxley, MD;  Location: [place] [facility];  Service: Cardiovascular;  Laterality: Right;    No family history on file.  Social History   Socioeconomic History  . Marital status: Married    Spouse name: [name]  . Number of children: 4  . Years of education: Not on file  . Highest education level: Not on file  Occupational History  . Not on file  Tobacco Use  . Smoking status: Current Every Day Smoker    Packs/day: 0.50    Types: Cigarettes  . Smokeless tobacco: Current User  Vaping Use  . Vaping Use: Former  Substance and Sexual Activity  . Alcohol use: Yes    Comment: weekends says every third weekend a 12 pack  . Drug use: Never  . Sexual activity: Yes  Other Topics Concern  . Not on file  Social History Narrative  . Not on file   Social Determinants of Health   Financial Resource Strain:   . Difficulty of Paying Living Expenses:   Food Insecurity:   . Worried About Charity fundraiser in the Last  Year:   . Ran Out of Food in the Last Year:   Transportation Needs:   . Film/video editor (Medical):   Marland Kitchen Lack of Transportation (Non-Medical):   Physical Activity:   . Days of Exercise per Week:   . Minutes of Exercise per Session:   Stress:   . Feeling of Stress :   Social Connections:   . Frequency of Communication with Friends and Family:   . Frequency of Social Gatherings with Friends and Family:   . Attends Religious Services:   . Active Member of Clubs or Organizations:   . Attends Archivist Meetings:   Marland Kitchen Marital Status:   Intimate Partner Violence:   .  Fear of Current or Ex-Partner:   . Emotionally Abused:   Marland Kitchen Physically Abused:   . Sexually Abused:     Outpatient Medications Prior to Visit  Medication Sig Dispense Refill  . diltiazem (CARDIZEM CD) 120 MG 24 hr capsule Take 1 capsule (120 mg total) by mouth daily. 30 capsule 0  . rivaroxaban (XARELTO) 20 MG TABS tablet Take 1 tablet (20 mg total) by mouth daily with supper. 30 tablet 5  . albuterol (VENTOLIN HFA) 108 (90 Base) MCG/ACT inhaler Inhale 2 puffs into the lungs every 6 (six) hours as needed for wheezing or shortness of breath. (Patient not taking: Reported on 03/30/2020) 18 g 0  . nicotine (NICODERM CQ - DOSED IN MG/24 HOURS) 14 mg/24hr patch One 34m patch chest wall daily (okay to substitute generic) (Patient not taking: Reported on 03/30/2020) 28 patch 0  . amiodarone (PACERONE) 200 MG tablet Take 1 tablet (200 mg total) by mouth daily. 30 tablet 0  . carvedilol (COREG) 6.25 MG tablet Take 1 tablet (6.25 mg total) by mouth 2 (two) times daily with a meal. 60 tablet 0  . diltiazem (TIAZAC) 120 MG 24 hr capsule TAKE ONE CAPSULE BY MOUTH EVERY DAY 30 capsule 0  . spironolactone (ALDACTONE) 25 MG tablet Take 0.5 tablets (12.5 mg total) by mouth daily. (Patient not taking: Reported on 03/30/2020) 15 tablet 0   No facility-administered medications prior to visit.    Allergies  Allergen Reactions  . Amoxicillin Rash    ROS Review of Systems  Constitutional: Negative.   Respiratory: Negative.   Cardiovascular: Positive for leg swelling.  Skin: Positive for wound (3 small open sites to right leg, draining small amount of clear exudate.).  Neurological: Negative.   Hematological: Negative.       Objective:    Physical Exam Cardiovascular:     Rate and Rhythm: Rhythm irregular.     Pulses: Normal pulses.     Heart sounds: Normal heart sounds.  Pulmonary:     Effort: Pulmonary effort is normal.     Breath sounds: Normal breath sounds.  Musculoskeletal:     Right lower  leg: Edema (+2 pitting edema) present.     Left lower leg: Edema (+2 pitting edema) present.  Skin:    General: Skin is warm.     Findings: Lesion (3 small ope sites to right lower leg, draining small amount of clear exudate) present. No erythema.  Neurological:     General: No focal deficit present.     Mental Status: He is alert and oriented to person, place, and time. Mental status is at baseline.     BP 103/67 (BP Location: Right Arm, Patient Position: Sitting, Cuff Size: Large)   Pulse 71   Ht _0  (1.702 m)  Wt (!) 298 lb (135.2 kg)   SpO2 93%   BMI 46.67 kg/m  Wt Readings from Last 3 Encounters:  03/30/20 (!) 298 lb (135.2 kg)  02/12/20 287 lb (130.2 kg)  01/07/20 277 lb (125.6 kg)   He was encouraged to continue on his weight loss regimen.  Health Maintenance Due  Topic Date Due  . COVID-19 Vaccine (1) Never done    There are no preventive care reminders to display for this patient.  Lab Results  Component Value Date   TSH 1.636 03/12/2019   Lab Results  Component Value Date   WBC 9.4 12/29/2019   HGB 17.8 (H) 12/29/2019   HCT 52.4 (H) 12/29/2019   MCV 97.9 12/29/2019   PLT 145 (L) 12/29/2019   Lab Results  Component Value Date   NA 138 12/29/2019   K 4.2 12/29/2019   CO2 38 (H) 12/29/2019   GLUCOSE 132 (H) 12/29/2019   BUN 21 (H) 12/29/2019   CREATININE 0.75 12/29/2019   BILITOT 0.7 12/19/2019   ALKPHOS 149 (H) 12/19/2019   AST 19 12/19/2019   ALT 16 12/19/2019   PROT 8.0 12/19/2019   ALBUMIN 3.4 (L) 12/19/2019   CALCIUM 8.7 (L) 12/29/2019   ANIONGAP 9 12/29/2019   Lab Results  Component Value Date   CHOL 155 09/10/2018   Lab Results  Component Value Date   HDL 38 (L) 09/10/2018   Lab Results  Component Value Date   LDLCALC 84 09/10/2018   Lab Results  Component Value Date   TRIG 163 (H) 09/10/2018   Lab Results  Component Value Date   CHOLHDL 4.1 09/10/2018   Lab Results  Component Value Date   HGBA1C 5.9 (H) 09/10/2018       Assessment & Plan:   1. Bilateral edema of lower extremity - He will start 12.5 mg of Spironolactone, was educated on medication side effects, advised to notify clinic. Monitor blood pressure daily. He was strongly advised to wear compression stocking, elevate legs while sitting down and exercise as tolerated. He agrees to follow up with Cardiology. Unable to check labs today, was advised to go to the ED for worsening edema, shortness of breath, claudication and low urine output. - He was provided with gauze for small open lesion dressing changes. - spironolactone (ALDACTONE) 25 MG tablet; Take 0.5 tablets (12.5 mg total) by mouth daily for 7 days.  Dispense: 4 tablet; Refill: 0 - Ambulatory referral to Cardiology - Comp Met (CMET); Future - Urinalysis; Future  B Nat Peptide; Future   2. History of sleep apnea  - He was advised to complete [facility] financial application for Ambulatory referral to Neurology, for evaluation and treatment of Sleep apnea.     Follow-up: Return in about 8 days (around 04/07/2020), or if symptoms worsen or fail to improve.    Suhaan Perleberg Jerold Coombe, NP

## 2020-03-30 NOTE — Patient Instructions (Signed)
Edema  Edema is when you have too much fluid in your body or under your skin. Edema may make your legs, feet, and ankles swell up. Swelling is also common in looser tissues, like around your eyes. This is a common condition. It gets more common as you get older. There are many possible causes of edema. Eating too much salt (sodium) and being on your feet or sitting for a long time can cause edema in your legs, feet, and ankles. Hot weather may make edema worse. Edema is usually painless. Your skin may look swollen or shiny. Follow these instructions at home:  Keep the swollen body part raised (elevated) above the level of your heart when you are sitting or lying down.  Do not sit still or stand for a long time.  Do not wear tight clothes. Do not wear garters on your upper legs.  Exercise your legs. This can help the swelling go down.  Wear elastic bandages or support stockings as told by your doctor.  Eat a low-salt (low-sodium) diet to reduce fluid as told by your doctor.  Depending on the cause of your swelling, you may need to limit how much fluid you drink (fluid restriction).  Take over-the-counter and prescription medicines only as told by your doctor. Contact a doctor if:  Treatment is not working.  You have heart, liver, or kidney disease and have symptoms of edema.  You have sudden and unexplained weight gain. Get help right away if:  You have shortness of breath or chest pain.  You cannot breathe when you lie down.  You have pain, redness, or warmth in the swollen areas.  You have heart, liver, or kidney disease and get edema all of a sudden.  You have a fever and your symptoms get worse all of a sudden. Summary  Edema is when you have too much fluid in your body or under your skin.  Edema may make your legs, feet, and ankles swell up. Swelling is also common in looser tissues, like around your eyes.  Raise (elevate) the swollen body part above the level of your  heart when you are sitting or lying down.  Follow your doctor's instructions about diet and how much fluid you can drink (fluid restriction). This information is not intended to replace advice given to you by your health care provider. Make sure you discuss any questions you have with your health care provider. Document Revised: 08/23/2017 Document Reviewed: 09/07/2016 Elsevier Patient Education  2020 Elsevier Inc.  

## 2020-04-06 ENCOUNTER — Other Ambulatory Visit: Payer: Self-pay

## 2020-04-06 DIAGNOSIS — D751 Secondary polycythemia: Secondary | ICD-10-CM

## 2020-04-06 DIAGNOSIS — R600 Localized edema: Secondary | ICD-10-CM

## 2020-04-06 DIAGNOSIS — R7303 Prediabetes: Secondary | ICD-10-CM

## 2020-04-07 ENCOUNTER — Ambulatory Visit: Payer: Self-pay | Admitting: Gerontology

## 2020-04-08 LAB — URINALYSIS
Bilirubin, UA: NEGATIVE
Glucose, UA: NEGATIVE
Ketones, UA: NEGATIVE
Leukocytes,UA: NEGATIVE
Nitrite, UA: NEGATIVE
Protein,UA: NEGATIVE
RBC, UA: NEGATIVE
Specific Gravity, UA: 1.02 (ref 1.005–1.030)
Urobilinogen, Ur: 0.2 mg/dL (ref 0.2–1.0)
pH, UA: 5.5 (ref 5.0–7.5)

## 2020-04-08 LAB — COMPREHENSIVE METABOLIC PANEL
ALT: 12 IU/L (ref 0–44)
AST: 18 IU/L (ref 0–40)
Albumin/Globulin Ratio: 1 — ABNORMAL LOW (ref 1.2–2.2)
Albumin: 3.8 g/dL — ABNORMAL LOW (ref 4.0–5.0)
Alkaline Phosphatase: 213 IU/L — ABNORMAL HIGH (ref 48–121)
BUN/Creatinine Ratio: 14 (ref 9–20)
BUN: 12 mg/dL (ref 6–24)
Bilirubin Total: 0.3 mg/dL (ref 0.0–1.2)
CO2: 27 mmol/L (ref 20–29)
Calcium: 9.5 mg/dL (ref 8.7–10.2)
Chloride: 99 mmol/L (ref 96–106)
Creatinine, Ser: 0.85 mg/dL (ref 0.76–1.27)
GFR calc Af Amer: 118 mL/min/{1.73_m2} (ref >59)
GFR calc non Af Amer: 102 mL/min/{1.73_m2} (ref >59)
Globulin, Total: 3.9 g/dL (ref 1.5–4.5)
Glucose: 80 mg/dL (ref 65–99)
Potassium: 4.4 mmol/L (ref 3.5–5.2)
Sodium: 144 mmol/L (ref 134–144)
Total Protein: 7.7 g/dL (ref 6.0–8.5)

## 2020-04-08 LAB — BRAIN NATRIURETIC PEPTIDE: BNP: 18.9 pg/mL (ref 0.0–100.0)

## 2020-04-08 LAB — HEMOGLOBIN A1C
Est. average glucose Bld gHb Est-mCnc: 120 mg/dL
Hgb A1c MFr Bld: 5.8 % — ABNORMAL HIGH (ref 4.8–5.6)

## 2020-04-12 ENCOUNTER — Other Ambulatory Visit: Payer: Self-pay

## 2020-04-12 ENCOUNTER — Ambulatory Visit: Payer: Self-pay | Admitting: Gerontology

## 2020-04-28 ENCOUNTER — Other Ambulatory Visit: Payer: Self-pay | Admitting: Gerontology

## 2020-05-19 IMAGING — US VENOUS DOPPLER ULTRASOUND OF  LOWER EXTREMITIES
2 series · 13 of 24 positions shown · non-contrast
Comparison: Left lower extremity venous Doppler
ultrasound-03/04/2019; 11/19/2018

CLINICAL DATA: History of prior DVT with persistent bilateral lower
extremity edema. Evaluate for acute or chronic DVT.



[Series 1: venous doppler ultrasound of lower extremities · 11 of 63 slices shown (1 of 2)]
[im 1/63]
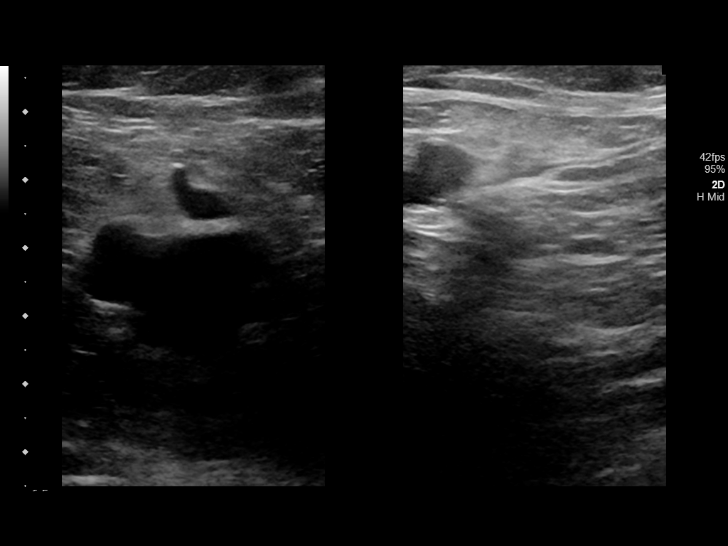
[im 7/63]
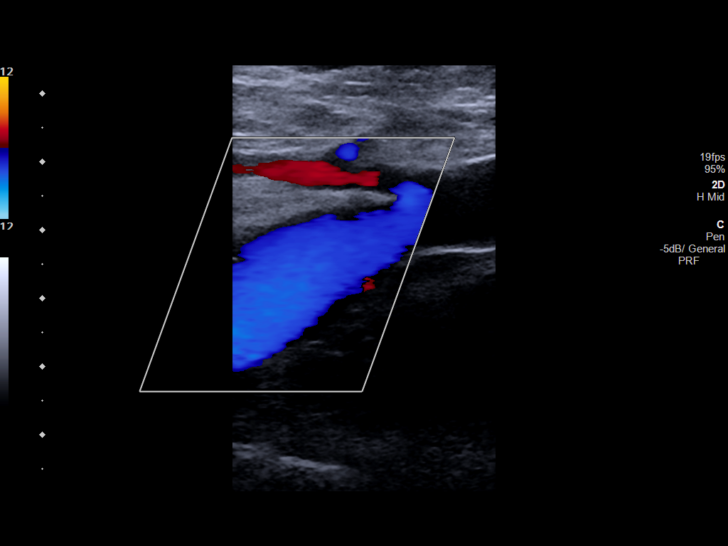
[im 13/63]
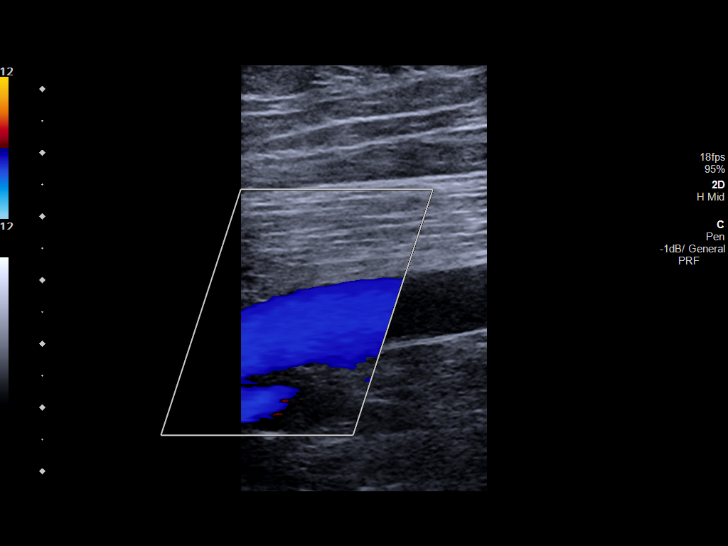
[im 19/63]
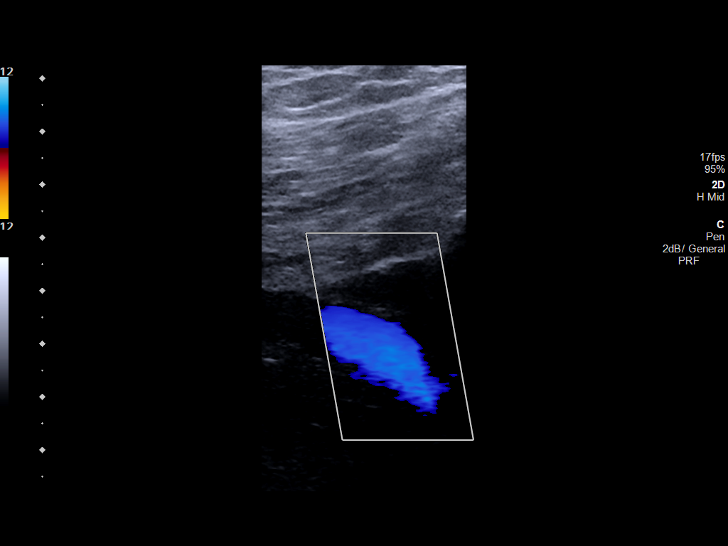
[im 25/63]
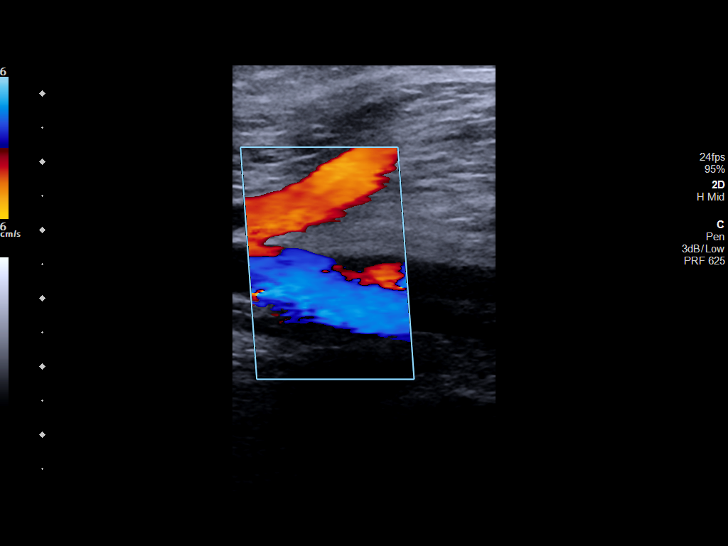
[im 32/63]
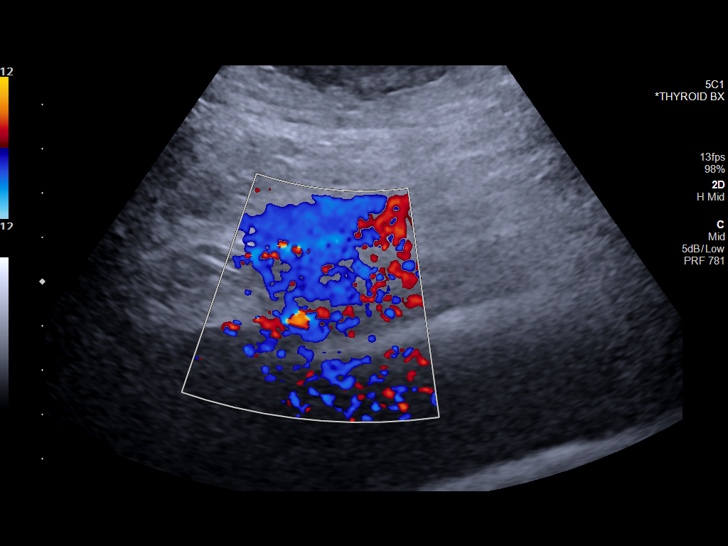
[im 38/63]
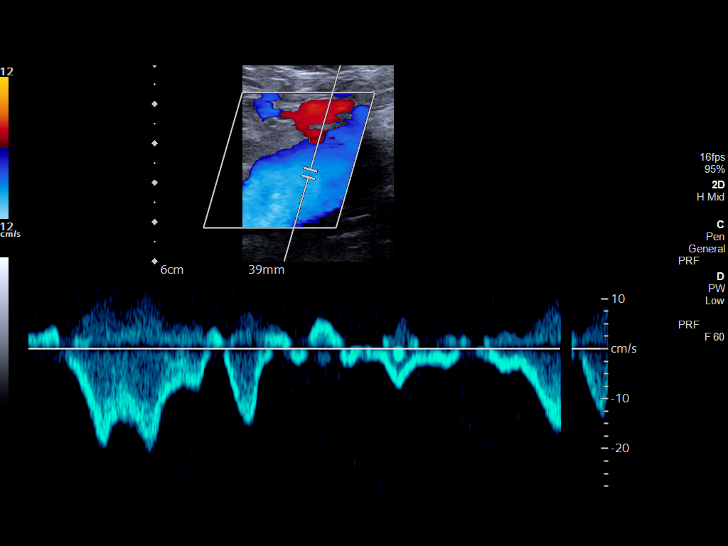
[im 41/63]
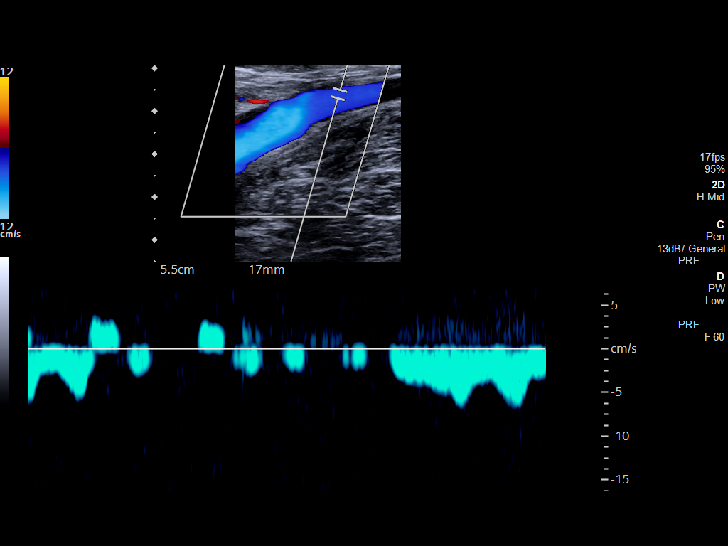
[im 47/63]
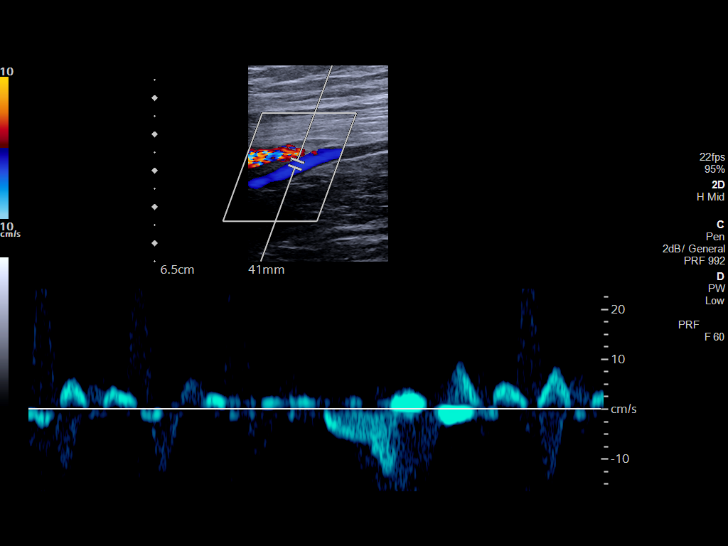
[im 53/63]
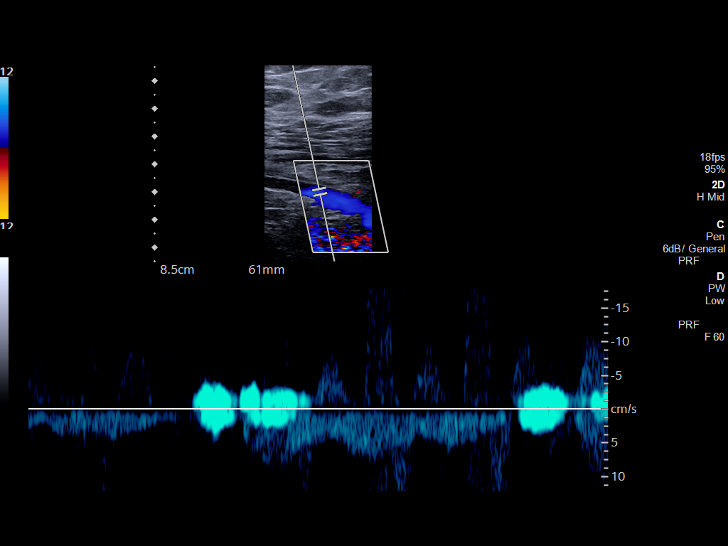
[im 59/63]
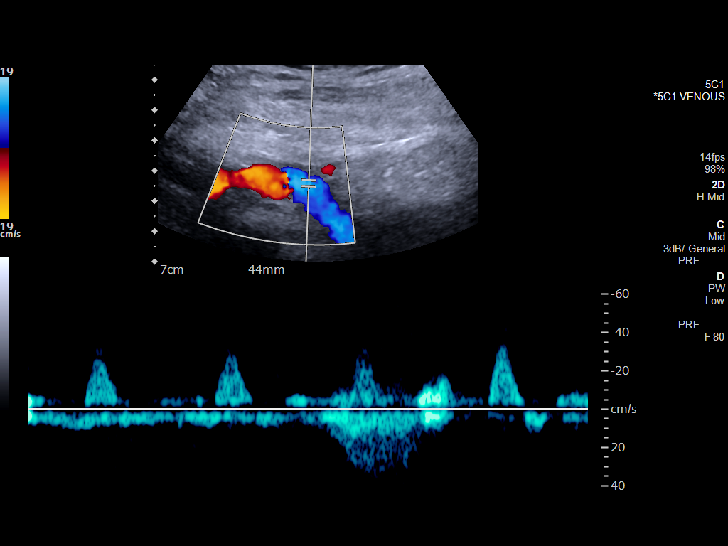

[Series 2: venous doppler ultrasound of lower extremities · 2 of 8 slices shown (2 of 2)]
[im 1/8]
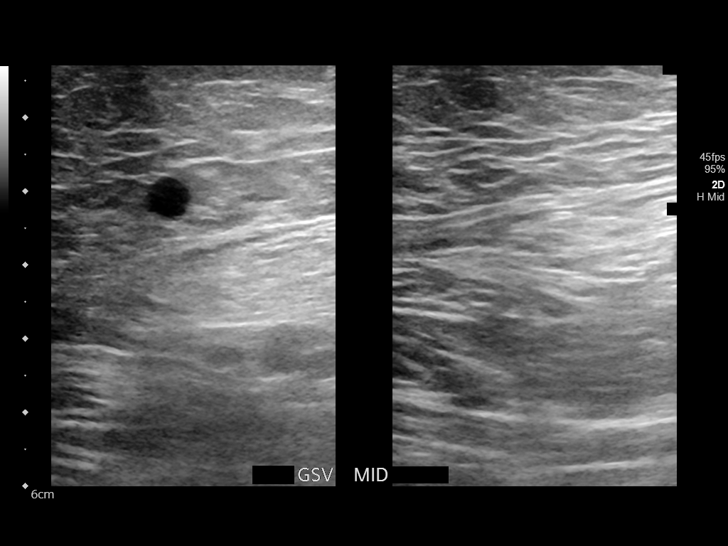
[im 8/8]
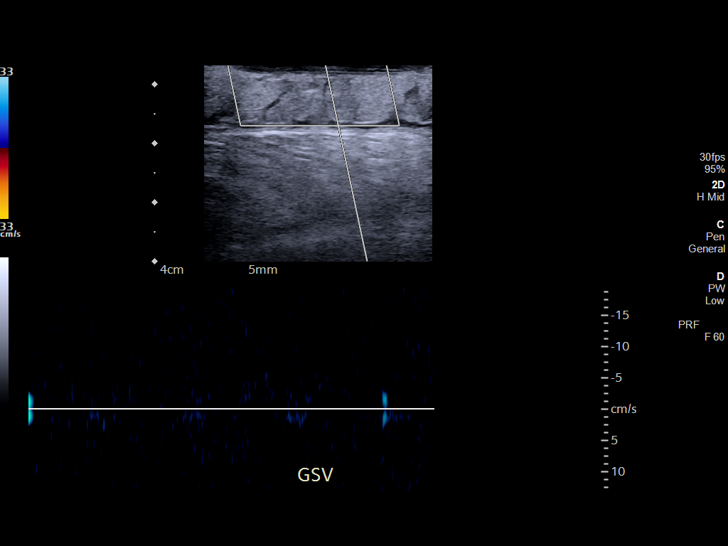

[13 of 24 positions shown; findings below may reference images not displayed]

FINDINGS: RIGHT LOWER EXTREMITY

Common Femoral Vein: No evidence of thrombus. Normal
compressibility, respiratory phasicity and response to augmentation.

Saphenofemoral Junction: No evidence of thrombus. Normal
compressibility and flow on color Doppler imaging.

Profunda Femoral Vein: No evidence of thrombus. Normal
compressibility and flow on color Doppler imaging.

Femoral Vein: No evidence of thrombus. Normal compressibility,
respiratory phasicity and response to augmentation.

Popliteal Vein: No evidence of thrombus. Normal compressibility,
respiratory phasicity and response to augmentation.

Calf Veins: Appears patent where imaged.

Superficial Great Saphenous Vein: No evidence of thrombus. Normal
compressibility.

Venous Reflux:  None.

Other Findings:  None.

LEFT LOWER EXTREMITY

Common Femoral Vein: No evidence of thrombus. Normal
compressibility, respiratory phasicity and response to augmentation.

Saphenofemoral Junction: No evidence of thrombus. Normal
compressibility and flow on color Doppler imaging.

Profunda Femoral Vein: No evidence of thrombus. Normal
compressibility and flow on color Doppler imaging.

Femoral Vein: No evidence of thrombus. Normal compressibility,
respiratory phasicity and response to augmentation.

Popliteal Vein: No evidence of thrombus. Normal compressibility,
respiratory phasicity and response to augmentation.

Calf Veins: Appears patent where imaged.

Superficial Great Saphenous Vein: Not imaged on the present
examination.

Venous Reflux:  None.

Other Findings:  None.
IMPRESSION: 1. No definite evidence of acute or chronic DVT within either lower
extremity with special attention paid to the left femoral vein.
2. Left greater saphenous vein was not imaged the present
examination though was noted to contain occlusive superficial
thrombophlebitis on the 03/04/2019 examination.

## 2020-05-20 IMAGING — CR CHEST - 2 VIEW
2 series · 2 of 2 positions shown · non-contrast
Comparison: Radiographs and CT scan March 10, 2019.

CLINICAL DATA: Hypoxia.

EXAM:
CHEST - 2 VIEW

[chest lat]
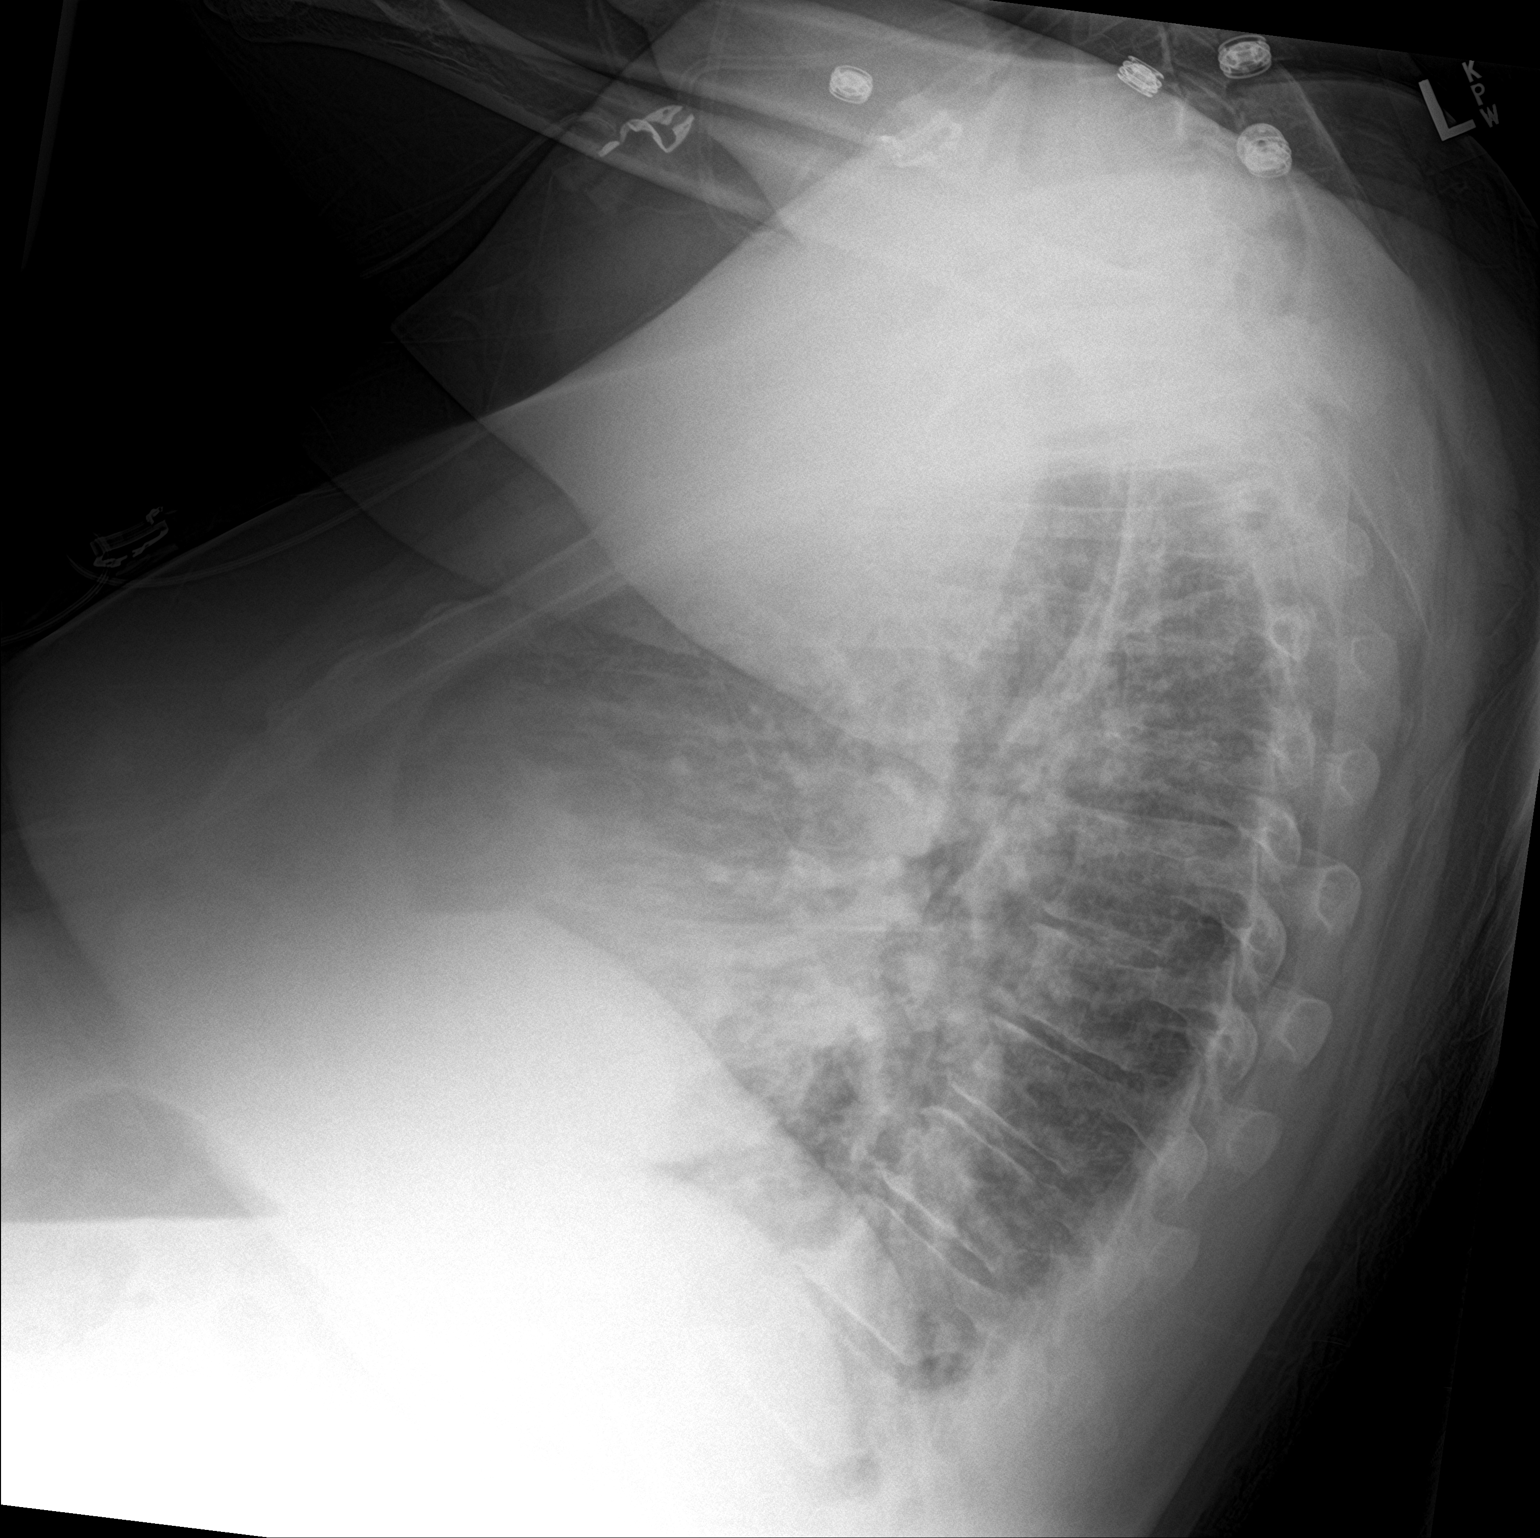

[chest ap]
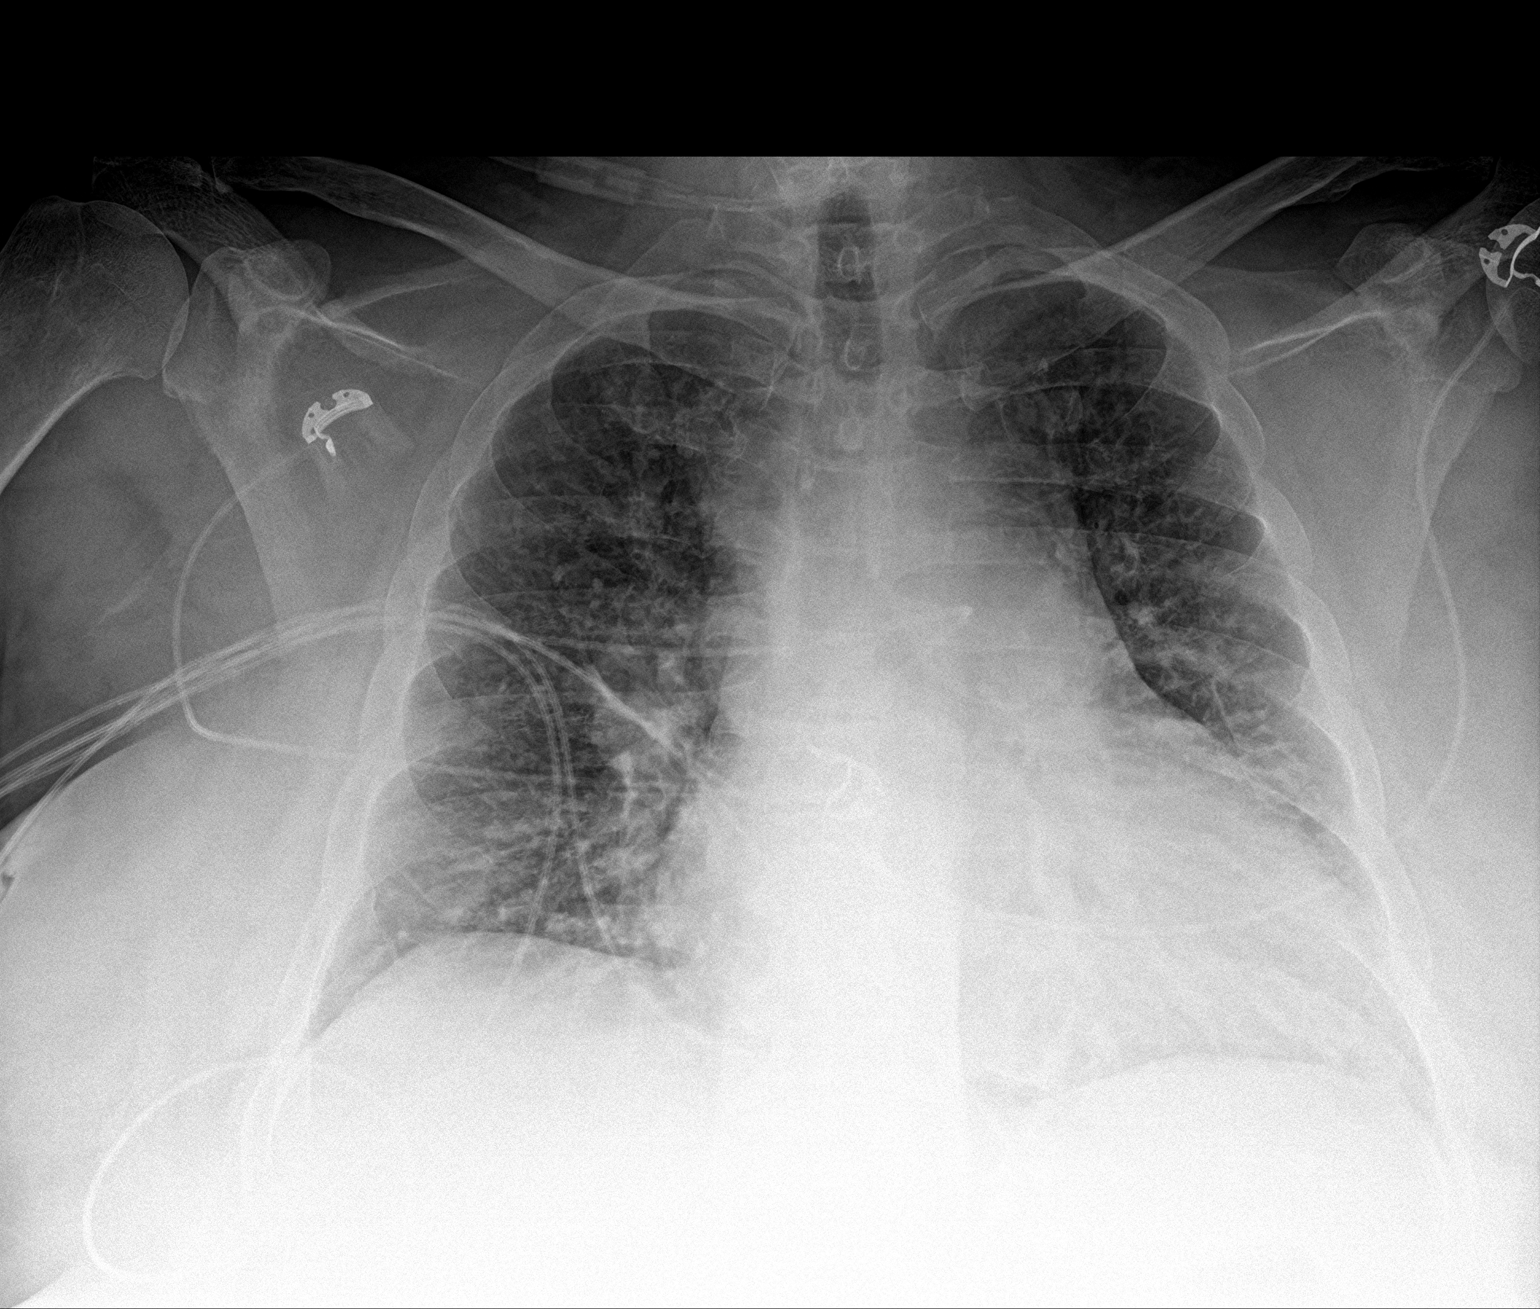

[2 of 2 positions shown; findings below may reference images not displayed]

FINDINGS: Stable cardiomegaly. No pneumothorax or pleural effusion is noted.
Both lungs are clear. The visualized skeletal structures are
unremarkable.
IMPRESSION: No active cardiopulmonary disease.

## 2020-05-30 ENCOUNTER — Ambulatory Visit: Payer: Self-pay | Admitting: Cardiology

## 2020-05-31 ENCOUNTER — Encounter: Payer: Self-pay | Admitting: Cardiology

## 2020-06-27 ENCOUNTER — Other Ambulatory Visit: Payer: Self-pay | Admitting: Gerontology

## 2020-06-28 ENCOUNTER — Telehealth: Payer: Self-pay | Admitting: Gerontology

## 2020-06-28 ENCOUNTER — Other Ambulatory Visit: Payer: Self-pay | Admitting: Gerontology

## 2020-06-30 NOTE — Telephone Encounter (Signed)
Called pt @1 :32 on 10/28. A male answered and said that I had the wrong number. 11/28

## 2020-08-15 ENCOUNTER — Other Ambulatory Visit (INDEPENDENT_AMBULATORY_CARE_PROVIDER_SITE_OTHER): Payer: Self-pay | Admitting: Nurse Practitioner

## 2020-08-15 DIAGNOSIS — I82411 Acute embolism and thrombosis of right femoral vein: Secondary | ICD-10-CM

## 2020-08-16 ENCOUNTER — Encounter (INDEPENDENT_AMBULATORY_CARE_PROVIDER_SITE_OTHER): Payer: Self-pay

## 2020-08-16 ENCOUNTER — Ambulatory Visit (INDEPENDENT_AMBULATORY_CARE_PROVIDER_SITE_OTHER): Payer: Self-pay | Admitting: Vascular Surgery

## 2020-09-19 ENCOUNTER — Other Ambulatory Visit: Payer: Self-pay

## 2020-09-19 ENCOUNTER — Emergency Department: Payer: Self-pay

## 2020-09-19 DIAGNOSIS — I872 Venous insufficiency (chronic) (peripheral): Secondary | ICD-10-CM | POA: Diagnosis present

## 2020-09-19 DIAGNOSIS — J9621 Acute and chronic respiratory failure with hypoxia: Secondary | ICD-10-CM | POA: Diagnosis present

## 2020-09-19 DIAGNOSIS — I5022 Chronic systolic (congestive) heart failure: Secondary | ICD-10-CM | POA: Diagnosis present

## 2020-09-19 DIAGNOSIS — G4733 Obstructive sleep apnea (adult) (pediatric): Secondary | ICD-10-CM | POA: Diagnosis present

## 2020-09-19 DIAGNOSIS — Z9119 Patient's noncompliance with other medical treatment and regimen: Secondary | ICD-10-CM

## 2020-09-19 DIAGNOSIS — L97819 Non-pressure chronic ulcer of other part of right lower leg with unspecified severity: Secondary | ICD-10-CM | POA: Diagnosis present

## 2020-09-19 DIAGNOSIS — I2782 Chronic pulmonary embolism: Secondary | ICD-10-CM | POA: Diagnosis present

## 2020-09-19 DIAGNOSIS — E669 Obesity, unspecified: Secondary | ICD-10-CM | POA: Diagnosis present

## 2020-09-19 DIAGNOSIS — I480 Paroxysmal atrial fibrillation: Secondary | ICD-10-CM | POA: Diagnosis present

## 2020-09-19 DIAGNOSIS — Z79899 Other long term (current) drug therapy: Secondary | ICD-10-CM

## 2020-09-19 DIAGNOSIS — Z95828 Presence of other vascular implants and grafts: Secondary | ICD-10-CM

## 2020-09-19 DIAGNOSIS — Z9981 Dependence on supplemental oxygen: Secondary | ICD-10-CM

## 2020-09-19 DIAGNOSIS — Z20822 Contact with and (suspected) exposure to covid-19: Secondary | ICD-10-CM | POA: Diagnosis present

## 2020-09-19 DIAGNOSIS — Z86718 Personal history of other venous thrombosis and embolism: Secondary | ICD-10-CM

## 2020-09-19 DIAGNOSIS — T45516A Underdosing of anticoagulants, initial encounter: Secondary | ICD-10-CM | POA: Diagnosis present

## 2020-09-19 DIAGNOSIS — F17210 Nicotine dependence, cigarettes, uncomplicated: Secondary | ICD-10-CM | POA: Diagnosis present

## 2020-09-19 DIAGNOSIS — I82412 Acute embolism and thrombosis of left femoral vein: Principal | ICD-10-CM | POA: Diagnosis present

## 2020-09-19 DIAGNOSIS — J441 Chronic obstructive pulmonary disease with (acute) exacerbation: Secondary | ICD-10-CM | POA: Diagnosis present

## 2020-09-19 DIAGNOSIS — Z6841 Body Mass Index (BMI) 40.0 and over, adult: Secondary | ICD-10-CM

## 2020-09-19 DIAGNOSIS — K7460 Unspecified cirrhosis of liver: Secondary | ICD-10-CM | POA: Diagnosis present

## 2020-09-19 NOTE — ED Triage Notes (Signed)
Pt states "I have a blood clot in the back of my leg." Pt states history of same in both legs. Swelling noted to left leg, pt endorses pain. Pt states pain and swelling started 5 days ago, pt states he noted a "knot" on the back of his left leg. Pt states was on Xarelto but stopped taking it a month ago.

## 2020-09-19 NOTE — ED Triage Notes (Signed)
EMS brings pt in from home for c/o left posterior knee pain x 3 days

## 2020-09-19 NOTE — ED Notes (Signed)
Blue, lavender, and light green lab tube sent to lab for specimen hold.

## 2020-09-20 ENCOUNTER — Inpatient Hospital Stay
Admission: EM | Admit: 2020-09-20 | Discharge: 2020-09-21 | DRG: 299 | Disposition: A | Discharge status: Home / Self Care | Payer: Self-pay | Attending: Family Medicine | Admitting: Family Medicine

## 2020-09-20 ENCOUNTER — Emergency Department: Payer: Self-pay

## 2020-09-20 ENCOUNTER — Other Ambulatory Visit: Payer: Self-pay

## 2020-09-20 DIAGNOSIS — I82419 Acute embolism and thrombosis of unspecified femoral vein: Secondary | ICD-10-CM

## 2020-09-20 DIAGNOSIS — J9601 Acute respiratory failure with hypoxia: Secondary | ICD-10-CM

## 2020-09-20 DIAGNOSIS — R778 Other specified abnormalities of plasma proteins: Secondary | ICD-10-CM

## 2020-09-20 DIAGNOSIS — J9621 Acute and chronic respiratory failure with hypoxia: Secondary | ICD-10-CM | POA: Diagnosis present

## 2020-09-20 DIAGNOSIS — I82403 Acute embolism and thrombosis of unspecified deep veins of lower extremity, bilateral: Secondary | ICD-10-CM

## 2020-09-20 DIAGNOSIS — S81801A Unspecified open wound, right lower leg, initial encounter: Secondary | ICD-10-CM | POA: Diagnosis present

## 2020-09-20 DIAGNOSIS — I2699 Other pulmonary embolism without acute cor pulmonale: Secondary | ICD-10-CM | POA: Diagnosis present

## 2020-09-20 DIAGNOSIS — R600 Localized edema: Secondary | ICD-10-CM

## 2020-09-20 DIAGNOSIS — R7989 Other specified abnormal findings of blood chemistry: Secondary | ICD-10-CM | POA: Diagnosis present

## 2020-09-20 DIAGNOSIS — I825Y2 Chronic embolism and thrombosis of unspecified deep veins of left proximal lower extremity: Secondary | ICD-10-CM

## 2020-09-20 DIAGNOSIS — I2782 Chronic pulmonary embolism: Secondary | ICD-10-CM

## 2020-09-20 DIAGNOSIS — Z720 Tobacco use: Secondary | ICD-10-CM | POA: Diagnosis present

## 2020-09-20 DIAGNOSIS — I5022 Chronic systolic (congestive) heart failure: Secondary | ICD-10-CM | POA: Diagnosis present

## 2020-09-20 DIAGNOSIS — I480 Paroxysmal atrial fibrillation: Secondary | ICD-10-CM | POA: Diagnosis present

## 2020-09-20 DIAGNOSIS — J441 Chronic obstructive pulmonary disease with (acute) exacerbation: Secondary | ICD-10-CM | POA: Diagnosis present

## 2020-09-20 DIAGNOSIS — I4891 Unspecified atrial fibrillation: Secondary | ICD-10-CM

## 2020-09-20 DIAGNOSIS — I82409 Acute embolism and thrombosis of unspecified deep veins of unspecified lower extremity: Secondary | ICD-10-CM | POA: Diagnosis present

## 2020-09-20 LAB — CBC WITH DIFFERENTIAL/PLATELET
Abs Immature Granulocytes: 0.03 10*3/uL (ref 0.00–0.07)
Basophils Absolute: 0.1 10*3/uL (ref 0.0–0.1)
Basophils Relative: 1 %
Eosinophils Absolute: 0.2 10*3/uL (ref 0.0–0.5)
Eosinophils Relative: 2 %
HCT: 58.5 % — ABNORMAL HIGH (ref 39.0–52.0)
Hemoglobin: 19.3 g/dL — ABNORMAL HIGH (ref 13.0–17.0)
Immature Granulocytes: 0 %
Lymphocytes Relative: 27 %
Lymphs Abs: 2.6 10*3/uL (ref 0.7–4.0)
MCH: 33.5 pg (ref 26.0–34.0)
MCHC: 33 g/dL (ref 30.0–36.0)
MCV: 101.6 fL — ABNORMAL HIGH (ref 80.0–100.0)
Monocytes Absolute: 0.9 10*3/uL (ref 0.1–1.0)
Monocytes Relative: 9 %
Neutro Abs: 6.1 10*3/uL (ref 1.7–7.7)
Neutrophils Relative %: 61 %
Platelets: 185 10*3/uL (ref 150–400)
RBC: 5.76 MIL/uL (ref 4.22–5.81)
RDW: 14.9 % (ref 11.5–15.5)
WBC: 9.8 10*3/uL (ref 4.0–10.5)
nRBC: 0 % (ref 0.0–0.2)

## 2020-09-20 LAB — BASIC METABOLIC PANEL
Anion gap: 11 (ref 5–15)
BUN: 13 mg/dL (ref 6–20)
CO2: 27 mmol/L (ref 22–32)
Calcium: 9.2 mg/dL (ref 8.9–10.3)
Chloride: 99 mmol/L (ref 98–111)
Creatinine, Ser: 0.7 mg/dL (ref 0.61–1.24)
GFR, Estimated: >60 mL/min (ref >60)
Glucose, Bld: 91 mg/dL (ref 70–99)
Potassium: 4.2 mmol/L (ref 3.5–5.1)
Sodium: 137 mmol/L (ref 135–145)

## 2020-09-20 LAB — AMMONIA: Ammonia: 21 umol/L (ref 9–35)

## 2020-09-20 LAB — HEPARIN LEVEL (UNFRACTIONATED)
Heparin Unfractionated: 0.2 IU/mL — ABNORMAL LOW (ref 0.30–0.70)
Heparin Unfractionated: 0.27 IU/mL — ABNORMAL LOW (ref 0.30–0.70)

## 2020-09-20 LAB — RESP PANEL BY RT-PCR (FLU A&B, COVID) ARPGX2
Influenza A by PCR: NEGATIVE
Influenza B by PCR: NEGATIVE
SARS Coronavirus 2 by RT PCR: NEGATIVE

## 2020-09-20 LAB — CBG MONITORING, ED: Glucose-Capillary: 150 mg/dL — ABNORMAL HIGH (ref 70–99)

## 2020-09-20 LAB — APTT
aPTT: 29 seconds (ref 24–36)
aPTT: 47 seconds — ABNORMAL HIGH (ref 24–36)

## 2020-09-20 LAB — BRAIN NATRIURETIC PEPTIDE: B Natriuretic Peptide: 45.4 pg/mL (ref 0.0–100.0)

## 2020-09-20 LAB — ANTITHROMBIN III: AntiThromb III Func: 79 % (ref 75–120)

## 2020-09-20 LAB — TROPONIN I (HIGH SENSITIVITY)
Troponin I (High Sensitivity): 91 ng/L — ABNORMAL HIGH (ref <18)
Troponin I (High Sensitivity): 62 ng/L — ABNORMAL HIGH (ref <18)
Troponin I (High Sensitivity): 57 ng/L — ABNORMAL HIGH (ref <18)
Troponin I (High Sensitivity): 39 ng/L — ABNORMAL HIGH (ref <18)

## 2020-09-20 LAB — PROTIME-INR
INR: 1.1 (ref 0.8–1.2)
Prothrombin Time: 14 seconds (ref 11.4–15.2)

## 2020-09-20 MED ORDER — HYDROXYZINE HCL 10 MG PO TABS
10.0000 mg | ORAL_TABLET | Freq: Three times a day (TID) | ORAL | Status: DC | PRN
  Filled 2020-09-20: qty 1

## 2020-09-20 MED ORDER — OXYCODONE-ACETAMINOPHEN 5-325 MG PO TABS
1.0000 | ORAL_TABLET | ORAL | Status: DC | PRN
Start: 2020-09-20 — End: 2020-09-21
  Administered 2020-09-20 – 2020-09-21 (×3): 1 via ORAL
  Filled 2020-09-20 (×3): qty 1

## 2020-09-20 MED ORDER — SODIUM CHLORIDE 0.9 % IV SOLN: INTRAVENOUS | Status: DC

## 2020-09-20 MED ORDER — IPRATROPIUM-ALBUTEROL 0.5-2.5 (3) MG/3ML IN SOLN
RESPIRATORY_TRACT | Status: AC
  Administered 2020-09-20: 3 mL
  Filled 2020-09-20: qty 9

## 2020-09-20 MED ORDER — ONDANSETRON 4 MG PO TBDP
4.0000 mg | ORAL_TABLET | Freq: Once | ORAL | Status: AC
  Administered 2020-09-20: 4 mg via ORAL
  Filled 2020-09-20: qty 1

## 2020-09-20 MED ORDER — FENTANYL CITRATE (PF) 100 MCG/2ML IJ SOLN
50.0000 ug | Freq: Once | INTRAMUSCULAR | Status: AC
  Administered 2020-09-20: 50 ug via INTRAVENOUS
  Filled 2020-09-20: qty 2

## 2020-09-20 MED ORDER — SODIUM CHLORIDE 0.9 % IV BOLUS
1000.0000 mL | Freq: Once | INTRAVENOUS | Status: AC
  Administered 2020-09-20: 1000 mL via INTRAVENOUS

## 2020-09-20 MED ORDER — IPRATROPIUM-ALBUTEROL 0.5-2.5 (3) MG/3ML IN SOLN
3.0000 mL | RESPIRATORY_TRACT | Status: DC
  Administered 2020-09-20 – 2020-09-21 (×6): 3 mL via RESPIRATORY_TRACT
  Filled 2020-09-20 (×7): qty 3

## 2020-09-20 MED ORDER — SILVER SULFADIAZINE 1 % EX CREA
TOPICAL_CREAM | Freq: Two times a day (BID) | CUTANEOUS | Status: DC
  Filled 2020-09-20: qty 85

## 2020-09-20 MED ORDER — IOHEXOL 350 MG/ML SOLN
100.0000 mL | Freq: Once | INTRAVENOUS | Status: AC | PRN
  Administered 2020-09-20: 100 mL via INTRAVENOUS

## 2020-09-20 MED ORDER — METHYLPREDNISOLONE SODIUM SUCC 125 MG IJ SOLR
125.0000 mg | Freq: Once | INTRAMUSCULAR | Status: AC
  Administered 2020-09-20: 125 mg via INTRAVENOUS
  Filled 2020-09-20: qty 2

## 2020-09-20 MED ORDER — SENNOSIDES-DOCUSATE SODIUM 8.6-50 MG PO TABS: 1.0000 | ORAL_TABLET | Freq: Every evening | ORAL | Status: DC | PRN

## 2020-09-20 MED ORDER — ALBUTEROL SULFATE (2.5 MG/3ML) 0.083% IN NEBU: 2.5000 mg | INHALATION_SOLUTION | RESPIRATORY_TRACT | Status: DC | PRN

## 2020-09-20 MED ORDER — SODIUM CHLORIDE 0.9 % IV BOLUS: 1000.0000 mL | Freq: Once | INTRAVENOUS | Status: DC

## 2020-09-20 MED ORDER — HEPARIN (PORCINE) 25000 UT/250ML-% IV SOLN
1750.0000 [IU]/h | INTRAVENOUS | Status: DC
  Administered 2020-09-20: 1750 [IU]/h via INTRAVENOUS

## 2020-09-20 MED ORDER — IPRATROPIUM-ALBUTEROL 0.5-2.5 (3) MG/3ML IN SOLN
RESPIRATORY_TRACT | Status: AC
  Administered 2020-09-20: 3 mL
  Filled 2020-09-20: qty 6

## 2020-09-20 MED ORDER — NICOTINE 21 MG/24HR TD PT24
21.0000 mg | MEDICATED_PATCH | Freq: Every day | TRANSDERMAL | Status: DC
  Administered 2020-09-20: 21 mg via TRANSDERMAL
  Filled 2020-09-20 (×2): qty 1

## 2020-09-20 MED ORDER — HEPARIN BOLUS VIA INFUSION
1500.0000 [IU] | Freq: Once | INTRAVENOUS | Status: AC
  Administered 2020-09-20: 1500 [IU] via INTRAVENOUS
  Filled 2020-09-20: qty 1500

## 2020-09-20 MED ORDER — ACETAMINOPHEN 325 MG PO TABS: 650.0000 mg | ORAL_TABLET | Freq: Four times a day (QID) | ORAL | Status: DC | PRN

## 2020-09-20 MED ORDER — METHYLPREDNISOLONE SODIUM SUCC 40 MG IJ SOLR
40.0000 mg | Freq: Two times a day (BID) | INTRAMUSCULAR | Status: DC
  Administered 2020-09-20 – 2020-09-21 (×2): 40 mg via INTRAVENOUS
  Filled 2020-09-20 (×2): qty 1

## 2020-09-20 MED ORDER — HEPARIN BOLUS VIA INFUSION
4000.0000 [IU] | Freq: Once | INTRAVENOUS | Status: AC
  Administered 2020-09-20: 4000 [IU] via INTRAVENOUS
  Filled 2020-09-20: qty 4000

## 2020-09-20 MED ORDER — HYDROXYZINE HCL 50 MG/ML IM SOLN
25.0000 mg | Freq: Four times a day (QID) | INTRAMUSCULAR | Status: DC | PRN
  Administered 2020-09-20: 25 mg via INTRAMUSCULAR
  Filled 2020-09-20 (×2): qty 0.5

## 2020-09-20 MED ORDER — OXYCODONE-ACETAMINOPHEN 5-325 MG PO TABS
1.0000 | ORAL_TABLET | Freq: Once | ORAL | Status: AC
  Administered 2020-09-20: 1 via ORAL
  Filled 2020-09-20: qty 1

## 2020-09-20 MED ORDER — HEPARIN BOLUS VIA INFUSION
2000.0000 [IU] | Freq: Once | INTRAVENOUS | Status: AC
  Administered 2020-09-20: 2000 [IU] via INTRAVENOUS
  Filled 2020-09-20: qty 2000

## 2020-09-20 MED ORDER — DM-GUAIFENESIN ER 30-600 MG PO TB12: 1.0000 | ORAL_TABLET | Freq: Two times a day (BID) | ORAL | Status: DC | PRN

## 2020-09-20 MED ORDER — HEPARIN (PORCINE) 25000 UT/250ML-% IV SOLN
1550.0000 [IU]/h | INTRAVENOUS | Status: DC
  Administered 2020-09-20: 1550 [IU]/h via INTRAVENOUS
  Administered 2020-09-20: 1350 [IU]/h via INTRAVENOUS
  Filled 2020-09-20 (×2): qty 250

## 2020-09-20 MED ORDER — ONDANSETRON HCL 4 MG/2ML IJ SOLN
4.0000 mg | Freq: Once | INTRAMUSCULAR | Status: AC
  Administered 2020-09-20: 4 mg via INTRAVENOUS
  Filled 2020-09-20: qty 2

## 2020-09-20 MED ORDER — MORPHINE SULFATE (PF) 2 MG/ML IV SOLN
2.0000 mg | INTRAVENOUS | Status: DC | PRN
  Administered 2020-09-20 – 2020-09-21 (×2): 2 mg via INTRAVENOUS
  Filled 2020-09-20 (×2): qty 1

## 2020-09-20 NOTE — Consult Note (Signed)
[facility] Vascular Consult Note  MRN : [id number]  Matthew [name] is a 50 y.o. (09/16/1970) male who presents with chief complaint of  Chief Complaint  Patient presents with  . 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  Patient is well-known to our service as we have treated him multiple times in the past for DVT PE and subsequent thrombectomy/thrombolysis.  The patient does have an IVC filter which was placed in June 2021.  Unfortunately, the patient does have a history of noncompliance with medications resulting in the propagation of his DVT and PE in the past.  Patient states that he stopped taking his Xarelto on his own about a month or 2 ago.  Patient endorses a history of 5 days ago started having pain and swelling of the left lower extremity and fell a knot on the back of his leg.  Complain of 8 out of 10 sharp and throbbing constant pain that is worse with ambulation.  He denies chest pain or shortness of breath.  Denies cough.  Patient was found to be hypoxic in the emergency department and started on supplemental oxygen.  Does have a history of COPD exacerbations that we have treated in the past.  CTA chest was notable for an essentially stable pulmonary embolus. Left lower extremity venous duplex was notable for nonocclusive DVT.  Vascular surgery was consulted by Dr. Clyde LundborgNiu for possible endovascular intervention.  Current Facility-Administered Medications  Medication Dose Route Frequency Provider Last Rate Last Admin  . acetaminophen (TYLENOL) tablet 650 mg  650 mg Oral Q6H PRN Lorretta HarpNiu, Xilin, MD      . albuterol (PROVENTIL) (2.5 MG/3ML) 0.083%  nebulizer solution 2.5 mg  2.5 mg Nebulization Q4H PRN Lorretta HarpNiu, Xilin, MD      . dextromethorphan-guaiFENesin [facility](MUCINEX DM) 30-600 MG per 12 hr tablet 1 tablet  1 tablet Oral BID PRN Lorretta HarpNiu, Xilin, MD      . heparin ADULT infusion 100 units/mL (25000 units/25650mL)  1,350 Units/hr Intravenous Continuous Lorretta HarpNiu, Xilin, MD 13.5 mL/hr at 09/20/20 0522 1,350 Units/hr at 09/20/20 0522  . hydrOXYzine (ATARAX/VISTARIL) tablet 10 mg  10 mg Oral TID PRN Lorretta HarpNiu, Xilin, MD      . hydrOXYzine (VISTARIL) injection 25 mg  25 mg Intramuscular Q6H PRN Lorretta HarpNiu, Xilin, MD   25 mg at 09/20/20 1129  . ipratropium-albuterol (DUONEB) 0.5-2.5 (3) MG/3ML nebulizer solution 3 mL  3 mL Nebulization Q4H Lorretta HarpNiu, Xilin, MD   3 mL at 09/20/20 1214  . morphine 2 MG/ML injection 2 mg  2 mg Intravenous Q4H PRN Lorretta HarpNiu, Xilin, MD      . nicotine (NICODERM CQ - dosed in mg/24 hours) patch 21 mg  21 mg Transdermal Daily Lorretta HarpNiu, Xilin, MD   21 mg at 09/20/20 1127  . oxyCODONE-acetaminophen (PERCOCET/ROXICET) 5-325 MG per tablet 1 tablet  1 tablet Oral Q4H PRN Lorretta HarpNiu, Xilin, MD   1 tablet at 09/20/20 1126  . senna-docusate (Senokot-S) tablet 1 tablet  1 tablet Oral QHS PRN Lorretta HarpNiu, Xilin, MD       Current Outpatient Medications  Medication Sig Dispense Refill  . albuterol (VENTOLIN HFA) 108 (90 Base) MCG/ACT inhaler Inhale 2 puffs into the lungs every 6 (six)  hours as needed for wheezing or shortness of breath. (Patient not taking: Reported on 03/30/2020) 18 g 0  . diltiazem (TIAZAC) 120 MG 24 hr capsule TAKE ONE CAPSULE BY MOUTH EVERY DAY 30 capsule 0  . nicotine (NICODERM CQ - DOSED IN MG/24 HOURS) 14 mg/24hr patch One 14mg  patch chest wall daily (okay to substitute generic) (Patient not taking: Reported on 03/30/2020) 28 patch 0  . rivaroxaban (XARELTO) 20 MG TABS tablet Take 1 tablet (20 mg total) by mouth daily with supper. 30 tablet 5  . spironolactone (ALDACTONE) 25 MG tablet Take 0.5 tablets (12.5 mg total) by mouth daily for 7 days. 4 tablet 0   Past Medical  History:  Diagnosis Date  . Arthritis   . Hx of blood clots   . Sleep apnea    patient states not anymore   Past Surgical History:  Procedure Laterality Date  . IVC FILTER INSERTION Left 11/24/2018   Procedure: IVC FILTER INSERTION;  Surgeon: 11/26/2018, MD;  Location: [facility] INVASIVE CV [facility];  Service: Cardiovascular;  Laterality: Left;  . IVC FILTER INSERTION Right 02/18/2019   Procedure: IVC FILTER INSERTION WITH RIGHT LOWER EXTREMITY VENOUS LYSIS;  Surgeon: 02/20/2019, MD;  Location: [facility] INVASIVE CV LAB;  Service: Cardiovascular;  Laterality: Right;  . IVC FILTER REMOVAL N/A 12/01/2018   Procedure: IVC FILTER REMOVAL;  Surgeon: 12/03/2018, MD;  Location: [facility] INVASIVE CV [facility];  Service: Cardiovascular;  Laterality: N/A;  . NO PAST SURGERIES    . PERIPHERAL VASCULAR THROMBECTOMY Right 10/30/2019   Procedure: PERIPHERAL VASCULAR THROMBECTOMY / THROMBOLYSIS;  Surgeon: 11/01/2019, MD;  Location: [facility] INVASIVE CV LAB;  Service: Cardiovascular;  Laterality: Right;  . PERIPHERAL VASCULAR THROMBECTOMY Right 11/05/2019   Procedure: PERIPHERAL VASCULAR THROMBECTOMY / THROMBOLYSIS;  Surgeon: 01/05/2020, MD;  Location: [facility] INVASIVE CV LAB;  Service: Cardiovascular;  Laterality: Right;   Social History Social History   Tobacco Use  . Smoking status: Current Every Day Smoker    Packs/day: 0.50    Types: Cigarettes  . Smokeless tobacco: Current User  Vaping Use  . Vaping Use: Former  Substance Use Topics  . Alcohol use: Yes    Comment: weekends says every third weekend a 12 pack  . Drug use: Never   Family History History reviewed. No pertinent family history.  Denies family history of peripheral artery disease, venous disease or bleeding/clotting disorders.  Allergies  Allergen Reactions  . Amoxicillin Rash   REVIEW OF SYSTEMS (Negative unless checked)  Constitutional: [] Weight loss  [] Fever  [] Chills Cardiac: [] Chest pain   [] Chest pressure   [] Palpitations   [] Shortness  of breath when laying flat   [] Shortness of breath at rest   [] Shortness of breath with exertion. Vascular:  [x] Pain in legs with walking   [] Pain in legs at rest   [] Pain in legs when laying flat   [] Claudication   [] Pain in feet when walking  [] Pain in feet at rest  [] Pain in feet when laying flat   [] History of DVT   [] Phlebitis   [x] Swelling in legs   [] Varicose veins   [] Non-healing ulcers Pulmonary:   [] Uses home oxygen   [] Productive cough   [] Hemoptysis   [] Wheeze  [x] COPD   [] Asthma Neurologic:  [] Dizziness  [] Blackouts   [] Seizures   [] History of stroke   [] History of TIA  [] Aphasia   [] Temporary blindness   [] Dysphagia   [] Weakness or numbness in arms   [] Weakness or numbness  in legs Musculoskeletal:  [] Arthritis   [] Joint swelling   [] Joint pain   [] Low back pain Hematologic:  [] Easy bruising  [] Easy bleeding   [] Hypercoagulable state   [] Anemic  [] Hepatitis Gastrointestinal:  [] Blood in stool   [] Vomiting blood  [] Gastroesophageal reflux/heartburn   [] Difficulty swallowing. Genitourinary:  [] Chronic kidney disease   [] Difficult urination  [] Frequent urination  [] Burning with urination   [] Blood in urine Skin:  [] Rashes   [] Ulcers   [] Wounds Psychological:  [] History of anxiety   []  History of major depression.  Physical Examination  Vitals:   09/20/20 1000 09/20/20 1030 09/20/20 1100 09/20/20 1130  BP: 109/76 108/84 104/74 127/89  Pulse: 95 100 100 96  Resp: 14 (!) 30  (!) 22  Temp:      TempSrc:      SpO2: 100% 97% 98% (!) 85%  Weight:      Height:       Body mass index is 45.42 kg/m. Gen:  WD/WN, NAD Head: [place]/AT, No temporalis wasting. Prominent temp pulse not noted. Ear/Nose/Throat: Hearing grossly intact, nares w/o erythema or drainage, oropharynx w/o Erythema/Exudate Eyes: Sclera non-icteric, conjunctiva clear Neck: Trachea midline.  No JVD.  Pulmonary:  Good air movement, respirations not labored, equal bilaterally.  Cardiac: RRR, normal S1, S2. Vascular:  Vessel  Right Left  Radial Palpable Palpable  Ulnar Palpable Palpable  Brachial Palpable Palpable  Carotid Palpable, without bruit Palpable, without bruit  Aorta Not palpable N/A  Femoral Palpable Palpable  Popliteal Palpable Palpable  PT Palpable Palpable  DP Palpable Palpable   Left lower extremity: Thigh soft.  Calf soft.  Extremities distally toes.  Palpable pedal pulses.  Stasis dermatitis changes to the leg is noted.  Moderate edema.  Motor/sensory is intact.  Right lower extremity: Thigh soft.  Calf soft.  Extremities distally toes.  Palpable pedal pulses.  Stasis dermatitis changes to the leg is noted.  Moderate edema.  Motor/sensory is intact.  1 inch x 1 inch shallow ulceration noted to the front of the shin.  Granulation tissue noted.  Gastrointestinal: soft, non-tender/non-distended. No guarding/reflex.  Musculoskeletal: M/S 5/5 throughout.  Extremities without ischemic changes.  No deformity or atrophy. No edema. Neurologic: Sensation grossly intact in extremities.  Symmetrical.  Speech is fluent. Motor exam as listed above. Psychiatric: Judgment intact, Mood & affect appropriate for pt's clinical situation. Dermatologic: As above  Lymph : No Cervical, Axillary, or Inguinal lymphadenopathy.  CBC Lab Results  Component Value Date   WBC 9.8 09/19/2020   HGB 19.3 (H) 09/19/2020   HCT 58.5 (H) 09/19/2020   MCV 101.6 (H) 09/19/2020   PLT 185 09/19/2020   BMET    Component Value Date/Time   NA 137 09/19/2020 2046   NA 144 04/06/2020 1140   K 4.2 09/19/2020 2046   CL 99 09/19/2020 2046   CO2 27 09/19/2020 2046   GLUCOSE 91 09/19/2020 2046   BUN 13 09/19/2020 2046   BUN 12 04/06/2020 1140   CREATININE 0.70 09/19/2020 2046   CALCIUM 9.2 09/19/2020 2046   GFRNONAA >60 09/19/2020 2046   GFRAA 118 04/06/2020 1140   Estimated Creatinine Clearance: 144.2 mL/min (by C-G formula based on SCr of 0.7 mg/dL).  COAG Lab Results  Component Value Date   INR 1.1 09/20/2020   INR  2.0 (H) 12/22/2019   INR 1.1 12/19/2019   Radiology CT Angio Chest PE W and/or Wo Contrast  Result Date: 09/20/2020 CLINICAL DATA:  PE suspected.  Shortness of breath.  Leg  swelling. EXAM: CT ANGIOGRAPHY CHEST WITH CONTRAST TECHNIQUE: Multidetector CT imaging of the chest was performed using the standard protocol during bolus administration of intravenous contrast. Multiplanar CT image reconstructions and MIPs were obtained to evaluate the vascular anatomy. CONTRAST:  100mL OMNIPAQUE IOHEXOL 350 MG/ML SOLN COMPARISON:  12/20/2019 FINDINGS: Cardiovascular: Contrast injection is sufficient to demonstrate satisfactory opacification of the pulmonary arteries to the segmental level. 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 There is chronic thrombus within the right lower lobe pulmonary artery. There is no acute pulmonary embolus. The heart size is enlarged. There is no significant pericardial effusion. There are coronary artery calcifications. There are atherosclerotic changes of the thoracic aorta without evidence for an aneurysm. 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 Mediastinum/Nodes: -- No mediastinal lymphadenopathy. -- No hilar lymphadenopathy. -- No axillary lymphadenopathy. -- No supraclavicular lymphadenopathy. -- Normal thyroid gland where visualized. -  Unremarkable esophagus. Lungs/Pleura: There are scattered areas of pleuroparenchymal scarring bilaterally. There is no pneumothorax. No large pleural effusion. The trachea is unremarkable. Upper Abdomen: Contrast bolus timing is not optimized for evaluation of the abdominal organs. There is probable hepatic steatosis. There is reflux of contrast into the IVC. Musculoskeletal: No chest wall abnormality. No bony spinal canal stenosis. Review of the MIP images confirms the above findings. IMPRESSION: 1. No acute pulmonary embolus. 2. 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 3. Cardiomegaly with reflux of contrast into the IVC, suggestive of right heart failure. 4. 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 5. Scattered areas of pleuroparenchymal scarring. Aortic Atherosclerosis (ICD10-I70.0). Electronically Signed   By: Katherine Mantlehristopher  Green M.D.   On: 09/20/2020 06:40   US Venous Img Lower Unilateral Left  Addendum Date: 09/20/2020   ADDENDUM REPORT: 09/20/2020 00:19 ADDENDUM: These results were called by telephone at the time of interpretation on 09/20/2020 at 12:19 am to provider Chiquita LothJade Sung, who verbally acknowledged these results. Electronically Signed   By: Kreg ShropshirePrice  DeHay M.D.   On: 09/20/2020 00:19   Result Date: 09/20/2020 CLINICAL DATA:  Left posterior knee pain for 3 days, history of bilateral DVT, stop Xarelto 1 month prior. Pain and edema EXAM: LEFT LOWER EXTREMITY VENOUS DOPPLER ULTRASOUND TECHNIQUE: Gray-scale sonography with compression, as well as color and duplex ultrasound, were performed to evaluate the deep venous system(s) from the level of the common femoral vein through the popliteal and proximal calf veins. COMPARISON:  CT angio runoff 11/04/2019, and bilateral Doppler ultrasound 03/11/2019 FINDINGS: VENOUS There is incompletely occlusive thrombus extending throughout the deep venous system of the left lower extremity from the common femoral vein, into the saphenofemoral junction and profundus femoral vein as well as throughout the femoral and popliteal venous segments as visualized. Edematous changes and swelling limited visualization of the posterior tibial and peroneal vein. Additional note is made of the superficial thrombophlebitis present in the left greater saphenous vein, with a similar appearance to prior comparison imaging. Incidental note is made of partially occlusive thrombus within the  contralateral right common femoral vein as well. Possibly related to extensive thrombosis seen on comparison CT angiography 11/04/2019. OTHER Edematous changes and soft tissue swelling of the lower extremity noted by sonographer. Limitations: Extensive edema patient body habitus result in a technically challenging exam. IMPRESSION: 1. Partially occlusive thrombus throughout the visible segments of the  left lower extremity deep venous system. 2. Superficial thrombosis in the left greater saphenous vein as well. 3. Additional note is made of partially occlusive thrombus within the contralateral right common femoral vein as well. Possibly related to extensive thrombosis seen on comparison CT angiography 11/04/2019. 4. Extensive edema and soft tissue swelling of the left lower extremity. Electronically Signed: By: Kreg Shropshire M.D. 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  1.  DVT/PE: Patient is well-known to our service as we have been treating/intervening over the last year or so.  Patient has undergone at least 2 interventions and now has an IVC filter.  Unfortunately, the patient is notoriously noncompliant and tends to stop his anticoagulation even though he has been warned numerous times of the dangers.  Reviewed both CT and DVT studies with Dr. Gilda Crease.  PE is essentially stable.  Left lower extremity DVT is nonocclusive.  This is not considered failure of oral anticoagulation as the patient stopped this medication on his own.  Due to the unchanged status of his PE/DVT and lack of compliance patient is not a candidate for any thrombectomy / thrombolysis at this time.  Agree with initiation of heparin  and would transition the patient back to Xarelto.   2. Lower Extremity Edema With Wound: Again, the patient has been noncompliant in engaging conservative therapies including wearing medical grade 1 compression socks, elevation and remaining active.  The patient would benefit from zinc oxide Unna wraps especially to that right lower extremity where his wound is located however the patient has not been compliant in follow-up to our office.  The patient is a stasis dermatitis changes to the bilateral legs.  Patient has palpable pedal pulses and I do not feel arterial insufficiency is playing a role.  We will consult the WOC for either Unna wraps or local wound care instructions.  3. Tobacco Abuse: We had a discussion for approximately three minutes regarding the absolute need for smoking cessation due to the deleterious nature of tobacco on the vascular system. We discussed the tobacco use would diminish patency of any intervention, and likely significantly worsen progressio nof disease. We discussed multiple agents for quitting including replacement therapy or medications to reduce cravings such as Chantix. The patient voices their understanding of the importance of smoking cessation.  Seen and examined with Dr. Romie Jumper, PA-C  09/20/2020 1:11 PM  This note was created with Dragon medical transcription system.  Any error is purely unintentional

## 2020-09-20 NOTE — Progress Notes (Addendum)
ANTICOAGULATION CONSULT NOTE - Initial Consult  Pharmacy Consult for Heparin Indication: chest pain/ACS  Allergies  Allergen Reactions  . Amoxicillin Rash   Patient Measurements: Height: 5\' 7"  (170.2 cm) Weight: 131.5 kg (290 lb) IBW/kg (Calculated) : 66.1 HEPARIN DW (KG): 97.3  Vital Signs: Temp: 99 F (37.2 C) (01/17 2026) Temp Source: Oral (01/17 2026) BP: 113/73 (01/18 0201) Pulse Rate: 88 (01/18 0201)  Labs: Recent Labs    09/19/20 2046  HGB 19.3*  HCT 58.5*  PLT 185  CREATININE 0.70    Estimated Creatinine Clearance: 144.2 mL/min (by C-G formula based on SCr of 0.7 mg/dL).   Medical History: Past Medical History:  Diagnosis Date  . Arthritis   . Hx of blood clots   . Sleep apnea    patient states not anymore    Medications:  (Not in a hospital admission)   Assessment: Pt on Xarelto 20mg  daily PTA, last dose not recorded however per notes pt verbally stated he stopped taking it 1 month ago, despite having a DVT.  Baseline labs ordered.  Heparin initiated for ACS.  Will check aPTT and Heparin to evaluate for correlation.    Goal of Therapy:  aPTT 66-102 seconds Monitor platelets by anticoagulation protocol: Yes   Plan:  Heparin bolus 4000 units x 1 then infusion at 1350 units/hr Check aPTT and HL ~ 6 hours after heparin started, eval for correlation Monitor CBC  Geramy Lamorte A 09/20/2020,4:51 AM

## 2020-09-20 NOTE — Consult Note (Signed)
WOC Nurse Consult Note: Reason for Consult: Right anterior LE full thickness wound. Photo placed in EMR by Dr. Clyde Lundborg is appreciated. Wound type: venous and arterial insufficiency, venous > arterial Pressure Injury POA: N/A Measurement: 1cm x 1.5cm x 0.5cm Wound MVH:QIONG, 75% slough, 25% red Drainage (amount, consistency, odor) small serosanguinous Periwound: dry Dressing procedure/placement/frequency: I have provided Nursing with guidance for a topical moisture retentive and antimicrobial wound care regimen performed twice daily rather than intermittently (twice weekly) if a compression bandaging system (Unna's Boot) were to be applied at this time. I will provide an environment to autolytically debride the nonviable tissue in the wound bed using an antimicrobial (silver sulfadiazine) moisture retentive dressing with twice daily applications after cleansing. I will follow that with a dry boot, i.e., wrapping with Kerlix roll gauze from just below the toes to just below the knee and topping that with an ACE bandage wrapped in a similar manner. We will float the heel while in house with a Prevalon boot to the right foot while in bed.  Referral to the outpatient wound care center of the patient's choosing is recommended, where he can be followed for wound healing and alternatives to compression if he is unwilling to participate in a POC that includes donning and doffing garments. Some of those may include over-the-heart elevation for 30 minutes twice pr three times daily or another plan.  If you agree, please order.  WOC nursing team will not follow, but will remain available to this patient, the nursing and medical teams.  Please re-consult if needed. Thanks, Ladona Mow, MSN, RN, GNP, [facility] [place]  Pager# [phone]

## 2020-09-20 NOTE — Consult Note (Signed)
WOC Nurse Consult Note: Reason for Consult: Anterior LE wound Wound type:Mixed etiology, Venous>arterial Pressure Injury POA: N/A  WOC Nursing is requested for Unna's Boot wrapping or other suggestion for patient's anterior LE wound by Vascular PA, K. Stegmayer. Ms. Geri Seminole reports patient non adherence to POC with compression garments.  I have communicated a request for a photograph of the wound from Dr. Clyde Lundborg via Secure Chat. He indicates that he will be able to obtain a little later this evening.  I will stand by.  Thanks, Ladona Mow, MSN, RN, GNP, [facility] [place]  Pager# [phone]

## 2020-09-20 NOTE — ED Notes (Signed)
Dietary called and will send up meal tray at this time.

## 2020-09-20 NOTE — ED Provider Notes (Signed)
-----------------------------------------   12:38 AM on 09/20/2020 -----------------------------------------  Positive ultrasound report taken verbally from radiology Dr. Elvera Maria.  This has been relayed to the charge nurse.  Unfortunately the current COVID-19 surge plus ED boarding 38 admitted patients does not allow for patient to have a bed at this time.  Will order basic lab work in the event patient requires CTA chest.   Irean Hong, MD 09/20/20 0040

## 2020-09-20 NOTE — Progress Notes (Signed)
ANTICOAGULATION CONSULT NOTE  Pharmacy Consult for Heparin Indication: chest pain/ACS  Allergies  Allergen Reactions  . Amoxicillin Rash   Patient Measurements: Height: 5\' 7"  (170.2 cm) Weight: 131.5 kg (290 lb) IBW/kg (Calculated) : 66.1 HEPARIN DW (KG): 97.3  Vital Signs: BP: 95/53 (01/18 2230) Pulse Rate: 96 (01/18 2215)  Labs: Recent Labs    09/19/20 2046 09/20/20 0513 09/20/20 0513 09/20/20 0831 09/20/20 1136 09/20/20 1251 09/20/20 1333 09/20/20 2125  HGB 19.3*  --   --   --   --   --   --   --   HCT 58.5*  --   --   --   --   --   --   --   PLT 185  --   --   --   --   --   --   --   APTT  --  29  --   --   --  47*  --   --   LABPROT  --  14.0  --   --   --   --   --   --   INR  --  1.1  --   --   --   --   --   --   HEPARINUNFRC  --   --   --   --   --  0.20*  --  0.27*  CREATININE 0.70  --   --   --   --   --   --   --   TROPONINIHS  --  91*   < > 62* 57*  --  39*  --    < > = values in this interval not displayed.    Estimated Creatinine Clearance: 144.2 mL/min (by C-G formula based on SCr of 0.7 mg/dL).   Medical History: Past Medical History:  Diagnosis Date  . Arthritis   . Hx of blood clots   . Sleep apnea    patient states not anymore    Medications:  (Not in a hospital admission)   Assessment: Pt on Xarelto 20mg  daily PTA, last dose not recorded however per notes pt verbally stated he stopped taking it 1 month ago, despite having a DVT. Heparin initiated for ACS. Baseline HL and aPTT correlate and are consistent with the patient stopping Xarelto PTA.  Heparin dosing weight: 97.3 kg H&H and plts WNL   Date Time HL Rate/comment 1/18 1251 0.2 1350 units/hr 1/18 2125 0.27 1550 units/hr (SUBtherapeutic)  Goal of Therapy:  Heparin level 0.3-0.7 units/ml Monitor platelets by anticoagulation protocol: Yes   Plan:  Heparin bolus 2000 units x 1 then increase infusion to 1750 units/hr Check HL ~ 6 hours after rate increase Monitor daily  CBC, s/s of bleed   2/18 A 09/20/2020,10:58 PM

## 2020-09-20 NOTE — ED Notes (Signed)
Spoke with lab regarding heparin level was advised that they did not received a blue  Top ,

## 2020-09-20 NOTE — H&P (Signed)
History and Physical    Matthew [name] WUJ:[id number] DOB: 1971/01/12 DOA: 09/20/2020  Referring MD/NP/PA:   PCP: Rolm Gala, NP   Patient coming from:  The patient is coming from home.  At baseline, pt is independent for most of ADL.        Chief Complaint: leg pain  HPI: Matthew [name] is a 50 y.o. male with medical history significant of COPD on 3 L oxygen, CHF with EF of 40 to 50%, bilateral DVT and PE on Xarelto (not taking Xarelto for 1 month), OSA, atrial fibrillation, tobacco abuse, liver cirrhosis, who presents with leg pain.  Patient has history of DVT and PE.  He was on Xarelto, but stopped taking Xarelto for 1 month ago. He developed pain and swelling in the left leg which has been going on for more than 5 days and progressively worsening.  The pain is constant, sharp, 8 out of 10 in severity, nonradiating, aggravated by walking. He denies chest pain, shortness of breath, cough, fever or chills.  No nausea, vomiting, diarrhea or abdominal pain.  No symptoms of UTI.  Per ED physician, initially patient did not have hypoxia, but had 1 episode of hypoxia with oxygen desaturating to 40s, was put on 5 L oxygen, then improved to 95% on room air.   ED Course: pt was found to have WBC 9.8, INR 1.1, PTT 29, troponin level 91, BNP 45, negative COVID PCR, electrolytes renal function okay, temperature 99, blood pressure 97/60, heart rate 112, 96, 25. Lower extremity Doppler showed bilateral extensive DVT.  CT angiogram is negative for acute PE, but showed a chronic PE on right middle lobe and the right lower lobe.  Patient is admitted to progressive bed as inpatient   CTA of chest: 1. No acute pulmonary embolus. 2. Again noted are chronic pulmonary emboli involving the right middle lobe and right lower lobe. 3. Cardiomegaly with reflux of contrast into the IVC, suggestive of right heart failure. 4. Severe dilatation of the main pulmonary artery, which can be seen in patients  with elevated pulmonary artery pressures. 5. Scattered areas of pleuroparenchymal scarring. 6. Aortic Atherosclerosis (ICD10-I70.0).   Review of Systems:   General: no fevers, chills, no body weight gain,  has fatigue HEENT: no blurry vision, hearing changes or sore throat Respiratory: no dyspnea, no coughing, wheezing CV: no chest pain, no palpitations GI: no nausea, vomiting, abdominal pain, diarrhea, constipation GU: no dysuria, burning on urination, increased urinary frequency, hematuria  Ext: has bilateral leg edema and pain Neuro: no unilateral weakness, numbness, or tingling, no vision change or hearing loss Skin: Has a small wound in the right anterior lower leg MSK: No muscle spasm, no deformity, no limitation of range of movement in spin Heme: No easy bruising.  Travel history: No recent long distant travel.  Allergy:  Allergies  Allergen Reactions  . Amoxicillin Rash    Past Medical History:  Diagnosis Date  . Arthritis   . Hx of blood clots   . Sleep apnea    patient states not anymore    Past Surgical History:  Procedure Laterality Date  . IVC FILTER INSERTION Left 11/24/2018   Procedure: IVC FILTER INSERTION;  Surgeon: Annice Needy, MD;  Location: [facility];  Service: Cardiovascular;  Laterality: Left;  . IVC FILTER INSERTION Right 02/18/2019   Procedure: IVC FILTER INSERTION WITH RIGHT LOWER EXTREMITY VENOUS LYSIS;  Surgeon: Renford Dills, MD;  Location: [facility];  Service:  Cardiovascular;  Laterality: Right;  . IVC FILTER REMOVAL N/A 12/01/2018   Procedure: IVC FILTER REMOVAL;  Surgeon: Annice Needy, MD;  Location: [facility];  Service: Cardiovascular;  Laterality: N/A;  . NO PAST SURGERIES    . PERIPHERAL VASCULAR THROMBECTOMY Right 10/30/2019   Procedure: PERIPHERAL VASCULAR THROMBECTOMY / THROMBOLYSIS;  Surgeon: Annice Needy, MD;  Location: [facility];  Service: Cardiovascular;  Laterality: Right;  .  PERIPHERAL VASCULAR THROMBECTOMY Right 11/05/2019   Procedure: PERIPHERAL VASCULAR THROMBECTOMY / THROMBOLYSIS;  Surgeon: Annice Needy, MD;  Location: [facility];  Service: Cardiovascular;  Laterality: Right;    Social History:  reports that he has been smoking cigarettes. He has been smoking about 0.50 packs per day. He uses smokeless tobacco. He reports current alcohol use. He reports that he does not use drugs.  Family History: Reviewed with patient, patient states that both parents passed away.  His brother is healthy, no significant medical history.  Prior to Admission medications   Medication Sig Start Date End Date Taking? Authorizing Provider  albuterol (VENTOLIN HFA) 108 (90 Base) MCG/ACT inhaler Inhale 2 puffs into the lungs every 6 (six) hours as needed for wheezing or shortness of breath. Patient not taking: Reported on 03/30/2020 11/06/19   Alford Highland, MD  diltiazem (TIAZAC) 120 MG 24 hr capsule TAKE ONE CAPSULE BY MOUTH EVERY DAY 06/28/20   Iloabachie, Chioma E, NP  nicotine (NICODERM CQ - DOSED IN MG/24 HOURS) 14 mg/24hr patch One 14mg  patch chest wall daily (okay to substitute generic) Patient not taking: Reported on 03/30/2020 11/06/19   01/06/20, MD  rivaroxaban (XARELTO) 20 MG TABS tablet Take 1 tablet (20 mg total) by mouth daily with supper. 01/07/20   Iloabachie, Chioma E, NP  spironolactone (ALDACTONE) 25 MG tablet Take 0.5 tablets (12.5 mg total) by mouth daily for 7 days. 03/30/20 04/06/20  06/06/20 E, NP    Physical Exam: Vitals:   09/20/20 1100 09/20/20 1130 09/20/20 1505 09/20/20 1700  BP: 104/74 127/89 (!) 81/53 100/64  Pulse: 100 96 91 93  Resp:  (!) 22 17 (!) 23  Temp:      TempSrc:      SpO2: 98% (!) 85% 93% 94%  Weight:      Height:       General: Not in acute distress HEENT:       Eyes: PERRL, EOMI, no scleral icterus.       ENT: No discharge from the ears and nose, no pharynx injection, no tonsillar enlargement.        Neck:  No JVD, no bruit, no mass felt. Heme: No neck lymph node enlargement. Cardiac: S1/S2, RRR, No murmurs, No gallops or rubs. Respiratory: Has mild wheezing bilaterally GI: Soft, nondistended, nontender, no rebound pain, no organomegaly, BS present. GU: No hematuria Ext: Has bilateral 3+ leg edema and tenderness, left lower leg is worse than the right, with chronic venous insufficiency changes. Musculoskeletal: No joint deformities, No joint redness or warmth, no limitation of ROM in spin. Skin: Has a small wound in the right anterior lower leg.     Neuro: Alert, oriented X3, cranial nerves II-XII grossly intact, moves all extremities normally.  Psych: Patient is not psychotic, no suicidal or hemocidal ideation.  Labs on Admission: I have personally reviewed following labs and imaging studies  CBC: Recent Labs  Lab 09/19/20 2046  WBC 9.8  NEUTROABS 6.1  HGB 19.3*  HCT 58.5*  MCV 101.6*  PLT 185   Basic Metabolic Panel: Recent Labs  Lab 09/19/20 2046  NA 137  K 4.2  CL 99  CO2 27  GLUCOSE 91  BUN 13  CREATININE 0.70  CALCIUM 9.2   GFR: Estimated Creatinine Clearance: 144.2 mL/min (by C-G formula based on SCr of 0.7 mg/dL). Liver Function Tests: No results for input(s): AST, ALT, ALKPHOS, BILITOT, PROT, ALBUMIN in the last 168 hours. No results for input(s): LIPASE, AMYLASE in the last 168 hours. Recent Labs  Lab 09/20/20 0831  AMMONIA 21   Coagulation Profile: Recent Labs  Lab 09/20/20 0513  INR 1.1   Cardiac Enzymes: No results for input(s): CKTOTAL, CKMB, CKMBINDEX, TROPONINI in the last 168 hours. BNP (last 3 results) No results for input(s): PROBNP in the last 8760 hours. HbA1C: No results for input(s): HGBA1C in the last 72 hours. CBG: Recent Labs  Lab 09/20/20 1145  GLUCAP 150*   Lipid Profile: No results for input(s): CHOL, HDL, LDLCALC, TRIG, CHOLHDL, LDLDIRECT in the last 72 hours. Thyroid Function Tests: No results for input(s): TSH,  T4TOTAL, FREET4, T3FREE, THYROIDAB in the last 72 hours. Anemia Panel: No results for input(s): VITAMINB12, FOLATE, FERRITIN, TIBC, IRON, RETICCTPCT in the last 72 hours. Urine analysis:    Component Value Date/Time   COLORURINE STRAW (A) 06/03/2019 1426   APPEARANCEUR Cloudy (A) 04/06/2020 1140   LABSPEC 1.005 06/03/2019 1426   PHURINE 6.0 06/03/2019 1426   GLUCOSEU Negative 04/06/2020 1140   HGBUR NEGATIVE 06/03/2019 1426   BILIRUBINUR Negative 04/06/2020 1140   KETONESUR NEGATIVE 06/03/2019 1426   PROTEINUR Negative 04/06/2020 1140   PROTEINUR NEGATIVE 06/03/2019 1426   NITRITE Negative 04/06/2020 1140   NITRITE NEGATIVE 06/03/2019 1426   LEUKOCYTESUR Negative 04/06/2020 1140   LEUKOCYTESUR NEGATIVE 06/03/2019 1426   Sepsis Labs: @LABRCNTIP (procalcitonin:4,lacticidven:4) ) Recent Results (from the past 240 hour(s))  Resp Panel by RT-PCR (Flu A&B, Covid) Nasopharyngeal Swab     Status: None   Collection Time: 09/20/20  5:14 AM   Specimen: Nasopharyngeal Swab; Nasopharyngeal(NP) swabs in vial transport medium  Result Value Ref Range Status   SARS Coronavirus 2 by RT PCR NEGATIVE NEGATIVE Final    Comment: (NOTE) SARS-CoV-2 target nucleic acids are NOT DETECTED.  The SARS-CoV-2 RNA is generally detectable in upper respiratory specimens during the acute phase of infection. The lowest concentration of SARS-CoV-2 viral copies this assay can detect is 138 copies/mL. A negative result does not preclude SARS-Cov-2 infection and should not be used as the sole basis for treatment or other patient management decisions. A negative result may occur with  improper specimen collection/handling, submission of specimen other than nasopharyngeal swab, presence of viral mutation(s) within the areas targeted by this assay, and inadequate number of viral copies(<138 copies/mL). A negative result must be combined with clinical observations, patient history, and epidemiological information.  The expected result is Negative.  Fact Sheet for Patients:  BloggerCourse.comhttps://www.fda.gov/media/152166/download  Fact Sheet for Healthcare Providers:  SeriousBroker.ithttps://www.fda.gov/media/152162/download  This test is no t yet approved or cleared by the [place] FDA and  has been authorized for detection and/or diagnosis of SARS-CoV-2 by FDA under an Emergency Use Authorization (EUA). This EUA will remain  in effect (meaning this test can be used) for the duration of the COVID-19 declaration under Section 564(b)(1) of the Act, 21 U.S.C.section 360bbb-3(b)(1), unless the authorization is terminated  or revoked sooner.       Influenza A by PCR NEGATIVE NEGATIVE Final   Influenza B by PCR NEGATIVE NEGATIVE  Final    Comment: (NOTE) The Xpert Xpress SARS-CoV-2/FLU/RSV plus assay is intended as an aid in the diagnosis of influenza from Nasopharyngeal swab specimens and should not be used as a sole basis for treatment. Nasal washings and aspirates are unacceptable for Xpert Xpress SARS-CoV-2/FLU/RSV testing.  Fact Sheet for Patients: BloggerCourse.com  Fact Sheet for Healthcare Providers: SeriousBroker.it  This test is not yet approved or cleared by the [place] FDA and has been authorized for detection and/or diagnosis of SARS-CoV-2 by FDA under an Emergency Use Authorization (EUA). This EUA will remain in effect (meaning this test can be used) for the duration of the COVID-19 declaration under Section 564(b)(1) of the Act, 21 U.S.C. section 360bbb-3(b)(1), unless the authorization is terminated or revoked.  Performed at [facility], [address] [place], [place] [address]      Radiological Exams on Admission: CT Angio Chest PE W and/or Wo Contrast  Result Date: 09/20/2020 CLINICAL DATA:  PE suspected.  Shortness of breath.  Leg swelling. EXAM: CT ANGIOGRAPHY CHEST WITH CONTRAST TECHNIQUE: Multidetector CT imaging of the  chest was performed using the standard protocol during bolus administration of intravenous contrast. Multiplanar CT image reconstructions and MIPs were obtained to evaluate the vascular anatomy. CONTRAST:  OMNIPAQUE IOHEXOL 350 MG/ML SOLN COMPARISON:  12/20/2019 FINDINGS: Cardiovascular: Contrast injection is sufficient to demonstrate satisfactory opacification of the pulmonary arteries to the segmental level. 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 There is chronic thrombus within the right lower lobe pulmonary artery. There is no acute pulmonary embolus. The heart size is enlarged. There is no significant pericardial effusion. There are coronary artery calcifications. There are atherosclerotic changes of the thoracic aorta without evidence for an aneurysm. 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 Mediastinum/Nodes: -- No mediastinal lymphadenopathy. -- No hilar lymphadenopathy. -- No axillary lymphadenopathy. -- No supraclavicular lymphadenopathy. -- Normal thyroid gland where visualized. -  Unremarkable esophagus. Lungs/Pleura: There are scattered areas of pleuroparenchymal scarring bilaterally. There is no pneumothorax. No large pleural effusion. The trachea is unremarkable. Upper Abdomen: Contrast bolus timing is not optimized for evaluation of the abdominal organs. There is probable hepatic steatosis. There is reflux of contrast into the IVC. Musculoskeletal: No chest wall abnormality. No bony spinal canal stenosis. Review of the MIP images confirms the above findings. IMPRESSION: 1. No acute pulmonary embolus. 2. Again noted are chronic pulmonary emboli involving the right middle lobe and right lower lobe. 3. Cardiomegaly with reflux of contrast into the IVC, suggestive of right heart failure. 4. Severe dilatation of the main pulmonary artery, which can be seen in patients with elevated pulmonary  artery pressures. 5. Scattered areas of pleuroparenchymal scarring. Aortic Atherosclerosis (ICD10-I70.0). Electronically Signed   By: Katherine Mantle M.D.   On: 09/20/2020 06:40   US Venous Img Lower Unilateral Left  Addendum Date: 09/20/2020   ADDENDUM REPORT: 09/20/2020 00:19 ADDENDUM: These results were called by telephone at the time of interpretation on 09/20/2020 at 12:19 am to provider Chiquita Loth, who verbally acknowledged these results. Electronically Signed   By: Kreg Shropshire M.D.   On: 09/20/2020 00:19   Result Date: 09/20/2020 CLINICAL DATA:  Left posterior knee pain for 3 days, history of bilateral DVT, stop Xarelto 1 month prior. Pain and edema EXAM: LEFT LOWER EXTREMITY VENOUS DOPPLER ULTRASOUND TECHNIQUE: Gray-scale sonography with compression, as well as color and duplex ultrasound, were performed to evaluate  the deep venous system(s) from the level of the common femoral vein through the popliteal and proximal calf veins. COMPARISON:  CT angio runoff 11/04/2019, and bilateral Doppler ultrasound 03/11/2019 FINDINGS: VENOUS There is incompletely occlusive thrombus extending throughout the deep venous system of the left lower extremity from the common femoral vein, into the saphenofemoral junction and profundus femoral vein as well as throughout the femoral and popliteal venous segments as visualized. Edematous changes and swelling limited visualization of the posterior tibial and peroneal vein. Additional note is made of the superficial thrombophlebitis present in the left greater saphenous vein, with a similar appearance to prior comparison imaging. Incidental note is made of partially occlusive thrombus within the contralateral right common femoral vein as well. Possibly related to extensive thrombosis seen on comparison CT angiography 11/04/2019. OTHER Edematous changes and soft tissue swelling of the lower extremity noted by sonographer. Limitations: Extensive edema patient body habitus  result in a technically challenging exam. IMPRESSION: 1. Partially occlusive thrombus throughout the visible segments of the left lower extremity deep venous system. 2. Superficial thrombosis in the left greater saphenous vein as well. 3. Additional note is made of partially occlusive thrombus within the contralateral right common femoral vein as well. Possibly related to extensive thrombosis seen on comparison CT angiography 11/04/2019. 4. Extensive edema and soft tissue swelling of the left lower extremity. Electronically Signed: By: Kreg Shropshire M.D. On: 09/20/2020 00:15     EKG: I have personally reviewed.  QTc 525, LAD, nonspecific T wave change  Assessment/Plan Principal Problem:   Acute DVT (deep venous thrombosis) (HCC) Active Problems:   AF (paroxysmal atrial fibrillation) (HCC)   Tobacco abuse   Acute on chronic respiratory failure with hypoxia (HCC)   Pulmonary embolism (HCC)   Chronic systolic CHF (congestive heart failure) (HCC)   Elevated troponin   COPD exacerbation (HCC)   Wound of right leg, initial encounter   Acute DVT (deep venous thrombosis) and hx of PE: Lower extremity Doppler showed extensive bilateral DVT.  This is due to medication noncompliance.  CT angiogram showed chronic PE.  Vascular surgeon, Dr. Gilda Crease is consulted.  -Admitted to progressive bed as an inpatient -IV heparin started --> need to switch to Xarelto, technically patient did not fail Xarelto treatment since patient has stopped taking this medication. -As needed Percocet and morphine for pain  AF (paroxysmal atrial fibrillation) [facility]): Patient is not taking medications currently.  Heart rate 112, 96 -Cardiac monitorING - is his heart rate persisently >100, will need to restart Cardizem -Patient will be on Xarelto at the discharge  Tobacco abuse -Nicotine patch  Acute on chronic respiratory failure with hypoxia due to COPD exacerbation: Patient has BNP 45.  CT angiogram did not show acute new  PE, but showed chronic PE.  He has wheezing on auscultation, indicating COPD exacerbation -Bronchodilators -Solu-Medrol 40 mg twice daily -As needed Mucinex for cough -Nasal cannula oxygen to maintain oxygen saturation above 93% on room air  Chronic systolic CHF (congestive heart failure) [facility]): Patient has bilateral leg edema, which is most likely due to DVT.  No pulmonary edema on CT angiogram.  CHF seems to be compensated.  Patient is not taking diuretics currently. -Watch volume status closely  Elevated troponin: Troponin level 91, 65, no chest pain, most likely due to demand ischemia. -Check A1c, FLP -Trend troponin -Patient is on IV heparin  Wound of right leg, initial encounter -Wound care consult         DVT ppx: on IV  Heparin   Code Status: Full code Family Communication: not done, no family member is at bed side.    Disposition Plan:  Anticipate discharge back to previous environment Consults called:  Dr. Lynford CitizenSchnnier of [facility] Admission status: Med-surg bed as inpt    Status is: Inpatient  Remains inpatient appropriate because:Inpatient level of care appropriate due to severity of illness.  Patient has some multiple comorbidities, now presents with acute extensive bilateral PE, and may need procedure.  Patient also has acute on chronic respiratory failure and elevated troponin.  His presentation is highly complicated.  Patient is at high risk of deteriorating.  Will need to be treated in the hospital for at least 2 days.   Dispo: The patient is from: Home              Anticipated d/c is to: Home              Anticipated d/c date is: 2 days              Patient currently is not medically stable to d/c.         Date of Service 09/20/2020    Lorretta HarpXilin Gwenette Wellons [facility]   If 7PM-7AM, please contact night-coverage [email or web address] 09/20/2020, 6:18 PM

## 2020-09-20 NOTE — ED Notes (Signed)
Hospitalist at bedside 

## 2020-09-20 NOTE — ED Provider Notes (Signed)
[facility] Emergency Department Provider Note  ____________________________________________  Time seen: Approximately 4:41 AM  I have reviewed the triage vital signs and the nursing notes.   HISTORY  Chief Complaint Leg Pain   HPI Matthew [name] is a 50 y.o. male with a history of several PEs and DVTs, A. fib, OSA, obesity, COPD on 3L [place], smoking  who presents for evaluation of leg pain and swelling.  Patient reports having several PEs and DVTs in the past.  He was on Xarelto but stopped taking it a month ago.  Patient is unable to give me a reason for stopping the medication.  5 days ago started having pain and swelling of the left lower extremity and fell a knot on the back of his leg.  Complain of 8 out of 10 sharp and throbbing constant pain that is worse with ambulation.  He denies chest pain or shortness of breath.  Denies cough.  Past Medical History:  Diagnosis Date  . Arthritis   . Hx of blood clots   . Sleep apnea    patient states not anymore    Patient Active Problem List   Diagnosis Date Noted  . Bilateral edema of lower extremity 03/30/2020  . History of sleep apnea 03/30/2020  . Hospital discharge follow-up 01/07/2020  . Shortness of breath 12/22/2019  . Pulmonary embolism (HCC) 12/22/2019  . Atrial fibrillation with RVR (HCC) 12/22/2019  . Acute pulmonary embolism (HCC) 12/22/2019  . Non compliance w medication regimen 12/22/2019  . COPD (chronic obstructive pulmonary disease) (HCC) 12/19/2019  . Sepsis (HCC) 12/19/2019  . Community acquired pneumonia 12/19/2019  . Acute on chronic respiratory failure with hypoxia (HCC) 12/19/2019  . OSA (obstructive sleep apnea) 12/19/2019  . Venous ulcer (HCC) 12/09/2019  . COPD with acute exacerbation (HCC)   . Class 3 severe obesity without serious comorbidity with body mass index (BMI) of 40.0 to 44.9 in adult [facility])   . Other cirrhosis of liver (HCC)   . Hypomagnesemia   . Tobacco abuse  10/29/2019  . Prediabetes 04/25/2019  . Wheeze 04/25/2019  . Ingrown toenail of right foot 04/23/2019  . AF (paroxysmal atrial fibrillation) (HCC) 03/19/2019  . DVT (deep venous thrombosis) (HCC) 03/19/2019  . Bilateral pneumonia 03/11/2019  . Acute DVT (deep venous thrombosis) (HCC) 02/17/2019  . DVT of deep femoral vein, right (HCC) 11/19/2018    Past Surgical History:  Procedure Laterality Date  . IVC FILTER INSERTION Left 11/24/2018   Procedure: IVC FILTER INSERTION;  Surgeon: Annice Needy, MD;  Location: [facility] INVASIVE CV LAB;  Service: Cardiovascular;  Laterality: Left;  . IVC FILTER INSERTION Right 02/18/2019   Procedure: IVC FILTER INSERTION WITH RIGHT LOWER EXTREMITY VENOUS LYSIS;  Surgeon: Renford Dills, MD;  Location: [facility] INVASIVE CV LAB;  Service: Cardiovascular;  Laterality: Right;  . IVC FILTER REMOVAL N/A 12/01/2018   Procedure: IVC FILTER REMOVAL;  Surgeon: Annice Needy, MD;  Location: [facility] INVASIVE CV LAB;  Service: Cardiovascular;  Laterality: N/A;  . NO PAST SURGERIES    . PERIPHERAL VASCULAR THROMBECTOMY Right 10/30/2019   Procedure: PERIPHERAL VASCULAR THROMBECTOMY / THROMBOLYSIS;  Surgeon: Annice Needy, MD;  Location: [facility] INVASIVE CV LAB;  Service: Cardiovascular;  Laterality: Right;  . PERIPHERAL VASCULAR THROMBECTOMY Right 11/05/2019   Procedure: PERIPHERAL VASCULAR THROMBECTOMY / THROMBOLYSIS;  Surgeon: Annice Needy, MD;  Location: [facility] INVASIVE CV LAB;  Service: Cardiovascular;  Laterality: Right;    Prior to Admission medications   Medication  Sig Start Date End Date Taking? Authorizing Provider  albuterol (VENTOLIN HFA) 108 (90 Base) MCG/ACT inhaler Inhale 2 puffs into the lungs every 6 (six) hours as needed for wheezing or shortness of breath. Patient not taking: Reported on 03/30/2020 11/06/19   Alford Highland, MD  diltiazem (TIAZAC) 120 MG 24 hr capsule TAKE ONE CAPSULE BY MOUTH EVERY DAY 06/28/20   Iloabachie, Chioma E, NP  nicotine (NICODERM CQ - DOSED IN  MG/24 HOURS) 14 mg/24hr patch One 14mg  patch chest wall daily (okay to substitute generic) Patient not taking: Reported on 03/30/2020 11/06/19   01/06/20, MD  rivaroxaban (XARELTO) 20 MG TABS tablet Take 1 tablet (20 mg total) by mouth daily with supper. 01/07/20   Iloabachie, Chioma E, NP  spironolactone (ALDACTONE) 25 MG tablet Take 0.5 tablets (12.5 mg total) by mouth daily for 7 days. 03/30/20 04/06/20  Iloabachie, Chioma E, NP    Allergies Amoxicillin  History reviewed. No pertinent family history.  Social History Social History   Tobacco Use  . Smoking status: Current Every Day Smoker    Packs/day: 0.50    Types: Cigarettes  . Smokeless tobacco: Current User  Vaping Use  . Vaping Use: Former  Substance Use Topics  . Alcohol use: Yes    Comment: weekends says every third weekend a 12 pack  . Drug use: Never    Review of Systems  Constitutional: Negative for fever. Eyes: Negative for visual changes. ENT: Negative for sore throat. Neck: No neck pain  Cardiovascular: Negative for chest pain. Respiratory: Negative for shortness of breath. Gastrointestinal: Negative for abdominal pain, vomiting or diarrhea. Genitourinary: Negative for dysuria. Musculoskeletal: Negative for back pain. + LLE pain Skin: Negative for rash. Neurological: Negative for headaches, weakness or numbness. Psych: No SI or HI  ____________________________________________   PHYSICAL EXAM:  VITAL SIGNS: ED Triage Vitals  Enc Vitals Group     BP 09/19/20 2026 108/80     Pulse Rate 09/19/20 2026 94     Resp 09/19/20 2026 20     Temp 09/19/20 2026 99 F (37.2 C)     Temp Source 09/19/20 2026 Oral     SpO2 09/19/20 2026 93 %     Weight 09/19/20 2028 290 lb (131.5 kg)     Height 09/19/20 2028 5\' 7"  (1.702 m)     Head Circumference --      Peak Flow --      Pain Score 09/19/20 2034 10     Pain Loc --      Pain Edu? --      Excl. in GC? --     Constitutional: Alert and oriented. Well  appearing and in no apparent distress. HEENT:      Head: Normocephalic and atraumatic.         Eyes: Conjunctivae are normal. Sclera is non-icteric.       Mouth/Throat: Mucous membranes are moist.       Neck: Supple with no signs of meningismus. Cardiovascular: Regular rate and rhythm. No murmurs, gallops, or rubs. 2+ symmetrical distal pulses are present in all extremities. No JVD. Respiratory: Normal respiratory effort. Lungs are clear to auscultation bilaterally. No wheezes, crackles, or rhonchi.  Gastrointestinal: Soft, non tender. Musculoskeletal: Asymmetric swelling of the LLE > RLE, both with changes of chronic venous stasis. Neurologic: Normal speech and language. Face is symmetric. Moving all extremities. No gross focal neurologic deficits are appreciated. Skin: Skin is warm, dry and intact. No rash noted. Psychiatric: Mood and affect  are normal. Speech and behavior are normal.  ____________________________________________   LABS (all labs ordered are listed, but only abnormal results are displayed)  Labs Reviewed  CBC WITH DIFFERENTIAL/PLATELET - Abnormal; Notable for the following components:      Result Value   Hemoglobin 19.3 (*)    HCT 58.5 (*)    MCV 101.6 (*)    All other components within normal limits  TROPONIN I (HIGH SENSITIVITY) - Abnormal; Notable for the following components:   Troponin I (High Sensitivity) 91 (*)    All other components within normal limits  RESP PANEL BY RT-PCR (FLU A&B, COVID) ARPGX2  BASIC METABOLIC PANEL  BRAIN NATRIURETIC PEPTIDE  PROTIME-INR  APTT  HEPARIN LEVEL (UNFRACTIONATED)  APTT   ____________________________________________  EKG  ED ECG REPORT I, Nita Sickle, the attending physician, personally viewed and interpreted this ECG.   ____________________________________________  RADIOLOGY  I have personally reviewed the images performed during this visit and I agree with the Radiologist's read.   Interpretation  by Radiologist:  CT Angio Chest PE W and/or Wo Contrast  Result Date: 09/20/2020 CLINICAL DATA:  PE suspected.  Shortness of breath.  Leg swelling. EXAM: CT ANGIOGRAPHY CHEST WITH CONTRAST TECHNIQUE: Multidetector CT imaging of the chest was performed using the standard protocol during bolus administration of intravenous contrast. Multiplanar CT image reconstructions and MIPs were obtained to evaluate the vascular anatomy. CONTRAST:  OMNIPAQUE IOHEXOL 350 MG/ML SOLN COMPARISON:  12/20/2019 FINDINGS: Cardiovascular: Contrast injection is sufficient to demonstrate satisfactory opacification of the pulmonary arteries to the segmental level. 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 There is chronic thrombus within the right lower lobe pulmonary artery. There is no acute pulmonary embolus. The heart size is enlarged. There is no significant pericardial effusion. There are coronary artery calcifications. There are atherosclerotic changes of the thoracic aorta without evidence for an aneurysm. 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 Mediastinum/Nodes: -- No mediastinal lymphadenopathy. -- No hilar lymphadenopathy. -- No axillary lymphadenopathy. -- No supraclavicular lymphadenopathy. -- Normal thyroid gland where visualized. -  Unremarkable esophagus. Lungs/Pleura: There are scattered areas of pleuroparenchymal scarring bilaterally. There is no pneumothorax. No large pleural effusion. The trachea is unremarkable. Upper Abdomen: Contrast bolus timing is not optimized for evaluation of the abdominal organs. There is probable hepatic steatosis. There is reflux of contrast into the IVC. Musculoskeletal: No chest wall abnormality. No bony spinal canal stenosis. Review of the MIP images confirms the above findings. IMPRESSION: 1. No acute pulmonary embolus. 2. 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 3. Cardiomegaly with reflux of contrast into the IVC, suggestive of right heart failure. 4. 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 5. Scattered areas of pleuroparenchymal scarring. Aortic Atherosclerosis (ICD10-I70.0). Electronically Signed   By: Katherine Mantle M.D.   On: 09/20/2020 06:40   US Venous Img Lower Unilateral Left  Addendum Date: 09/20/2020   ADDENDUM REPORT: 09/20/2020 00:19 ADDENDUM: These results were called by telephone at the time of interpretation on 09/20/2020 at 12:19 am to provider Chiquita Loth, who verbally acknowledged these results. Electronically Signed   By: Kreg Shropshire M.D.   On: 09/20/2020 00:19   Result Date: 09/20/2020 CLINICAL DATA:  Left posterior knee pain for 3 days, history of bilateral DVT, stop Xarelto 1 month prior. Pain  and edema EXAM: LEFT LOWER EXTREMITY VENOUS DOPPLER ULTRASOUND TECHNIQUE: Gray-scale sonography with compression, as well as color and duplex ultrasound, were performed to evaluate the deep venous system(s) from the level of the common femoral vein through the popliteal and proximal calf veins. COMPARISON:  CT angio runoff 11/04/2019, and bilateral Doppler ultrasound 03/11/2019 FINDINGS: VENOUS There is incompletely occlusive thrombus extending throughout the deep venous system of the left lower extremity from the common femoral vein, into the saphenofemoral junction and profundus femoral vein as well as throughout the femoral and popliteal venous segments as visualized. Edematous changes and swelling limited visualization of the posterior tibial and peroneal vein. Additional note is made of the superficial thrombophlebitis present in the left greater saphenous vein, with a similar appearance to prior comparison imaging. Incidental note is made of partially occlusive thrombus within the contralateral right common femoral vein as well.  Possibly related to extensive thrombosis seen on comparison CT angiography 11/04/2019. OTHER Edematous changes and soft tissue swelling of the lower extremity noted by sonographer. Limitations: Extensive edema patient body habitus result in a technically challenging exam. IMPRESSION: 1. Partially occlusive thrombus throughout the visible segments of the left lower extremity deep venous system. 2. Superficial thrombosis in the left greater saphenous vein as well. 3. Additional note is made of partially occlusive thrombus within the contralateral right common femoral vein as well. Possibly related to extensive thrombosis seen on comparison CT angiography 11/04/2019. 4. Extensive edema and soft tissue swelling of the left lower extremity. Electronically Signed: By: Kreg ShropshirePrice  DeHay M.D. On: 09/20/2020 00:15     ____________________________________________   PROCEDURES  Procedure(s) performed:yes .1-3 Lead EKG Interpretation Performed by: Nita SickleVeronese, [place], MD Authorized by: Nita SickleVeronese, [place], MD     Interpretation: non-specific     ECG rate assessment: normal     Rhythm: sinus rhythm     Ectopy: none     Critical Care performed: yes  CRITICAL CARE Performed by: Nita Sicklearolina Guyla Bless  ?  Total critical care time: 40 min  Critical care time was exclusive of separately billable procedures and treating other patients.  Critical care was necessary to treat or prevent imminent or life-threatening deterioration.  Critical care was time spent personally by me on the following activities: development of treatment plan with patient and/or surrogate as well as nursing, discussions with consultants, evaluation of patient's response to treatment, examination of patient, obtaining history from patient or surrogate, ordering and performing treatments and interventions, ordering and review of laboratory studies, ordering and review of radiographic studies, pulse oximetry and re-evaluation of patient's  condition.  ____________________________________________   INITIAL IMPRESSION / ASSESSMENT AND PLAN / ED COURSE  50 y.o. male with a history of several PEs and DVTs, A. fib, OSA, obesity, COPD on 3 L [place], smoking  who presents for evaluation of left leg pain and swelling x 5 days in the setting of non compliance with his anticoagulation. Patient denies any chest pain or shortness of breath but sats are on the low side between 91 and 93%.  Will get EKG, CT angio of the chest, troponin and BNP to rule out PE and evaluate for right heart strain.  Ultrasound visualized by me consistent with pretty extensive left DVT in last but also present right-sided DVT, confirmed by radiology.  Will start patient on IV heparin. Pain control with IV fentanyl. Will give fentanyl for pain.  Patient placed on telemetry for close monitoring.  Old medical records reviewed.  Anticipate admission to the hospitalist.  _________________________ 5:40  AM on 09/20/2020 -----------------------------------------  Patient found to be cyanotic and now hypoxic to the low 70s. Continues to deny CP and SOB. Patient placed on NRB with sats improving to 100%. On heparin. Has some wheezing bilaterally with decreased air movement and history of COPD, therefore will give duonebs. CTA pending. Will give IV bolus for preload.  _________________________ 6:56 AM on 09/20/2020 -----------------------------------------  CTA showing chronic small PEs but no acute or large PE, visualized by me confirmed by radiology. After duonebs patient's cyanosis resolved, currently on 5L satting 100%. Remains on heparin. Covid negative. Will admit to Hospitalist for DVT and COPD exacerbation    _____________________________________________ Please note:  Patient was evaluated in Emergency Department today for the symptoms described in the history of present illness. Patient was evaluated in the context of the global COVID-19 pandemic, which necessitated  consideration that the patient might be at risk for infection with the SARS-CoV-2 virus that causes COVID-19. Institutional protocols and algorithms that pertain to the evaluation of patients at risk for COVID-19 are in a state of rapid change based on information released by regulatory bodies including the CDC and federal and state organizations. These policies and algorithms were followed during the patient's care in the ED.  Some ED evaluations and interventions may be delayed as a result of limited staffing during the pandemic.   [place] Controlled Substance Database was reviewed by me. ____________________________________________   FINAL CLINICAL IMPRESSION(S) / ED DIAGNOSES   Final diagnoses:  Acute deep vein thrombosis (DVT) of both lower extremities, unspecified vein (HCC)  Acute respiratory failure with hypoxia (HCC)  COPD exacerbation (HCC)      NEW MEDICATIONS STARTED DURING THIS VISIT:  ED Discharge Orders    None       Note:  This document was prepared using Dragon voice recognition software and may include unintentional dictation errors.    Nita SickleVeronese, [place], MD 09/20/20 [phone]

## 2020-09-20 NOTE — ED Notes (Signed)
Pt provided with graham crackers and iced water per request.

## 2020-09-20 NOTE — Progress Notes (Signed)
ANTICOAGULATION CONSULT NOTE  Pharmacy Consult for Heparin Indication: chest pain/ACS  Allergies  Allergen Reactions  . Amoxicillin Rash   Patient Measurements: Height: 5\' 7"  (170.2 cm) Weight: 131.5 kg (290 lb) IBW/kg (Calculated) : 66.1 HEPARIN DW (KG): 97.3  Vital Signs: BP: 127/89 (01/18 1130) Pulse Rate: 96 (01/18 1130)  Labs: Recent Labs    09/19/20 2046 09/20/20 0513 09/20/20 0831 09/20/20 1136 09/20/20 1251  HGB 19.3*  --   --   --   --   HCT 58.5*  --   --   --   --   PLT 185  --   --   --   --   APTT  --  29  --   --  47*  LABPROT  --  14.0  --   --   --   INR  --  1.1  --   --   --   HEPARINUNFRC  --   --   --   --  0.20*  CREATININE 0.70  --   --   --   --   TROPONINIHS  --  91* 62* 57*  --     Estimated Creatinine Clearance: 144.2 mL/min (by C-G formula based on SCr of 0.7 mg/dL).   Medical History: Past Medical History:  Diagnosis Date  . Arthritis   . Hx of blood clots   . Sleep apnea    patient states not anymore    Medications:  (Not in a hospital admission)   Assessment: Pt on Xarelto 20mg  daily PTA, last dose not recorded however per notes pt verbally stated he stopped taking it 1 month ago, despite having a DVT. Heparin initiated for ACS. Baseline HL and aPTT correlate and are consistent with the patient stopping Xarelto PTA.  Heparin dosing weight: 97.3 kg H&H and plts WNL   Date Time HL Rate/comment 1/18 1251 0.2 1350 units/hr  Goal of Therapy:  Heparin level 0.3-0.7 units/ml Monitor platelets by anticoagulation protocol: Yes   Plan:  Heparin bolus 1500 units x 1 then increase infusion to 1550 units/hr Check HL ~ 6 hours Monitor daily CBC, s/s of bleed   09/20/2020,2:05 PM

## 2020-09-21 ENCOUNTER — Other Ambulatory Visit: Payer: Self-pay | Admitting: Family Medicine

## 2020-09-21 LAB — LIPID PANEL
Cholesterol: 140 mg/dL (ref 0–200)
HDL: 42 mg/dL (ref >40)
LDL Cholesterol: 87 mg/dL (ref 0–99)
Total CHOL/HDL Ratio: 3.3 RATIO
Triglycerides: 56 mg/dL (ref <150)
VLDL: 11 mg/dL (ref 0–40)

## 2020-09-21 LAB — BASIC METABOLIC PANEL
Anion gap: 7 (ref 5–15)
BUN: 11 mg/dL (ref 6–20)
CO2: 32 mmol/L (ref 22–32)
Calcium: 8.7 mg/dL — ABNORMAL LOW (ref 8.9–10.3)
Chloride: 96 mmol/L — ABNORMAL LOW (ref 98–111)
Creatinine, Ser: 0.78 mg/dL (ref 0.61–1.24)
GFR, Estimated: >60 mL/min (ref >60)
Glucose, Bld: 148 mg/dL — ABNORMAL HIGH (ref 70–99)
Potassium: 5.2 mmol/L — ABNORMAL HIGH (ref 3.5–5.1)
Sodium: 135 mmol/L (ref 135–145)

## 2020-09-21 LAB — CBC
HCT: 51.8 % (ref 39.0–52.0)
Hemoglobin: 16.6 g/dL (ref 13.0–17.0)
MCH: 33.4 pg (ref 26.0–34.0)
MCHC: 32 g/dL (ref 30.0–36.0)
MCV: 104.2 fL — ABNORMAL HIGH (ref 80.0–100.0)
Platelets: 153 10*3/uL (ref 150–400)
RBC: 4.97 MIL/uL (ref 4.22–5.81)
RDW: 14.6 % (ref 11.5–15.5)
WBC: 11.7 10*3/uL — ABNORMAL HIGH (ref 4.0–10.5)
nRBC: 0 % (ref 0.0–0.2)

## 2020-09-21 LAB — HEMOGLOBIN A1C
Hgb A1c MFr Bld: 5.7 % — ABNORMAL HIGH (ref 4.8–5.6)
Mean Plasma Glucose: 116.89 mg/dL

## 2020-09-21 LAB — BETA-2-GLYCOPROTEIN I ABS, IGG/M/A
Beta-2 Glyco I IgG: <9 GPI IgG units (ref 0–20)
Beta-2-Glycoprotein I IgA: <9 GPI IgA units (ref 0–25)
Beta-2-Glycoprotein I IgM: <9 GPI IgM units (ref 0–32)

## 2020-09-21 LAB — CBG MONITORING, ED: Glucose-Capillary: 180 mg/dL — ABNORMAL HIGH (ref 70–99)

## 2020-09-21 LAB — HOMOCYSTEINE: Homocysteine: 9.1 umol/L (ref 0.0–14.5)

## 2020-09-21 MED ORDER — RIVAROXABAN 15 MG PO TABS
15.0000 mg | ORAL_TABLET | Freq: Two times a day (BID) | ORAL | Status: DC
  Administered 2020-09-21: 15 mg via ORAL
  Filled 2020-09-21: qty 1

## 2020-09-21 MED ORDER — RIVAROXABAN 15 MG PO TABS: 15.0000 mg | ORAL_TABLET | Freq: Two times a day (BID) | ORAL | 0 refills | Status: DC

## 2020-09-21 MED ORDER — RIVAROXABAN 20 MG PO TABS: 20.0000 mg | ORAL_TABLET | Freq: Every day | ORAL | 3 refills | Status: AC

## 2020-09-21 MED ORDER — RIVAROXABAN 20 MG PO TABS: 20.0000 mg | ORAL_TABLET | Freq: Every day | ORAL | Status: DC

## 2020-09-21 NOTE — Consult Note (Signed)
WOC Nurse Consult Note: Consult received for RLE wound.  Wound was seen last evening and orders are in and active.  WOC nursing team will not follow, but will remain available to this patient, the nursing and medical teams.  Please re-consult if needed. Thanks, Ladona Mow, MSN, RN, GNP, [place]  Pager# [phone]

## 2020-09-21 NOTE — ED Notes (Signed)
Pt dressed upon entering room and states that is he is ready for discharge. Attempted to place bilateral TED hose on patient as ordred with dressing; pt refused. He stated he had compression hose at home, and knows how to put them on;took new ones home and states he will put them on at home. Refused to stay for repeat VS. States "my buddy is picking me up from lobby". Left with all of belongings. PIV removed. discussed discharge paperwork and follow-up. Pt acknowledges.

## 2020-09-21 NOTE — Discharge Summary (Signed)
Physician Discharge Summary  Matthew CrankerKelly [name] WUJ:[id number] DOB: 09/28/1970 DOA: 09/20/2020  PCP: Rolm GalaIloabachie, Matthew E, NP  Admit date: 09/20/2020 Discharge date: 09/21/2020  Admitted From: Home  Disposition:  Home   Recommendations for Outpatient Follow-up:  1. Follow up with Vascular Surgery in 1 week 2. Please refer for ongoing wound care       Home Health: None  Equipment/Devices: Compression wraps  Discharge Condition: Fair  CODE STATUS: FULL Diet recommendation: Cardiac  Brief/Interim Summary: Matthew [name] is a 50 y.o. 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  In the ER, US leg showed residual clot.  He reported nonadherence to Xarelto for the last several weeks.  He was started on heparin and vascular surgery were consulted.        PRINCIPAL HOSPITAL DIAGNOSIS: DVT    Discharge Diagnoses:   DVT Patient was started on heparin.  Vascular surgery were consulted, and no embolectomy or thrombectomy was worth potential risk.    Paitent resumed Xarelto, and given clot burden and duration of time off anticoagulation, decision was made to resume loading dose.    It was reiterated to patient the life threatening nature of not adhering to the anticoagulants that were being offered to him.     Chronic venous insufficiency Discussed with vascular surgery.  He refused compressions stockings here, said he had them at home.  Was given stockings to go, stated he would manage them himself.  I discussed follow up with Vascular surgery, who will arrange.  I was clear with patient that his medical problem requires ongoing, probably lifelong adherence to compression, low sodium diet and leg elevation, as well as close adherence to wound care appointments.   COPD with chronic hypoxic  respiratory failure No active respiratory complaints, feels at baseline.  Cirrhosis, compensated  Smoking            Discharge Instructions  Discharge Instructions    Ambulatory referral to Wound Clinic   Complete by: As directed    Diet - low sodium heart healthy   Complete by: As directed    Discharge instructions   Complete by: As directed    You were admitted for leg swelling.  This was from clot in your left leg.  You must continue your blood thinner  Pick Xarelto up today  Take rivaroxaban/Xarelto 15 mg twice daily for the next three weeks On Feb 9 -> switch to once daily  From then on, take rivaroxaban/Xarelto 20 mg nightly from now on  Don't stop this medicine  Go see Dr. Wyn [name] for leg wraps, call his office tomorrow to arrange   Discharge wound care:   Complete by: As directed    Cleanse wound with soap and water Rinse and pat dry Apply silvadene cream to wound, top with dampened gauze. Cover with gauze bandage. Apply compression stocking over Go see Dr. Wyn [name] as soon as able   Increase activity slowly   Complete by: As directed      Allergies as of 09/21/2020      Reactions   Amoxicillin Rash      Medication List    STOP taking these medications   diltiazem 120 MG 24 hr capsule Commonly known as: TIAZAC   spironolactone 25 MG tablet Commonly known as: ALDACTONE     TAKE these medications  albuterol 108 (90 Base) MCG/ACT inhaler Commonly known as: VENTOLIN HFA Inhale 2 puffs into the lungs every 6 (six) hours as needed for wheezing or shortness of breath.   nicotine 14 mg/24hr patch Commonly known as: NICODERM CQ - dosed in mg/24 hours One 14mg  patch chest wall daily (okay to substitute generic)   Rivaroxaban 15 MG Tabs tablet Commonly known as: XARELTO Take 1 tablet (15 mg total) by mouth 2 (two) times daily with a meal. What changed:   medication strength  how much to take  when to take this   rivaroxaban 20 MG Tabs  tablet Commonly known as: XARELTO Take 1 tablet (20 mg total) by mouth daily with supper. Start taking on: October 12, 2020 What changed: You were already taking a medication with the same name, and this prescription was added. Make sure you understand how and when to take each.            Discharge Care Instructions  (From admission, onward)         Start     Ordered   09/21/20 0000  Discharge wound care:       Comments: Cleanse wound with soap and water Rinse and pat dry Apply silvadene cream to wound, top with dampened gauze. Cover with gauze bandage. Apply compression stocking over Go see Dr. 09/23/20 as soon as able   09/21/20 0905          Follow-up Information    [name], Matthew E, NP. Schedule an appointment as soon as possible for a visit in 1 week(s).   Specialty: Gerontology Contact information: [address] [place] [address]        [phone], MD. Schedule an appointment as soon as possible for a visit in 1 week(s).   Specialties: Vascular Surgery, Radiology, Interventional Cardiology Contact information: [phone] [place] [phone]              Allergies  Allergen Reactions  . Amoxicillin Rash    Consultations:  Vascular surgery   Procedures/Studies: CT Angio Chest PE W and/or Wo Contrast  Result Date: 09/20/2020 CLINICAL DATA:  PE suspected.  Shortness of breath.  Leg swelling. EXAM: CT ANGIOGRAPHY CHEST WITH CONTRAST TECHNIQUE: Multidetector CT imaging of the chest was performed using the standard protocol during bolus administration of intravenous contrast. Multiplanar CT image reconstructions and MIPs were obtained to evaluate the vascular anatomy. CONTRAST:  09/22/2020 OMNIPAQUE IOHEXOL 350 MG/ML SOLN COMPARISON:  12/20/2019 FINDINGS: Cardiovascular: Contrast injection is sufficient to demonstrate satisfactory opacification of the pulmonary arteries to the segmental level. 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 There is chronic thrombus within the right lower lobe pulmonary artery. There is no acute pulmonary embolus. The heart size is enlarged. There is no significant pericardial effusion. There are coronary artery calcifications. There are atherosclerotic changes of the thoracic aorta without evidence for an aneurysm. 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 Mediastinum/Nodes: -- No mediastinal lymphadenopathy. -- No hilar lymphadenopathy. -- No axillary lymphadenopathy. -- No supraclavicular lymphadenopathy. -- Normal thyroid gland where visualized. -  Unremarkable esophagus. Lungs/Pleura: There are scattered areas of pleuroparenchymal scarring bilaterally. There is no pneumothorax. No large pleural effusion. The trachea is unremarkable. Upper Abdomen: Contrast bolus timing is not optimized for evaluation of the abdominal organs. There is probable hepatic steatosis. There is reflux of contrast into  the IVC. Musculoskeletal: No chest wall abnormality. No bony spinal canal stenosis. Review of the MIP images confirms the above findings. IMPRESSION: 1. No acute pulmonary embolus. 2. Again noted are chronic pulmonary emboli involving the right middle lobe and right lower lobe. 3. Cardiomegaly with reflux of contrast into the IVC, suggestive of right heart failure. 4. 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 5. Scattered areas of pleuroparenchymal scarring. Aortic Atherosclerosis (ICD10-I70.0). Electronically Signed   By: Katherine Mantle M.D.   On: 09/20/2020 06:40   US Venous Img Lower Unilateral Left  Addendum Date: 09/20/2020   ADDENDUM REPORT: 09/20/2020 00:19 ADDENDUM: These results were called by telephone at the time of interpretation on 09/20/2020 at 12:19 am to provider Chiquita Loth, who verbally  acknowledged these results. Electronically Signed   By: Kreg Shropshire M.D.   On: 09/20/2020 00:19   Result Date: 09/20/2020 CLINICAL DATA:  Left posterior knee pain for 3 days, history of bilateral DVT, stop Xarelto 1 month prior. Pain and edema EXAM: LEFT LOWER EXTREMITY VENOUS DOPPLER ULTRASOUND TECHNIQUE: Gray-scale sonography with compression, as well as color and duplex ultrasound, were performed to evaluate the deep venous system(s) from the level of the common femoral vein through the popliteal and proximal calf veins. COMPARISON:  CT angio runoff 11/04/2019, and bilateral Doppler ultrasound 03/11/2019 FINDINGS: VENOUS There is incompletely occlusive thrombus extending throughout the deep venous system of the left lower extremity from the common femoral vein, into the saphenofemoral junction and profundus femoral vein as well as throughout the femoral and popliteal venous segments as visualized. Edematous changes and swelling limited visualization of the posterior tibial and peroneal vein. Additional note is made of the superficial thrombophlebitis present in the left greater saphenous vein, with a similar appearance to prior comparison imaging. Incidental note is made of partially occlusive thrombus within the contralateral right common femoral vein as well. Possibly related to extensive thrombosis seen on comparison CT angiography 11/04/2019. OTHER Edematous changes and soft tissue swelling of the lower extremity noted by sonographer. Limitations: Extensive edema patient body habitus result in a technically challenging exam. IMPRESSION: 1. Partially occlusive thrombus throughout the visible segments of the left lower extremity deep venous system. 2. Superficial thrombosis in the left greater saphenous vein as well. 3. Additional note is made of partially occlusive thrombus within the contralateral right common femoral vein as well. Possibly related to extensive thrombosis seen on comparison CT angiography  11/04/2019. 4. Extensive edema and soft tissue swelling of the left lower extremity. Electronically Signed: By: Kreg Shropshire M.D. On: 09/20/2020 00:15       Subjective: No chest pain.  Leg swelling and pain are at baselin.  No dyspnea, no wheezing, no sputum, no cough, no confusion, no fever.  Discharge Exam: Vitals:   09/21/20 0511 09/21/20 0809  BP: 96/66 (!) 113/96  Pulse: (!) 104 98  Resp: 15 18  Temp:    SpO2: 92% 98%   Vitals:   09/21/20 0345 09/21/20 0400 09/21/20 0511 09/21/20 0809  BP:   96/66 (!) 113/96  Pulse: 94 97 (!) 104 98  Resp:   15 18  Temp:      TempSrc:      SpO2: 97%  92% 98%  Weight:      Height:        General: Pt is alert, awake, not in acute distress, sitting in bed Cardiovascular: RRR, nl S1-S2, no murmurs appreciated.  CHronic LE edema, right shin venous insufficency ulcer, brawny change.   Respiratory: Normal respiratory rate and rhythm.  CTAB without rales or wheezes. Abdominal: Abdomen soft and non-tender.  No distension or HSM.   Neuro/Psych: Strength symmetric in upper and lower extremities.  Judgment and insight appear normal.   The results of significant diagnostics from this hospitalization (including imaging, microbiology, ancillary and laboratory) are listed below for reference.     Microbiology: Recent Results (from the past 240 hour(s))  Resp Panel by RT-PCR (Flu A&B, Covid) Nasopharyngeal Swab     Status: None   Collection Time: 09/20/20  5:14 AM   Specimen: Nasopharyngeal Swab; Nasopharyngeal(NP) swabs in vial transport medium  Result Value Ref Range Status   SARS Coronavirus 2 by RT PCR NEGATIVE NEGATIVE Final    Comment: (NOTE) SARS-CoV-2 target nucleic acids are NOT DETECTED.  The SARS-CoV-2 RNA is generally detectable in upper respiratory specimens during the acute phase of infection. The lowest concentration of SARS-CoV-2 viral copies this assay can detect is 138 copies/mL. A negative result does not preclude  SARS-Cov-2 infection and should not be used as the sole basis for treatment or other patient management decisions. A negative result may occur with  improper specimen collection/handling, submission of specimen other than nasopharyngeal swab, presence of viral mutation(s) within the areas targeted by this assay, and inadequate number of viral copies(<138 copies/mL). A negative result must be combined with clinical observations, patient history, and epidemiological information. The expected result is Negative.  Fact Sheet for Patients:  BloggerCourse.comhttps://www.fda.gov/media/152166/download  Fact Sheet for Healthcare Providers:  SeriousBroker.ithttps://www.fda.gov/media/152162/download  This test is no t yet approved or cleared by the [place] FDA and  has been authorized for detection and/or diagnosis of SARS-CoV-2 by FDA under an Emergency Use Authorization (EUA). This EUA will remain  in effect (meaning this test can be used) for the duration of the COVID-19 declaration under Section 564(b)(1) of the Act, 21 U.S.C.section 360bbb-3(b)(1), unless the authorization is terminated  or revoked sooner.       Influenza A by PCR NEGATIVE NEGATIVE Final   Influenza B by PCR NEGATIVE NEGATIVE Final    Comment: (NOTE) The Xpert Xpress SARS-CoV-2/FLU/RSV plus assay is intended as an aid in the diagnosis of influenza from Nasopharyngeal swab specimens and should not be used as a sole basis for treatment. Nasal washings and aspirates are unacceptable for Xpert Xpress SARS-CoV-2/FLU/RSV testing.  Fact Sheet for Patients: BloggerCourse.comhttps://www.fda.gov/media/152166/download  Fact Sheet for Healthcare Providers: SeriousBroker.ithttps://www.fda.gov/media/152162/download  This test is not yet approved or cleared by the [place] FDA and has been authorized for detection and/or diagnosis of SARS-CoV-2 by FDA under an Emergency Use Authorization (EUA). This EUA will remain in effect (meaning this test can be used) for the duration of  the COVID-19 declaration under Section 564(b)(1) of the Act, 21 U.S.C. section 360bbb-3(b)(1), unless the authorization is terminated or revoked.  Performed at [facility], [address] [place], [place] [id number]      Labs: BNP (last 3 results) Recent Labs    12/22/19 1136 04/06/20 1140 09/20/20 0513  BNP 146.0* 18.9 45.4   Basic Metabolic Panel: Recent Labs  Lab 09/19/20 2046 09/21/20 0750  NA 137 135  K 4.2 5.2*  CL 99 96*  CO2 27 32  GLUCOSE 91 148*  BUN 13 11  CREATININE 0.70 0.78  CALCIUM 9.2 8.7*   Liver Function Tests: No results for input(s): AST, ALT, ALKPHOS, BILITOT, PROT, ALBUMIN in the last 168 hours. No  results for input(s): LIPASE, AMYLASE in the last 168 hours. Recent Labs  Lab 09/20/20 0831  AMMONIA 21   CBC: Recent Labs  Lab 09/19/20 2046 09/21/20 0750  WBC 9.8 11.7*  NEUTROABS 6.1  --   HGB 19.3* 16.6  HCT 58.5* 51.8  MCV 101.6* 104.2*  PLT 185 153   Cardiac Enzymes: No results for input(s): CKTOTAL, CKMB, CKMBINDEX, TROPONINI in the last 168 hours. BNP: Invalid input(s): POCBNP CBG: Recent Labs  Lab 09/20/20 1145 09/21/20 0802  GLUCAP 150* 180*   D-Dimer No results for input(s): DDIMER in the last 72 hours. Hgb A1c No results for input(s): HGBA1C in the last 72 hours. Lipid Profile Recent Labs    09/21/20 0750  CHOL 140  HDL 42  LDLCALC 87  TRIG 56  CHOLHDL 3.3   Thyroid function studies No results for input(s): TSH, T4TOTAL, T3FREE, THYROIDAB in the last 72 hours.  Invalid input(s): FREET3 Anemia work up No results for input(s): VITAMINB12, FOLATE, FERRITIN, TIBC, IRON, RETICCTPCT in the last 72 hours. Urinalysis    Component Value Date/Time   COLORURINE STRAW (A) 06/03/2019 1426   APPEARANCEUR Cloudy (A) 04/06/2020 1140   LABSPEC 1.005 06/03/2019 1426   PHURINE 6.0 06/03/2019 1426   GLUCOSEU Negative 04/06/2020 1140   HGBUR NEGATIVE 06/03/2019 1426   BILIRUBINUR Negative 04/06/2020 1140    KETONESUR NEGATIVE 06/03/2019 1426   PROTEINUR Negative 04/06/2020 1140   PROTEINUR NEGATIVE 06/03/2019 1426   NITRITE Negative 04/06/2020 1140   NITRITE NEGATIVE 06/03/2019 1426   LEUKOCYTESUR Negative 04/06/2020 1140   LEUKOCYTESUR NEGATIVE 06/03/2019 1426   Sepsis Labs Invalid input(s): PROCALCITONIN,  WBC,  LACTICIDVEN Microbiology Recent Results (from the past 240 hour(s))  Resp Panel by RT-PCR (Flu A&B, Covid) Nasopharyngeal Swab     Status: None   Collection Time: 09/20/20  5:14 AM   Specimen: Nasopharyngeal Swab; Nasopharyngeal(NP) swabs in vial transport medium  Result Value Ref Range Status   SARS Coronavirus 2 by RT PCR NEGATIVE NEGATIVE Final    Comment: (NOTE) SARS-CoV-2 target nucleic acids are NOT DETECTED.  The SARS-CoV-2 RNA is generally detectable in upper respiratory specimens during the acute phase of infection. The lowest concentration of SARS-CoV-2 viral copies this assay can detect is 138 copies/mL. A negative result does not preclude SARS-Cov-2 infection and should not be used as the sole basis for treatment or other patient management decisions. A negative result may occur with  improper specimen collection/handling, submission of specimen other than nasopharyngeal swab, presence of viral mutation(s) within the areas targeted by this assay, and inadequate number of viral copies(<138 copies/mL). A negative result must be combined with clinical observations, patient history, and epidemiological information. The expected result is Negative.  Fact Sheet for Patients:  BloggerCourse.com  Fact Sheet for Healthcare Providers:  SeriousBroker.it  This test is no t yet approved or cleared by the [place] FDA and  has been authorized for detection and/or diagnosis of SARS-CoV-2 by FDA under an Emergency Use Authorization (EUA). This EUA will remain  in effect (meaning this test can be used) for the duration  of the COVID-19 declaration under Section 564(b)(1) of the Act, 21 U.S.C.section 360bbb-3(b)(1), unless the authorization is terminated  or revoked sooner.       Influenza A by PCR NEGATIVE NEGATIVE Final   Influenza B by PCR NEGATIVE NEGATIVE Final    Comment: (NOTE) The Xpert Xpress SARS-CoV-2/FLU/RSV plus assay is intended as an aid in the diagnosis of influenza from Nasopharyngeal swab specimens  and should not be used as a sole basis for treatment. Nasal washings and aspirates are unacceptable for Xpert Xpress SARS-CoV-2/FLU/RSV testing.  Fact Sheet for Patients: BloggerCourse.com  Fact Sheet for Healthcare Providers: SeriousBroker.it  This test is not yet approved or cleared by the [place] FDA and has been authorized for detection and/or diagnosis of SARS-CoV-2 by FDA under an Emergency Use Authorization (EUA). This EUA will remain in effect (meaning this test can be used) for the duration of the COVID-19 declaration under Section 564(b)(1) of the Act, 21 U.S.C. section 360bbb-3(b)(1), unless the authorization is terminated or revoked.  Performed at [facility], [address] [place], [place] [address]      Time coordinating discharge: 35 minutes       SIGNED:   Alberteen Sam, MD  [facility] 09/21/2020, 9:08 AM

## 2020-09-21 NOTE — Discharge Instructions (Signed)
Please start wearing medical grade 1 compression socks to both of your legs.  Legs the socks in the a.m. and remove them in the p.m.  You do not need to sleep in your compression socks. Please elevate your legs heart level higher as much as possible.

## 2020-09-21 NOTE — Progress Notes (Signed)
ANTICOAGULATION CONSULT NOTE  Pharmacy Consult for Heparin Indication: chest pain/ACS  Allergies  Allergen Reactions  . Amoxicillin Rash   Patient Measurements: Height: 5\' 7"  (170.2 cm) Weight: 131.5 kg (290 lb) IBW/kg (Calculated) : 66.1 HEPARIN DW (KG): 97.3  Vital Signs: BP: 96/66 (01/19 0511) Pulse Rate: 104 (01/19 0511)  Labs: Recent Labs    09/19/20 2046 09/20/20 0513 09/20/20 0513 09/20/20 0831 09/20/20 1136 09/20/20 1251 09/20/20 1333 09/20/20 2125  HGB 19.3*  --   --   --   --   --   --   --   HCT 58.5*  --   --   --   --   --   --   --   PLT 185  --   --   --   --   --   --   --   APTT  --  29  --   --   --  47*  --   --   LABPROT  --  14.0  --   --   --   --   --   --   INR  --  1.1  --   --   --   --   --   --   HEPARINUNFRC  --   --   --   --   --  0.20*  --  0.27*  CREATININE 0.70  --   --   --   --   --   --   --   TROPONINIHS  --  91*   < > 62* 57*  --  39*  --    < > = values in this interval not displayed.    Estimated Creatinine Clearance: 144.2 mL/min (by C-G formula based on SCr of 0.7 mg/dL).   Medical History: Past Medical History:  Diagnosis Date  . Arthritis   . Hx of blood clots   . Sleep apnea    patient states not anymore    Medications:  (Not in a hospital admission)   Assessment: Pt on Xarelto 20mg  daily PTA, last dose not recorded however per notes pt verbally stated he stopped taking it 1 month ago, despite having a DVT. Heparin initiated for ACS - pharmacy now transitioning back to Xarelto.   Baseline HL and aPTT correlate and are consistent with the patient stopping Xarelto PTA.   The patient has an IVC filter which was placed in June 2021.  Per vascular, patient does have a history of noncompliance with medications resulting in the propagation of his DVT and PE in the past.   Patient states that he stopped taking his Xarelto on his own about a month or 2 ago.   Plan:  Will restart treatment dosing of Xarelto based  on symptoms and history of non-compliance.  Xarelto 15mg  bid x 21 days, followed by Xarelto 20mg  daily.    , PharmD, BCPS Clinical Pharmacist 09/21/2020 8:15 AM

## 2020-09-22 LAB — CARDIOLIPIN ANTIBODIES, IGG, IGM, IGA
Anticardiolipin IgA: <9 APL U/mL (ref 0–11)
Anticardiolipin IgG: <9 GPL U/mL (ref 0–14)
Anticardiolipin IgM: <9 MPL U/mL (ref 0–12)

## 2020-09-22 LAB — PROTEIN C, TOTAL: Protein C, Total: 63 % (ref 60–150)

## 2020-09-23 ENCOUNTER — Encounter (INDEPENDENT_AMBULATORY_CARE_PROVIDER_SITE_OTHER): Payer: Self-pay

## 2020-09-24 LAB — LUPUS ANTICOAGULANT PANEL
DRVVT: 33.5 s (ref 0.0–47.0)
PTT Lupus Anticoagulant: 38.8 s (ref 0.0–51.9)

## 2020-09-24 LAB — PROTEIN S, TOTAL: Protein S Ag, Total: 79 % (ref 60–150)

## 2020-09-24 LAB — PROTEIN C ACTIVITY: Protein C Activity: 73 % (ref 73–180)

## 2020-09-24 LAB — PROTEIN S ACTIVITY: Protein S Activity: 70 % (ref 63–140)

## 2020-09-26 LAB — FACTOR 5 LEIDEN

## 2020-09-28 LAB — PROTHROMBIN GENE MUTATION

## 2020-10-03 ENCOUNTER — Telehealth: Payer: Self-pay | Admitting: Gerontology

## 2020-10-03 NOTE — Telephone Encounter (Signed)
SW patient to inform of hospital follow up on 2/8 per 2/18 staff msg. Patient declined appt bc he works out of town.

## 2020-10-11 ENCOUNTER — Ambulatory Visit: Payer: Self-pay | Admitting: Gerontology

## 2020-11-09 ENCOUNTER — Other Ambulatory Visit: Payer: Self-pay

## 2020-11-09 ENCOUNTER — Emergency Department: Payer: Self-pay

## 2020-11-09 ENCOUNTER — Encounter: Payer: Self-pay | Admitting: Emergency Medicine

## 2020-11-09 ENCOUNTER — Emergency Department
Admission: EM | Admit: 2020-11-09 | Discharge: 2020-11-09 | Disposition: A | Discharge status: Home / Self Care | Payer: Self-pay | Attending: Emergency Medicine | Admitting: Emergency Medicine

## 2020-11-09 DIAGNOSIS — L97819 Non-pressure chronic ulcer of other part of right lower leg with unspecified severity: Secondary | ICD-10-CM | POA: Insufficient documentation

## 2020-11-09 DIAGNOSIS — Z7901 Long term (current) use of anticoagulants: Secondary | ICD-10-CM | POA: Insufficient documentation

## 2020-11-09 DIAGNOSIS — I5043 Acute on chronic combined systolic (congestive) and diastolic (congestive) heart failure: Secondary | ICD-10-CM | POA: Insufficient documentation

## 2020-11-09 DIAGNOSIS — L97829 Non-pressure chronic ulcer of other part of left lower leg with unspecified severity: Secondary | ICD-10-CM | POA: Insufficient documentation

## 2020-11-09 DIAGNOSIS — L03116 Cellulitis of left lower limb: Secondary | ICD-10-CM | POA: Insufficient documentation

## 2020-11-09 DIAGNOSIS — I872 Venous insufficiency (chronic) (peripheral): Secondary | ICD-10-CM

## 2020-11-09 DIAGNOSIS — R609 Edema, unspecified: Secondary | ICD-10-CM | POA: Insufficient documentation

## 2020-11-09 DIAGNOSIS — F17210 Nicotine dependence, cigarettes, uncomplicated: Secondary | ICD-10-CM | POA: Insufficient documentation

## 2020-11-09 DIAGNOSIS — I87332 Chronic venous hypertension (idiopathic) with ulcer and inflammation of left lower extremity: Secondary | ICD-10-CM | POA: Insufficient documentation

## 2020-11-09 DIAGNOSIS — J449 Chronic obstructive pulmonary disease, unspecified: Secondary | ICD-10-CM | POA: Insufficient documentation

## 2020-11-09 LAB — BASIC METABOLIC PANEL
Anion gap: 6 (ref 5–15)
BUN: 10 mg/dL (ref 6–20)
CO2: 32 mmol/L (ref 22–32)
Calcium: 8.9 mg/dL (ref 8.9–10.3)
Chloride: 102 mmol/L (ref 98–111)
Creatinine, Ser: 0.73 mg/dL (ref 0.61–1.24)
GFR, Estimated: >60 mL/min (ref >60)
Glucose, Bld: 103 mg/dL — ABNORMAL HIGH (ref 70–99)
Potassium: 4.8 mmol/L (ref 3.5–5.1)
Sodium: 140 mmol/L (ref 135–145)

## 2020-11-09 LAB — CBC
HCT: 49.5 % (ref 39.0–52.0)
Hemoglobin: 15.8 g/dL (ref 13.0–17.0)
MCH: 33 pg (ref 26.0–34.0)
MCHC: 31.9 g/dL (ref 30.0–36.0)
MCV: 103.3 fL — ABNORMAL HIGH (ref 80.0–100.0)
Platelets: 184 10*3/uL (ref 150–400)
RBC: 4.79 MIL/uL (ref 4.22–5.81)
RDW: 15.6 % — ABNORMAL HIGH (ref 11.5–15.5)
WBC: 8.2 10*3/uL (ref 4.0–10.5)
nRBC: 0 % (ref 0.0–0.2)

## 2020-11-09 LAB — TROPONIN I (HIGH SENSITIVITY): Troponin I (High Sensitivity): 14 ng/L (ref <18)

## 2020-11-09 MED ORDER — CEPHALEXIN 500 MG PO CAPS: 500.0000 mg | ORAL_CAPSULE | Freq: Four times a day (QID) | ORAL | 0 refills | Status: AC

## 2020-11-09 MED ORDER — OXYCODONE-ACETAMINOPHEN 5-325 MG PO TABS
1.0000 | ORAL_TABLET | Freq: Once | ORAL | Status: AC
  Administered 2020-11-09: 1 via ORAL
  Filled 2020-11-09: qty 1

## 2020-11-09 MED ORDER — FUROSEMIDE 20 MG PO TABS: 20.0000 mg | ORAL_TABLET | Freq: Every day | ORAL | 0 refills | Status: DC

## 2020-11-09 MED ORDER — FUROSEMIDE 10 MG/ML IJ SOLN
40.0000 mg | Freq: Once | INTRAMUSCULAR | Status: AC
  Administered 2020-11-09: 40 mg via INTRAVENOUS
  Filled 2020-11-09: qty 4

## 2020-11-09 NOTE — ED Notes (Signed)
EDP at bedside  

## 2020-11-09 NOTE — ED Provider Notes (Signed)
[facility] Emergency Department Provider Note   ____________________________________________   Event Date/Time   First MD Initiated Contact with Patient 11/09/20 1153     (approximate)  I have reviewed the triage vital signs and the nursing notes.   HISTORY  Chief Complaint Open Wound, Leg Swelling, and Edema    HPI Matthew [name] is a 50 y.o. male with past medical history of CHF, COPD on 2 L nasal cannula, DVT/PE on Xarelto, and chronic venous insufficiency presents to the ED complaining of leg swelling and pain.  Patient reports that he has been dealing with increasing pain and swelling to each of his legs over the past couple of weeks.  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  Bleeding has now stopped, but he states he deals with leakage of fluid from his legs almost every night.  He has some mild difficulty breathing but denies any fevers, cough, or pain in his chest.  He states he has been compliant with his Xarelto, but denies taking any medicine for fluid retention.  He has also noticed a sore opening up over his left shin with some surrounding redness.        Past Medical History:  Diagnosis Date  . Arthritis   . Hx of blood clots   . Sleep apnea    patient states not anymore    Patient Active Problem List   Diagnosis Date Noted  . Chronic systolic CHF (congestive heart failure) (HCC) 09/20/2020  . Elevated troponin 09/20/2020  . Wound of right leg, initial encounter 09/20/2020  . COPD exacerbation (HCC)   . Bilateral edema of lower extremity 03/30/2020  . History of sleep apnea 03/30/2020  . Hospital discharge follow-up 01/07/2020  . Shortness of breath 12/22/2019  . Pulmonary embolism (HCC) 12/22/2019  . Atrial fibrillation with RVR (HCC) 12/22/2019  . Acute pulmonary embolism (HCC) 12/22/2019  . Non compliance w medication regimen 12/22/2019  .  COPD (chronic obstructive pulmonary disease) (HCC) 12/19/2019  . Sepsis (HCC) 12/19/2019  . Community acquired pneumonia 12/19/2019  . Acute on chronic respiratory failure with hypoxia (HCC) 12/19/2019  . OSA (obstructive sleep apnea) 12/19/2019  . Venous ulcer (HCC) 12/09/2019  . COPD with acute exacerbation (HCC)   . Class 3 severe obesity without serious comorbidity with body mass index (BMI) of 40.0 to 44.9 in adult [facility])   . Other cirrhosis of liver (HCC)   . Hypomagnesemia   . Tobacco abuse 10/29/2019  . Prediabetes 04/25/2019  . Wheeze 04/25/2019  . Ingrown toenail of right foot 04/23/2019  . AF (paroxysmal atrial fibrillation) (HCC) 03/19/2019  . DVT (deep venous thrombosis) (HCC) 03/19/2019  . Bilateral pneumonia 03/11/2019  . Acute DVT (deep venous thrombosis) (HCC) 02/17/2019  . DVT of deep femoral vein, right (HCC) 11/19/2018    Past Surgical History:  Procedure Laterality Date  . IVC FILTER INSERTION Left 11/24/2018   Procedure: IVC FILTER INSERTION;  Surgeon: Annice Needy, MD;  Location: [facility] INVASIVE CV LAB;  Service: Cardiovascular;  Laterality: Left;  . IVC FILTER INSERTION Right 02/18/2019   Procedure: IVC FILTER INSERTION WITH RIGHT LOWER EXTREMITY VENOUS LYSIS;  Surgeon: Renford Dills, MD;  Location: [facility] INVASIVE CV LAB;  Service: Cardiovascular;  Laterality: Right;  . IVC FILTER REMOVAL N/A 12/01/2018   Procedure: IVC FILTER REMOVAL;  Surgeon: Annice Needy, MD;  Location: [facility] INVASIVE CV  LAB;  Service: Cardiovascular;  Laterality: N/A;  . NO PAST SURGERIES    . PERIPHERAL VASCULAR THROMBECTOMY Right 10/30/2019   Procedure: PERIPHERAL VASCULAR THROMBECTOMY / THROMBOLYSIS;  Surgeon: Annice Needyew, Jason S, MD;  Location: [facility] INVASIVE CV LAB;  Service: Cardiovascular;  Laterality: Right;  . PERIPHERAL VASCULAR THROMBECTOMY Right 11/05/2019   Procedure: PERIPHERAL VASCULAR THROMBECTOMY / THROMBOLYSIS;  Surgeon: Annice Needyew, Jason S, MD;  Location: [facility] INVASIVE CV LAB;  Service:  Cardiovascular;  Laterality: Right;    Prior to Admission medications   Medication Sig Start Date End Date Taking? Authorizing Provider  cephALEXin (KEFLEX) 500 MG capsule Take 1 capsule (500 mg total) by mouth 4 (four) times daily for 7 days. 11/09/20 11/16/20 Yes Chesley NoonJessup, Charles, MD  furosemide (LASIX) 20 MG tablet Take 1 tablet (20 mg total) by mouth daily for 7 days. 11/09/20 11/16/20 Yes Chesley NoonJessup, Charles, MD  albuterol (VENTOLIN HFA) 108 (90 Base) MCG/ACT inhaler Inhale 2 puffs into the lungs every 6 (six) hours as needed for wheezing or shortness of breath. Patient not taking: No sig reported 11/06/19   Alford HighlandWieting, Richard, MD  nicotine (NICODERM CQ - DOSED IN MG/24 HOURS) 14 mg/24hr patch One 14mg  patch chest wall daily (okay to substitute generic) Patient not taking: No sig reported 11/06/19   Alford HighlandWieting, Richard, MD  Rivaroxaban (XARELTO) 15 MG TABS tablet Take 1 tablet (15 mg total) by mouth 2 (two) times daily with a meal. 09/21/20   Danford, Earl Liteshristopher P, MD  rivaroxaban (XARELTO) 20 MG TABS tablet Take 1 tablet (20 mg total) by mouth daily with supper. 10/12/20   Danford, Earl Liteshristopher P, MD    Allergies Amoxicillin  History reviewed. No pertinent family history.  Social History Social History   Tobacco Use  . Smoking status: Current Every Day Smoker    Packs/day: 0.50    Types: Cigarettes  . Smokeless tobacco: Current User  Vaping Use  . Vaping Use: Former  Substance Use Topics  . Alcohol use: Yes    Comment: weekends says every third weekend a 12 pack  . Drug use: Never    Review of Systems  Constitutional: No fever/chills Eyes: No visual changes. ENT: No sore throat. Cardiovascular: Denies chest pain. Respiratory: Positive for shortness of breath. Gastrointestinal: No abdominal pain.  No nausea, no vomiting.  No diarrhea.  No constipation. Genitourinary: Negative for dysuria. Musculoskeletal: Negative for back pain.  Positive for leg swelling. Skin: Negative for rash.   Positive for leg wound. Neurological: Negative for headaches, focal weakness or numbness.  ____________________________________________   PHYSICAL EXAM:  VITAL SIGNS: ED Triage Vitals  Enc Vitals Group     BP 11/09/20 1059 122/78     Pulse Rate 11/09/20 1059 92     Resp 11/09/20 1059 19     Temp 11/09/20 1059 98.3 F (36.8 C)     Temp Source 11/09/20 1059 Oral     SpO2 11/09/20 1059 (!) 89 %     Weight 11/09/20 1100 (!) 301 lb 3.2 oz (136.6 kg)     Height 11/09/20 1100 5\' 7"  (1.702 m)     Head Circumference --      Peak Flow --      Pain Score 11/09/20 1100 8     Pain Loc --      Pain Edu? --      Excl. in GC? --     Constitutional: Alert and oriented. Eyes: Conjunctivae are normal. Head: Atraumatic. Nose: No congestion/rhinnorhea. Mouth/Throat: Mucous membranes are moist. Neck: Normal  ROM Cardiovascular: Normal rate, regular rhythm. Grossly normal heart sounds.  2+ DP pulses bilaterally. Respiratory: Normal respiratory effort.  No retractions. Lungs CTAB. Gastrointestinal: Soft and nontender. No distention. Genitourinary: deferred Musculoskeletal: 2+ pitting edema to knees bilaterally with diffuse associated tenderness.  Central sore to right shin draining clear fluid, additional sore to left anterior shin without any drainage but with surrounding erythema and warmth. Neurologic:  Normal speech and language. No gross focal neurologic deficits are appreciated. Skin:  Skin is warm, dry and intact. No rash noted. Psychiatric: Mood and affect are normal. Speech and behavior are normal.  ____________________________________________   LABS (all labs ordered are listed, but only abnormal results are displayed)  Labs Reviewed  BASIC METABOLIC PANEL - Abnormal; Notable for the following components:      Result Value   Glucose, Bld 103 (*)    All other components within normal limits  CBC - Abnormal; Notable for the following components:   MCV 103.3 (*)    RDW 15.6 (*)     All other components within normal limits  TROPONIN I (HIGH SENSITIVITY)   ____________________________________________  EKG  ED ECG REPORT I, Chesley Noon, the attending physician, personally viewed and interpreted this ECG.   Date: 11/09/2020  EKG Time: 11:10  Rate: 86  Rhythm: atrial fibrillation, rate 86  Axis: LAD  Intervals:none  ST&T Change: None   PROCEDURES  Procedure(s) performed (including Critical Care):  Procedures   ____________________________________________   INITIAL IMPRESSION / ASSESSMENT AND PLAN / ED COURSE       50 year old male with past medical history of CHF, COPD, DVT/PE on Xarelto, and venous insufficiency who presents to the ED complaining of increasing leg swelling and pain with 2 separate wounds to each leg.  He has significant edema to both lower extremities but pulses are intact in both feet.  He does appear fluid overloaded and chest x-ray reviewed by me with mild pulmonary vascular congestion.  He does have a history of CHF and given his current fluid overload, we will treat with IV Lasix.  Troponin within normal limits and EKG shows no evidence of ischemia, low suspicion for ACS.  He does have apparent atrial fibrillation but has a history of this and rate is controlled, patient already anticoagulated on Xarelto.  He was offered ultrasound of his lower extremities to assess for recurrent DVT, but refuses this and states current symptoms do not feel similar to DVT.  Low suspicion for PE at this time given minimal chest pain or shortness of breath.  I am concerned on top of his lower extremity edema he is developing infection to the wound at his left anterior shin.  We will treat pain, diuresis, and plan to start on antibiotics.  He will require close cardiology follow-up for apparent CHF exacerbation with new onset atrial fibrillation.  Patient reports feeling better following IV dose of Lasix, has diuresed a significant amount here in the ED.   He continues to maintain his O2 saturations on his usual 2 L nasal cannula.  He is appropriate for discharge home and we will prescribe short course of Lasix, patient also provided with referral to heart failure clinic.  We will apply dressing to his lower extremities and start patient on Keflex for suspected superimposed cellulitis.  He was counseled to return to the ED for new worsening symptoms, patient agrees with plan.      ____________________________________________   FINAL CLINICAL IMPRESSION(S) / ED DIAGNOSES  Final diagnoses:  Peripheral edema  Acute on chronic combined systolic and diastolic congestive heart failure (HCC)  Left leg cellulitis  Venous stasis dermatitis of both lower extremities     ED Discharge Orders         Ordered    AMB referral to CHF clinic        11/09/20 1445    furosemide (LASIX) 20 MG tablet  Daily        11/09/20 1446    cephALEXin (KEFLEX) 500 MG capsule  4 times daily        11/09/20 1446           Note:  This document was prepared using Dragon voice recognition software and may include unintentional dictation errors.   Chesley Noon, MD 11/09/20 1451

## 2020-11-09 NOTE — ED Notes (Signed)
Venous stasis ulcers wrapped/wound care performed per EDP verbal order.

## 2020-11-09 NOTE — ED Notes (Signed)
IV insertion attempted one time by this nurse. Pt did not tolerate well. Float nurse will come to start IV.

## 2020-11-09 NOTE — ED Notes (Signed)
Pt to ED c/o open leg wounds with weeping of one leg wound. Upon examination, both lower legs have thickening of skin, purple coloration with obvious venous congestion and swelling and +1 pitting edema. Up knees. Pt denies pain in legs but says that feels abdominal discomfort. Pt wears 2L oxygen at home and is currently on 2L per [place].  Leg wounds appear to be chronic venous ulcers. Has one L leg that is weeping, has 2 smaller ulcers on L leg.

## 2020-11-09 NOTE — ED Triage Notes (Addendum)
Pt comes into the ED via POV c/o leg swelling and open weeping wound on the right lower leg.  Pt states he initially got the wound back in December and it still is healing.  Pt presents with blood on his sock and where the wound is weeping.  Pt also presents with leg swelling.  Pt denies being diabetic.  Pt in NAD at this time.  Some redness present on the lower leg, but no local swelling and redness to the wound. Pt also presents with abdominal swelling that he states started about 3 weeks ago.  H/o peripheral vascular problems. Pt denies any SHOB, but patient is at 89% room air.  Pt states he is supposed to be on 3L nasal cannula but is non-compliant with his O2 and his blood thinning medications.

## 2020-11-18 ENCOUNTER — Encounter: Payer: Self-pay | Admitting: Family

## 2020-11-18 ENCOUNTER — Other Ambulatory Visit: Payer: Self-pay | Admitting: Family

## 2020-11-18 ENCOUNTER — Ambulatory Visit: Payer: Self-pay | Attending: Family | Admitting: Family

## 2020-11-18 ENCOUNTER — Other Ambulatory Visit: Payer: Self-pay

## 2020-11-18 VITALS — BP 129/76 | HR 96 | Resp 20 | Ht 67.0 in | Wt 287.2 lb

## 2020-11-18 DIAGNOSIS — I890 Lymphedema, not elsewhere classified: Secondary | ICD-10-CM | POA: Insufficient documentation

## 2020-11-18 DIAGNOSIS — I4891 Unspecified atrial fibrillation: Secondary | ICD-10-CM | POA: Insufficient documentation

## 2020-11-18 DIAGNOSIS — Z880 Allergy status to penicillin: Secondary | ICD-10-CM | POA: Insufficient documentation

## 2020-11-18 DIAGNOSIS — G4730 Sleep apnea, unspecified: Secondary | ICD-10-CM | POA: Insufficient documentation

## 2020-11-18 DIAGNOSIS — I480 Paroxysmal atrial fibrillation: Secondary | ICD-10-CM

## 2020-11-18 DIAGNOSIS — I82409 Acute embolism and thrombosis of unspecified deep veins of unspecified lower extremity: Secondary | ICD-10-CM | POA: Insufficient documentation

## 2020-11-18 DIAGNOSIS — J449 Chronic obstructive pulmonary disease, unspecified: Secondary | ICD-10-CM | POA: Insufficient documentation

## 2020-11-18 DIAGNOSIS — Z7901 Long term (current) use of anticoagulants: Secondary | ICD-10-CM | POA: Insufficient documentation

## 2020-11-18 DIAGNOSIS — Z79899 Other long term (current) drug therapy: Secondary | ICD-10-CM | POA: Insufficient documentation

## 2020-11-18 DIAGNOSIS — I2699 Other pulmonary embolism without acute cor pulmonale: Secondary | ICD-10-CM | POA: Insufficient documentation

## 2020-11-18 DIAGNOSIS — F17210 Nicotine dependence, cigarettes, uncomplicated: Secondary | ICD-10-CM | POA: Insufficient documentation

## 2020-11-18 DIAGNOSIS — I5022 Chronic systolic (congestive) heart failure: Secondary | ICD-10-CM | POA: Insufficient documentation

## 2020-11-18 DIAGNOSIS — I2782 Chronic pulmonary embolism: Secondary | ICD-10-CM

## 2020-11-18 MED ORDER — FUROSEMIDE 20 MG PO TABS: 20.0000 mg | ORAL_TABLET | Freq: Every day | ORAL | 3 refills | Status: AC

## 2020-11-18 NOTE — Progress Notes (Signed)
Patient ID: Matthew [name], male    DOB: 23-Mar-1971, 50 y.o.   MRN: [id number]  HPI  Mr Matthew [name] is a 50 y/o male with a history of atrial fibrillation, sleep apnea, PE, DVT, COPD, current tobacco/ alcohol use and chronic heart failure.   Echo report from 12/24/19 reviewed and showed an EF of 45-50% along with mild LVH/ LAE and mildly elevated PA pressure.   Was in the ED 11/09/20 due to leg edema and pain. Has also noted weeping of fluids from his legs. Given IV lasix with improvement of symptoms. Treated with antibiotic for possible beginning cellulitis and he was released.   He presents today for his initial visit with a chief complaint of minimal shortness of breath upon moderate exertion. He describes this as chronic in nature having been present for several months. He has associated decreased appetite, fatigue, pedal edema, weeping of lower legs and open wounds on bilateral shins along with this. He denies any difficulty sleeping, abdominal distention, palpitations, chest pain, dizziness or cough.   He does not have scales to weigh himself. 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   Took a 7 day course of furosemide from the ED but ran out a couple of days ago. Did feel like it helped a little bit. Reports nonhealing wounds on both shins and says that he used to follow at the wound center. Says that both legs weep fluids but right leg seems to weep more than the left.   Past Medical History:  Diagnosis Date  . Arrhythmia    atrial fibrillation  . Arthritis   . CHF (congestive heart failure) (HCC)   . Dvt femoral (deep venous thrombosis) (HCC)   . Hx of blood clots   . Pulmonary embolus (HCC)   . Sleep apnea    patient states not anymore   Past Surgical History:  Procedure Laterality Date  . IVC FILTER INSERTION Left 11/24/2018   Procedure: IVC FILTER INSERTION;   Surgeon: Annice Needy, MD;  Location: [facility] INVASIVE CV LAB;  Service: Cardiovascular;  Laterality: Left;  . IVC FILTER INSERTION Right 02/18/2019   Procedure: IVC FILTER INSERTION WITH RIGHT LOWER EXTREMITY VENOUS LYSIS;  Surgeon: Renford Dills, MD;  Location: [facility] INVASIVE CV LAB;  Service: Cardiovascular;  Laterality: Right;  . IVC FILTER REMOVAL N/A 12/01/2018   Procedure: IVC FILTER REMOVAL;  Surgeon: Annice Needy, MD;  Location: [facility] INVASIVE CV LAB;  Service: Cardiovascular;  Laterality: N/A;  . NO PAST SURGERIES    . PERIPHERAL VASCULAR THROMBECTOMY Right 10/30/2019   Procedure: PERIPHERAL VASCULAR THROMBECTOMY / THROMBOLYSIS;  Surgeon: Annice Needy, MD;  Location: [facility] INVASIVE CV LAB;  Service: Cardiovascular;  Laterality: Right;  . PERIPHERAL VASCULAR THROMBECTOMY Right 11/05/2019   Procedure: PERIPHERAL VASCULAR THROMBECTOMY / THROMBOLYSIS;  Surgeon: Annice Needy, MD;  Location: [facility] INVASIVE CV LAB;  Service: Cardiovascular;  Laterality: Right;   No family history on file. Social History   Tobacco Use  . Smoking status: Current Every Day Smoker    Packs/day: 0.50    Types: Cigarettes  . Smokeless tobacco: Current User  Substance Use Topics  . Alcohol use: Yes    Comment: weekends says every third weekend a 12 pack   Allergies  Allergen Reactions  . Amoxicillin Rash   Prior to Admission medications  Medication Sig Start Date End Date Taking? Authorizing Provider  albuterol (VENTOLIN HFA) 108 (90 Base) MCG/ACT inhaler Inhale 2 puffs into the lungs every 6 (six) hours as needed for wheezing or shortness of breath. 11/06/19  Yes Wieting, Richard, MD  rivaroxaban (XARELTO) 20 MG TABS tablet Take 1 tablet (20 mg total) by mouth daily with supper. 10/12/20  Yes Danford, Earl Lites, MD  furosemide (LASIX) 20 MG tablet Take 1 tablet (20 mg total) by mouth daily for 7 days. Patient not taking: Reported on 11/18/2020 11/09/20 11/16/20  Chesley Noon, MD    Review of Systems   Constitutional: Positive for appetite change (decreased) and fatigue.  HENT: Negative for congestion, postnasal drip and sore throat.   Eyes: Negative.   Respiratory: Positive for shortness of breath (with moderate exertion). Negative for cough and chest tightness.   Cardiovascular: Positive for leg swelling. Negative for chest pain and palpitations.  Gastrointestinal: Negative for abdominal distention and abdominal pain.  Endocrine: Negative.   Genitourinary: Negative.   Musculoskeletal: Positive for arthralgias (left sided rib pain). Negative for back pain.  Skin: Positive for wound (open wounds on both shins).  Allergic/Immunologic: Negative.   Neurological: Negative for dizziness and light-headedness.  Hematological: Negative for adenopathy. Does not bruise/bleed easily.  Psychiatric/Behavioral: Negative for dysphoric mood and sleep disturbance (wearing CPAP nightly). The patient is not nervous/anxious.     Vitals:   11/18/20 1322  BP: 129/76  Pulse: 96  Resp: 20  SpO2: 90%  Weight: 287 lb 4 oz (130.3 kg)  Height: 5\' 7"  (1.702 m)   Wt Readings from Last 3 Encounters:  11/18/20 287 lb 4 oz (130.3 kg)  11/09/20 (!) 301 lb 3.2 oz (136.6 kg)  09/19/20 290 lb (131.5 kg)   Lab Results  Component Value Date   CREATININE 0.73 11/09/2020   CREATININE 0.78 09/21/2020   CREATININE 0.70 09/19/2020    Physical Exam Vitals and nursing note reviewed.  Constitutional:      Appearance: He is well-developed.  HENT:     Head: Normocephalic and atraumatic.  Neck:     Vascular: No JVD.  Cardiovascular:     Rate and Rhythm: Normal rate and regular rhythm.  Pulmonary:     Effort: Pulmonary effort is normal.     Breath sounds: Examination of the right-lower field reveals wheezing. Examination of the left-lower field reveals wheezing. Wheezing present. No rales.  Abdominal:     Palpations: Abdomen is soft.     Tenderness: There is no abdominal tenderness.  Musculoskeletal:      Cervical back: Neck supple.     Right lower leg: Edema (2+ pitting; clear fluids weeping) present.     Left lower leg: Edema (2+ pitting) present.  Skin:    General: Skin is warm and dry.     Findings: Wound (~ 2 inch open wound right anterior shin;  2 smaller open wounds left anterior shin) present.  Neurological:     General: No focal deficit present.     Mental Status: He is alert and oriented to person, place, and time.  Psychiatric:        Mood and Affect: Mood is anxious.        Behavior: Behavior normal.    Assessment & Plan:  1: Chronic heart failure with mildly reduced ejection fraction- - NYHA class II - euvolemic today - scales given to him today and instructed him to weigh himself daily, write the weight down and call for an overnight weight  gain of > 2 pounds or a weekly weight gain >5 pounds - denies adding salt but says that during his workday, he may just eat a bag of chips or pack of nabs; reviewed watching sodium intact and written dietary information was provided to him - will resume furosemide 20mg  daily and RX sent to medication management clinic - check labs next week - would like to add GDMT of entresto, carvedilol, spironolactone and SGLT2 - he mentions that he doesn't like to take much time off from work - BNP 09/20/20 was 45.4  2: Atrial fibrillation- - regular rhythm today - currently without insurance so doesn't see cardiology  3: COPD/ tobacco use- - saw PCP @ [facility] 03/30/20; says that he's planning on establishing care at [facility] 11/09/20 reviewed and showed sodium 140, potassium 4.8, creatinine 0.73 and GFR >60 - smoking ~ 1/2 ppd of cigarettes - drinks ~ 40 ounces of beer daily; says that he's "cut down a lot"  4: PE/DVT- - saw vascular 01/09/21) 02/12/20 - currently taking rivaroxaban 20mg  daily  5: Lymphedema- - stage 2 - not wearing compression socks as legs are currently weeping - admits to not  elevating his legs much; is unable to at work and gets busy at home so doesn't elevate them at home - encouraged him to try and elevate them for even 15 minutes when he gets home from work - appt made at vascular to have his open wounds assessed; appt was scheduled on 11/22/20 on the same day he comes back to   Patient did not bring his medications nor a list. Each medication was verbally reviewed with the patient and he was encouraged to bring the bottles to every visit to confirm accuracy of list.  Return in 1 week or sooner for any questions/problems before then.

## 2020-11-18 NOTE — Patient Instructions (Addendum)
Begin weighing daily and call for an overnight weight gain of > 2 pounds or a weekly weight gain of >5 pounds.   Take your lasix (furosemide) as 1 tablet daily.

## 2020-11-20 NOTE — Progress Notes (Signed)
Patient ID: Matthew [name], male    DOB: 13-May-1971, 50 y.o.   MRN: [id number]  HPI  Matthew [name] is a 50 y/o male with a history of atrial fibrillation, sleep apnea, PE, DVT, COPD, current tobacco/ alcohol use and chronic heart failure.   Echo report from 12/24/19 reviewed and showed an EF of 45-50% along with mild LVH/ LAE and mildly elevated PA pressure.   Was in the ED 11/09/20 due to leg edema and pain. Has also noted weeping of fluids from his legs. Given IV lasix with improvement of symptoms. Treated with antibiotic for possible beginning cellulitis and he was released.   He presents today for a follow-up visit with a chief complaint of minimal shortness of breath upon moderate exertion. He describes this as chronic as having been present for several months. He has associated fatigue, pedal edema and hematuria along with this. He denies any dizziness, palpitations, chest pain, cough, abdominal distention or weight gain.   Tolerating the furosemide without known side effects and says that his legs are still swollen. He also mentions that he's had a slight blood tinge to his urine that is occurring on an intermittent basis.   Past Medical History:  Diagnosis Date  . Arrhythmia    atrial fibrillation  . Arthritis   . CHF (congestive heart failure) (HCC)   . COPD (chronic obstructive pulmonary disease) (HCC)   . Dvt femoral (deep venous thrombosis) (HCC)   . Hx of blood clots   . Pulmonary embolus (HCC)   . Sleep apnea    patient states not anymore   Past Surgical History:  Procedure Laterality Date  . IVC FILTER INSERTION Left 11/24/2018   Procedure: IVC FILTER INSERTION;  Surgeon: Annice Needy, MD;  Location: [facility];  Service: Cardiovascular;  Laterality: Left;  . IVC FILTER INSERTION Right 02/18/2019   Procedure: IVC FILTER INSERTION WITH RIGHT LOWER EXTREMITY VENOUS LYSIS;  Surgeon: Renford Dills, MD;  Location: [facility];  Service: Cardiovascular;   Laterality: Right;  . IVC FILTER REMOVAL N/A 12/01/2018   Procedure: IVC FILTER REMOVAL;  Surgeon: Annice Needy, MD;  Location: [facility];  Service: Cardiovascular;  Laterality: N/A;  . NO PAST SURGERIES    . PERIPHERAL VASCULAR THROMBECTOMY Right 10/30/2019   Procedure: PERIPHERAL VASCULAR THROMBECTOMY / THROMBOLYSIS;  Surgeon: Annice Needy, MD;  Location: [facility];  Service: Cardiovascular;  Laterality: Right;  . PERIPHERAL VASCULAR THROMBECTOMY Right 11/05/2019   Procedure: PERIPHERAL VASCULAR THROMBECTOMY / THROMBOLYSIS;  Surgeon: Annice Needy, MD;  Location: [facility];  Service: Cardiovascular;  Laterality: Right;   No family history on file. Social History   Tobacco Use  . Smoking status: Current Every Day Smoker    Packs/day: 0.50    Types: Cigarettes  . Smokeless tobacco: Current User  Substance Use Topics  . Alcohol use: Yes    Comment: weekends says every third weekend a 12 pack   Allergies  Allergen Reactions  . Amoxicillin Rash   Prior to Admission medications   Medication Sig Start Date End Date Taking? Authorizing Provider  albuterol (VENTOLIN HFA) 108 (90 Base) MCG/ACT inhaler Inhale 2 puffs into the lungs every 6 (six) hours as needed for wheezing or shortness of breath. 11/06/19  Yes Wieting, Richard, MD  furosemide (LASIX) 20 MG tablet Take 1 tablet (20 mg total) by mouth daily. 11/18/20 02/16/21 Yes Delma Freeze, FNP  rivaroxaban (XARELTO) 20  MG TABS tablet Take 1 tablet (20 mg total) by mouth daily with supper. 10/12/20  Yes Danford, Earl Lites, MD    Review of Systems  Constitutional: Positive for appetite change (decreased) and fatigue.  HENT: Negative for congestion, postnasal drip and sore throat.   Eyes: Negative.   Respiratory: Positive for shortness of breath (with moderate exertion). Negative for cough and chest tightness.   Cardiovascular: Positive for leg swelling. Negative for chest pain and palpitations.   Gastrointestinal: Negative for abdominal distention and abdominal pain.  Endocrine: Negative.   Genitourinary: Positive for hematuria (at times).  Musculoskeletal: Positive for arthralgias (left sided rib pain). Negative for back pain.  Skin: Positive for wound (open wounds on both shins).  Allergic/Immunologic: Negative.   Neurological: Negative for dizziness and light-headedness.  Hematological: Negative for adenopathy. Does not bruise/bleed easily.  Psychiatric/Behavioral: Negative for dysphoric mood and sleep disturbance (wearing CPAP nightly). The patient is not nervous/anxious.    Vitals:   11/22/20 1422  BP: 119/71  Pulse: 97  Resp: 20  SpO2: 91%  Weight: 285 lb 4 oz (129.4 kg)  Height: 5\' 7"  (1.702 m)   Wt Readings from Last 3 Encounters:  11/22/20 285 lb (129.3 kg)  11/22/20 285 lb 4 oz (129.4 kg)  11/18/20 287 lb 4 oz (130.3 kg)   Lab Results  Component Value Date   CREATININE 0.73 11/09/2020   CREATININE 0.78 09/21/2020   CREATININE 0.70 09/19/2020   Physical Exam Vitals and nursing note reviewed.  Constitutional:      Appearance: He is well-developed.  HENT:     Head: Normocephalic and atraumatic.  Neck:     Vascular: No JVD.  Cardiovascular:     Rate and Rhythm: Normal rate and regular rhythm.  Pulmonary:     Effort: Pulmonary effort is normal.     Breath sounds: No wheezing or rales.  Abdominal:     Palpations: Abdomen is soft.     Tenderness: There is no abdominal tenderness.  Musculoskeletal:     Cervical back: Neck supple.     Right lower leg: Edema (2+ pitting; clear fluids weeping) present.     Left lower leg: Edema (2+ pitting) present.  Skin:    General: Skin is warm and dry.     Findings: Wound (~ 2 inch open wound right anterior shin;  2 smaller open wounds left anterior shin) present.  Neurological:     General: No focal deficit present.     Mental Status: He is alert and oriented to person, place, and time.  Psychiatric:        Mood  and Affect: Mood is anxious.        Behavior: Behavior normal.    Assessment & Plan:  1: Chronic heart failure with mildly reduced ejection fraction- - NYHA class II - euvolemic today - weighing daily; reminded to call for an overnight weight gain of > 2 pounds or a weekly weight gain >5 pounds - weight down 2 pounds from last visit here 1 week ago - denies adding salt but says that during his workday, he may just eat a bag of chips or pack of nabs - furosemide 20mg  resumed at last visit - would like to add GDMT of entresto, carvedilol, spironolactone and SGLT2; this was discussed but patient arrived to his appointment 20  minutes late & needs to get to vascular appointment - he mentions that he doesn't like to take much time off from work - BNP 09/20/20 was  45.4  2: Atrial fibrillation- - regular rhythm today - currently without insurance so doesn't see cardiology  3: COPD/ tobacco use- - saw PCP @ [facility] 03/30/20; says that he's planning on establishing care at [facility] 11/09/20 reviewed and showed sodium 140, potassium 4.8, creatinine 0.73 and GFR >60 - smoking ~ 1/2 ppd of cigarettes - drinks ~ 40 ounces of beer daily; says that he's "cut down a lot"  4: PE/DVT- - saw vascular Manson Passey) 02/12/20 - currently taking rivaroxaban 20mg  daily  5: Lymphedema- - stage 2 - not wearing compression socks as legs are currently weeping - admits to not elevating his legs much; is unable to at work and gets busy at home so doesn't elevate them at home - encouraged him to try and elevate them for even 15 minutes when he gets home from work - seeing vascular today for probable UNNA boots  6: Hematuria- - patient mentions intermittent light-blood tinged urine - denies dysuria or fevers - currently on blood thinner due to PE/DVT - emphasized that he needed to call [facility] or present to Urgent Care to have urine checked to rule out UTI -  patient verbalized understanding   Patient did not bring his medications nor a list. Each medication was verbally reviewed with the patient and he was encouraged to bring the bottles to every visit to confirm accuracy of list.  Return will be pending on vascular appointments. Will try to coordinate visits so that he doesn't have to miss as much work.

## 2020-11-22 ENCOUNTER — Other Ambulatory Visit: Payer: Self-pay

## 2020-11-22 ENCOUNTER — Encounter: Payer: Self-pay | Admitting: Family

## 2020-11-22 ENCOUNTER — Ambulatory Visit (INDEPENDENT_AMBULATORY_CARE_PROVIDER_SITE_OTHER): Payer: Self-pay | Admitting: Nurse Practitioner

## 2020-11-22 ENCOUNTER — Ambulatory Visit: Payer: Self-pay | Attending: Family | Admitting: Family

## 2020-11-22 VITALS — BP 119/71 | HR 97 | Resp 20 | Ht 67.0 in | Wt 285.2 lb

## 2020-11-22 VITALS — BP 117/79 | HR 89 | Resp 16 | Ht 67.0 in | Wt 285.0 lb

## 2020-11-22 DIAGNOSIS — Z7901 Long term (current) use of anticoagulants: Secondary | ICD-10-CM | POA: Insufficient documentation

## 2020-11-22 DIAGNOSIS — I83009 Varicose veins of unspecified lower extremity with ulcer of unspecified site: Secondary | ICD-10-CM

## 2020-11-22 DIAGNOSIS — R319 Hematuria, unspecified: Secondary | ICD-10-CM | POA: Insufficient documentation

## 2020-11-22 DIAGNOSIS — I4891 Unspecified atrial fibrillation: Secondary | ICD-10-CM | POA: Insufficient documentation

## 2020-11-22 DIAGNOSIS — I2782 Chronic pulmonary embolism: Secondary | ICD-10-CM

## 2020-11-22 DIAGNOSIS — I5022 Chronic systolic (congestive) heart failure: Secondary | ICD-10-CM

## 2020-11-22 DIAGNOSIS — I509 Heart failure, unspecified: Secondary | ICD-10-CM | POA: Insufficient documentation

## 2020-11-22 DIAGNOSIS — Z86711 Personal history of pulmonary embolism: Secondary | ICD-10-CM | POA: Insufficient documentation

## 2020-11-22 DIAGNOSIS — I480 Paroxysmal atrial fibrillation: Secondary | ICD-10-CM

## 2020-11-22 DIAGNOSIS — I890 Lymphedema, not elsewhere classified: Secondary | ICD-10-CM | POA: Insufficient documentation

## 2020-11-22 DIAGNOSIS — G4730 Sleep apnea, unspecified: Secondary | ICD-10-CM | POA: Insufficient documentation

## 2020-11-22 DIAGNOSIS — L97909 Non-pressure chronic ulcer of unspecified part of unspecified lower leg with unspecified severity: Secondary | ICD-10-CM

## 2020-11-22 DIAGNOSIS — F17210 Nicotine dependence, cigarettes, uncomplicated: Secondary | ICD-10-CM | POA: Insufficient documentation

## 2020-11-22 DIAGNOSIS — Z86718 Personal history of other venous thrombosis and embolism: Secondary | ICD-10-CM | POA: Insufficient documentation

## 2020-11-22 DIAGNOSIS — J449 Chronic obstructive pulmonary disease, unspecified: Secondary | ICD-10-CM | POA: Insufficient documentation

## 2020-11-22 NOTE — Patient Instructions (Addendum)
Continue weighing daily and call for an overnight weight gain of > 2 pounds or a weekly weight gain of >5 pounds.   Go to Urgent Care if needed to have urine checked for possible urinary tract infection.    Depending on health status, may need to reduce work hours.

## 2020-11-22 NOTE — Progress Notes (Signed)
History of Present Illness  There is no documented history at this time  Assessments & Plan   There are no diagnoses linked to this encounter.    Additional instructions  Subjective:  Patient presents with venous ulcer of the Bilateral lower extremity.    Procedure:  3 layer unna wrap was placed Bilateral lower extremity.   Plan:   Follow up in one week.  

## 2020-11-25 ENCOUNTER — Emergency Department: Payer: Self-pay

## 2020-11-25 ENCOUNTER — Inpatient Hospital Stay
Admission: EM | Admit: 2020-11-25 | Discharge: 2020-11-27 | DRG: 872 | Disposition: A | Discharge status: Home / Self Care | Payer: Self-pay | Attending: Internal Medicine | Admitting: Internal Medicine

## 2020-11-25 ENCOUNTER — Other Ambulatory Visit: Payer: Self-pay

## 2020-11-25 DIAGNOSIS — K760 Fatty (change of) liver, not elsewhere classified: Secondary | ICD-10-CM | POA: Diagnosis present

## 2020-11-25 DIAGNOSIS — I2699 Other pulmonary embolism without acute cor pulmonale: Secondary | ICD-10-CM | POA: Diagnosis present

## 2020-11-25 DIAGNOSIS — N390 Urinary tract infection, site not specified: Secondary | ICD-10-CM | POA: Diagnosis present

## 2020-11-25 DIAGNOSIS — Z79899 Other long term (current) drug therapy: Secondary | ICD-10-CM

## 2020-11-25 DIAGNOSIS — Z86718 Personal history of other venous thrombosis and embolism: Secondary | ICD-10-CM

## 2020-11-25 DIAGNOSIS — Z20822 Contact with and (suspected) exposure to covid-19: Secondary | ICD-10-CM | POA: Diagnosis present

## 2020-11-25 DIAGNOSIS — I959 Hypotension, unspecified: Secondary | ICD-10-CM

## 2020-11-25 DIAGNOSIS — Z720 Tobacco use: Secondary | ICD-10-CM | POA: Diagnosis present

## 2020-11-25 DIAGNOSIS — Z8669 Personal history of other diseases of the nervous system and sense organs: Secondary | ICD-10-CM

## 2020-11-25 DIAGNOSIS — Z86711 Personal history of pulmonary embolism: Secondary | ICD-10-CM

## 2020-11-25 DIAGNOSIS — I872 Venous insufficiency (chronic) (peripheral): Secondary | ICD-10-CM | POA: Diagnosis present

## 2020-11-25 DIAGNOSIS — I272 Pulmonary hypertension, unspecified: Secondary | ICD-10-CM | POA: Diagnosis present

## 2020-11-25 DIAGNOSIS — Z7901 Long term (current) use of anticoagulants: Secondary | ICD-10-CM

## 2020-11-25 DIAGNOSIS — Z6841 Body Mass Index (BMI) 40.0 and over, adult: Secondary | ICD-10-CM

## 2020-11-25 DIAGNOSIS — R600 Localized edema: Secondary | ICD-10-CM | POA: Diagnosis present

## 2020-11-25 DIAGNOSIS — G4733 Obstructive sleep apnea (adult) (pediatric): Secondary | ICD-10-CM | POA: Diagnosis present

## 2020-11-25 DIAGNOSIS — F17210 Nicotine dependence, cigarettes, uncomplicated: Secondary | ICD-10-CM | POA: Diagnosis present

## 2020-11-25 DIAGNOSIS — J449 Chronic obstructive pulmonary disease, unspecified: Secondary | ICD-10-CM | POA: Diagnosis present

## 2020-11-25 DIAGNOSIS — I509 Heart failure, unspecified: Secondary | ICD-10-CM | POA: Diagnosis present

## 2020-11-25 DIAGNOSIS — I110 Hypertensive heart disease with heart failure: Secondary | ICD-10-CM | POA: Diagnosis present

## 2020-11-25 DIAGNOSIS — L03115 Cellulitis of right lower limb: Secondary | ICD-10-CM | POA: Diagnosis present

## 2020-11-25 DIAGNOSIS — Z9114 Patient's other noncompliance with medication regimen: Secondary | ICD-10-CM

## 2020-11-25 DIAGNOSIS — Z880 Allergy status to penicillin: Secondary | ICD-10-CM

## 2020-11-25 DIAGNOSIS — R319 Hematuria, unspecified: Secondary | ICD-10-CM | POA: Diagnosis present

## 2020-11-25 DIAGNOSIS — L03116 Cellulitis of left lower limb: Secondary | ICD-10-CM | POA: Diagnosis present

## 2020-11-25 DIAGNOSIS — I82411 Acute embolism and thrombosis of right femoral vein: Secondary | ICD-10-CM | POA: Diagnosis present

## 2020-11-25 DIAGNOSIS — A419 Sepsis, unspecified organism: Principal | ICD-10-CM | POA: Diagnosis present

## 2020-11-25 DIAGNOSIS — R451 Restlessness and agitation: Secondary | ICD-10-CM | POA: Diagnosis present

## 2020-11-25 DIAGNOSIS — I480 Paroxysmal atrial fibrillation: Secondary | ICD-10-CM | POA: Diagnosis present

## 2020-11-25 DIAGNOSIS — F1010 Alcohol abuse, uncomplicated: Secondary | ICD-10-CM | POA: Diagnosis present

## 2020-11-25 DIAGNOSIS — I4891 Unspecified atrial fibrillation: Secondary | ICD-10-CM | POA: Diagnosis present

## 2020-11-25 DIAGNOSIS — Z87440 Personal history of urinary (tract) infections: Secondary | ICD-10-CM

## 2020-11-25 DIAGNOSIS — R7303 Prediabetes: Secondary | ICD-10-CM | POA: Diagnosis present

## 2020-11-25 DIAGNOSIS — K8020 Calculus of gallbladder without cholecystitis without obstruction: Secondary | ICD-10-CM | POA: Diagnosis present

## 2020-11-25 LAB — URINALYSIS, COMPLETE (UACMP) WITH MICROSCOPIC
Bacteria, UA: NONE SEEN
Bilirubin Urine: NEGATIVE
Glucose, UA: NEGATIVE mg/dL
Ketones, ur: NEGATIVE mg/dL
Nitrite: NEGATIVE
Protein, ur: 30 mg/dL — AB
RBC / HPF: >50 RBC/hpf — ABNORMAL HIGH (ref 0–5)
Specific Gravity, Urine: 1.03 (ref 1.005–1.030)
WBC, UA: >50 WBC/hpf — ABNORMAL HIGH (ref 0–5)
pH: 5 (ref 5.0–8.0)

## 2020-11-25 LAB — CBC WITH DIFFERENTIAL/PLATELET
Abs Immature Granulocytes: 0.08 10*3/uL — ABNORMAL HIGH (ref 0.00–0.07)
Basophils Absolute: 0.1 10*3/uL (ref 0.0–0.1)
Basophils Relative: 0 %
Eosinophils Absolute: 0.1 10*3/uL (ref 0.0–0.5)
Eosinophils Relative: 1 %
HCT: 47.4 % (ref 39.0–52.0)
Hemoglobin: 15.6 g/dL (ref 13.0–17.0)
Immature Granulocytes: 1 %
Lymphocytes Relative: 12 %
Lymphs Abs: 1.6 10*3/uL (ref 0.7–4.0)
MCH: 32.5 pg (ref 26.0–34.0)
MCHC: 32.9 g/dL (ref 30.0–36.0)
MCV: 98.8 fL (ref 80.0–100.0)
Monocytes Absolute: 1.3 10*3/uL — ABNORMAL HIGH (ref 0.1–1.0)
Monocytes Relative: 10 %
Neutro Abs: 10.3 10*3/uL — ABNORMAL HIGH (ref 1.7–7.7)
Neutrophils Relative %: 76 %
Platelets: 152 10*3/uL (ref 150–400)
RBC: 4.8 MIL/uL (ref 4.22–5.81)
RDW: 14.7 % (ref 11.5–15.5)
WBC: 13.4 10*3/uL — ABNORMAL HIGH (ref 4.0–10.5)
nRBC: 0 % (ref 0.0–0.2)

## 2020-11-25 LAB — GLUCOSE, CAPILLARY: Glucose-Capillary: 99 mg/dL (ref 70–99)

## 2020-11-25 LAB — COMPREHENSIVE METABOLIC PANEL
ALT: 15 U/L (ref 0–44)
AST: 30 U/L (ref 15–41)
Albumin: 2.8 g/dL — ABNORMAL LOW (ref 3.5–5.0)
Alkaline Phosphatase: 145 U/L — ABNORMAL HIGH (ref 38–126)
Anion gap: 9 (ref 5–15)
BUN: 16 mg/dL (ref 6–20)
CO2: 26 mmol/L (ref 22–32)
Calcium: 8 mg/dL — ABNORMAL LOW (ref 8.9–10.3)
Chloride: 99 mmol/L (ref 98–111)
Creatinine, Ser: 1.04 mg/dL (ref 0.61–1.24)
GFR, Estimated: >60 mL/min (ref >60)
Glucose, Bld: 97 mg/dL (ref 70–99)
Potassium: 3.9 mmol/L (ref 3.5–5.1)
Sodium: 134 mmol/L — ABNORMAL LOW (ref 135–145)
Total Bilirubin: 4.1 mg/dL — ABNORMAL HIGH (ref 0.3–1.2)
Total Protein: 7 g/dL (ref 6.5–8.1)

## 2020-11-25 LAB — RESP PANEL BY RT-PCR (FLU A&B, COVID) ARPGX2
Influenza A by PCR: NEGATIVE
Influenza B by PCR: NEGATIVE
SARS Coronavirus 2 by RT PCR: NEGATIVE

## 2020-11-25 LAB — PROTIME-INR
INR: 1.3 — ABNORMAL HIGH (ref 0.8–1.2)
Prothrombin Time: 15.4 seconds — ABNORMAL HIGH (ref 11.4–15.2)

## 2020-11-25 LAB — APTT: aPTT: 30 seconds (ref 24–36)

## 2020-11-25 LAB — LACTIC ACID, PLASMA: Lactic Acid, Venous: 1 mmol/L (ref 0.5–1.9)

## 2020-11-25 LAB — MRSA PCR SCREENING: MRSA by PCR: NEGATIVE

## 2020-11-25 LAB — TSH: TSH: 4.917 u[IU]/mL — ABNORMAL HIGH (ref 0.350–4.500)

## 2020-11-25 MED ORDER — ALBUTEROL SULFATE HFA 108 (90 BASE) MCG/ACT IN AERS
2.0000 | INHALATION_SPRAY | Freq: Four times a day (QID) | RESPIRATORY_TRACT | Status: DC | PRN
  Filled 2020-11-25: qty 6.7

## 2020-11-25 MED ORDER — RIVAROXABAN 20 MG PO TABS
20.0000 mg | ORAL_TABLET | Freq: Every day | ORAL | Status: DC
  Administered 2020-11-26: 20 mg via ORAL
  Filled 2020-11-25 (×3): qty 1

## 2020-11-25 MED ORDER — LORAZEPAM 2 MG/ML IJ SOLN
1.0000 mg | INTRAMUSCULAR | Status: DC | PRN
Start: 2020-11-25 — End: 2020-11-27

## 2020-11-25 MED ORDER — IOHEXOL 350 MG/ML SOLN
100.0000 mL | Freq: Once | INTRAVENOUS | Status: AC | PRN
  Administered 2020-11-25: 100 mL via INTRAVENOUS

## 2020-11-25 MED ORDER — VANCOMYCIN HCL 1000 MG/200ML IV SOLN
1000.0000 mg | Freq: Two times a day (BID) | INTRAVENOUS | Status: DC
  Filled 2020-11-25: qty 200

## 2020-11-25 MED ORDER — MIDODRINE HCL 5 MG PO TABS: 5.0000 mg | ORAL_TABLET | Freq: Three times a day (TID) | ORAL | Status: DC | PRN

## 2020-11-25 MED ORDER — SODIUM CHLORIDE 0.9 % IV SOLN
2.0000 g | Freq: Three times a day (TID) | INTRAVENOUS | Status: DC
  Administered 2020-11-25 – 2020-11-26 (×2): 2 g via INTRAVENOUS
  Filled 2020-11-25 (×5): qty 2

## 2020-11-25 MED ORDER — ACETAMINOPHEN 650 MG RE SUPP
325.0000 mg | Freq: Four times a day (QID) | RECTAL | Status: DC | PRN
Start: 2020-11-25 — End: 2020-11-27

## 2020-11-25 MED ORDER — VANCOMYCIN HCL IN DEXTROSE 1-5 GM/200ML-% IV SOLN: 1000.0000 mg | Freq: Once | INTRAVENOUS | Status: DC

## 2020-11-25 MED ORDER — FOLIC ACID 1 MG PO TABS
1.0000 mg | ORAL_TABLET | Freq: Every day | ORAL | Status: DC
  Administered 2020-11-25 – 2020-11-27 (×3): 1 mg via ORAL
  Filled 2020-11-25 (×3): qty 1

## 2020-11-25 MED ORDER — VANCOMYCIN HCL 2000 MG/400ML IV SOLN
2000.0000 mg | Freq: Once | INTRAVENOUS | Status: DC
  Administered 2020-11-25: 2000 mg via INTRAVENOUS
  Filled 2020-11-25: qty 400

## 2020-11-25 MED ORDER — LORAZEPAM 1 MG PO TABS: 1.0000 mg | ORAL_TABLET | ORAL | Status: DC | PRN

## 2020-11-25 MED ORDER — ONDANSETRON HCL 4 MG PO TABS
4.0000 mg | ORAL_TABLET | Freq: Four times a day (QID) | ORAL | Status: DC | PRN
Start: 2020-11-25 — End: 2020-11-27

## 2020-11-25 MED ORDER — THIAMINE HCL 100 MG PO TABS
100.0000 mg | ORAL_TABLET | Freq: Every day | ORAL | Status: DC
  Administered 2020-11-25 – 2020-11-27 (×3): 100 mg via ORAL
  Filled 2020-11-25 (×3): qty 1

## 2020-11-25 MED ORDER — SODIUM CHLORIDE 0.9 % IV BOLUS
1000.0000 mL | Freq: Once | INTRAVENOUS | Status: AC
  Administered 2020-11-25: 1000 mL via INTRAVENOUS

## 2020-11-25 MED ORDER — LACTATED RINGERS IV SOLN: INTRAVENOUS | Status: DC

## 2020-11-25 MED ORDER — ADULT MULTIVITAMIN W/MINERALS CH
1.0000 | ORAL_TABLET | Freq: Every day | ORAL | Status: DC
  Administered 2020-11-25: 1 via ORAL
  Filled 2020-11-25 (×3): qty 1

## 2020-11-25 MED ORDER — ACETAMINOPHEN 325 MG PO TABS
325.0000 mg | ORAL_TABLET | Freq: Four times a day (QID) | ORAL | Status: DC | PRN
  Administered 2020-11-26: 325 mg via ORAL
  Filled 2020-11-25: qty 1

## 2020-11-25 MED ORDER — METRONIDAZOLE IN NACL 5-0.79 MG/ML-% IV SOLN
500.0000 mg | Freq: Once | INTRAVENOUS | Status: AC
  Administered 2020-11-25: 500 mg via INTRAVENOUS
  Filled 2020-11-25: qty 100

## 2020-11-25 MED ORDER — THIAMINE HCL 100 MG/ML IJ SOLN
100.0000 mg | Freq: Every day | INTRAMUSCULAR | Status: DC
  Filled 2020-11-25: qty 2

## 2020-11-25 MED ORDER — LACTATED RINGERS IV SOLN: INTRAVENOUS | Status: AC

## 2020-11-25 MED ORDER — SODIUM CHLORIDE 0.9 % IV SOLN
2.0000 g | Freq: Once | INTRAVENOUS | Status: AC
  Administered 2020-11-25: 2 g via INTRAVENOUS
  Filled 2020-11-25: qty 2

## 2020-11-25 MED ORDER — ACETAMINOPHEN 500 MG PO TABS
1000.0000 mg | ORAL_TABLET | Freq: Once | ORAL | Status: AC
  Administered 2020-11-25: 1000 mg via ORAL
  Filled 2020-11-25: qty 2

## 2020-11-25 MED ORDER — ONDANSETRON HCL 4 MG/2ML IJ SOLN: 4.0000 mg | Freq: Four times a day (QID) | INTRAMUSCULAR | Status: DC | PRN

## 2020-11-25 MED ORDER — KETOROLAC TROMETHAMINE 30 MG/ML IJ SOLN
30.0000 mg | Freq: Four times a day (QID) | INTRAMUSCULAR | Status: DC | PRN
  Administered 2020-11-25 – 2020-11-27 (×5): 30 mg via INTRAVENOUS
  Filled 2020-11-25 (×5): qty 1

## 2020-11-25 NOTE — Consult Note (Signed)
Pharmacy Antibiotic Note  Matthew [name] is a 50 y.o. male admitted on 11/25/2020 with sepsis. Pt presented with hypotension and hematuria. PMH includes afib, DVTs, and PEs on Eliquis. Pharmacy has been consulted for vancomycin and cefepime dosing.  3/25 CT abdomen: Cholelithiasis with a 1.9 cm Kaytlyn Din mobile within the gallbladder. No sign of cholecystitis or obstruction.  Scr 1.04 (baseline ~0.6)  WBC 13.4, Tmax 101.8  Day 1 abx  Plan: --Vanc 2g x 1 loading dose given. Ordered vancomycin 1000 mg IV q12h ----Estimated AUC 439.8, Cmin 11.7 --Cefepime 2g IV x 1 dose given, will schedule cefepime 2g IV q8h  --Monitor renal function daily  Height: 5\' 7"  (170.2 cm) Weight: 129.3 kg (285 lb) IBW/kg (Calculated) : 66.1  Temp (24hrs), Avg:100.4 F (38 C), Min:98.9 F (37.2 C), Max:101.8 F (38.8 C)  Recent Labs  Lab 11/25/20 1423  WBC 13.4*  CREATININE 1.04  LATICACIDVEN 1.0    Estimated Creatinine Clearance: 109.9 mL/min (by C-G formula based on SCr of 1.04 mg/dL).    Allergies  Allergen Reactions  . Amoxicillin Rash    Antimicrobials this admission: 3/25 vancomycin >> 3/25 cefepime >>  Microbiology results: 3/25 BCx: pending 3/25 UCx: pending 3/25 MRSA PCR: pending  Thank you for allowing pharmacy to be a part of this patient's care.  4/25, PharmD Pharmacy Resident  11/25/2020 8:45 PM

## 2020-11-25 NOTE — Progress Notes (Signed)
PHARMACY -  BRIEF ANTIBIOTIC NOTE   Pharmacy has received consult(s) for vancomycin and cefepime from an ED provider.  The patient's profile has been reviewed for ht/wt/allergies/indication/available labs.    One time order(s) placed for   1) vancomycin 2000 mg IV x 1  2) cefepime 2 grams IV x 1  Further antibiotics/pharmacy consults should be ordered by admitting physician if indicated.                       Thank you, Lowella Bandy 11/25/2020  2:17 PM

## 2020-11-25 NOTE — ED Provider Notes (Addendum)
[facility] Emergency Department Provider Note  ____________________________________________   Event Date/Time   First MD Initiated Contact with Patient 11/25/20 1349     (approximate)  I have reviewed the triage vital signs and the nursing notes.   HISTORY  Chief Complaint Hypotension and Hematuria    HPI Matthew [name] is a 50 y.o. male with A. fib, DVTs, PEs on Eliquis who comes in for fevers and difficulty urinating and blood in his urine.  Patient has not been compliant with his Eliquis medication.  He was found to be hypoxic and placed on 3 L by EMS.  He also states that he has had difficulty urinating and some blood in his urine this morning for the past few days, constant, nothing makes better, nothing makes it worse.  Denies a history of kidney stones.  Has had a history of UTIs but never has had blood in his urine before.          Past Medical History:  Diagnosis Date  . Arrhythmia    atrial fibrillation  . Arthritis   . CHF (congestive heart failure) (HCC)   . COPD (chronic obstructive pulmonary disease) (HCC)   . Dvt femoral (deep venous thrombosis) (HCC)   . Hx of blood clots   . Pulmonary embolus (HCC)   . Sleep apnea    patient states not anymore    Patient Active Problem List   Diagnosis Date Noted  . Chronic systolic CHF (congestive heart failure) (HCC) 09/20/2020  . Elevated troponin 09/20/2020  . Wound of right leg, initial encounter 09/20/2020  . COPD exacerbation (HCC)   . Bilateral edema of lower extremity 03/30/2020  . History of sleep apnea 03/30/2020  . Hospital discharge follow-up 01/07/2020  . Shortness of breath 12/22/2019  . Pulmonary embolism (HCC) 12/22/2019  . Atrial fibrillation with RVR (HCC) 12/22/2019  . Acute pulmonary embolism (HCC) 12/22/2019  . Non compliance w medication regimen 12/22/2019  . COPD (chronic obstructive pulmonary disease) (HCC) 12/19/2019  . Sepsis (HCC) 12/19/2019  . Community  acquired pneumonia 12/19/2019  . Acute on chronic respiratory failure with hypoxia (HCC) 12/19/2019  . OSA (obstructive sleep apnea) 12/19/2019  . Venous ulcer (HCC) 12/09/2019  . COPD with acute exacerbation (HCC)   . Class 3 severe obesity without serious comorbidity with body mass index (BMI) of 40.0 to 44.9 in adult [facility])   . Other cirrhosis of liver (HCC)   . Hypomagnesemia   . Tobacco abuse 10/29/2019  . Prediabetes 04/25/2019  . Wheeze 04/25/2019  . Ingrown toenail of right foot 04/23/2019  . AF (paroxysmal atrial fibrillation) (HCC) 03/19/2019  . DVT (deep venous thrombosis) (HCC) 03/19/2019  . Bilateral pneumonia 03/11/2019  . Acute DVT (deep venous thrombosis) (HCC) 02/17/2019  . DVT of deep femoral vein, right (HCC) 11/19/2018    Past Surgical History:  Procedure Laterality Date  . IVC FILTER INSERTION Left 11/24/2018   Procedure: IVC FILTER INSERTION;  Surgeon: Annice Needy, MD;  Location: [facility] INVASIVE CV LAB;  Service: Cardiovascular;  Laterality: Left;  . IVC FILTER INSERTION Right 02/18/2019   Procedure: IVC FILTER INSERTION WITH RIGHT LOWER EXTREMITY VENOUS LYSIS;  Surgeon: Renford Dills, MD;  Location: [facility] INVASIVE CV LAB;  Service: Cardiovascular;  Laterality: Right;  . IVC FILTER REMOVAL N/A 12/01/2018   Procedure: IVC FILTER REMOVAL;  Surgeon: Annice Needy, MD;  Location: [facility] INVASIVE CV LAB;  Service: Cardiovascular;  Laterality: N/A;  . NO PAST SURGERIES    .  PERIPHERAL VASCULAR THROMBECTOMY Right 10/30/2019   Procedure: PERIPHERAL VASCULAR THROMBECTOMY / THROMBOLYSIS;  Surgeon: Annice Needy, MD;  Location: [facility] INVASIVE CV LAB;  Service: Cardiovascular;  Laterality: Right;  . PERIPHERAL VASCULAR THROMBECTOMY Right 11/05/2019   Procedure: PERIPHERAL VASCULAR THROMBECTOMY / THROMBOLYSIS;  Surgeon: Annice Needy, MD;  Location: [facility] INVASIVE CV LAB;  Service: Cardiovascular;  Laterality: Right;    Prior to Admission medications   Medication Sig Start Date End  Date Taking? Authorizing Provider  albuterol (VENTOLIN HFA) 108 (90 Base) MCG/ACT inhaler Inhale 2 puffs into the lungs every 6 (six) hours as needed for wheezing or shortness of breath. 11/06/19   Alford Highland, MD  furosemide (LASIX) 20 MG tablet Take 1 tablet (20 mg total) by mouth daily. 11/18/20 02/16/21  Delma Freeze, FNP  rivaroxaban (XARELTO) 20 MG TABS tablet Take 1 tablet (20 mg total) by mouth daily with supper. 10/12/20   Danford, Earl Lites, MD    Allergies Amoxicillin  History reviewed. No pertinent family history.  Social History Social History   Tobacco Use  . Smoking status: Current Every Day Smoker    Packs/day: 0.50    Types: Cigarettes  . Smokeless tobacco: Current User  Vaping Use  . Vaping Use: Former  Substance Use Topics  . Alcohol use: Yes    Comment: weekends says every third weekend a 12 pack  . Drug use: Never      Review of Systems Constitutional: + fever Eyes: No visual changes. ENT: No sore throat. Cardiovascular: Denies chest pain. Respiratory: + sob Gastrointestinal: No abdominal pain.  No nausea, no vomiting.  No diarrhea.  No constipation. Genitourinary: Negative for dysuria. + hematuria  Musculoskeletal: Negative for back pain. Skin: Negative for rash. Neurological: Negative for headaches, focal weakness or numbness. All other ROS negative ____________________________________________   PHYSICAL EXAM:  VITAL SIGNS: ED Triage Vitals  Enc Vitals Group     BP 11/25/20 1355 (!) 98/58     Pulse Rate 11/25/20 1355 (!) 115     Resp 11/25/20 1355 20     Temp 11/25/20 1355 (!) 101.8 F (38.8 C)     Temp Source 11/25/20 1355 Oral     SpO2 11/25/20 1350 (!) 88 %     Weight 11/25/20 1356 285 lb (129.3 kg)     Height 11/25/20 1356 5\' 7"  (1.702 m)     Head Circumference --      Peak Flow --      Pain Score 11/25/20 1356 0     Pain Loc --      Pain Edu? --      Excl. in GC? --     Constitutional: Alert and oriented. Well  appearing and in no acute distress. Eyes: Conjunctivae are normal. EOMI. Head: Atraumatic. Nose: No congestion/rhinnorhea. Mouth/Throat: Mucous membranes are moist.   Neck: No stridor. Trachea Midline. FROM Cardiovascular: tachycardiac, regular rhythm. Grossly normal heart sounds.  Good peripheral circulation. Respiratory: Normal respiratory effort.  No retractions. Lungs CTAB. On 2-3L  Gastrointestinal: Soft and nontender. No distention. No abdominal bruits.  Musculoskeletal: 1+ edema bilateral.  Wounds noted but do not look infected no joint effusions. Neurologic:  Normal speech and language. No gross focal neurologic deficits are appreciated.  Skin:  Skin is warm, dry and intact. No rash noted. Psychiatric: Mood and affect are normal. Speech and behavior are normal. GU: Deferred   ____________________________________________   LABS (all labs ordered are listed, but only abnormal results are displayed)  Labs  Reviewed  COMPREHENSIVE METABOLIC PANEL - Abnormal; Notable for the following components:      Result Value   Sodium 134 (*)    Calcium 8.0 (*)    Albumin 2.8 (*)    Alkaline Phosphatase 145 (*)    Total Bilirubin 4.1 (*)    All other components within normal limits  CBC WITH DIFFERENTIAL/PLATELET - Abnormal; Notable for the following components:   WBC 13.4 (*)    Neutro Abs 10.3 (*)    Monocytes Absolute 1.3 (*)    Abs Immature Granulocytes 0.08 (*)    All other components within normal limits  PROTIME-INR - Abnormal; Notable for the following components:   Prothrombin Time 15.4 (*)    INR 1.3 (*)    All other components within normal limits  RESP PANEL BY RT-PCR (FLU A&B, COVID) ARPGX2  CULTURE, BLOOD (ROUTINE X 2)  CULTURE, BLOOD (ROUTINE X 2)  URINE CULTURE  LACTIC ACID, PLASMA  APTT  URINALYSIS, COMPLETE (UACMP) WITH MICROSCOPIC   ____________________________________________   ED ECG REPORT I, Matthew SeMary E Evelyne [name], the attending physician, personally viewed and  interpreted this ECG.  aFIB rate of 103, no ST elevation, T wave version aVL ____________________________________________  RADIOLOGY   Official radiology report(s): CT Angio Chest PE W and/or Wo Contrast  Result Date: 11/25/2020 CLINICAL DATA:  Shortness of breath, PE suspected, weakness and fever for 3 days EXAM: CT ANGIOGRAPHY CHEST WITH CONTRAST TECHNIQUE: Multidetector CT imaging of the chest was performed using the standard protocol during bolus administration of intravenous contrast. Multiplanar CT image reconstructions and MIPs were obtained to evaluate the vascular anatomy. CONTRAST:  100mL OMNIPAQUE IOHEXOL 350 MG/ML SOLN COMPARISON:  09/20/2020 FINDINGS: Cardiovascular: Satisfactory opacification of the pulmonary arteries to the segmental level. 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 No evidence of acute pulmonary embolism. Cardiomegaly. No pericardial effusion. Gross enlargement of the main pulmonary artery measuring up to 5.0 cm in caliber. Mediastinum/Nodes: Unchanged prominent mediastinal lymph nodes. Thyroid gland, trachea, and esophagus demonstrate no significant findings. Lungs/Pleura: Diffuse bilateral bronchial wall thickening. There is new heterogeneous airspace opacity of the posterior right upper lobe (series 6, image 44) and peripheral right lung base (series 6, image 66). Unchanged scarring of the lateral segment right middle lobe (series 6, image 53). No pleural effusion or pneumothorax. Upper Abdomen: No acute abnormality. Musculoskeletal: No chest wall abnormality. Subacute, minimally displaced fractures of the left seventh, eighth, and ninth ribs with minimal callus formation (series 6, image 60). Review of the MIP images confirms the above findings. IMPRESSION: 1. Negative examination for acute pulmonary embolism. Unchanged eccentric, partially calcified chronic thrombus present in the right  interlobar pulmonary artery. 2. There is new heterogeneous airspace opacity of the posterior right upper lobe and peripheral right lung base, nonspecific and infectious or inflammatory. 3. Diffuse bilateral bronchial wall thickening, consistent with nonspecific infectious or inflammatory bronchitis. 4. Cardiomegaly. 5. Gross enlargement of the main pulmonary artery measuring up to 5.0 cm in caliber, as can be seen in pulmonary hypertension. 6. Subacute, minimally displaced fractures of the left seventh, eighth, and ninth ribs with minimal callus formation. These are new compared to prior CT dated 09/20/2020. Electronically Signed   By: Lauralyn PrimesAlex  Bibbey M.D.   On: 11/25/2020 15:41   CT ABDOMEN PELVIS W CONTRAST  Result Date: 11/25/2020 CLINICAL DATA:  Abdominal distension, unable to void since 0930 hours, not able to empty bladder, generalized weakness and fever for 3 days,  hypotensive, history CHF, COPD, pulmonary embolism EXAM: CT ABDOMEN AND PELVIS WITH CONTRAST TECHNIQUE: Multidetector CT imaging of the abdomen and pelvis was performed using the standard protocol following bolus administration of intravenous contrast. Sagittal and coronal MPR images reconstructed from axial data set. CONTRAST:  OMNIPAQUE IOHEXOL 350 MG/ML SOLN IV. No oral contrast. COMPARISON:  11/04/2019, 10/20/2018 FINDINGS: Lower chest: Focus of infiltrate laterally at RIGHT lower lobe. Subsegmental atelectasis at base of RIGHT middle lobe. Hepatobiliary: Calcified 14 mm diameter gallstone within gallbladder. No focal hepatic abnormality. No biliary dilatation. Pancreas: Normal appearance Spleen: Normal appearance Adrenals/Urinary Tract: Adrenal glands unremarkable. Tiny nonobstructing RIGHT renal calculus. Kidneys and ureters normal appearance. Bladder wall thickening with mild perivesicular stranding question cystitis. No ureteral calcification. Stomach/Bowel: Appendix not visualized, no pericecal inflammatory process seen. Densities  at the gastric cardia appear unchanged question calcifications. Stomach and bowel loops otherwise normal appearance. Vascular/Lymphatic: IVC filter present. Atherosclerotic calcification aorta without aneurysm. Few prominent perisplenic vessels question collaterals. No adenopathy. Reproductive: Unremarkable prostate gland and seminal vesicles Other: Tiny umbilical hernia containing fat. No free air or free fluid. Minimal nonspecific stranding of perivesicular fat extending superiorly in the retroperitoneal spaces bilaterally, nonspecific. Musculoskeletal: Bones demineralized. IMPRESSION: Bladder wall thickening with mild perivesicular stranding question cystitis, recommend correlation with urinalysis. Tiny nonobstructing RIGHT renal calculus. Cholelithiasis. Tiny umbilical hernia containing fat. Focus of infiltrate laterally at RIGHT lower lobe question pneumonia. Aortic Atherosclerosis (ICD10-I70.0). Electronically Signed   By: Ulyses Southward M.D.   On: 11/25/2020 15:58   DG Chest Port 1 View  Result Date: 11/25/2020 CLINICAL DATA:  Questionable sepsis, unable to void this morning, generalized weakness and fever for 3 days; history CHF, COPD, pulmonary emboli EXAM: PORTABLE CHEST 1 VIEW COMPARISON:  Portable exam 1422 hours compared to 11/09/2020 FINDINGS: Enlargement of cardiac silhouette with pulmonary vascular congestion. Mediastinal contours normal. Mild hazy interstitial infiltrates likely representing pulmonary edema. No pleural effusion or pneumothorax. Bones demineralized. IMPRESSION: Enlargement of cardiac silhouette with pulmonary vascular congestion and mild pulmonary edema, not significantly changed from previous exam. Electronically Signed   By: Ulyses Southward M.D.   On: 11/25/2020 14:47    ____________________________________________   PROCEDURES  Procedure(s) performed (including Critical Care):  .1-3 Lead EKG Interpretation Performed by: Matthew Se, MD Authorized by: Matthew Se, MD      Interpretation: abnormal     ECG rate:  100S   ECG rate assessment: tachycardic     Rhythm: atrial fibrillation     Ectopy: none     Conduction: normal   .Critical Care Performed by: Matthew Se, MD Authorized by: Matthew Se, MD   Critical care provider statement:    Critical care time (minutes):  45   Critical care was necessary to treat or prevent imminent or life-threatening deterioration of the following conditions:  Sepsis   Critical care was time spent personally by me on the following activities:  Discussions with consultants, evaluation of patient's response to treatment, examination of patient, ordering and performing treatments and interventions, ordering and review of laboratory studies, ordering and review of radiographic studies, pulse oximetry, re-evaluation of patient's condition, obtaining history from patient or surrogate and review of old charts     ____________________________________________   INITIAL IMPRESSION / ASSESSMENT AND PLAN / ED COURSE  Matthew [name] was evaluated in Emergency Department on 11/25/2020 for the symptoms described in the history of present illness. He was evaluated in the context of the global COVID-19 pandemic, which necessitated consideration that  the patient might be at risk for infection with the SARS-CoV-2 virus that causes COVID-19. Institutional protocols and algorithms that pertain to the evaluation of patients at risk for COVID-19 are in a state of rapid change based on information released by regulatory bodies including the CDC and federal and state organizations. These policies and algorithms were followed during the patient's care in the ED.    Patient comes in with fever tachycardia and low blood pressure.  Sepsis alert was called patient start on broad-spectrum antibiotics.  Given patient is hypoxic and x-ray did not show obvious pneumonia and just may be some pulmonary edema patient not been compliant with his  medications CT PE was ordered evaluate for pulmonary embolism, CT abdomen to evaluate for kidney stone that could be infected.  Urine evaluate for UTI.  Covid swab was also ordered.  Patient was started on fluids.  No evidence of cellulitis  Holding off on full fluid resuscitation due to patient having a lot of lymphedema and swelling.  Lactate is normal.  However on recheck patient's blood pressure is still soft and will start 3 L of fluid.  Patient got somewhat EMS.  Patient handoff to oncoming team pending reevaluation.  Patient remains hypotensive may need to be started on Levophed.  Patient already got antibiotics.  Oncoming team will follow up on CTs  Pt total bili is elevated- prior long time ago was normal--> no RUQ pain. Will f/u on urine and may need further imaging to evaluate for CBD stone.       ____________________________________________   FINAL CLINICAL IMPRESSION(S) / ED DIAGNOSES   Final diagnoses:  Sepsis, due to unspecified organism, unspecified whether acute organ dysfunction present (HCC)      MEDICATIONS GIVEN DURI Medications  NG THIS VISIT:    ceFEPIme (MAXIPIME) 2 g in sodium chloride 0.9 % 100 mL IVPB (has no administration in time range)  metroNIDAZOLE (FLAGYL) IVPB 500 mg (has no administration in time range)  sodium chloride 0.9 % bolus 1,000 mL (has no administration in time range)  vancomycin (VANCOREADY) IVPB 2000 mg/400 mL (has no administration in time range)     ED Discharge Orders    None       Note:  This document was prepared using Dragon voice recognition software and may include unintentional dictation errors.   Matthew Se, MD 11/25/20 1616    Matthew Se, MD 11/25/20 [phone]

## 2020-11-25 NOTE — Sepsis Progress Note (Signed)
Sepsis protocol is being followed by [facility]. 

## 2020-11-25 NOTE — ED Provider Notes (Signed)
-----------------------------------------   5:35 PM on 11/25/2020 -----------------------------------------  Patient care assumed from Dr. Fuller Plan.  Urinalysis shows greater than 50 red cells and white cells possibly the source of the patient's infection.  Covid test is negative.  I ordered a right upper quadrant ultrasound given the elevated bilirubin, but right upper quadrant ultrasound negative for obstructive pathology or cholecystitis.  Urine culture and blood cultures pending.  We will admit the patient for IV antibiotics.  Blood pressure improving currently 99/71 receiving his third liter of fluids currently.   Minna Antis, MD 11/25/20 1736

## 2020-11-25 NOTE — Plan of Care (Signed)

## 2020-11-25 NOTE — ED Notes (Signed)
Bladder scan [place] by Dr Lenard Lance, this RN bladder scanned, resulted 0 mL

## 2020-11-25 NOTE — Progress Notes (Signed)
CODE SEPSIS - PHARMACY COMMUNICATION  **Broad Spectrum Antibiotics should be administered within 1 hour of Sepsis diagnosis**  Time Code Sepsis Called/Page Received: 1416  Antibiotics Ordered: vancomycin and cefepime  Time of 1st antibiotic administration: 1438  Additional action taken by pharmacy: none required  If necessary, Name of Provider/Nurse Contacted: N/A    Lowella Bandy ,PharmD Clinical Pharmacist  11/25/2020  2:16 PM

## 2020-11-25 NOTE — ED Notes (Signed)
Pt provided dinner tray.

## 2020-11-25 NOTE — ED Notes (Signed)
Off unit to CT

## 2020-11-25 NOTE — H&P (Signed)
History and Physical   Matthew [name] HQP:[id number] DOB: August 23, 1971 DOA: 11/25/2020  PCP: Langston Reusing, NP  Patient coming from: home  I have personally briefly reviewed patient's old medical records in [place].  Chief Concern: Chills and fever  HPI: Matthew [name] is a 50 y.o. male with medical history significant for history of pulmonary embolism and DVT, medication noncompliance, morbid obesity, hypertension, OSA on CPAP nightly, atrial fibrillation, COPD on 3 L baseline, cholelithiasis, presents to the emergency department for chief concerns of fever and chills.  He states that he has been experiencing these symptoms for 3 to 4 days.  Patient did not specify T-max.  Reports he is felt this way before when he was "sick ".  When asked him to describe to me what he means by "sick" patient responds, "you are the doctor you figured out ".  When asked about his xarelto, he states he 'I threw my xarelto in the trash'. Then patient states, 'I take my xarelto once in a while.'  He denies chest pain, worsening shortness of breath, cough, abdominal pain, diarrhea.  Social history: lives with step son. He drinks 1-3 cases of beer on the weekends and smokes cigarettes.  He denies recreational drug use.  Vaccination: Patient is not vaccinated for COVID-19  H&P is obtained from patient however is limited due to persistent agitation and racing of his voice at me.  Patient does not want to participate in HPI and repeatedly states 'well, you are the doctor you tell me.'  ROS: Constitutional: no weight change, + fever, + chills ENT/Mouth: no sore throat, no rhinorrhea Eyes: no eye pain, no vision changes Cardiovascular: no chest pain, no dyspnea,  no edema, no palpitations Respiratory: no cough, no sputum, no wheezing Gastrointestinal: no nausea, no vomiting, no diarrhea, no constipation Genitourinary: no urinary incontinence, + dysuria, + hematuria Musculoskeletal: no  arthralgias, no myalgias Skin: no skin lesions, no pruritus, Neuro: + weakness, no loss of consciousness, no syncope Psych: no anxiety, no depression, + decrease appetite Heme/Lymph: no bruising, no bleeding  ED Course: Discussed with ED provider, patient requiring hospitalization due to sepsis secondary to urinary tract infection.  Vitals in the emergency department was remarkable for initial fever of 101.8, improved to 98.9, respiration rate of 24, heart rate of 92, blood pressure 97/64 after 2 L bolus per ED provider.  Patient satting at 97% on 3 L nasal cannula.  Labs in the emergency department was remarkable for serum sodium of 134, potassium 3.9, chloride 99, bicarb 26, BUN 16, serum creatinine of 1.04, nonfasting blood glucose of 97, alk phos 145, T bili 4.1, EGFR greater than 60, WBC of 13.4, hemoglobin of 15.6, platelets of 152.  Emergency medicine provider gave patient broad-spectrum cefepime and vancomycin.  CTA of the chest to assess for PE: Read as negative for acute pulmonary embolism, unchanged eccentric partially calcified chronic thrombus present in the right interlobar pulmonary artery, new heterogenous airspace opacity of the posterior right upper lobe and peripheral right lung base, nonspecific infectious or inflammatory.  Diffuse bilateral bronchial wall thickening, consistent with nonspecific infectious or inflammatory bronchitis.  Gross enlargement of pulmonary artery measuring up to 5 cm caliber, seen in pulmonary hypertension.  Subacute minimally displaced fracture of the left seventh, eighth, ninth rib with minimal callus formation.  Limited abdominal ultrasound of the right upper quadrant read as cholelithiasis with 1.9 cm stone mobile within the gallbladder.  No signs of cholecystitis or obstruction.  Assessment/Plan  Principal Problem:   Sepsis associated hypotension (HCC) Active Problems:   DVT of deep femoral vein, right (HCC)   AF (paroxysmal atrial  fibrillation) (HCC)   Prediabetes   Tobacco abuse   Class 3 severe obesity without serious comorbidity with body mass index (BMI) of 40.0 to 44.9 in adult [facility])   COPD (chronic obstructive pulmonary disease) (HCC)   OSA (obstructive sleep apnea)   Pulmonary embolism (HCC)   Atrial fibrillation with RVR (HCC)   Bilateral edema of lower extremity   History of sleep apnea   Sepsis with hypotension-suspect secondary to urinary source Urosepsis with hematuria -Meets sepsis criteria: Fever, atrial fibrillation with RVR, elevated heart rate, elevated respiration rate, hypotension, source of urine -Lactic acid is not elevated at 1.0 -Continue broad-spectrum antibiotic: Cefepime and vancomycin -Blood cultures x2 -Urine culture -Admit to stepdown, inpatient with telemetry  Hypotension and low normotensive-secondary to sepsis -Patient is not taking in antihypertensive medications at this time -Status post normal saline 2 L bolus per ED doctor -LR at 150 mL/h, 16 hours ordered -midodrine 5 mg p.o. 3 times daily as needed for SBP less than 90 -Admit to stepdown, inpatient with telemetry  Atrial fibrillation with RVR-suspect secondary to sepsis -Treat as above  History of pulmonary embolism and DVT-patient is prescribed Xarelto 20 mg daily, however patient is not taking this medication as prescribed -Attempts at education results inpatient agitation and raised voice -Xarelto resumed for 11/26/2020 due to hematuria  CT evidence of pulmonary hypertension-CPAP nightly Obstructive sleep apnea-CPAP nightly ordered  Chart reviewed.   DVT prophylaxis: Xarelto resumed for 11/26/2020 Code Status: Full code Diet: Heart healthy/carb modified Family Communication: No Disposition Plan: Pending clinical course Consults called: None at this time Admission status: Inpatient, stepdown, with telemetry  Past Medical History:  Diagnosis Date  . Arrhythmia    atrial fibrillation  . Arthritis   . CHF  (congestive heart failure) ([place])   . COPD (chronic obstructive pulmonary disease) ([place])   . Dvt femoral (deep venous thrombosis) ([place])   . Hx of blood clots   . Pulmonary embolus ([place])   . Sleep apnea    patient states not anymore   Past Surgical History:  Procedure Laterality Date  . IVC FILTER INSERTION Left 11/24/2018   Procedure: IVC FILTER INSERTION;  Surgeon: Algernon Huxley, MD;  Location: [place] [facility];  Service: Cardiovascular;  Laterality: Left;  . IVC FILTER INSERTION Right 02/18/2019   Procedure: IVC FILTER INSERTION WITH RIGHT LOWER EXTREMITY VENOUS LYSIS;  Surgeon: Katha Cabal, MD;  Location: [place] [facility];  Service: Cardiovascular;  Laterality: Right;  . IVC FILTER REMOVAL N/A 12/01/2018   Procedure: IVC FILTER REMOVAL;  Surgeon: Algernon Huxley, MD;  Location: [place] [facility];  Service: Cardiovascular;  Laterality: N/A;  . NO PAST SURGERIES    . PERIPHERAL VASCULAR THROMBECTOMY Right 10/30/2019   Procedure: PERIPHERAL VASCULAR THROMBECTOMY / THROMBOLYSIS;  Surgeon: Algernon Huxley, MD;  Location: [place] [facility];  Service: Cardiovascular;  Laterality: Right;  . PERIPHERAL VASCULAR THROMBECTOMY Right 11/05/2019   Procedure: PERIPHERAL VASCULAR THROMBECTOMY / THROMBOLYSIS;  Surgeon: Algernon Huxley, MD;  Location: [place] [facility];  Service: Cardiovascular;  Laterality: Right;   Social History:  reports that he has been smoking cigarettes. He has been smoking about 0.50 packs per day. He uses smokeless tobacco. He reports current alcohol use. He reports that he does not use drugs.  Allergies  Allergen Reactions  . Amoxicillin Rash  History reviewed. No pertinent family history. Family history: Family history reviewed and not pertinent  Prior to Admission medications   Medication Sig Start Date End Date Taking? Authorizing Provider  albuterol (VENTOLIN HFA) 108 (90 Base) MCG/ACT inhaler Inhale 2 puffs into the lungs every 6 (six) hours as needed for  wheezing or shortness of breath. 11/06/19   Loletha Grayer, MD  furosemide (LASIX) 20 MG tablet Take 1 tablet (20 mg total) by mouth daily. 11/18/20 02/16/21  Alisa Graff, FNP  rivaroxaban (XARELTO) 20 MG TABS tablet Take 1 tablet (20 mg total) by mouth daily with supper. 10/12/20   Edwin Dada, MD   Physical Exam: Vitals:   11/25/20 1800 11/25/20 1808 11/25/20 1822 11/25/20 1830  BP: 97/65   92/74  Pulse: 90 97 (!) 103 100  Resp: (!) 23 (!) 25 (!) 23 15  Temp:      TempSrc:      SpO2: 96% 96% 97% 97%  Weight:      Height:       Constitutional: appears older than chronological age, NAD, calm, comfortable Eyes: PERRL, lids and conjunctivae normal ENMT: Mucous membranes are moist. Posterior pharynx clear of any exudate or lesions. Age-appropriate dentition. Hearing appropriate Neck: normal, supple, no masses, no thyromegaly Respiratory: clear to auscultation bilaterally, no wheezing, no crackles. Normal respiratory effort. No accessory muscle use.  Cardiovascular: Regular rate and rhythm, no murmurs / rubs / gallops.  Bilateral lower extremity 1+ pitting edema. 2+ pedal pulses. No carotid bruits.  Abdomen: Morbid obesity, no tenderness, no masses palpated, no hepatosplenomegaly. Bowel sounds positive.  Musculoskeletal: no clubbing / cyanosis. No joint deformity upper and lower extremities. Good ROM, no contractures, no atrophy. Normal muscle tone.  Skin: no rashes, lesions, ulcers. No induration.  Chronic venous stasis of the lower extremity bilaterally Neurologic: Sensation intact. Strength 5/5 in all 4.  Psychiatric: Normal judgment and insight. Alert and oriented x 3. Normal mood.   EKG: independently reviewed, showing atrial fibrillation with RVR, rate of 103, QTc 466  Chest x-ray on Admission: I personally reviewed and I agree with radiologist reading as below.  CT Angio Chest PE W and/or Wo Contrast  Result Date: 11/25/2020 CLINICAL DATA:  Shortness of breath, PE  suspected, weakness and fever for 3 days EXAM: CT ANGIOGRAPHY CHEST WITH CONTRAST TECHNIQUE: Multidetector CT imaging of the chest was performed using the standard protocol during bolus administration of intravenous contrast. Multiplanar CT image reconstructions and MIPs were obtained to evaluate the vascular anatomy. CONTRAST:  140m OMNIPAQUE IOHEXOL 350 MG/ML SOLN COMPARISON:  09/20/2020 FINDINGS: Cardiovascular: Satisfactory opacification of the pulmonary arteries to the segmental level. 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 No evidence of acute pulmonary embolism. Cardiomegaly. No pericardial effusion. Gross enlargement of the main pulmonary artery measuring up to 5.0 cm in caliber. Mediastinum/Nodes: Unchanged prominent mediastinal lymph nodes. Thyroid gland, trachea, and esophagus demonstrate no significant findings. Lungs/Pleura: Diffuse bilateral bronchial wall thickening. There is new heterogeneous airspace opacity of the posterior right upper lobe (series 6, image 44) and peripheral right lung base (series 6, image 66). Unchanged scarring of the lateral segment right middle lobe (series 6, image 53). No pleural effusion or pneumothorax. Upper Abdomen: No acute abnormality. Musculoskeletal: No chest wall abnormality. Subacute, minimally displaced fractures of the left seventh, eighth, and ninth ribs with minimal callus formation (series 6, image 60). Review of the MIP images confirms the above findings. IMPRESSION: 1. Negative  examination for acute pulmonary embolism. 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 2. There is new heterogeneous airspace opacity of the posterior right upper lobe and peripheral right lung base, nonspecific and infectious or inflammatory. 3. Diffuse bilateral bronchial wall thickening, consistent with nonspecific infectious or  inflammatory bronchitis. 4. Cardiomegaly. 5. Gross enlargement of the main pulmonary artery measuring up to 5.0 cm in caliber, as can be seen in pulmonary hypertension. 6. Subacute, minimally displaced fractures of the left seventh, eighth, and ninth ribs with minimal callus formation. These are new compared to prior CT dated 09/20/2020. Electronically Signed   By: Eddie Candle M.D.   On: 11/25/2020 15:41   CT ABDOMEN PELVIS W CONTRAST  Result Date: 11/25/2020 CLINICAL DATA:  Abdominal distension, unable to void since 0930 hours, not able to empty bladder, generalized weakness and fever for 3 days, hypotensive, history CHF, COPD, pulmonary embolism EXAM: CT ABDOMEN AND PELVIS WITH CONTRAST TECHNIQUE: Multidetector CT imaging of the abdomen and pelvis was performed using the standard protocol following bolus administration of intravenous contrast. Sagittal and coronal MPR images reconstructed from axial data set. CONTRAST:  127m OMNIPAQUE IOHEXOL 350 MG/ML SOLN IV. No oral contrast. COMPARISON:  11/04/2019, 10/20/2018 FINDINGS: Lower chest: Focus of infiltrate laterally at RIGHT lower lobe. Subsegmental atelectasis at base of RIGHT middle lobe. Hepatobiliary: Calcified 14 mm diameter gallstone within gallbladder. No focal hepatic abnormality. No biliary dilatation. Pancreas: Normal appearance Spleen: Normal appearance Adrenals/Urinary Tract: Adrenal glands unremarkable. Tiny nonobstructing RIGHT renal calculus. Kidneys and ureters normal appearance. Bladder wall thickening with mild perivesicular stranding question cystitis. No ureteral calcification. Stomach/Bowel: Appendix not visualized, no pericecal inflammatory process seen. Densities at the gastric cardia appear unchanged question calcifications. Stomach and bowel loops otherwise normal appearance. Vascular/Lymphatic: IVC filter present. Atherosclerotic calcification aorta without aneurysm. Few prominent perisplenic vessels question collaterals. No  adenopathy. Reproductive: Unremarkable prostate gland and seminal vesicles Other: Tiny umbilical hernia containing fat. No free air or free fluid. Minimal nonspecific stranding of perivesicular fat extending superiorly in the retroperitoneal spaces bilaterally, nonspecific. Musculoskeletal: Bones demineralized. IMPRESSION: Bladder wall thickening with mild perivesicular stranding question cystitis, recommend correlation with urinalysis. Tiny nonobstructing RIGHT renal calculus. Cholelithiasis. Tiny umbilical hernia containing fat. Focus of infiltrate laterally at RIGHT lower lobe question pneumonia. Aortic Atherosclerosis (ICD10-I70.0). Electronically Signed   By: MLavonia DanaM.D.   On: 11/25/2020 15:58   DG Chest Port 1 View  Result Date: 11/25/2020 CLINICAL DATA:  Questionable sepsis, unable to void this morning, generalized weakness and fever for 3 days; history CHF, COPD, pulmonary emboli EXAM: PORTABLE CHEST 1 VIEW COMPARISON:  Portable exam 1422 hours compared to 11/09/2020 FINDINGS: Enlargement of cardiac silhouette with pulmonary vascular congestion. Mediastinal contours normal. Mild hazy interstitial infiltrates likely representing pulmonary edema. No pleural effusion or pneumothorax. Bones demineralized. IMPRESSION: Enlargement of cardiac silhouette with pulmonary vascular congestion and mild pulmonary edema, not significantly changed from previous exam. Electronically Signed   By: MLavonia DanaM.D.   On: 11/25/2020 14:47   [place] LIMITED RUQ (LIVER/GB)  Result Date: 11/25/2020 CLINICAL DATA:  Abnormal liver function tests. EXAM: ULTRASOUND ABDOMEN LIMITED RIGHT UPPER QUADRANT COMPARISON:  CT same day FINDINGS: Gallbladder: 1.9 cm gallstone in the gallbladder. No Murphy sign. No surrounding fluid. Common bile duct: Diameter: 3 mm, normal Liver: Increased echogenicity suggesting fatty change. No focal lesion. No intrahepatic ductal dilatation. Portal vein is patent on color Doppler imaging with  normal direction of blood flow towards the liver.  Other: None. IMPRESSION: Cholelithiasis with a 1.9 cm stone mobile within the gallbladder. No sign of cholecystitis or obstruction. Mild fatty change of the liver. Electronically Signed   By: Nelson Chimes M.D.   On: 11/25/2020 17:26   Labs on Admission: I have personally reviewed following labs  CBC: Recent Labs  Lab 11/25/20 1423  WBC 13.4*  NEUTROABS 10.3*  HGB 15.6  HCT 47.4  MCV 98.8  PLT 161   Basic Metabolic Panel: Recent Labs  Lab 11/25/20 1423  NA 134*  K 3.9  CL 99  CO2 26  GLUCOSE 97  BUN 16  CREATININE 1.04  CALCIUM 8.0*   GFR: Estimated Creatinine Clearance: 109.9 mL/min (by C-G formula based on SCr of 1.04 mg/dL).  Liver Function Tests: Recent Labs  Lab 11/25/20 1423  AST 30  ALT 15  ALKPHOS 145*  BILITOT 4.1*  PROT 7.0  ALBUMIN 2.8*   Coagulation Profile: Recent Labs  Lab 11/25/20 1423  INR 1.3*   CBG: Recent Labs  Lab 11/25/20 2006  GLUCAP 99   Urine analysis:    Component Value Date/Time   COLORURINE AMBER (A) 11/25/2020 1617   APPEARANCEUR CLOUDY (A) 11/25/2020 1617   APPEARANCEUR Cloudy (A) 04/06/2020 1140   LABSPEC 1.030 11/25/2020 1617   PHURINE 5.0 11/25/2020 1617   GLUCOSEU NEGATIVE 11/25/2020 1617   HGBUR LARGE (A) 11/25/2020 1617   BILIRUBINUR NEGATIVE 11/25/2020 1617   BILIRUBINUR Negative 04/06/2020 1140   KETONESUR NEGATIVE 11/25/2020 1617   PROTEINUR 30 (A) 11/25/2020 1617   NITRITE NEGATIVE 11/25/2020 1617   LEUKOCYTESUR LARGE (A) 11/25/2020 1617   CRITICAL CARE Performed by: Briant Cedar Cox  Total critical care time: 30 minutes  Critical care time was exclusive of separately billable procedures and treating other patients.  Critical care was necessary to treat or prevent imminent or life-threatening deterioration. Sepsis and circulatory failure  Critical care was time spent personally by me on the following activities: development of treatment plan with patient  and/or surrogate as well as nursing, discussions with consultants, evaluation of patient's response to treatment, examination of patient, obtaining history from patient or surrogate, ordering and performing treatments and interventions, ordering and review of laboratory studies, ordering and review of radiographic studies, pulse oximetry and re-evaluation of patient's condition.  Amy N Cox D.O. [facility]  If 7PM-7AM, please contact overnight-coverage provider If 7AM-7PM, please contact day coverage provider [email or web address]  11/25/2020, 8:51 PM

## 2020-11-25 NOTE — ED Notes (Signed)
Pt states vanc burning and he has never felt like this before, Dr Lenard Lance notified, verbal order to stop and d/c. No redness, swelling or hives noted

## 2020-11-25 NOTE — ED Triage Notes (Signed)
Patient brought from his job wit c/o of unable to voind since 0930 this morning not able to empty bladder, generalized weakness and fever x 3 days. Patient arrives hypotensive, with normal saline infusing through left ac 20g. Sating 90% on 3LNC

## 2020-11-25 NOTE — ED Notes (Signed)
Pt given urinal as requested. ?

## 2020-11-26 LAB — COMPREHENSIVE METABOLIC PANEL
ALT: 15 U/L (ref 0–44)
AST: 29 U/L (ref 15–41)
Albumin: 2.5 g/dL — ABNORMAL LOW (ref 3.5–5.0)
Alkaline Phosphatase: 140 U/L — ABNORMAL HIGH (ref 38–126)
Anion gap: 6 (ref 5–15)
BUN: 15 mg/dL (ref 6–20)
CO2: 27 mmol/L (ref 22–32)
Calcium: 8.2 mg/dL — ABNORMAL LOW (ref 8.9–10.3)
Chloride: 102 mmol/L (ref 98–111)
Creatinine, Ser: 0.76 mg/dL (ref 0.61–1.24)
GFR, Estimated: >60 mL/min (ref >60)
Glucose, Bld: 110 mg/dL — ABNORMAL HIGH (ref 70–99)
Potassium: 4.3 mmol/L (ref 3.5–5.1)
Sodium: 135 mmol/L (ref 135–145)
Total Bilirubin: 3.7 mg/dL — ABNORMAL HIGH (ref 0.3–1.2)
Total Protein: 6.8 g/dL (ref 6.5–8.1)

## 2020-11-26 LAB — CBC
HCT: 47.8 % (ref 39.0–52.0)
Hemoglobin: 15.2 g/dL (ref 13.0–17.0)
MCH: 32.8 pg (ref 26.0–34.0)
MCHC: 31.8 g/dL (ref 30.0–36.0)
MCV: 103.2 fL — ABNORMAL HIGH (ref 80.0–100.0)
Platelets: 136 10*3/uL — ABNORMAL LOW (ref 150–400)
RBC: 4.63 MIL/uL (ref 4.22–5.81)
RDW: 15.4 % (ref 11.5–15.5)
WBC: 16.1 10*3/uL — ABNORMAL HIGH (ref 4.0–10.5)
nRBC: 0 % (ref 0.0–0.2)

## 2020-11-26 LAB — PROCALCITONIN: Procalcitonin: 0.57 ng/mL

## 2020-11-26 LAB — PROTIME-INR
INR: 1.3 — ABNORMAL HIGH (ref 0.8–1.2)
Prothrombin Time: 15.6 seconds — ABNORMAL HIGH (ref 11.4–15.2)

## 2020-11-26 MED ORDER — CHLORHEXIDINE GLUCONATE CLOTH 2 % EX PADS
6.0000 | MEDICATED_PAD | Freq: Every day | CUTANEOUS | Status: DC
  Administered 2020-11-26 – 2020-11-27 (×2): 6 via TOPICAL

## 2020-11-26 MED ORDER — SODIUM CHLORIDE 0.9 % IV SOLN
2.0000 g | INTRAVENOUS | Status: DC
  Administered 2020-11-26 – 2020-11-27 (×2): 2 g via INTRAVENOUS
  Filled 2020-11-26 (×2): qty 20

## 2020-11-26 NOTE — Consult Note (Signed)
WOC Nurse Consult Note: Reason for Consult: Bilateral LE wounds Wound type:Venous insufficiency Pressure Injury POA: N/A Measurement:Per Nursing FLow Sheet Wound OMV:EHMCN red, painful Drainage (amount, consistency, odor) moderate, serosanguinous on right, serous on left Periwound: intact Dressing procedure/placement/frequency: I have provided Nursing with topical care guidance using a daily soap and water cleanse, saline rinse followed by placement of a silver hydrofiber dressing (Aquacel Advantage) topped with a dry dressing and secured first with Kerlix roll gauze from toes to knees followed by ACE wraps from toes to knees. LEs are to be elevated while in bed or chair.  WOC nursing team will not follow, but will remain available to this patient, the nursing and medical teams.  Please re-consult if needed. Thanks, Ladona Mow, MSN, RN, GNP, [place]  Pager# [phone]

## 2020-11-26 NOTE — Progress Notes (Signed)
16 beats vtach reported to me by ccmd, Dr Nelson Chimes was notified at this time

## 2020-11-26 NOTE — Progress Notes (Signed)
PROGRESS NOTE    Matthew [name]  HQI:[id number] DOB: 1971-01-24 DOA: 11/25/2020 PCP: Langston Reusing, NP   Brief Narrative: Taken from H&P. Matthew [name] is a 50 y.o. male with medical history significant for history of pulmonary embolism and DVT, medication noncompliance, morbid obesity, hypertension, OSA on CPAP nightly, atrial fibrillation, COPD on 3 L baseline, cholelithiasis, presents to the emergency department for chief concerns of fever and chills for the past 3 to 4 days.  There was some compliance issues regarding his use of Xarelto, stating that I use Xarelto once in a while.  He was also having dysuria.  No other complaints.  Admitted for sepsis most likely secondary to UTI.  Subjective: Patient continued to have some dysuria, although some improvement.  He was having sensitive legs which he said is going on for a long time, it hurts when you touch. Had a wound on right lower leg, which he stated that he bumped on something.  Assessment & Plan:   Principal Problem:   Sepsis associated hypotension (HCC) Active Problems:   DVT of deep femoral vein, right (HCC)   AF (paroxysmal atrial fibrillation) (HCC)   Prediabetes   Tobacco abuse   Class 3 severe obesity without serious comorbidity with body mass index (BMI) of 40.0 to 44.9 in adult [facility])   COPD (chronic obstructive pulmonary disease) (HCC)   Sepsis (HCC)   OSA (obstructive sleep apnea)   Pulmonary embolism (HCC)   Atrial fibrillation with RVR (HCC)   Bilateral edema of lower extremity   History of sleep apnea  Sepsis secondary to possible UTI.  Patient met sepsis criteria with being febrile, tachycardia, tachypnea, hypotension and leukocytosis.  CT abdomen with concern of cystitis. CTA negative for PE. Initially received broad-spectrum antibiotics with cefepime and vancomycin per sepsis protocol.  Blood cultures negative so far.  Urine cultures pending.  Procalcitonin at 0.57. He was afebrile this  morning.  Hypotension has been resolved with IV fluid, never required any pressors -De-escalate antibiotics to ceftriaxone.-We will further de-escalate once culture results are available. -Continue with supportive care.  Hypotension.  Blood pressure within goal now. -Continue with midodrine.  Paroxysmal atrial fibrillation.  Admitted with A. fib and RVR.  Rate well controlled and appears regular at this time. -Continue with Xarelto  History of DVT and PE.  CTA was negative for PE.  There is some concern about compliance with Xarelto. -We discussed again the importance of staying compliant with Xarelto -Continue with Xarelto  History of alcohol abuse.  Right upper quadrant ultrasound with fatty liver and cholelithiasis with no sign of cholecystitis. -Continue with CIWA protocol -Continue with folic acid and thiamine supplement  CT evidence of pulmonary hypertension.  Most likely secondary to obstructive sleep apnea. -Continue with CPAP at night   Objective: Vitals:   11/26/20 1000 11/26/20 1100 11/26/20 1200 11/26/20 1220  BP: 96/71 108/82 107/77   Pulse: 86 82 85 88  Resp: (!) 28 (!) 21 (!) 23 19  Temp:   98.6 F (37 C)   TempSrc:      SpO2: 90% 95% 92% 95%  Weight:      Height:        Intake/Output Summary (Last 24 hours) at 11/26/2020 1237 Last data filed at 11/26/2020 1022 Gross per 24 hour  Intake 3688.09 ml  Output 1450 ml  Net 2238.09 ml   Filed Weights   11/25/20 1356  Weight: 129.3 kg    Examination:  General exam: Appears calm  and comfortable  Respiratory system: Clear to auscultation. Respiratory effort normal. Cardiovascular system: S1 & S2 heard, RRR. No JVD, murmurs, rubs, gallops or clicks. Gastrointestinal system: Soft, nontender, nondistended, bowel sounds positive. Central nervous system: Alert and oriented. No focal neurological deficits.Symmetric 5 x 5 power. Extremities: Bilateral lower extremity with signs of chronic venous dermatitis, right  lower extremity with a wound, pulses intact and symmetrical. Psychiatry: Judgement and insight appear normal.   DVT prophylaxis: Xarelto Code Status: Full Family Communication: Discussed with patient Disposition Plan:  Status is: Inpatient  Remains inpatient appropriate because:Inpatient level of care appropriate due to severity of illness   Dispo: The patient is from: Home              Anticipated d/c is to: Home              Patient currently is not medically stable to d/c.   Difficult to place patient No              Level of care: Med-Surg  All the records are reviewed and case discussed with Care Management/Social Worker. Management plans discussed with the patient, nursing and they are in agreement.  Consultants:   None  Procedures:  Antimicrobials:  Ceftriaxone  Data Reviewed: I have personally reviewed following labs and imaging studies  CBC: Recent Labs  Lab 11/25/20 1423 11/26/20 0712  WBC 13.4* 16.1*  NEUTROABS 10.3*  --   HGB 15.6 15.2  HCT 47.4 47.8  MCV 98.8 103.2*  PLT 152 800*   Basic Metabolic Panel: Recent Labs  Lab 11/25/20 1423 11/26/20 0712  NA 134* 135  K 3.9 4.3  CL 99 102  CO2 26 27  GLUCOSE 97 110*  BUN 16 15  CREATININE 1.04 0.76  CALCIUM 8.0* 8.2*   GFR: Estimated Creatinine Clearance: 142.8 mL/min (by C-G formula based on SCr of 0.76 mg/dL). Liver Function Tests: Recent Labs  Lab 11/25/20 1423 11/26/20 0712  AST 30 29  ALT 15 15  ALKPHOS 145* 140*  BILITOT 4.1* 3.7*  PROT 7.0 6.8  ALBUMIN 2.8* 2.5*   No results for input(s): LIPASE, AMYLASE in the last 168 hours. No results for input(s): AMMONIA in the last 168 hours. Coagulation Profile: Recent Labs  Lab 11/25/20 1423 11/26/20 0712  INR 1.3* 1.3*   Cardiac Enzymes: No results for input(s): CKTOTAL, CKMB, CKMBINDEX, TROPONINI in the last 168 hours. BNP (last 3 results) No results for input(s): PROBNP in the last 8760 hours. HbA1C: No results for input(s):  HGBA1C in the last 72 hours. CBG: Recent Labs  Lab 11/25/20 2006  GLUCAP 99   Lipid Profile: No results for input(s): CHOL, HDL, LDLCALC, TRIG, CHOLHDL, LDLDIRECT in the last 72 hours. Thyroid Function Tests: Recent Labs    11/25/20 1423  TSH 4.917*   Anemia Panel: No results for input(s): VITAMINB12, FOLATE, FERRITIN, TIBC, IRON, RETICCTPCT in the last 72 hours. Sepsis Labs: Recent Labs  Lab 11/25/20 1423 11/26/20 0712  PROCALCITON  --  0.57  LATICACIDVEN 1.0  --     Recent Results (from the past 240 hour(s))  Resp Panel by RT-PCR (Flu A&B, Covid) Nasopharyngeal Swab     Status: None   Collection Time: 11/25/20  2:23 PM   Specimen: Nasopharyngeal Swab; Nasopharyngeal(NP) swabs in vial transport medium  Result Value Ref Range Status   SARS Coronavirus 2 by RT PCR NEGATIVE NEGATIVE Final    Comment: (NOTE) SARS-CoV-2 target nucleic acids are NOT DETECTED.  The SARS-CoV-2 RNA  is generally detectable in upper respiratory specimens during the acute phase of infection. The lowest concentration of SARS-CoV-2 viral copies this assay can detect is 138 copies/mL. A negative result does not preclude SARS-Cov-2 infection and should not be used as the sole basis for treatment or other patient management decisions. A negative result may occur with  improper specimen collection/handling, submission of specimen other than nasopharyngeal swab, presence of viral mutation(s) within the areas targeted by this assay, and inadequate number of viral copies(<138 copies/mL). A negative result must be combined with clinical observations, patient history, and epidemiological information. The expected result is Negative.  Fact Sheet for Patients:  EntrepreneurPulse.com.au  Fact Sheet for Healthcare Providers:  IncredibleEmployment.be  This test is no t yet approved or cleared by the [place] FDA and  has been authorized for detection and/or  diagnosis of SARS-CoV-2 by FDA under an Emergency Use Authorization (EUA). This EUA will remain  in effect (meaning this test can be used) for the duration of the COVID-19 declaration under Section 564(b)(1) of the Act, 21 U.S.C.section 360bbb-3(b)(1), unless the authorization is terminated  or revoked sooner.       Influenza A by PCR NEGATIVE NEGATIVE Final   Influenza B by PCR NEGATIVE NEGATIVE Final    Comment: (NOTE) The Xpert Xpress SARS-CoV-2/FLU/RSV plus assay is intended as an aid in the diagnosis of influenza from Nasopharyngeal swab specimens and should not be used as a sole basis for treatment. Nasal washings and aspirates are unacceptable for Xpert Xpress SARS-CoV-2/FLU/RSV testing.  Fact Sheet for Patients: EntrepreneurPulse.com.au  Fact Sheet for Healthcare Providers: IncredibleEmployment.be  This test is not yet approved or cleared by the [place] FDA and has been authorized for detection and/or diagnosis of SARS-CoV-2 by FDA under an Emergency Use Authorization (EUA). This EUA will remain in effect (meaning this test can be used) for the duration of the COVID-19 declaration under Section 564(b)(1) of the Act, 21 U.S.C. section 360bbb-3(b)(1), unless the authorization is terminated or revoked.  Performed at [facility], [address] [place], [place] [address]   Blood Culture (routine x 2)     Status: None (Preliminary result)   Collection Time: 11/25/20  2:23 PM   Specimen: BLOOD  Result Value Ref Range Status   Specimen Description BLOOD RIGHT ANTECUBITAL  Final   Special Requests   Final    BOTTLES DRAWN AEROBIC AND ANAEROBIC Blood Culture results may not be optimal due to an inadequate volume of blood received in culture bottles   Culture   Final    NO GROWTH < 24 HOURS Performed at [facility], [address] [place], [place] [address]    Report Status PENDING  Incomplete  MRSA PCR  Screening     Status: None   Collection Time: 11/25/20  8:21 PM   Specimen: Nasal Mucosa; Nasopharyngeal  Result Value Ref Range Status   MRSA by PCR NEGATIVE NEGATIVE Final    Comment:        The GeneXpert MRSA Assay (FDA approved for NASAL specimens only), is one component of a comprehensive MRSA colonization surveillance program. It is not intended to diagnose MRSA infection nor to guide or monitor treatment for MRSA infections. Performed at [facility], [address] [place], [place] [address]      Radiology Studies: CT Angio Chest PE W and/or Wo Contrast  Result Date: 11/25/2020 CLINICAL DATA:  Shortness of breath, PE suspected, weakness and fever for 3 days EXAM: CT ANGIOGRAPHY  CHEST WITH CONTRAST TECHNIQUE: Multidetector CT imaging of the chest was performed using the standard protocol during bolus administration of intravenous contrast. Multiplanar CT image reconstructions and MIPs were obtained to evaluate the vascular anatomy. CONTRAST:  142m OMNIPAQUE IOHEXOL 350 MG/ML SOLN COMPARISON:  09/20/2020 FINDINGS: Cardiovascular: Satisfactory opacification of the pulmonary arteries to the segmental level. 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 No evidence of acute pulmonary embolism. Cardiomegaly. No pericardial effusion. Gross enlargement of the main pulmonary artery measuring up to 5.0 cm in caliber. Mediastinum/Nodes: Unchanged prominent mediastinal lymph nodes. Thyroid gland, trachea, and esophagus demonstrate no significant findings. Lungs/Pleura: Diffuse bilateral bronchial wall thickening. There is new heterogeneous airspace opacity of the posterior right upper lobe (series 6, image 44) and peripheral right lung base (series 6, image 66). Unchanged scarring of the lateral segment right middle lobe (series 6, image 53). No pleural effusion or pneumothorax. Upper Abdomen: No acute  abnormality. Musculoskeletal: No chest wall abnormality. Subacute, minimally displaced fractures of the left seventh, eighth, and ninth ribs with minimal callus formation (series 6, image 60). Review of the MIP images confirms the above findings. IMPRESSION: 1. Negative examination for acute pulmonary embolism. 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 2. There is new heterogeneous airspace opacity of the posterior right upper lobe and peripheral right lung base, nonspecific and infectious or inflammatory. 3. Diffuse bilateral bronchial wall thickening, consistent with nonspecific infectious or inflammatory bronchitis. 4. Cardiomegaly. 5. Gross enlargement of the main pulmonary artery measuring up to 5.0 cm in caliber, as can be seen in pulmonary hypertension. 6. Subacute, minimally displaced fractures of the left seventh, eighth, and ninth ribs with minimal callus formation. These are new compared to prior CT dated 09/20/2020. Electronically Signed   By: AEddie CandleM.D.   On: 11/25/2020 15:41   CT ABDOMEN PELVIS W CONTRAST  Result Date: 11/25/2020 CLINICAL DATA:  Abdominal distension, unable to void since 0930 hours, not able to empty bladder, generalized weakness and fever for 3 days, hypotensive, history CHF, COPD, pulmonary embolism EXAM: CT ABDOMEN AND PELVIS WITH CONTRAST TECHNIQUE: Multidetector CT imaging of the abdomen and pelvis was performed using the standard protocol following bolus administration of intravenous contrast. Sagittal and coronal MPR images reconstructed from axial data set. CONTRAST:  1010mOMNIPAQUE IOHEXOL 350 MG/ML SOLN IV. No oral contrast. COMPARISON:  11/04/2019, 10/20/2018 FINDINGS: Lower chest: Focus of infiltrate laterally at RIGHT lower lobe. Subsegmental atelectasis at base of RIGHT middle lobe. Hepatobiliary: Calcified 14 mm diameter gallstone within gallbladder. No focal hepatic abnormality. No biliary  dilatation. Pancreas: Normal appearance Spleen: Normal appearance Adrenals/Urinary Tract: Adrenal glands unremarkable. Tiny nonobstructing RIGHT renal calculus. Kidneys and ureters normal appearance. Bladder wall thickening with mild perivesicular stranding question cystitis. No ureteral calcification. Stomach/Bowel: Appendix not visualized, no pericecal inflammatory process seen. Densities at the gastric cardia appear unchanged question calcifications. Stomach and bowel loops otherwise normal appearance. Vascular/Lymphatic: IVC filter present. Atherosclerotic calcification aorta without aneurysm. Few prominent perisplenic vessels question collaterals. No adenopathy. Reproductive: Unremarkable prostate gland and seminal vesicles Other: Tiny umbilical hernia containing fat. No free air or free fluid. Minimal nonspecific stranding of perivesicular fat extending superiorly in the retroperitoneal spaces bilaterally, nonspecific. Musculoskeletal: Bones demineralized. IMPRESSION: Bladder wall thickening with mild perivesicular stranding question cystitis, recommend correlation with urinalysis. Tiny nonobstructing RIGHT renal calculus. Cholelithiasis. Tiny umbilical hernia containing fat. Focus of infiltrate laterally at RIGHT lower lobe question pneumonia. Aortic Atherosclerosis (  ICD10-I70.0). Electronically Signed   By: Lavonia Dana M.D.   On: 11/25/2020 15:58   DG Chest Port 1 View  Result Date: 11/25/2020 CLINICAL DATA:  Questionable sepsis, unable to void this morning, generalized weakness and fever for 3 days; history CHF, COPD, pulmonary emboli EXAM: PORTABLE CHEST 1 VIEW COMPARISON:  Portable exam 1422 hours compared to 11/09/2020 FINDINGS: Enlargement of cardiac silhouette with pulmonary vascular congestion. Mediastinal contours normal. Mild hazy interstitial infiltrates likely representing pulmonary edema. No pleural effusion or pneumothorax. Bones demineralized. IMPRESSION: Enlargement of cardiac silhouette  with pulmonary vascular congestion and mild pulmonary edema, not significantly changed from previous exam. Electronically Signed   By: Lavonia Dana M.D.   On: 11/25/2020 14:47   US ABDOMEN LIMITED RUQ (LIVER/GB)  Result Date: 11/25/2020 CLINICAL DATA:  Abnormal liver function tests. EXAM: ULTRASOUND ABDOMEN LIMITED RIGHT UPPER QUADRANT COMPARISON:  CT same day FINDINGS: Gallbladder: 1.9 cm gallstone in the gallbladder. No Murphy sign. No surrounding fluid. Common bile duct: Diameter: 3 mm, normal Liver: Increased echogenicity suggesting fatty change. No focal lesion. No intrahepatic ductal dilatation. Portal vein is patent on color Doppler imaging with normal direction of blood flow towards the liver. Other: None. IMPRESSION: Cholelithiasis with a 1.9 cm stone mobile within the gallbladder. No sign of cholecystitis or obstruction. Mild fatty change of the liver. Electronically Signed   By: Nelson Chimes M.D.   On: 11/25/2020 17:26    Scheduled Meds: . Chlorhexidine Gluconate Cloth  6 each Topical Daily  . folic acid  1 mg Oral Daily  . multivitamin with minerals  1 tablet Oral Daily  . rivaroxaban  20 mg Oral Q supper  . thiamine  100 mg Oral Daily   Or  . thiamine  100 mg Intravenous Daily   Continuous Infusions: . cefTRIAXone (ROCEPHIN)  IV 2 g (11/26/20 1100)  . lactated ringers Stopped (11/26/20 0531)     LOS: 1 day   Time spent: 40 minutes More than 50% of the time was spent in counseling/coordination of care  Lorella Nimrod, MD [facility]  If 7PM-7AM, please contact night-coverage [email or web address]  11/26/2020, 12:37 PM   This record has been created using Systems analyst. Errors have been sought and corrected,but may not always be located. Such creation errors do not reflect on the standard of care.

## 2020-11-26 NOTE — Progress Notes (Signed)
Received report from [facility] in ICU.  Pt to transfer to 1C room 114

## 2020-11-26 NOTE — Progress Notes (Signed)
Pt arrived to 114 for continued medical treatment. Alert and oriented on room air, denies pain or discomfort. Cell phone and charger are in the bed with patient.  His charger plug and clothing are in white patient belonging bag in patient room

## 2020-11-27 ENCOUNTER — Encounter (INDEPENDENT_AMBULATORY_CARE_PROVIDER_SITE_OTHER): Payer: Self-pay | Admitting: Nurse Practitioner

## 2020-11-27 ENCOUNTER — Other Ambulatory Visit: Payer: Self-pay | Admitting: Internal Medicine

## 2020-11-27 LAB — CBC
HCT: 48.8 % (ref 39.0–52.0)
Hemoglobin: 15.8 g/dL (ref 13.0–17.0)
MCH: 33 pg (ref 26.0–34.0)
MCHC: 32.4 g/dL (ref 30.0–36.0)
MCV: 101.9 fL — ABNORMAL HIGH (ref 80.0–100.0)
Platelets: 130 10*3/uL — ABNORMAL LOW (ref 150–400)
RBC: 4.79 MIL/uL (ref 4.22–5.81)
RDW: 15.5 % (ref 11.5–15.5)
WBC: 15.1 10*3/uL — ABNORMAL HIGH (ref 4.0–10.5)
nRBC: 0 % (ref 0.0–0.2)

## 2020-11-27 LAB — BASIC METABOLIC PANEL
Anion gap: 6 (ref 5–15)
BUN: 17 mg/dL (ref 6–20)
CO2: 26 mmol/L (ref 22–32)
Calcium: 8.9 mg/dL (ref 8.9–10.3)
Chloride: 101 mmol/L (ref 98–111)
Creatinine, Ser: 0.94 mg/dL (ref 0.61–1.24)
GFR, Estimated: >60 mL/min (ref >60)
Glucose, Bld: 108 mg/dL — ABNORMAL HIGH (ref 70–99)
Potassium: 4.2 mmol/L (ref 3.5–5.1)
Sodium: 133 mmol/L — ABNORMAL LOW (ref 135–145)

## 2020-11-27 LAB — URINE CULTURE: Culture: <10000 — AB

## 2020-11-27 MED ORDER — FOLIC ACID 1 MG PO TABS: 1.0000 mg | ORAL_TABLET | Freq: Every day | ORAL | 1 refills | Status: AC

## 2020-11-27 MED ORDER — ADULT MULTIVITAMIN W/MINERALS CH: 1.0000 | ORAL_TABLET | Freq: Every day | ORAL | 0 refills | Status: AC

## 2020-11-27 MED ORDER — THIAMINE HCL 100 MG PO TABS: 100.0000 mg | ORAL_TABLET | Freq: Every day | ORAL | 0 refills | Status: AC

## 2020-11-27 MED ORDER — CEPHALEXIN 500 MG PO CAPS: 500.0000 mg | ORAL_CAPSULE | Freq: Four times a day (QID) | ORAL | 0 refills | Status: AC

## 2020-11-27 NOTE — Discharge Summary (Signed)
Physician Discharge Summary  Matthew [name] VPX:[id number] DOB: 01/19/1971 DOA: 11/25/2020  PCP: Langston Reusing, NP  Admit date: 11/25/2020 Discharge date: 11/27/2020  Admitted From: Home Disposition: Home  Recommendations for Outpatient Follow-up:  1. Follow up with PCP in 1-2 weeks 2. Please obtain BMP/CBC in one week 3. Please follow up on the following pending results: None  Home Health: No Equipment/Devices: None Discharge Condition: Stable CODE STATUS: Full Diet recommendation: Heart Healthy   Brief/Interim Summary: Matthew Billings D Klipsteinis a 50 y.o.malewith medical history significant forhistory of pulmonary embolism and DVT, medication noncompliance, morbid obesity, hypertension, OSA on CPAP nightly, atrial fibrillation, COPD on 3 L baseline, cholelithiasis, presents to the emergency department for chief concerns of fever and chills for the past 3 to 4 days. Initially met sepsis criteria with being febrile, tachycardia, tachypnea, hypotension and leukocytosis.  CT abdomen with concern of cystitis.  CTA was negative for PE.  He initially received broad-spectrum antibiotics with cefepime and vancomycin per sepsis protocol.  Antibiotic was discontinued D escalated to ceftriaxone for concern of UTI/lower extremity cellulitis where he had a small wound with no significant drainage.  Per patient he bumped his leg.  Wound care was also consulted and he was instructed about bandage change. Blood and urine cultures were negative.  Patient was discharged 3 more days of Keflex for lower extremity wound and mild cellulitis.  Patient also has an history of paroxysmal atrial fibrillation.  Rate well controlled at this time.  He was not compliance with his Xarelto, stating that he takes it occasionally.  He was advised to taking it regularly.  Patient was complaining of having fevers at night for a while, no documentation.  Each time his fever was checked it was within normal limit.  He was  advised to discuss with primary care provider for further recommendations.  Patient also has an history of alcohol abuse.  Right upper quadrant ultrasound with fatty liver and cholelithiasis with no sign of cholecystitis.  MCV was mildly elevated and he was started on folic acid and thiamine supplements.  There is an CT evidence of pulmonary hypertension most likely secondary to sleep apnea.  He was advised to use his CPAP regularly at night and follow-up with his primary care provider.  He will continue rest of his home medications.  Discharge Diagnoses:  Principal Problem:   Sepsis associated hypotension ([place]) Active Problems:   DVT of deep femoral vein, right (HCC)   AF (paroxysmal atrial fibrillation) (HCC)   Prediabetes   Tobacco abuse   Class 3 severe obesity without serious comorbidity with body mass index (BMI) of 40.0 to 44.9 in adult [facility])   COPD (chronic obstructive pulmonary disease) (HCC)   Sepsis (HCC)   OSA (obstructive sleep apnea)   Pulmonary embolism (HCC)   Atrial fibrillation with RVR (HCC)   Bilateral edema of lower extremity   History of sleep apnea   Discharge Instructions  Discharge Instructions    Diet - low sodium heart healthy   Complete by: As directed    Discharge instructions   Complete by: As directed    It was pleasure taking care of you. You are being given antibiotics for 3 more day, please take it as directed. Keep taking your Xarelto and follow up with your Doctor for further recommendations.   Discharge wound care:   Complete by: As directed    cleanse with soap and water, rinse with NS and pat gently dry.  Cover wounds with size appropriate  piece of silver hydrofiber dressing (Aquacel Advantage, [employer] # [id number]). Top with dry gauze 4x4s, then ABD pad.  Secure by wrapping from just below toes to just below knees with Kerlix roll gauze. Top with ACE bandage wrapped in a similar manner.  Change daily and PRN drainage strike through onto ACE  bandage.  Elevate LEs while in bed and chair.   Increase activity slowly   Complete by: As directed      Allergies as of 11/27/2020      Reactions   Amoxicillin Rash      Medication List    TAKE these medications   albuterol 108 (90 Base) MCG/ACT inhaler Commonly known as: VENTOLIN HFA Inhale 2 puffs into the lungs every 6 (six) hours as needed for wheezing or shortness of breath.   cephALEXin 500 MG capsule Commonly known as: KEFLEX Take 1 capsule (500 mg total) by mouth 4 (four) times daily for 3 days.   folic acid 1 MG tablet Commonly known as: FOLVITE Take 1 tablet (1 mg total) by mouth daily. Start taking on: November 28, 2020   furosemide 20 MG tablet Commonly known as: LASIX Take 1 tablet (20 mg total) by mouth daily.   multivitamin with minerals Tabs tablet Take 1 tablet by mouth daily. Start taking on: November 28, 2020   rivaroxaban 20 MG Tabs tablet Commonly known as: XARELTO Take 1 tablet (20 mg total) by mouth daily with supper.   thiamine 100 MG tablet Take 1 tablet (100 mg total) by mouth daily. Start taking on: November 28, 2020            Discharge Care Instructions  (From admission, onward)         Start     Ordered   11/27/20 0000  Discharge wound care:       Comments: cleanse with soap and water, rinse with NS and pat gently dry.  Cover wounds with size appropriate piece of silver hydrofiber dressing (Aquacel Advantage, [employer] # [id number]). Top with dry gauze 4x4s, then ABD pad.  Secure by wrapping from just below toes to just below knees with Kerlix roll gauze. Top with ACE bandage wrapped in a similar manner.  Change daily and PRN drainage strike through onto ACE bandage.  Elevate LEs while in bed and chair.   11/27/20 1116          Follow-up Information    Iloabachie, Chioma E, NP. Schedule an appointment as soon as possible for a visit.   Specialty: Gerontology Contact information: [address] [place]  [address] [phone]              Allergies  Allergen Reactions  . Amoxicillin Rash    Consultations:  None  Procedures/Studies: DG Chest 2 View  Result Date: 11/09/2020 CLINICAL DATA:  Abdominal swelling peripheral edema EXAM: CHEST - 2 VIEW COMPARISON:  December 22, 2019 FINDINGS: The heart size and mediastinal contours are mildly enlarged. There appears to be slight widening of the upper mediastinum, which may be rotational. There is prominence of the central pulmonary vasculature. No large airspace consolidation or pleural effusion. The visualized skeletal structures are unremarkable. IMPRESSION: Possible widening of the upper mediastinum which may be rotational. If clinical concern remains would recommend repeat radiograph. Mild pulmonary vascular congestion. Electronically Signed   By: Prudencio Pair M.D.   On: 11/09/2020 11:57   CT Angio Chest PE W and/or Wo Contrast  Result Date: 11/25/2020 CLINICAL DATA:  Shortness of breath,  PE suspected, weakness and fever for 3 days EXAM: CT ANGIOGRAPHY CHEST WITH CONTRAST TECHNIQUE: Multidetector CT imaging of the chest was performed using the standard protocol during bolus administration of intravenous contrast. Multiplanar CT image reconstructions and MIPs were obtained to evaluate the vascular anatomy. CONTRAST:  174m OMNIPAQUE IOHEXOL 350 MG/ML SOLN COMPARISON:  09/20/2020 FINDINGS: Cardiovascular: Satisfactory opacification of the pulmonary arteries to the segmental level. 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 No evidence of acute pulmonary embolism. Cardiomegaly. No pericardial effusion. Gross enlargement of the main pulmonary artery measuring up to 5.0 cm in caliber. Mediastinum/Nodes: Unchanged prominent mediastinal lymph nodes. Thyroid gland, trachea, and esophagus demonstrate no significant findings. Lungs/Pleura: Diffuse bilateral bronchial wall thickening.  There is new heterogeneous airspace opacity of the posterior right upper lobe (series 6, image 44) and peripheral right lung base (series 6, image 66). Unchanged scarring of the lateral segment right middle lobe (series 6, image 53). No pleural effusion or pneumothorax. Upper Abdomen: No acute abnormality. Musculoskeletal: No chest wall abnormality. Subacute, minimally displaced fractures of the left seventh, eighth, and ninth ribs with minimal callus formation (series 6, image 60). Review of the MIP images confirms the above findings. IMPRESSION: 1. Negative examination for acute pulmonary embolism. 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 2. There is new heterogeneous airspace opacity of the posterior right upper lobe and peripheral right lung base, nonspecific and infectious or inflammatory. 3. Diffuse bilateral bronchial wall thickening, consistent with nonspecific infectious or inflammatory bronchitis. 4. Cardiomegaly. 5. Gross enlargement of the main pulmonary artery measuring up to 5.0 cm in caliber, as can be seen in pulmonary hypertension. 6. Subacute, minimally displaced fractures of the left seventh, eighth, and ninth ribs with minimal callus formation. These are new compared to prior CT dated 09/20/2020. Electronically Signed   By: AEddie CandleM.D.   On: 11/25/2020 15:41   CT ABDOMEN PELVIS W CONTRAST  Result Date: 11/25/2020 CLINICAL DATA:  Abdominal distension, unable to void since 0930 hours, not able to empty bladder, generalized weakness and fever for 3 days, hypotensive, history CHF, COPD, pulmonary embolism EXAM: CT ABDOMEN AND PELVIS WITH CONTRAST TECHNIQUE: Multidetector CT imaging of the abdomen and pelvis was performed using the standard protocol following bolus administration of intravenous contrast. Sagittal and coronal MPR images reconstructed from axial data set. CONTRAST:  1026mOMNIPAQUE IOHEXOL 350 MG/ML SOLN IV. No  oral contrast. COMPARISON:  11/04/2019, 10/20/2018 FINDINGS: Lower chest: Focus of infiltrate laterally at RIGHT lower lobe. Subsegmental atelectasis at base of RIGHT middle lobe. Hepatobiliary: Calcified 14 mm diameter gallstone within gallbladder. No focal hepatic abnormality. No biliary dilatation. Pancreas: Normal appearance Spleen: Normal appearance Adrenals/Urinary Tract: Adrenal glands unremarkable. Tiny nonobstructing RIGHT renal calculus. Kidneys and ureters normal appearance. Bladder wall thickening with mild perivesicular stranding question cystitis. No ureteral calcification. Stomach/Bowel: Appendix not visualized, no pericecal inflammatory process seen. Densities at the gastric cardia appear unchanged question calcifications. Stomach and bowel loops otherwise normal appearance. Vascular/Lymphatic: IVC filter present. Atherosclerotic calcification aorta without aneurysm. Few prominent perisplenic vessels question collaterals. No adenopathy. Reproductive: Unremarkable prostate gland and seminal vesicles Other: Tiny umbilical hernia containing fat. No free air or free fluid. Minimal nonspecific stranding of perivesicular fat extending superiorly in the retroperitoneal spaces bilaterally, nonspecific. Musculoskeletal: Bones demineralized. IMPRESSION: Bladder wall thickening with mild perivesicular stranding question cystitis, recommend correlation with urinalysis. Tiny nonobstructing RIGHT renal calculus. Cholelithiasis. Tiny umbilical hernia containing fat. Focus  of infiltrate laterally at RIGHT lower lobe question pneumonia. Aortic Atherosclerosis (ICD10-I70.0). Electronically Signed   By: Lavonia Dana M.D.   On: 11/25/2020 15:58   DG Chest Port 1 View  Result Date: 11/25/2020 CLINICAL DATA:  Questionable sepsis, unable to void this morning, generalized weakness and fever for 3 days; history CHF, COPD, pulmonary emboli EXAM: PORTABLE CHEST 1 VIEW COMPARISON:  Portable exam 1422 hours compared to  11/09/2020 FINDINGS: Enlargement of cardiac silhouette with pulmonary vascular congestion. Mediastinal contours normal. Mild hazy interstitial infiltrates likely representing pulmonary edema. No pleural effusion or pneumothorax. Bones demineralized. IMPRESSION: Enlargement of cardiac silhouette with pulmonary vascular congestion and mild pulmonary edema, not significantly changed from previous exam. Electronically Signed   By: Lavonia Dana M.D.   On: 11/25/2020 14:47   US ABDOMEN LIMITED RUQ (LIVER/GB)  Result Date: 11/25/2020 CLINICAL DATA:  Abnormal liver function tests. EXAM: ULTRASOUND ABDOMEN LIMITED RIGHT UPPER QUADRANT COMPARISON:  CT same day FINDINGS: Gallbladder: 1.9 cm gallstone in the gallbladder. No Murphy sign. No surrounding fluid. Common bile duct: Diameter: 3 mm, normal Liver: Increased echogenicity suggesting fatty change. No focal lesion. No intrahepatic ductal dilatation. Portal vein is patent on color Doppler imaging with normal direction of blood flow towards the liver. Other: None. IMPRESSION: Cholelithiasis with a 1.9 cm stone mobile within the gallbladder. No sign of cholecystitis or obstruction. Mild fatty change of the liver. Electronically Signed   By: Nelson Chimes M.D.   On: 11/25/2020 17:26     Subjective: Patient was complaining of fever in the evening, stating that nurses are treating it with medications.  No documentation, he will become very frustrated when I told him that there is not recorded fever except the first reading when he came to ED.  Discharge Exam: Vitals:   11/27/20 0609 11/27/20 0819  BP: 104/76 111/71  Pulse: 88 87  Resp: 20 20  Temp: 98.7 F (37.1 C) 98.3 F (36.8 C)  SpO2: 93% (!) 86%   Vitals:   11/26/20 2320 11/27/20 0036 11/27/20 0609 11/27/20 0819  BP:  108/74 104/76 111/71  Pulse:  82 88 87  Resp:  _0 Temp:  (!) 97.4 F (36.3 C) 98.7 F (37.1 C) 98.3 F (36.8 C)  TempSrc:  Oral Oral Oral  SpO2: 93% 94% 93% (!) 86%   Weight:      Height:        General: Pt is alert, awake, not in acute distress Cardiovascular: RRR, S1/S2 +, no rubs, no gallops Respiratory: CTA bilaterally, no wheezing, no rhonchi Abdominal: Soft, NT, ND, bowel sounds + Extremities: no edema, no cyanosis   The results of significant diagnostics from this hospitalization (including imaging, microbiology, ancillary and laboratory) are listed below for reference.    Microbiology: Recent Results (from the past 240 hour(s))  Resp Panel by RT-PCR (Flu A&B, Covid) Nasopharyngeal Swab     Status: None   Collection Time: 11/25/20  2:23 PM   Specimen: Nasopharyngeal Swab; Nasopharyngeal(NP) swabs in vial transport medium  Result Value Ref Range Status   SARS Coronavirus 2 by RT PCR NEGATIVE NEGATIVE Final    Comment: (NOTE) SARS-CoV-2 target nucleic acids are NOT DETECTED.  The SARS-CoV-2 RNA is generally detectable in upper respiratory specimens during the acute phase of infection. The lowest concentration of SARS-CoV-2 viral copies this assay can detect is 138 copies/mL. A negative result does not preclude SARS-Cov-2 infection and should not be used as the sole basis for treatment or other patient  management decisions. A negative result may occur with  improper specimen collection/handling, submission of specimen other than nasopharyngeal swab, presence of viral mutation(s) within the areas targeted by this assay, and inadequate number of viral copies(<138 copies/mL). A negative result must be combined with clinical observations, patient history, and epidemiological information. The expected result is Negative.  Fact Sheet for Patients:  EntrepreneurPulse.com.au  Fact Sheet for Healthcare Providers:  IncredibleEmployment.be  This test is no t yet approved or cleared by the [place] FDA and  has been authorized for detection and/or diagnosis of SARS-CoV-2 by FDA under an Emergency Use  Authorization (EUA). This EUA will remain  in effect (meaning this test can be used) for the duration of the COVID-19 declaration under Section 564(b)(1) of the Act, 21 U.S.C.section 360bbb-3(b)(1), unless the authorization is terminated  or revoked sooner.       Influenza A by PCR NEGATIVE NEGATIVE Final   Influenza B by PCR NEGATIVE NEGATIVE Final    Comment: (NOTE) The Xpert Xpress SARS-CoV-2/FLU/RSV plus assay is intended as an aid in the diagnosis of influenza from Nasopharyngeal swab specimens and should not be used as a sole basis for treatment. Nasal washings and aspirates are unacceptable for Xpert Xpress SARS-CoV-2/FLU/RSV testing.  Fact Sheet for Patients: EntrepreneurPulse.com.au  Fact Sheet for Healthcare Providers: IncredibleEmployment.be  This test is not yet approved or cleared by the [place] FDA and has been authorized for detection and/or diagnosis of SARS-CoV-2 by FDA under an Emergency Use Authorization (EUA). This EUA will remain in effect (meaning this test can be used) for the duration of the COVID-19 declaration under Section 564(b)(1) of the Act, 21 U.S.C. section 360bbb-3(b)(1), unless the authorization is terminated or revoked.  Performed at [facility], [address] [place], [place] [address]   Blood Culture (routine x 2)     Status: None (Preliminary result)   Collection Time: 11/25/20  2:23 PM   Specimen: BLOOD  Result Value Ref Range Status   Specimen Description BLOOD RIGHT ANTECUBITAL  Final   Special Requests   Final    BOTTLES DRAWN AEROBIC AND ANAEROBIC Blood Culture results may not be optimal due to an inadequate volume of blood received in culture bottles   Culture   Final    NO GROWTH 2 DAYS Performed at [facility], [address], [place], [place] [address]    Report Status PENDING  Incomplete  Urine culture     Status: Abnormal   Collection Time: 11/25/20  4:17 PM    Specimen: Urine, Random  Result Value Ref Range Status   Specimen Description   Final    URINE, RANDOM Performed at [facility], [address], [place], [place] [address]    Special Requests   Final    NONE Performed at [facility], [address], [place], [place] [address]    Culture (A)  Final    <10,000 COLONIES/mL INSIGNIFICANT GROWTH Performed at [facility], [place] [address] [place], [place] [address]    Report Status 11/27/2020 FINAL  Final  MRSA PCR Screening     Status: None   Collection Time: 11/25/20  8:21 PM   Specimen: Nasal Mucosa; Nasopharyngeal  Result Value Ref Range Status   MRSA by PCR NEGATIVE NEGATIVE Final    Comment:        The GeneXpert MRSA Assay (FDA approved for NASAL specimens only), is one component of a comprehensive MRSA colonization surveillance program. It is not  intended to diagnose MRSA infection nor to guide or monitor treatment for MRSA infections. Performed at [facility], [address] [place], [place] [address]   Culture, blood (Routine X 2) w Reflex to ID Panel     Status: None (Preliminary result)   Collection Time: 11/26/20  7:12 AM   Specimen: BLOOD  Result Value Ref Range Status   Specimen Description BLOOD RIGHT ARM  Final   Special Requests   Final    BOTTLES DRAWN AEROBIC AND ANAEROBIC Blood Culture adequate volume   Culture   Final    NO GROWTH < 24 HOURS Performed at [facility], [address] [place], [place] [address]    Report Status PENDING  Incomplete     Labs: BNP (last 3 results) Recent Labs    12/22/19 1136 04/06/20 1140 09/20/20 0513  BNP 146.0* 18.9 99.2   Basic Metabolic Panel: Recent Labs  Lab 11/25/20 1423 11/26/20 0712 11/27/20 0443  NA 134* 135 133*  K 3.9 4.3 4.2  CL 99 102 101  CO2 _0 GLUCOSE 97 110* 108*  BUN _1 CREATININE 1.04 0.76 0.94  CALCIUM 8.0* 8.2* 8.9   Liver Function Tests: Recent Labs  Lab  11/25/20 1423 11/26/20 0712  AST 30 29  ALT 15 15  ALKPHOS 145* 140*  BILITOT 4.1* 3.7*  PROT 7.0 6.8  ALBUMIN 2.8* 2.5*   No results for input(s): LIPASE, AMYLASE in the last 168 hours. No results for input(s): AMMONIA in the last 168 hours. CBC: Recent Labs  Lab 11/25/20 1423 11/26/20 0712 11/27/20 0443  WBC 13.4* 16.1* 15.1*  NEUTROABS 10.3*  --   --   HGB 15.6 15.2 15.8  HCT 47.4 47.8 48.8  MCV 98.8 103.2* 101.9*  PLT 152 136* 130*   Cardiac Enzymes: No results for input(s): CKTOTAL, CKMB, CKMBINDEX, TROPONINI in the last 168 hours. BNP: Invalid input(s): POCBNP CBG: Recent Labs  Lab 11/25/20 2006  GLUCAP 99   D-Dimer No results for input(s): DDIMER in the last 72 hours. Hgb A1c No results for input(s): HGBA1C in the last 72 hours. Lipid Profile No results for input(s): CHOL, HDL, LDLCALC, TRIG, CHOLHDL, LDLDIRECT in the last 72 hours. Thyroid function studies Recent Labs    11/25/20 1423  TSH 4.917*   Anemia work up No results for input(s): VITAMINB12, FOLATE, FERRITIN, TIBC, IRON, RETICCTPCT in the last 72 hours. Urinalysis    Component Value Date/Time   COLORURINE AMBER (A) 11/25/2020 1617   APPEARANCEUR CLOUDY (A) 11/25/2020 1617   APPEARANCEUR Cloudy (A) 04/06/2020 1140   LABSPEC 1.030 11/25/2020 1617   PHURINE 5.0 11/25/2020 1617   GLUCOSEU NEGATIVE 11/25/2020 1617   HGBUR LARGE (A) 11/25/2020 1617   BILIRUBINUR NEGATIVE 11/25/2020 1617   BILIRUBINUR Negative 04/06/2020 1140   KETONESUR NEGATIVE 11/25/2020 1617   PROTEINUR 30 (A) 11/25/2020 1617   NITRITE NEGATIVE 11/25/2020 1617   LEUKOCYTESUR LARGE (A) 11/25/2020 1617   Sepsis Labs Invalid input(s): PROCALCITONIN,  WBC,  LACTICIDVEN Microbiology Recent Results (from the past 240 hour(s))  Resp Panel by RT-PCR (Flu A&B, Covid) Nasopharyngeal Swab     Status: None   Collection Time: 11/25/20  2:23 PM   Specimen: Nasopharyngeal Swab; Nasopharyngeal(NP) swabs in vial transport medium   Result Value Ref Range Status   SARS Coronavirus 2 by RT PCR NEGATIVE NEGATIVE Final    Comment: (NOTE) SARS-CoV-2 target nucleic acids are NOT DETECTED.  The SARS-CoV-2 RNA is generally detectable  in upper respiratory specimens during the acute phase of infection. The lowest concentration of SARS-CoV-2 viral copies this assay can detect is 138 copies/mL. A negative result does not preclude SARS-Cov-2 infection and should not be used as the sole basis for treatment or other patient management decisions. A negative result may occur with  improper specimen collection/handling, submission of specimen other than nasopharyngeal swab, presence of viral mutation(s) within the areas targeted by this assay, and inadequate number of viral copies(<138 copies/mL). A negative result must be combined with clinical observations, patient history, and epidemiological information. The expected result is Negative.  Fact Sheet for Patients:  EntrepreneurPulse.com.au  Fact Sheet for Healthcare Providers:  IncredibleEmployment.be  This test is no t yet approved or cleared by the [place] FDA and  has been authorized for detection and/or diagnosis of SARS-CoV-2 by FDA under an Emergency Use Authorization (EUA). This EUA will remain  in effect (meaning this test can be used) for the duration of the COVID-19 declaration under Section 564(b)(1) of the Act, 21 U.S.C.section 360bbb-3(b)(1), unless the authorization is terminated  or revoked sooner.       Influenza A by PCR NEGATIVE NEGATIVE Final   Influenza B by PCR NEGATIVE NEGATIVE Final    Comment: (NOTE) The Xpert Xpress SARS-CoV-2/FLU/RSV plus assay is intended as an aid in the diagnosis of influenza from Nasopharyngeal swab specimens and should not be used as a sole basis for treatment. Nasal washings and aspirates are unacceptable for Xpert Xpress SARS-CoV-2/FLU/RSV testing.  Fact Sheet for  Patients: EntrepreneurPulse.com.au  Fact Sheet for Healthcare Providers: IncredibleEmployment.be  This test is not yet approved or cleared by the [place] FDA and has been authorized for detection and/or diagnosis of SARS-CoV-2 by FDA under an Emergency Use Authorization (EUA). This EUA will remain in effect (meaning this test can be used) for the duration of the COVID-19 declaration under Section 564(b)(1) of the Act, 21 U.S.C. section 360bbb-3(b)(1), unless the authorization is terminated or revoked.  Performed at [facility], [address] [place], [place] [address]   Blood Culture (routine x 2)     Status: None (Preliminary result)   Collection Time: 11/25/20  2:23 PM   Specimen: BLOOD  Result Value Ref Range Status   Specimen Description BLOOD RIGHT ANTECUBITAL  Final   Special Requests   Final    BOTTLES DRAWN AEROBIC AND ANAEROBIC Blood Culture results may not be optimal due to an inadequate volume of blood received in culture bottles   Culture   Final    NO GROWTH 2 DAYS Performed at [facility], [address], [place], [place] [id number]    Report Status PENDING  Incomplete  Urine culture     Status: Abnormal   Collection Time: 11/25/20  4:17 PM   Specimen: Urine, Random  Result Value Ref Range Status   Specimen Description   Final    URINE, RANDOM Performed at [facility], [address] [place], [place] [address]    Special Requests   Final    NONE Performed at [facility], [address], [place], [place] [address]    Culture (A)  Final    <10,000 COLONIES/mL INSIGNIFICANT GROWTH Performed at [facility], [place] [address], [place], [place] [address]    Report Status 11/27/2020 FINAL  Final  MRSA PCR Screening     Status: None   Collection Time: 11/25/20  8:21 PM   Specimen: Nasal Mucosa; Nasopharyngeal  Result Value  Ref Range Status   MRSA by PCR NEGATIVE NEGATIVE  Final    Comment:        The GeneXpert MRSA Assay (FDA approved for NASAL specimens only), is one component of a comprehensive MRSA colonization surveillance program. It is not intended to diagnose MRSA infection nor to guide or monitor treatment for MRSA infections. Performed at [facility], [address] [place], [place] [address]   Culture, blood (Routine X 2) w Reflex to ID Panel     Status: None (Preliminary result)   Collection Time: 11/26/20  7:12 AM   Specimen: BLOOD  Result Value Ref Range Status   Specimen Description BLOOD RIGHT ARM  Final   Special Requests   Final    BOTTLES DRAWN AEROBIC AND ANAEROBIC Blood Culture adequate volume   Culture   Final    NO GROWTH < 24 HOURS Performed at [facility], [address], [place], [place] [address]    Report Status PENDING  Incomplete    Time coordinating discharge: Over 30 minutes  SIGNED:  Lorella Nimrod, MD  [facility] 11/27/2020, 11:17 AM  If 7PM-7AM, please contact night-coverage [email or web address]  This record has been created using Systems analyst. Errors have been sought and corrected,but may not always be located. Such creation errors do not reflect on the standard of care.

## 2020-11-29 ENCOUNTER — Encounter (INDEPENDENT_AMBULATORY_CARE_PROVIDER_SITE_OTHER): Payer: Self-pay

## 2020-11-30 LAB — CULTURE, BLOOD (ROUTINE X 2): Culture: NO GROWTH

## 2020-12-01 ENCOUNTER — Telehealth: Payer: Self-pay

## 2020-12-01 LAB — CULTURE, BLOOD (ROUTINE X 2)
Culture: NO GROWTH
Special Requests: ADEQUATE

## 2020-12-01 NOTE — Telephone Encounter (Signed)
Called pt to scehdule fu appt with Lanora Manis. Pt did not answer and mailbox is not set up. Scheduled appt for pt on 4/20 but pt still needs to be made aware of date.

## 2020-12-06 ENCOUNTER — Encounter (INDEPENDENT_AMBULATORY_CARE_PROVIDER_SITE_OTHER): Payer: Self-pay

## 2020-12-09 ENCOUNTER — Telehealth (INDEPENDENT_AMBULATORY_CARE_PROVIDER_SITE_OTHER): Payer: Self-pay | Admitting: Nurse Practitioner

## 2020-12-09 NOTE — Telephone Encounter (Signed)
Called patient to reschedule April 12th appointment as Matthew [name] will be out of office. Couldn't leave VM.  Will try back again.  Appointment will be cancelled.

## 2020-12-12 NOTE — Progress Notes (Deleted)
Patient ID: Matthew [name], male    DOB: 12-19-1970, 50 y.o.   MRN: [id number]  HPI  Mr Bain is a 50 y/o male with a history of atrial fibrillation, sleep apnea, PE, DVT, COPD, current tobacco/ alcohol use and chronic heart failure.   Echo report from 12/24/19 reviewed and showed an EF of 45-50% along with mild LVH/ LAE and mildly elevated PA pressure.   Admitted 11/25/20 due to fever and chills due to cystitis. CTA negative for PE. Initially given IV antibiotics with transition to oral medications. Blood and urine cultures were negative. Discharged after 2 days. Was in the ED 11/09/20 due to leg edema and pain. Has also noted weeping of fluids from his legs. Given IV lasix with improvement of symptoms. Treated with antibiotic for possible beginning cellulitis and he was released.   He presents today for a follow-up visit with a chief complaint of   Past Medical History:  Diagnosis Date  . Arrhythmia    atrial fibrillation  . Arthritis   . CHF (congestive heart failure) (HCC)   . COPD (chronic obstructive pulmonary disease) (HCC)   . Dvt femoral (deep venous thrombosis) (HCC)   . Hx of blood clots   . Pulmonary embolus (HCC)   . Sleep apnea    patient states not anymore   Past Surgical History:  Procedure Laterality Date  . IVC FILTER INSERTION Left 11/24/2018   Procedure: IVC FILTER INSERTION;  Surgeon: Annice Needy, MD;  Location: [facility];  Service: Cardiovascular;  Laterality: Left;  . IVC FILTER INSERTION Right 02/18/2019   Procedure: IVC FILTER INSERTION WITH RIGHT LOWER EXTREMITY VENOUS LYSIS;  Surgeon: Renford Dills, MD;  Location: [facility];  Service: Cardiovascular;  Laterality: Right;  . IVC FILTER REMOVAL N/A 12/01/2018   Procedure: IVC FILTER REMOVAL;  Surgeon: Annice Needy, MD;  Location: [facility];  Service: Cardiovascular;  Laterality: N/A;  . NO PAST SURGERIES    . PERIPHERAL VASCULAR THROMBECTOMY Right 10/30/2019    Procedure: PERIPHERAL VASCULAR THROMBECTOMY / THROMBOLYSIS;  Surgeon: Annice Needy, MD;  Location: [facility];  Service: Cardiovascular;  Laterality: Right;  . PERIPHERAL VASCULAR THROMBECTOMY Right 11/05/2019   Procedure: PERIPHERAL VASCULAR THROMBECTOMY / THROMBOLYSIS;  Surgeon: Annice Needy, MD;  Location: [facility];  Service: Cardiovascular;  Laterality: Right;   No family history on file. Social History   Tobacco Use  . Smoking status: Current Every Day Smoker    Packs/day: 0.50    Types: Cigarettes  . Smokeless tobacco: Current User  Substance Use Topics  . Alcohol use: Yes    Comment: weekends says every third weekend a 12 pack   Allergies  Allergen Reactions  . Amoxicillin Rash     Review of Systems  Constitutional: Positive for appetite change (decreased) and fatigue.  HENT: Negative for congestion, postnasal drip and sore throat.   Eyes: Negative.   Respiratory: Positive for shortness of breath (with moderate exertion). Negative for cough and chest tightness.   Cardiovascular: Positive for leg swelling. Negative for chest pain and palpitations.  Gastrointestinal: Negative for abdominal distention and abdominal pain.  Endocrine: Negative.   Genitourinary: Positive for hematuria (at times).  Musculoskeletal: Positive for arthralgias (left sided rib pain). Negative for back pain.  Skin: Positive for wound (open wounds on both shins).  Allergic/Immunologic: Negative.   Neurological: Negative for dizziness and light-headedness.  Hematological: Negative for adenopathy. Does not bruise/bleed easily.  Psychiatric/Behavioral: Negative for dysphoric mood and sleep disturbance (wearing CPAP nightly). The patient is not nervous/anxious.    Physical Exam Vitals and nursing note reviewed.  Constitutional:      Appearance: He is well-developed.  HENT:     Head: Normocephalic and atraumatic.  Neck:     Vascular: No JVD.  Cardiovascular:     Rate and Rhythm:  Normal rate and regular rhythm.  Pulmonary:     Effort: Pulmonary effort is normal.     Breath sounds: No wheezing or rales.  Abdominal:     Palpations: Abdomen is soft.     Tenderness: There is no abdominal tenderness.  Musculoskeletal:     Cervical back: Neck supple.     Right lower leg: Edema (2+ pitting; clear fluids weeping) present.     Left lower leg: Edema (2+ pitting) present.  Skin:    General: Skin is warm and dry.     Findings: Wound (~ 2 inch open wound right anterior shin;  2 smaller open wounds left anterior shin) present.  Neurological:     General: No focal deficit present.     Mental Status: He is alert and oriented to person, place, and time.  Psychiatric:        Mood and Affect: Mood is anxious.        Behavior: Behavior normal.    Assessment & Plan:  1: Chronic heart failure with mildly reduced ejection fraction- - NYHA class II - euvolemic today - weighing daily; reminded to call for an overnight weight gain of > 2 pounds or a weekly weight gain >5 pounds - weight 285.4 pounds from last visit here 3 weeks ago - denies adding salt but says that during his workday, he may just eat a bag of chips or pack of nabs - would like to add GDMT of entresto, carvedilol, spironolactone and SGLT2; this was discussed but patient arrived to his appointment 20  minutes late & needs to get to vascular appointment - he mentions that he doesn't like to take much time off from work - BNP 09/20/20 was 45.4  2: Atrial fibrillation- - regular rhythm today - currently without insurance so doesn't see cardiology  3: COPD/ tobacco use- - saw PCP @ [facility] 03/30/20; says that he's planning on establishing care at [facility] 11/27/20 reviewed and showed sodium 133, potassium 4.2, creatinine 0.94 and GFR >60 - smoking ~ 1/2 ppd of cigarettes - drinks ~ 40 ounces of beer daily; says that he's "cut down a lot"  4: PE/DVT- - saw vascular Manson Passey)  11/22/20 - currently taking rivaroxaban 20mg  daily  5: Lymphedema- - stage 2 - not wearing compression socks as legs are currently weeping - admits to not elevating his legs much; is unable to at work and gets busy at home so doesn't elevate them at home - encouraged him to try and elevate them for even 15 minutes when he gets home from work    Patient did not bring his medications nor a list. Each medication was verbally reviewed with the patient and he was encouraged to bring the bottles to every visit to confirm accuracy of list.

## 2020-12-13 ENCOUNTER — Ambulatory Visit: Payer: Self-pay | Admitting: Family

## 2020-12-13 ENCOUNTER — Ambulatory Visit (INDEPENDENT_AMBULATORY_CARE_PROVIDER_SITE_OTHER): Payer: Self-pay | Admitting: Nurse Practitioner

## 2020-12-15 ENCOUNTER — Ambulatory Visit: Payer: Self-pay

## 2020-12-20 ENCOUNTER — Other Ambulatory Visit: Payer: Self-pay

## 2020-12-20 ENCOUNTER — Other Ambulatory Visit (INDEPENDENT_AMBULATORY_CARE_PROVIDER_SITE_OTHER): Payer: Self-pay | Admitting: Nurse Practitioner

## 2020-12-20 ENCOUNTER — Encounter (INDEPENDENT_AMBULATORY_CARE_PROVIDER_SITE_OTHER): Payer: Self-pay | Admitting: Nurse Practitioner

## 2020-12-20 ENCOUNTER — Ambulatory Visit (INDEPENDENT_AMBULATORY_CARE_PROVIDER_SITE_OTHER): Payer: Self-pay | Admitting: Nurse Practitioner

## 2020-12-20 VITALS — BP 116/76 | HR 60 | Ht 67.0 in | Wt 290.0 lb

## 2020-12-20 DIAGNOSIS — I83009 Varicose veins of unspecified lower extremity with ulcer of unspecified site: Secondary | ICD-10-CM

## 2020-12-20 DIAGNOSIS — J441 Chronic obstructive pulmonary disease with (acute) exacerbation: Secondary | ICD-10-CM

## 2020-12-20 DIAGNOSIS — Z720 Tobacco use: Secondary | ICD-10-CM

## 2020-12-20 DIAGNOSIS — L97909 Non-pressure chronic ulcer of unspecified part of unspecified lower leg with unspecified severity: Secondary | ICD-10-CM

## 2020-12-21 ENCOUNTER — Ambulatory Visit: Payer: Self-pay | Admitting: Gerontology

## 2020-12-26 ENCOUNTER — Encounter (INDEPENDENT_AMBULATORY_CARE_PROVIDER_SITE_OTHER): Payer: Self-pay | Admitting: Nurse Practitioner

## 2020-12-26 LAB — AEROBIC CULTURE

## 2020-12-26 NOTE — Progress Notes (Signed)
Subjective:    Patient ID: Matthew [name], male    DOB: [birth year], 50 y.o.   MRN: [id number] Chief Complaint  Patient presents with  . Follow-up    4 wk unna boot check    Patient is seen for follow up evaluation of leg pain and swelling associated with venous ulceration. The patient was recently seen here and started on Unna boot therapy.  The swelling abruptly became much worse bilaterally and is associated with pain and discoloration. The pain and swelling worsens with prolonged dependency and improves with elevation.  The patient notes that in the morning the legs are better but the leg symptoms worsened throughout the course of the day. The patient has also noted a progressive worsening of the discoloration in the ankle and shin area.   The patient notes that an ulcer has developed acutely without specific trauma and since it occurred it has been very slow to heal.  There is a moderate amount of drainage associated with the open area.  The wound is also very painful.   The patient states that they have been elevating as much as possible. The patient denies any recent changes in medications.  The patient denies a history of DVT or PE. There is no prior history of phlebitis. There is no history of primary lymphedema.  No SOB or increased cough.  No sputum production.  Recent episodes of CHF exacerbation.   Review of Systems  Cardiovascular: Positive for leg swelling.  Skin: Positive for wound.  All other systems reviewed and are negative.      Objective:   Physical Exam Vitals reviewed.  HENT:     Head: Normocephalic.  Cardiovascular:     Rate and Rhythm: Normal rate.     Pulses: Normal pulses.  Pulmonary:     Effort: Pulmonary effort is normal.  Musculoskeletal:     Right lower leg: 3+ Edema present.     Left lower leg: 3+ Edema present.  Skin:    Comments: Multiple ulcerations bilaterally  Neurological:     Mental Status: He is alert and oriented to person,  place, and time.  Psychiatric:        Mood and Affect: Mood normal.        Behavior: Behavior normal.        Thought Content: Thought content normal.        Judgment: Judgment normal.     BP 116/76   Pulse 60   Ht 5\' 7"  (1.702 m)   Wt 290 lb (131.5 kg)   BMI 45.42 kg/m   Past Medical History:  Diagnosis Date  . Arrhythmia    atrial fibrillation  . Arthritis   . CHF (congestive heart failure) (HCC)   . COPD (chronic obstructive pulmonary disease) (HCC)   . Dvt femoral (deep venous thrombosis) (HCC)   . Hx of blood clots   . Pulmonary embolus (HCC)   . Sleep apnea    patient states not anymore    Social History   Socioeconomic History  . Marital status: Divorced    Spouse name:  . Number of children: 4  . Years of education: Not on file  . Highest education level: Not on file  Occupational History  . Not on file  Tobacco Use  . Smoking status: Current Every Day Smoker    Packs/day: 0.50    Types: Cigarettes  . Smokeless tobacco: Current User  Vaping Use  . Vaping Use: Former  Substance and  Sexual Activity  . Alcohol use: Yes    Comment: weekends says every third weekend a 12 pack  . Drug use: Never  . Sexual activity: Yes  Other Topics Concern  . Not on file  Social History Narrative  . Not on file   Social Determinants of Health   Financial Resource Strain: Not on file  Food Insecurity: Not on file  Transportation Needs: Not on file  Physical Activity: Not on file  Stress: Not on file  Social Connections: Not on file  Intimate Partner Violence: Not on file    Past Surgical History:  Procedure Laterality Date  . IVC FILTER INSERTION Left 11/24/2018   Procedure: IVC FILTER INSERTION;  Surgeon: Annice Needy, MD;  Location: [facility];  Service: Cardiovascular;  Laterality: Left;  . IVC FILTER INSERTION Right 02/18/2019   Procedure: IVC FILTER INSERTION WITH RIGHT LOWER EXTREMITY VENOUS LYSIS;  Surgeon: Renford Dills, MD;   Location: [facility];  Service: Cardiovascular;  Laterality: Right;  . IVC FILTER REMOVAL N/A 12/01/2018   Procedure: IVC FILTER REMOVAL;  Surgeon: Annice Needy, MD;  Location: [facility];  Service: Cardiovascular;  Laterality: N/A;  . NO PAST SURGERIES    . PERIPHERAL VASCULAR THROMBECTOMY Right 10/30/2019   Procedure: PERIPHERAL VASCULAR THROMBECTOMY / THROMBOLYSIS;  Surgeon: Annice Needy, MD;  Location: [facility];  Service: Cardiovascular;  Laterality: Right;  . PERIPHERAL VASCULAR THROMBECTOMY Right 11/05/2019   Procedure: PERIPHERAL VASCULAR THROMBECTOMY / THROMBOLYSIS;  Surgeon: Annice Needy, MD;  Location: [facility];  Service: Cardiovascular;  Laterality: Right;    History reviewed. No pertinent family history.  Allergies  Allergen Reactions  . Amoxicillin Rash    CBC Latest Ref Rng & Units 11/27/2020 11/26/2020 11/25/2020  WBC 4.0 - 10.5 K/uL 15.1(H) 16.1(H) 13.4(H)  Hemoglobin 13.0 - 17.0 g/dL 32.9 51.8 84.1  Hematocrit 39.0 - 52.0 % 48.8 47.8 47.4  Platelets 150 - 400 K/uL 130(L) 136(L) 152      CMP     Component Value Date/Time   NA 133 (L) 11/27/2020 0443   NA 144 04/06/2020 1140   K 4.2 11/27/2020 0443   CL 101 11/27/2020 0443   CO2 26 11/27/2020 0443   GLUCOSE 108 (H) 11/27/2020 0443   BUN 17 11/27/2020 0443   BUN 12 04/06/2020 1140   CREATININE 0.94 11/27/2020 0443   CALCIUM 8.9 11/27/2020 0443   PROT 6.8 11/26/2020 0712   PROT 7.7 04/06/2020 1140   ALBUMIN 2.5 (L) 11/26/2020 0712   ALBUMIN 3.8 (L) 04/06/2020 1140   AST 29 11/26/2020 0712   ALT 15 11/26/2020 0712   ALKPHOS 140 (H) 11/26/2020 0712   BILITOT 3.7 (H) 11/26/2020 0712   BILITOT 0.3 04/06/2020 1140   GFRNONAA >60 11/27/2020 0443   GFRAA 118 04/06/2020 1140     No results found.     Assessment & Plan:   1. Venous ulcer (HCC) No surgery or intervention at this point in time.    I have had a long discussion with the patient regarding venous  insufficiency and why it  causes symptoms, specifically venous ulceration . I have discussed with the patient the chronic skin changes that accompany venous insufficiency and the long term sequela such as infection and recurring  ulceration.  Patient will be placed in [employer] which will be changed weekly drainage permitting.  Due to drainage from wounds, we will culture to evaluate for possible  infection  In addition, behavioral modification including several periods of elevation of the lower extremities during the day will be continued. Achieving a position with the ankles at heart level was stressed to the patient  The patient is instructed to begin routine exercise, especially walking on a daily basis   The patient will follow up in 4 weeks to reassess the degree of swelling and the control that Unna therapy is offering.   The patient can be assessed for graduated compression stockings or wraps as well as a Lymph Pump once the ulcers are healed.   2. Tobacco abuse Smoking cessation was discussed, 3-10 minutes spent on this topic specifically  3. COPD with acute exacerbation (HCC) Continue pulmonary medications and aerosols as already ordered, these medications have been reviewed and there are no changes at this time.     Current Outpatient Medications on File Prior to Visit  Medication Sig Dispense Refill  . albuterol (VENTOLIN HFA) 108 (90 Base) MCG/ACT inhaler Inhale 2 puffs into the lungs every 6 (six) hours as needed for wheezing or shortness of breath. 18 g 0  . folic acid (FOLVITE) 1 MG tablet Take 1 tablet (1 mg total) by mouth daily. 90 tablet 1  . furosemide (LASIX) 20 MG tablet Take 1 tablet (20 mg total) by mouth daily. 90 tablet 3  . Multiple Vitamin (MULTIVITAMIN WITH MINERALS) TABS tablet Take 1 tablet by mouth daily. 90 tablet 0  . rivaroxaban (XARELTO) 20 MG TABS tablet Take 1 tablet (20 mg total) by mouth daily with supper. 30 tablet 3  . thiamine 100 MG tablet Take  1 tablet (100 mg total) by mouth daily. 90 tablet 0   No current facility-administered medications on file prior to visit.    There are no Patient Instructions on file for this visit. No follow-ups on file.   Georgiana Spinner, NP

## 2020-12-27 ENCOUNTER — Encounter (INDEPENDENT_AMBULATORY_CARE_PROVIDER_SITE_OTHER): Payer: Self-pay

## 2020-12-27 ENCOUNTER — Other Ambulatory Visit (INDEPENDENT_AMBULATORY_CARE_PROVIDER_SITE_OTHER): Payer: Self-pay | Admitting: Nurse Practitioner

## 2020-12-30 ENCOUNTER — Ambulatory Visit: Payer: Self-pay | Admitting: Family

## 2020-12-30 ENCOUNTER — Telehealth: Payer: Self-pay | Admitting: Family

## 2020-12-30 NOTE — Telephone Encounter (Signed)
Patient did not show for his [facility] appointment on 12/30/20. Will attempt to reschedule.  

## 2021-01-03 ENCOUNTER — Encounter (INDEPENDENT_AMBULATORY_CARE_PROVIDER_SITE_OTHER): Payer: Self-pay | Admitting: Vascular Surgery

## 2021-01-03 ENCOUNTER — Encounter (INDEPENDENT_AMBULATORY_CARE_PROVIDER_SITE_OTHER): Payer: Self-pay | Admitting: Nurse Practitioner

## 2021-01-03 ENCOUNTER — Encounter (INDEPENDENT_AMBULATORY_CARE_PROVIDER_SITE_OTHER): Payer: Self-pay

## 2021-01-04 ENCOUNTER — Encounter (INDEPENDENT_AMBULATORY_CARE_PROVIDER_SITE_OTHER): Payer: Self-pay | Admitting: Nurse Practitioner

## 2021-01-09 ENCOUNTER — Other Ambulatory Visit (INDEPENDENT_AMBULATORY_CARE_PROVIDER_SITE_OTHER): Payer: Self-pay | Admitting: Nurse Practitioner

## 2021-01-09 DIAGNOSIS — L97909 Non-pressure chronic ulcer of unspecified part of unspecified lower leg with unspecified severity: Secondary | ICD-10-CM

## 2021-01-10 ENCOUNTER — Encounter (INDEPENDENT_AMBULATORY_CARE_PROVIDER_SITE_OTHER): Payer: Self-pay

## 2021-01-10 ENCOUNTER — Ambulatory Visit (INDEPENDENT_AMBULATORY_CARE_PROVIDER_SITE_OTHER): Payer: Self-pay | Admitting: Nurse Practitioner

## 2021-01-17 ENCOUNTER — Emergency Department: Payer: Self-pay

## 2021-01-17 ENCOUNTER — Emergency Department
Admission: EM | Admit: 2021-01-17 | Discharge: 2021-01-17 | Disposition: A | Discharge status: Home / Self Care | Payer: Self-pay | Attending: Emergency Medicine | Admitting: Emergency Medicine

## 2021-01-17 ENCOUNTER — Other Ambulatory Visit: Payer: Self-pay

## 2021-01-17 DIAGNOSIS — R079 Chest pain, unspecified: Secondary | ICD-10-CM

## 2021-01-17 DIAGNOSIS — I5022 Chronic systolic (congestive) heart failure: Secondary | ICD-10-CM | POA: Insufficient documentation

## 2021-01-17 DIAGNOSIS — Z20822 Contact with and (suspected) exposure to covid-19: Secondary | ICD-10-CM | POA: Insufficient documentation

## 2021-01-17 DIAGNOSIS — I4891 Unspecified atrial fibrillation: Secondary | ICD-10-CM | POA: Insufficient documentation

## 2021-01-17 DIAGNOSIS — R0789 Other chest pain: Secondary | ICD-10-CM | POA: Insufficient documentation

## 2021-01-17 DIAGNOSIS — Z7901 Long term (current) use of anticoagulants: Secondary | ICD-10-CM | POA: Insufficient documentation

## 2021-01-17 DIAGNOSIS — S2242XK Multiple fractures of ribs, left side, subsequent encounter for fracture with nonunion: Secondary | ICD-10-CM

## 2021-01-17 DIAGNOSIS — J441 Chronic obstructive pulmonary disease with (acute) exacerbation: Secondary | ICD-10-CM | POA: Insufficient documentation

## 2021-01-17 DIAGNOSIS — F17210 Nicotine dependence, cigarettes, uncomplicated: Secondary | ICD-10-CM | POA: Insufficient documentation

## 2021-01-17 DIAGNOSIS — R2241 Localized swelling, mass and lump, right lower limb: Secondary | ICD-10-CM | POA: Insufficient documentation

## 2021-01-17 LAB — CBC
HCT: 47.7 % (ref 39.0–52.0)
Hemoglobin: 15.3 g/dL (ref 13.0–17.0)
MCH: 31.9 pg (ref 26.0–34.0)
MCHC: 32.1 g/dL (ref 30.0–36.0)
MCV: 99.6 fL (ref 80.0–100.0)
Platelets: 168 10*3/uL (ref 150–400)
RBC: 4.79 MIL/uL (ref 4.22–5.81)
RDW: 16.1 % — ABNORMAL HIGH (ref 11.5–15.5)
WBC: 9.4 10*3/uL (ref 4.0–10.5)
nRBC: 0.3 % — ABNORMAL HIGH (ref 0.0–0.2)

## 2021-01-17 LAB — COMPREHENSIVE METABOLIC PANEL
ALT: 17 U/L (ref 0–44)
AST: 32 U/L (ref 15–41)
Albumin: 2.9 g/dL — ABNORMAL LOW (ref 3.5–5.0)
Alkaline Phosphatase: 188 U/L — ABNORMAL HIGH (ref 38–126)
Anion gap: 8 (ref 5–15)
BUN: 10 mg/dL (ref 6–20)
CO2: 31 mmol/L (ref 22–32)
Calcium: 8.6 mg/dL — ABNORMAL LOW (ref 8.9–10.3)
Chloride: 95 mmol/L — ABNORMAL LOW (ref 98–111)
Creatinine, Ser: 0.72 mg/dL (ref 0.61–1.24)
GFR, Estimated: >60 mL/min (ref >60)
Glucose, Bld: 92 mg/dL (ref 70–99)
Potassium: 4.1 mmol/L (ref 3.5–5.1)
Sodium: 134 mmol/L — ABNORMAL LOW (ref 135–145)
Total Bilirubin: 2 mg/dL — ABNORMAL HIGH (ref 0.3–1.2)
Total Protein: 7.8 g/dL (ref 6.5–8.1)

## 2021-01-17 LAB — BRAIN NATRIURETIC PEPTIDE: B Natriuretic Peptide: 157.9 pg/mL — ABNORMAL HIGH (ref 0.0–100.0)

## 2021-01-17 LAB — D-DIMER, QUANTITATIVE: D-Dimer, Quant: 2.09 ug/mL-FEU — ABNORMAL HIGH (ref 0.00–0.50)

## 2021-01-17 LAB — TROPONIN I (HIGH SENSITIVITY)
Troponin I (High Sensitivity): 21 ng/L — ABNORMAL HIGH (ref <18)
Troponin I (High Sensitivity): 17 ng/L (ref <18)

## 2021-01-17 LAB — RESP PANEL BY RT-PCR (FLU A&B, COVID) ARPGX2
Influenza A by PCR: NEGATIVE
Influenza B by PCR: NEGATIVE
SARS Coronavirus 2 by RT PCR: NEGATIVE

## 2021-01-17 MED ORDER — IOHEXOL 350 MG/ML SOLN
75.0000 mL | Freq: Once | INTRAVENOUS | Status: AC | PRN
  Administered 2021-01-17: 75 mL via INTRAVENOUS

## 2021-01-17 NOTE — ED Provider Notes (Signed)
[facility] Emergency Department Provider Note  Time seen: 1:56 PM  I have reviewed the triage vital signs and the nursing notes.   HISTORY  Chief Complaint Chest Pain   HPI Matthew [name] is a 50 y.o. male with a past medical history of CHF, COPD, DVT/PE on Xarelto, presents to the emergency department for left chest pain.  According to the patient since this morning he has been experiencing intermittent pain in his left chest he describes as mild and sharp.  Denies any worsening with movement or deep inspiration.  Patient states some nausea today but denies any diaphoresis or dyspnea.   Patient states a history of a PE in 2013 as well as DVTs currently taking Xarelto but states he has not been taking it every day as he should.  Patient has significant lower extremity edema as well as a slowly healing ulcer being followed by vascular surgery.  Past Medical History:  Diagnosis Date  . Arrhythmia    atrial fibrillation  . Arthritis   . CHF (congestive heart failure) (HCC)   . COPD (chronic obstructive pulmonary disease) (HCC)   . Dvt femoral (deep venous thrombosis) (HCC)   . Hx of blood clots   . Pulmonary embolus (HCC)   . Sleep apnea    patient states not anymore    Patient Active Problem List   Diagnosis Date Noted  . Sepsis associated hypotension (HCC) 11/25/2020  . Chronic systolic CHF (congestive heart failure) (HCC) 09/20/2020  . Elevated troponin 09/20/2020  . Wound of right leg, initial encounter 09/20/2020  . COPD exacerbation (HCC)   . Bilateral edema of lower extremity 03/30/2020  . History of sleep apnea 03/30/2020  . Hospital discharge follow-up 01/07/2020  . Shortness of breath 12/22/2019  . Pulmonary embolism (HCC) 12/22/2019  . Atrial fibrillation with RVR (HCC) 12/22/2019  . Acute pulmonary embolism (HCC) 12/22/2019  . Non compliance w medication regimen 12/22/2019  . COPD (chronic obstructive pulmonary disease) (HCC) 12/19/2019   . Sepsis (HCC) 12/19/2019  . Community acquired pneumonia 12/19/2019  . Acute on chronic respiratory failure with hypoxia (HCC) 12/19/2019  . OSA (obstructive sleep apnea) 12/19/2019  . Venous ulcer (HCC) 12/09/2019  . COPD with acute exacerbation (HCC)   . Class 3 severe obesity without serious comorbidity with body mass index (BMI) of 40.0 to 44.9 in adult [facility])   . Other cirrhosis of liver (HCC)   . Hypomagnesemia   . Tobacco abuse 10/29/2019  . Prediabetes 04/25/2019  . Wheeze 04/25/2019  . Ingrown toenail of right foot 04/23/2019  . AF (paroxysmal atrial fibrillation) (HCC) 03/19/2019  . DVT (deep venous thrombosis) (HCC) 03/19/2019  . Bilateral pneumonia 03/11/2019  . Acute DVT (deep venous thrombosis) (HCC) 02/17/2019  . DVT of deep femoral vein, right (HCC) 11/19/2018    Past Surgical History:  Procedure Laterality Date  . IVC FILTER INSERTION Left 11/24/2018   Procedure: IVC FILTER INSERTION;  Surgeon: Annice Needy, MD;  Location: [facility] INVASIVE CV LAB;  Service: Cardiovascular;  Laterality: Left;  . IVC FILTER INSERTION Right 02/18/2019   Procedure: IVC FILTER INSERTION WITH RIGHT LOWER EXTREMITY VENOUS LYSIS;  Surgeon: Renford Dills, MD;  Location: [facility] INVASIVE CV LAB;  Service: Cardiovascular;  Laterality: Right;  . IVC FILTER REMOVAL N/A 12/01/2018   Procedure: IVC FILTER REMOVAL;  Surgeon: Annice Needy, MD;  Location: [facility] INVASIVE CV LAB;  Service: Cardiovascular;  Laterality: N/A;  . NO PAST SURGERIES    . PERIPHERAL  VASCULAR THROMBECTOMY Right 10/30/2019   Procedure: PERIPHERAL VASCULAR THROMBECTOMY / THROMBOLYSIS;  Surgeon: Annice Needy, MD;  Location: [facility] INVASIVE CV LAB;  Service: Cardiovascular;  Laterality: Right;  . PERIPHERAL VASCULAR THROMBECTOMY Right 11/05/2019   Procedure: PERIPHERAL VASCULAR THROMBECTOMY / THROMBOLYSIS;  Surgeon: Annice Needy, MD;  Location: [facility] INVASIVE CV LAB;  Service: Cardiovascular;  Laterality: Right;    Prior to Admission  medications   Medication Sig Start Date End Date Taking? Authorizing Provider  albuterol (VENTOLIN HFA) 108 (90 Base) MCG/ACT inhaler Inhale 2 puffs into the lungs every 6 (six) hours as needed for wheezing or shortness of breath. 11/06/19   Alford Highland, MD  cephALEXin (KEFLEX) 500 MG capsule TAKE ONE CAPSULE BY MOUTH FOUR TIMES A DAY FOR 3 DAYS 11/27/20 11/27/21  Arnetha Courser, MD  diltiazem (DILACOR XR) 120 MG 24 hr capsule TAKE ONE CAPSULE BY MOUTH EVERY DAY 06/28/20 03/02/21  Iloabachie, Chioma E, NP  folic acid (FOLVITE) 1 MG tablet Take 1 tablet (1 mg total) by mouth daily. 11/28/20   Arnetha Courser, MD  folic acid (FOLVITE) 1 MG tablet TAKE ONE TABLET BY MOUTH EVERY DAY 11/27/20 11/27/21  Arnetha Courser, MD  furosemide (LASIX) 20 MG tablet Take 1 tablet (20 mg total) by mouth daily. 11/18/20 02/16/21  Delma Freeze, FNP  furosemide (LASIX) 20 MG tablet TAKE ONE TABLET BY MOUTH EVERY DAY 11/18/20 11/18/21  Delma Freeze, FNP  Multiple Vitamin (MULTIVITAMIN WITH MINERALS) TABS tablet Take 1 tablet by mouth daily. 11/28/20   Arnetha Courser, MD  rivaroxaban (XARELTO) 20 MG TABS tablet Take 1 tablet (20 mg total) by mouth daily with supper. 10/12/20   Danford, Earl Lites, MD  rivaroxaban (XARELTO) 20 MG TABS tablet TAKE ONE TABLET BY MOUTH EVERY DAY WITH SUPPER 09/21/20 09/21/21  Danford, Earl Lites, MD  RIVAROXABAN Carlena Hurl) VTE STARTER PACK (15 & 20 MG) TAKE ACCORDING TO PACKAGE INSTRUCTIONS 09/21/20 09/21/21  Danford, Earl Lites, MD  thiamine (VITAMIN B-1) 100 MG tablet TAKE ONE TABLET BY MOUTH DAILY 11/27/20 11/27/21  Arnetha Courser, MD  thiamine 100 MG tablet Take 1 tablet (100 mg total) by mouth daily. 11/28/20   Arnetha Courser, MD    Allergies  Allergen Reactions  . Amoxicillin Rash    History reviewed. No pertinent family history.  Social History Social History   Tobacco Use  . Smoking status: Current Every Day Smoker    Packs/day: 0.50    Types: Cigarettes  . Smokeless tobacco:  Current User  Vaping Use  . Vaping Use: Former  Substance Use Topics  . Alcohol use: Yes    Comment: weekends says every third weekend a 12 pack  . Drug use: Never    Review of Systems Constitutional: Negative for fever.  Generalized weakness Cardiovascular: Left chest pain Respiratory: Negative for shortness of breath. Gastrointestinal: Negative for abdominal pain, vomiting Musculoskeletal: Lower extremity edema, chronic Skin: Ulceration right lower extremity being followed by vascular surgery Neurological: Negative for headache All other ROS negative  ____________________________________________   PHYSICAL EXAM:  VITAL SIGNS: ED Triage Vitals  Enc Vitals Group     BP 01/17/21 1337 108/81     Pulse Rate 01/17/21 1337 (!) 108     Resp 01/17/21 1337 20     Temp 01/17/21 1337 98.7 F (37.1 C)     Temp Source 01/17/21 1337 Axillary     SpO2 01/17/21 1337 93 %     Weight 01/17/21 1343 (!) 309 lb 1.4 oz (140.2 kg)  Height 01/17/21 1343 5\' 7"  (1.702 m)     Head Circumference --      Peak Flow --      Pain Score 01/17/21 1342 6     Pain Loc --      Pain Edu? --      Excl. in GC? --    Constitutional: Alert and oriented. Well appearing and in no distress. Eyes: Normal exam ENT      Head: Normocephalic and atraumatic.      Mouth/Throat: Mucous membranes are moist. Cardiovascular: Irregular rhythm rate around 100 bpm. Respiratory: Normal respiratory effort without tachypnea nor retractions. Breath sounds are clear without obvious wheeze rales or rhonchi Gastrointestinal: Soft and nontender. No distention.  Obese. Musculoskeletal: Bilateral lower extremity edema and tenderness which the patient states is chronic. Neurologic:  Normal speech and language. No gross focal neurologic deficits Skin:  Skin is warm.  Ulceration right lower extremity Psychiatric: Mood and affect are normal.   ____________________________________________    EKG  EKG viewed and interpreted  by myself shows atrial fibrillation at 90 bpm with a narrow QRS, left axis deviation, largely normal intervals with nonspecific but no concerning ST changes.  ____________________________________________    RADIOLOGY  Chest x-ray shows vascular congestion. CTA pending  ____________________________________________   INITIAL IMPRESSION / ASSESSMENT AND PLAN / ED COURSE  Pertinent labs & imaging results that were available during my care of the patient were reviewed by me and considered in my medical decision making (see chart for details).   Patient presents emergency department with complains of intermittent left chest pain since this morning also complaining of generalized fatigue and weakness.  Overall the patient appears chronically ill but not acutely ill.  No distress.  Patient satting 93% on room air we will check labs, chest x-ray.  Within the labs we will check cardiac enzymes as well as a D-dimer given the patient's history and anticoagulation noncompliance.  We will continue to closely monitor while awaiting results.  Patient's labs do show an elevated D-dimer as well as a slightly elevated BNP.  Chest x-ray shows cardiomegaly with vascular congestion but no edema.  We will proceed with a CTA of the chest to further evaluate.  Patient care signed out to oncoming provider.  Repeat troponin pending as well.  Matthew [name] was evaluated in Emergency Department on 01/17/2021 for the symptoms described in the history of present illness. He was evaluated in the context of the global COVID-19 pandemic, which necessitated consideration that the patient might be at risk for infection with the SARS-CoV-2 virus that causes COVID-19. Institutional protocols and algorithms that pertain to the evaluation of patients at risk for COVID-19 are in a state of rapid change based on information released by regulatory bodies including the CDC and federal and state organizations. These policies and  algorithms were followed during the patient's care in the ED.  ____________________________________________   FINAL CLINICAL IMPRESSION(S) / ED DIAGNOSES  Chest pain   01/19/2021, MD 01/17/21 1528

## 2021-01-17 NOTE — ED Notes (Signed)
D/C and reasons to return to ED discussed with pt, pt verbalized understanding. Pt also educated to take Xarletto as RX'ed, pt verbalized understanding.   Pt ambulatory with steady gait on D/C. VSS. NAD noted.

## 2021-01-17 NOTE — ED Notes (Addendum)
Pt presents to ED with c/o of "not feeling well" for the past 2 days. Pt states "some SOB and some chest pain" Pt states recently having cramps "everywhere". EMS presents with pt on NRB at 15L/min, pt does not wear 02 at home. Pt has a RA O2 sat of 93%. Pt presents in NAD. Pt denies weakness. Pt is A&Ox4.

## 2021-01-17 NOTE — Discharge Instructions (Signed)
Your lab tests and CT scan today were all okay.  Be sure to continue taking all of your medications as prescribed including the blood thinner (Xarelto) to make sure the previous blood clot in your lungs completely resolves and that you do not get a new blood clot.

## 2021-01-17 NOTE — ED Provider Notes (Signed)
Procedures     ----------------------------------------- 5:16 PM on 01/17/2021 ----------------------------------------- Repeat troponin normal.  CT angiogram negative for acute PE, shows chronic finding from previous PE.  There are 3 left-sided rib fractures, not acute.  Vital signs are stable, no evidence of pneumonia or pneumothorax or effusion or edema.  Stable for discharge home.  Counseled patient on compliance with her medications, noncompliance with diuretic therapy may be the cause of her symptoms.   Sharman Cheek, MD 01/17/21 [phone]

## 2021-01-24 ENCOUNTER — Telehealth: Payer: Self-pay | Admitting: Licensed Clinical Social Worker

## 2021-01-24 NOTE — Telephone Encounter (Signed)
Called patient to schedule in person appointment with Matthew Horn, NP at the [facility]. No Answer; Left voicemail with clinic contact information and instructions on how to schedule an appointment.

## 2021-01-26 NOTE — Telephone Encounter (Signed)
Spoke to patient he stated he did not want to see Hurman Horn, NP and requested a different provider who has availability in the evenings.

## 2021-02-03 ENCOUNTER — Ambulatory Visit: Payer: Self-pay | Admitting: Family

## 2021-02-03 ENCOUNTER — Telehealth: Payer: Self-pay | Admitting: Family

## 2021-02-03 NOTE — Telephone Encounter (Signed)
Patient did not show for his [facility] appointment on 02/03/21. Will attempt to reschedule.

## 2021-03-01 IMAGING — DX DG CHEST 1V PORT
1 series · 1 of 1 positions shown · non-contrast
Comparison: PA and lateral chest 12/19/2019 and 03/12/2019. CT
chest 12/20/2019.

CLINICAL DATA: Shortness of breath and decreased oxygen saturation
today.

EXAM:
PORTABLE CHEST 1 VIEW

[chest ap]
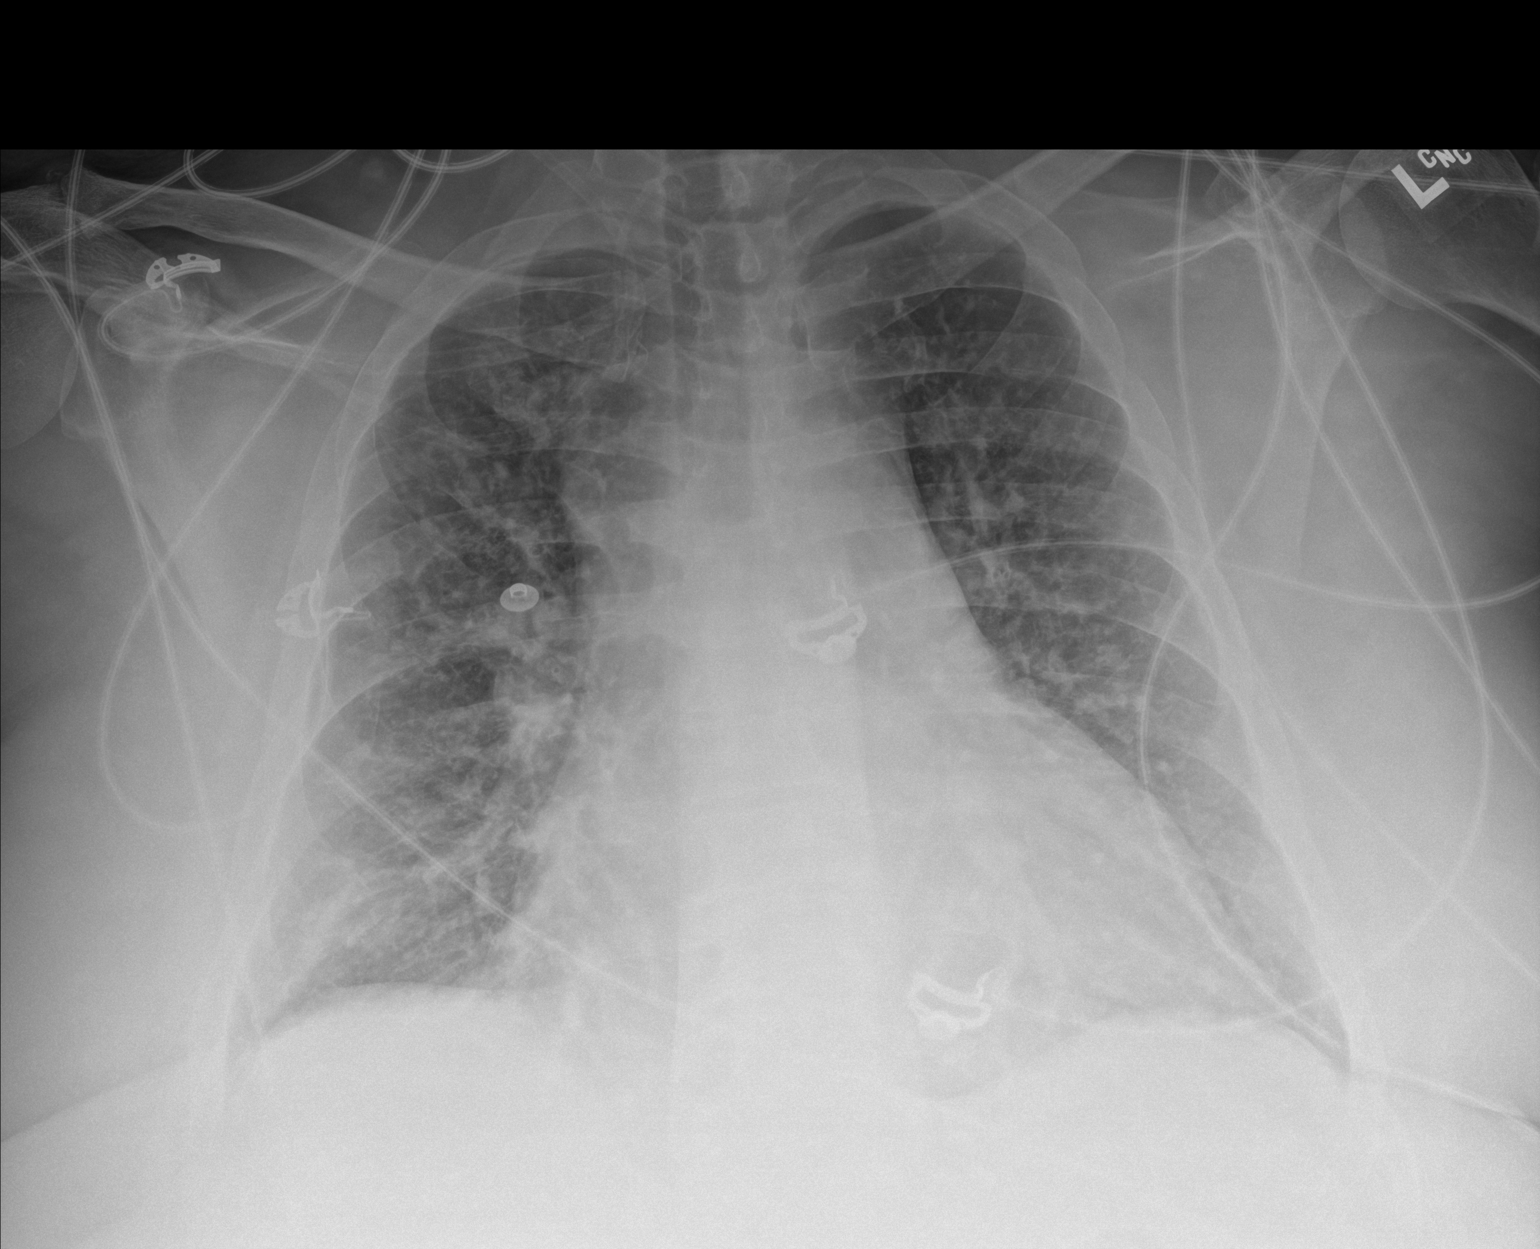

[1 of 1 positions shown; findings below may reference images not displayed]

FINDINGS: There is cardiomegaly and mild interstitial edema. No consolidative
process, pneumothorax or effusion. No acute or focal bony
abnormality.
IMPRESSION: Cardiomegaly and mild interstitial pulmonary edema.

## 2021-03-23 ENCOUNTER — Other Ambulatory Visit: Payer: Self-pay

## 2021-08-17 ENCOUNTER — Other Ambulatory Visit: Payer: Self-pay

## 2021-09-16 ENCOUNTER — Emergency Department
Admission: EM | Admit: 2021-09-16 | Discharge: 2021-09-16 | Disposition: A | Discharge status: Home / Self Care | Payer: 59 | Attending: Emergency Medicine | Admitting: Emergency Medicine

## 2021-09-16 ENCOUNTER — Encounter: Payer: Self-pay | Admitting: Emergency Medicine

## 2021-09-16 ENCOUNTER — Emergency Department: Payer: 59

## 2021-09-16 ENCOUNTER — Other Ambulatory Visit: Payer: Self-pay

## 2021-09-16 DIAGNOSIS — S52044A Nondisplaced fracture of coronoid process of right ulna, initial encounter for closed fracture: Secondary | ICD-10-CM | POA: Insufficient documentation

## 2021-09-16 DIAGNOSIS — W010XXA Fall on same level from slipping, tripping and stumbling without subsequent striking against object, initial encounter: Secondary | ICD-10-CM | POA: Insufficient documentation

## 2021-09-16 DIAGNOSIS — J449 Chronic obstructive pulmonary disease, unspecified: Secondary | ICD-10-CM | POA: Diagnosis not present

## 2021-09-16 DIAGNOSIS — I4891 Unspecified atrial fibrillation: Secondary | ICD-10-CM | POA: Insufficient documentation

## 2021-09-16 DIAGNOSIS — I509 Heart failure, unspecified: Secondary | ICD-10-CM | POA: Insufficient documentation

## 2021-09-16 DIAGNOSIS — S59901A Unspecified injury of right elbow, initial encounter: Secondary | ICD-10-CM | POA: Diagnosis present

## 2021-09-16 MED ORDER — ONDANSETRON 4 MG PO TBDP
4.0000 mg | ORAL_TABLET | Freq: Once | ORAL | Status: AC
  Administered 2021-09-16: 4 mg via ORAL
  Filled 2021-09-16: qty 1

## 2021-09-16 MED ORDER — OXYCODONE-ACETAMINOPHEN 7.5-325 MG PO TABS
1.0000 | ORAL_TABLET | Freq: Once | ORAL | Status: AC
  Administered 2021-09-16: 1 via ORAL
  Filled 2021-09-16: qty 1

## 2021-09-16 MED ORDER — ONDANSETRON 4 MG PO TBDP: 4.0000 mg | ORAL_TABLET | Freq: Three times a day (TID) | ORAL | 0 refills | Status: AC | PRN

## 2021-09-16 MED ORDER — OXYCODONE-ACETAMINOPHEN 7.5-325 MG PO TABS
1.0000 | ORAL_TABLET | Freq: Four times a day (QID) | ORAL | 0 refills | Status: AC | PRN
Start: 2021-09-16 — End: 2022-09-16

## 2021-09-16 NOTE — ED Provider Notes (Signed)
[facility] Provider Note    Event Date/Time   First MD Initiated Contact with Patient 09/16/21 1103     (approximate)   History   Elbow Injury   HPI  Matthew [name] is a 51 y.o. male   presents to the ED with complaint of right elbow pain.  Patient states that he slipped last night and landed on his right elbow.  Patient still went along with his planned activities last evening.  He states that during the night he woke up with increased pain especially with any range of motion of his elbow.  Patient and fianc both agree that there was no head injury or loss of consciousness with his fall.  He denies any other injuries.  Patient has history of CHF, COPD, PE and DVT, atrial fib with RVR.  His pain as 7 out of 10.      Physical Exam   Triage Vital Signs: ED Triage Vitals  Enc Vitals Group     BP 09/16/21 0948 (!) 122/96     Pulse Rate 09/16/21 0948 84     Resp 09/16/21 0948 18     Temp 09/16/21 0948 98.5 F (36.9 C)     Temp Source 09/16/21 0948 Oral     SpO2 09/16/21 0948 97 %     Weight 09/16/21 0857 (!) 309 lb 1.4 oz (140.2 kg)     Height 09/16/21 0857 5\' 7"  (1.702 m)     Head Circumference --      Peak Flow --      Pain Score 09/16/21 0857 7     Pain Loc --      Pain Edu? --      Excl. in GC? --     Most recent vital signs: Vitals:   09/16/21 0948  BP: (!) 122/96  Pulse: 84  Resp: 18  Temp: 98.5 F (36.9 C)  SpO2: 97%    General: Awake, no distress.  Alert, talkative, oriented x3. CV:  Good peripheral perfusion.  Lungs are clear bilaterally. Resp:  Normal effort.  Heart regular rate and rhythm without murmur. Abd:  No distention.  Soft nontender. Other:  Examination of the right upper extremity there is moderate tenderness on palpation of the lateral elbow area with soft tissue edema.  No deformity, abrasions or ecchymosis is present.  Radial pulses present, motor sensory function intact.  Patient is able to move digits and  wrist without difficulty.  Patient cannot extend right upper extremity fully secondary to pain in his right elbow.  No tenderness is noted on palpation of the right shoulder.  Patient is ambulatory without any assistance and denies any pain on palpation of the lower extremities.   ED Results / Procedures / Treatments   Labs (all labs ordered are listed, but only abnormal results are displayed) Labs Reviewed - No data to display    RADIOLOGY  X-rays of the right elbow were reviewed and radiology report noted.  Nondisplaced fracture of the proximal ulna coronoid process.   PROCEDURES:  Critical Care performed: No  Procedures Posterior long-arm OCL splint was applied along with sling by this provider  MEDICATIONS ORDERED IN ED: Medications  ondansetron (ZOFRAN-ODT) disintegrating tablet 4 mg (4 mg Oral Given 09/16/21 1138)  oxyCODONE-acetaminophen (PERCOCET) 7.5-325 MG per tablet 1 tablet (1 tablet Oral Given 09/16/21 1139)     IMPRESSION / MDM / ASSESSMENT AND PLAN / ED COURSE  I reviewed the triage vital signs and the  nursing notes.    Differential diagnosis includes, but is not limited to, fracture right olecranon, proximal radius, proximal ulna or dislocation.   52 year old male presents to the ED with complaint of right elbow pain after he slipped and fell last evening.  Patient and fianc states that there was no loss of consciousness as he did not hit his head.  Patient has history of CHF, COPD, DVT, PE, atrial fib with RVR and arthritis.  After reviewing x-ray patient was made aware and was shown that he does have a comminuted nondisplaced fracture of the right coronoid process of his right elbow.  A posterior OCL splint was applied and patient was placed in a sling.  He was given a prescription for Percocet and Zofran to take as needed.  He is instructed to ice and elevate today.  Information for Dr. Smiley Houseman office was given to him so that he has an orthopedist to  follow-up with.  Patient agrees with the plan and was discharge.       FINAL CLINICAL IMPRESSION(S) / ED DIAGNOSES   Final diagnoses:  Closed nondisplaced fracture of coronoid process of right ulna, initial encounter     Rx / DC Orders   ED Discharge Orders          Ordered    ondansetron (ZOFRAN-ODT) 4 MG disintegrating tablet  Every 8 hours PRN        09/16/21 1237    oxyCODONE-acetaminophen (PERCOCET) 7.5-325 MG tablet  Every 6 hours PRN        09/16/21 1237             Note:  This document was prepared using Dragon voice recognition software and may include unintentional dictation errors.   Matthew Rumps, PA-C 09/16/21 1406    Arnaldo Natal, MD 09/16/21 [phone]

## 2021-09-16 NOTE — ED Triage Notes (Signed)
Slipped last night, landing on right elbow.  C?O right elbow pain

## 2021-09-16 NOTE — Discharge Instructions (Signed)
Call make an appointment with Dr. Hyacinth Meeker who is the orthopedist on-call.  Ice and elevation to reduce swelling and help with pain.  Leave the splint on until you are seen by the orthopedist.  A prescription for pain medication and nausea was sent to the pharmacy.  Take only as directed.
